# Patient Record
Sex: Female | Born: 1935
Health system: Southern US, Community
[De-identification: ages and names within clinical notes are randomized; demographics above are authoritative.]

## PROBLEM LIST (undated history)

## (undated) DIAGNOSIS — F0390 Unspecified dementia without behavioral disturbance: Secondary | ICD-10-CM

## (undated) DIAGNOSIS — I219 Acute myocardial infarction, unspecified: Secondary | ICD-10-CM

## (undated) DIAGNOSIS — I10 Essential (primary) hypertension: Secondary | ICD-10-CM

## (undated) DIAGNOSIS — I2510 Atherosclerotic heart disease of native coronary artery without angina pectoris: Secondary | ICD-10-CM

## (undated) DIAGNOSIS — E0590 Thyrotoxicosis, unspecified without thyrotoxic crisis or storm: Secondary | ICD-10-CM

## (undated) DIAGNOSIS — E079 Disorder of thyroid, unspecified: Secondary | ICD-10-CM

## (undated) DIAGNOSIS — I509 Heart failure, unspecified: Secondary | ICD-10-CM

## (undated) HISTORY — PX: PARTIAL HYSTERECTOMY: SHX80

## (undated) HISTORY — DX: Essential (primary) hypertension: I10

## (undated) HISTORY — DX: Unspecified dementia, unspecified severity, without behavioral disturbance, psychotic disturbance, mood disturbance, and anxiety: F03.90

## (undated) HISTORY — PX: CHOLECYSTECTOMY: SHX55

## (undated) HISTORY — DX: Acute myocardial infarction, unspecified: I21.9

## (undated) HISTORY — DX: Heart failure, unspecified: I50.9

## (undated) HISTORY — DX: Atherosclerotic heart disease of native coronary artery without angina pectoris: I25.10

## (undated) HISTORY — DX: Disorder of thyroid, unspecified: E07.9

---

## 2006-07-29 ENCOUNTER — Ambulatory Visit: Payer: Self-pay | Admitting: Internal Medicine

## 2007-12-16 ENCOUNTER — Ambulatory Visit: Payer: Self-pay | Admitting: Family Medicine

## 2008-03-21 ENCOUNTER — Ambulatory Visit: Payer: Self-pay | Admitting: Internal Medicine

## 2014-02-06 ENCOUNTER — Inpatient Hospital Stay: Payer: Self-pay | Admitting: Internal Medicine

## 2014-02-06 DIAGNOSIS — I340 Nonrheumatic mitral (valve) insufficiency: Secondary | ICD-10-CM

## 2014-02-06 LAB — PRO B NATRIURETIC PEPTIDE: B-Type Natriuretic Peptide: 5973 pg/mL — ABNORMAL HIGH

## 2014-02-06 LAB — CK-MB
CK-MB: 3.7 ng/mL — ABNORMAL HIGH (ref 0.5–3.6)
CK-MB: 4 ng/mL — ABNORMAL HIGH (ref 0.5–3.6)

## 2014-02-06 LAB — CBC
HCT: 38 % (ref 35.0–47.0)
HGB: 12.6 g/dL (ref 12.0–16.0)
MCH: 28.5 pg (ref 26.0–34.0)
MCHC: 33.2 g/dL (ref 32.0–36.0)
MCV: 86 fL (ref 80–100)
Platelet: 175 10*3/uL (ref 150–440)
RBC: 4.43 10*6/uL (ref 3.80–5.20)
RDW: 14.3 % (ref 11.5–14.5)
WBC: 8.5 10*3/uL (ref 3.6–11.0)

## 2014-02-06 LAB — COMPREHENSIVE METABOLIC PANEL
ALBUMIN: 3.2 g/dL — AB (ref 3.4–5.0)
ALK PHOS: 124 U/L — AB (ref 46–116)
AST: 39 U/L — AB (ref 15–37)
Anion Gap: 8 (ref 7–16)
BILIRUBIN TOTAL: 0.3 mg/dL (ref 0.2–1.0)
BUN: 11 mg/dL (ref 7–18)
CO2: 27 mmol/L (ref 21–32)
Calcium, Total: 8.9 mg/dL (ref 8.5–10.1)
Chloride: 108 mmol/L — ABNORMAL HIGH (ref 98–107)
Creatinine: 1.08 mg/dL (ref 0.60–1.30)
EGFR (African American): >60
GFR CALC NON AF AMER: 52 — AB
GLUCOSE: 200 mg/dL — AB (ref 65–99)
Osmolality: 290 (ref 275–301)
Potassium: 3 mmol/L — ABNORMAL LOW (ref 3.5–5.1)
SGPT (ALT): 40 U/L (ref 14–63)
Sodium: 143 mmol/L (ref 136–145)
Total Protein: 7 g/dL (ref 6.4–8.2)

## 2014-02-06 LAB — TROPONIN I
Troponin-I: 0.13 ng/mL — ABNORMAL HIGH
Troponin-I: 0.44 ng/mL — ABNORMAL HIGH
Troponin-I: 0.46 ng/mL — ABNORMAL HIGH

## 2014-02-06 LAB — CK TOTAL AND CKMB (NOT AT ARMC)
CK, Total: 59 U/L (ref 26–192)
CK-MB: 1.9 ng/mL (ref 0.5–3.6)

## 2014-02-06 LAB — TSH: Thyroid Stimulating Horm: <0.01 u[IU]/mL — ABNORMAL LOW

## 2014-02-07 LAB — BASIC METABOLIC PANEL
Anion Gap: 6 — ABNORMAL LOW (ref 7–16)
BUN: 20 mg/dL — ABNORMAL HIGH (ref 7–18)
CALCIUM: 9.3 mg/dL (ref 8.5–10.1)
CO2: 27 mmol/L (ref 21–32)
Chloride: 108 mmol/L — ABNORMAL HIGH (ref 98–107)
Creatinine: 0.92 mg/dL (ref 0.60–1.30)
EGFR (African American): >60
GLUCOSE: 91 mg/dL (ref 65–99)
OSMOLALITY: 283 (ref 275–301)
Potassium: 3.6 mmol/L (ref 3.5–5.1)
SODIUM: 141 mmol/L (ref 136–145)

## 2014-02-07 LAB — MAGNESIUM: MAGNESIUM: 1.8 mg/dL

## 2014-02-08 LAB — CBC WITH DIFFERENTIAL/PLATELET
Basophil #: 0.1 10*3/uL (ref 0.0–0.1)
Basophil %: 0.8 %
Eosinophil #: 0.2 10*3/uL (ref 0.0–0.7)
Eosinophil %: 2.7 %
HCT: 37.2 % (ref 35.0–47.0)
HGB: 12.2 g/dL (ref 12.0–16.0)
LYMPHS ABS: 2.5 10*3/uL (ref 1.0–3.6)
LYMPHS PCT: 34 %
MCH: 28.2 pg (ref 26.0–34.0)
MCHC: 32.7 g/dL (ref 32.0–36.0)
MCV: 86 fL (ref 80–100)
MONO ABS: 0.8 x10 3/mm (ref 0.2–0.9)
MONOS PCT: 11.3 %
NEUTROS ABS: 3.8 10*3/uL (ref 1.4–6.5)
Neutrophil %: 51.2 %
PLATELETS: 151 10*3/uL (ref 150–440)
RBC: 4.31 10*6/uL (ref 3.80–5.20)
RDW: 14.2 % (ref 11.5–14.5)
WBC: 7.4 10*3/uL (ref 3.6–11.0)

## 2014-02-08 LAB — BASIC METABOLIC PANEL
ANION GAP: 8 (ref 7–16)
BUN: 20 mg/dL — AB (ref 7–18)
CHLORIDE: 107 mmol/L (ref 98–107)
CREATININE: 0.84 mg/dL (ref 0.60–1.30)
Calcium, Total: 9.1 mg/dL (ref 8.5–10.1)
Co2: 28 mmol/L (ref 21–32)
EGFR (African American): >60
EGFR (Non-African Amer.): >60
GLUCOSE: 95 mg/dL (ref 65–99)
OSMOLALITY: 287 (ref 275–301)
Potassium: 3.5 mmol/L (ref 3.5–5.1)
Sodium: 143 mmol/L (ref 136–145)

## 2014-02-08 LAB — LIPID PANEL
CHOLESTEROL: 155 mg/dL (ref 0–200)
HDL: 52 mg/dL (ref 40–60)
LDL CHOLESTEROL, CALC: 89 mg/dL (ref 0–100)
TRIGLYCERIDES: 69 mg/dL (ref 0–200)
VLDL Cholesterol, Calc: 14 mg/dL (ref 5–40)

## 2014-02-08 LAB — PROTIME-INR
INR: 1
PROTHROMBIN TIME: 13.4 s (ref 11.5–14.7)

## 2014-02-08 LAB — T4, FREE: FREE THYROXINE: 2.58 ng/dL — AB (ref 0.76–1.46)

## 2014-02-11 LAB — CBC WITH DIFFERENTIAL/PLATELET
BASOS ABS: 0.2 10*3/uL — AB (ref 0.0–0.1)
Basophil %: 1.9 %
EOS ABS: 0.1 10*3/uL (ref 0.0–0.7)
Eosinophil %: 1.2 %
HCT: 37.8 % (ref 35.0–47.0)
HGB: 12.6 g/dL (ref 12.0–16.0)
LYMPHS ABS: 1 10*3/uL (ref 1.0–3.6)
Lymphocyte %: 10.5 %
MCH: 28.7 pg (ref 26.0–34.0)
MCHC: 33.5 g/dL (ref 32.0–36.0)
MCV: 86 fL (ref 80–100)
Monocyte #: 0.7 x10 3/mm (ref 0.2–0.9)
Monocyte %: 7.6 %
NEUTROS ABS: 7.5 10*3/uL — AB (ref 1.4–6.5)
Neutrophil %: 78.8 %
PLATELETS: 155 10*3/uL (ref 150–440)
RBC: 4.41 10*6/uL (ref 3.80–5.20)
RDW: 13.9 % (ref 11.5–14.5)
WBC: 9.5 10*3/uL (ref 3.6–11.0)

## 2014-02-11 LAB — COMPREHENSIVE METABOLIC PANEL
ALBUMIN: 2.9 g/dL — AB (ref 3.4–5.0)
ALK PHOS: 105 U/L (ref 46–116)
Anion Gap: 7 (ref 7–16)
BUN: 16 mg/dL (ref 7–18)
Bilirubin,Total: 0.8 mg/dL (ref 0.2–1.0)
CALCIUM: 9.4 mg/dL (ref 8.5–10.1)
CREATININE: 0.88 mg/dL (ref 0.60–1.30)
Chloride: 101 mmol/L (ref 98–107)
Co2: 31 mmol/L (ref 21–32)
EGFR (African American): >60
EGFR (Non-African Amer.): >60
Glucose: 103 mg/dL — ABNORMAL HIGH (ref 65–99)
OSMOLALITY: 279 (ref 275–301)
Potassium: 4.3 mmol/L (ref 3.5–5.1)
SGOT(AST): 26 U/L (ref 15–37)
SGPT (ALT): 23 U/L (ref 14–63)
SODIUM: 139 mmol/L (ref 136–145)
Total Protein: 7.2 g/dL (ref 6.4–8.2)

## 2014-02-11 LAB — URINALYSIS, COMPLETE
BLOOD: NEGATIVE
Bilirubin,UR: NEGATIVE
Glucose,UR: NEGATIVE mg/dL (ref 0–75)
KETONE: NEGATIVE
Leukocyte Esterase: NEGATIVE
Nitrite: NEGATIVE
Ph: 6 (ref 4.5–8.0)
Protein: NEGATIVE
RBC,UR: 1 /HPF (ref 0–5)
SPECIFIC GRAVITY: 1.009 (ref 1.003–1.030)
WBC UR: NONE SEEN /HPF (ref 0–5)

## 2014-02-16 LAB — CULTURE, BLOOD (SINGLE)

## 2014-02-22 ENCOUNTER — Ambulatory Visit: Payer: Self-pay | Admitting: Family

## 2014-03-23 ENCOUNTER — Ambulatory Visit: Payer: Self-pay | Admitting: Family

## 2014-04-19 DIAGNOSIS — I5022 Chronic systolic (congestive) heart failure: Secondary | ICD-10-CM

## 2014-04-19 DIAGNOSIS — R001 Bradycardia, unspecified: Secondary | ICD-10-CM | POA: Insufficient documentation

## 2014-04-19 DIAGNOSIS — I10 Essential (primary) hypertension: Secondary | ICD-10-CM

## 2014-05-13 NOTE — Consult Note (Signed)
PATIENT NAME:  Melanie Lewis, Melanie N MR#:  [id number] DATE OF BIRTH:  1934/05/31  DATE OF CONSULTATION:  02/08/2014  REFERRING PHYSICIAN:  Katha HammingSnehalatha Konidena, MD.   CONSULTING PHYSICIAN:  A. Wendall MolaMelissa Solum, MD PRIMARY CARE PHYSICIAN: Toy CookeyErnest Eason, MD.    ENDOCRINOLOGY CONSULTATION  CHIEF COMPLAINT: Hyperthyroidism.   HISTORY OF PRESENT ILLNESS: This is a 80 year old female with history of hypertension who was admitted on January 26 after presenting to the ED with complaint of shortness of breath. The patient found to be tachypneic and tachycardic with heart rate in the 140s. EKG showed rapid atrial fibrillation, chest x-ray showed evidence of congestive heart failure, and laboratories notable for elevated cardiac enzymes, but no ST elevation, consistent with a non-ST elevation MI. Cardiac echocardiogram showed decreased ejection fraction of 25% with wall motion abnormalities. Cardiology has been consulted. She underwent cardiac catheterization earlier today which showed normal coronaries. Heart rate now controlled and she is in sinus rhythm on beta blockers.   She denies any known history of thyroid disease. Denies any known family history of thyroid disease. Does give a history of weight loss last summer and fall of 25-50 pounds. State weight may have increased since October when she stopped working. Reports heat intolerance. Denies neck pain. She does report dysphagia to solids, this has been ongoing for greater than 6 months. Denies tremor. No appreciable palpitations. No chest pain prior to admission. Denies recent use of amiodarone or lithium. No exposure to iodinated contrast dye until earlier today when she underwent her cardiac catheterization.   PAST MEDICAL HISTORY:  1.  Hypertension.  2.  Arrhythmia-reports her PCP treated for irregular heart rhythm in the past with some sort of medication, details not known and that medicine was stopped last year.   SOCIAL HISTORY: The patient denies  alcohol or tobacco use.   ALLERGIES: No known drug allergies.   FAMILY HISTORY: No known thyroid disease. No known cardiovascular disease.   CURRENT MEDICATIONS:  1. Lisinopril 10 mg daily.  2. Lopressor 50 mg q. 12 hours.  3. Aricept 5 mg at bedtime.  4. Potassium chloride 20 mEq daily.  5. Demadex 20 mg daily.  6. Aspirin 81 mg daily.  7. Heparin 5000 units subcutaneous q. 8 hours.  8. Normal saline 0.9% at 100 mL/hour.   REVIEW OF SYSTEMS:    GENERAL: Weight loss as per HPI. No fever.  HEENT: No blurred vision. No sore throat.  NECK: She reports dysphagia to solids as per HPI. No neck pain.  CARDIAC: No chest pain. Denies palpitation.  PULMONARY: No cough. She does have dyspnea on exertion.  ABDOMEN: Denies nausea, vomiting. Reports appetite is fair.  EXTREMITIES: Denies lower extremity swelling.  SKIN: Denies rash or recent skin changes  ENDOCRINE. No cold intolerance.  GENITOURINARY: No dysuria or hematuria.  HEMATOLOGIC: No easy bruisability or recent bleeding.  NEUROLOGIC: No headache or falls.   PHYSICAL EXAMINATION:  VITAL SIGNS: Height 63.9 inches, weight 201 pounds. Temperature 97.9, pulse 65, respirations 23-30,  blood pressure 158/71, O2 100% on 2 liters by nasal cannula.  GENERAL: African-American female in no acute distress.  HEENT: No proptosis, lid lag, or stare.  OROPHARYNX: Clear. Mucous membranes moist.  NECK: Supple. No appreciable thyroid nodules. No neck pain. No thyromegaly.  CARDIAC: Regular rate and rhythm without murmur.  PULMONARY: Clear to auscultation bilaterally. No wheeze.  ABDOMEN: Diffusely soft, nontender, nondistended.  EXTREMITIES: No peripheral edema noted.  PSYCHIATRIC:  Calm, cooperative.  NEUROLOGIC: Alert, oriented. No tremor of  outstretched hands.   LABORATORY DATA: Glucose 95, BUN 20, creatinine 0.84, sodium 143, potassium 3.5, bicarbonate 28, chloride 107, calcium 9.1. HDL 52, total cholesterol 161, LDL 69.  02/06/2014 TSH less  than 0.010, total T4 of 15.8 ug/dL (reference range 0.9-60).  02/07/2014 thyroid peroxidase antibodies elevated at 103. Thyroglobulin antibody is undetectable.   ASSESSMENT:  1.  Hyperthyroidism-causes include likely Graves disease given pattern of thyroid blood test and elevated thyroid peroxidase antibodies, suggesting underlying autoimmune thyroid disease. Less likely causes include toxic multinodular goiter or silent thyroiditis.  2.  History of atrial fibrillation.  3.  Congestive heart failure.  4.  History of non-ST elevation myocardial infarction.   PLAN:  1.  We will obtain a free T4 and thyrotropin receptor antibody level. Free T4 can be helpful to follow and guide therapy and treatment of hyperthyroidism. Thyrotropin receptor antibody tends to be elevated and is more specific for Graves disease than TPO antibodies.  2.  We will obtain a neck ultrasound given her complaint of dysphagia and to assess for thyroid nodules.  3.  Plan to start methimazole. Will give 20 mg daily now. I anticipate tapering dose in 3-4 weeks. 4.  Will request outpatient records from PCP, as it would be helpful to see if she has had recent thyroid laboratories done.  5.  Avoid additional doses of iodinated contrast dye, if possible.  6.  Anticipate clinic followup in 2-4 weeks.   Thank you for the kind request for consultation. I will follow along with you.    ____________________________ A. Wendall Mola, MD ams:bu D: 02/08/2014 13:36:17 ET T: 02/08/2014 13:58:44 ET JOB#: [id number]  cc: A. Wendall Mola, MD, <Dictator> Serita Sheller. Maryellen Pile, MD Macy Mis MD ELECTRONICALLY SIGNED 02/14/2014 13:09

## 2014-05-13 NOTE — Discharge Summary (Signed)
PATIENT NAME:  Melanie Lewis, Melanie Lewis MR#:  [id number] DATE OF BIRTH:  1934/11/15  DATE OF ADMISSION:  02/06/2014 DATE OF DISCHARGE:  02/12/2014  PRIMARY CARE PHYSICIAN: Alden Server B. Maryellen Pile, MD   ADMISSION DIAGNOSIS: Acute on chronic systolic heart failure with respiratory distress.   DISCHARGE DIAGNOSES:  1.  Hyperthyroidism.  2.  Acute hypoxic respiratory failure, resolved.  3.  Acute systolic congestive heart failure with ejection fraction of 25%.  4.  Acute non-ST elevation myocardial infarction.  5.  Hypertension.  6.  Low-grade fever due to hyperthyroidism.  7.  Possible mild cognitive defect or dementia.  8.  Mild pulmonary hypertension.  CONSULTATIONS:  1.  Lamar Blinks, MD, cardiology.  2.  Macy Mis, MD, endocrinology.  PROCEDURES:  1.  Cardiac catheterization January 28 performed by Dr. Arnoldo Hooker shows moderate global left ventricular dysfunction, EF of 30%. Mild elevation in pulmonary pressures. Normal coronary arteries with 0% stenosis.  2.  Chest x-ray January 26 shows vascular congestion, mild cardiomegaly with bibasilar airspace opacities concerning for pulmonary edema.  3.  Chest x-ray January 31 shows improvement in congestive heart failure.   HISTORY OF PRESENT ILLNESS: For further details, please see the H and P dictated by Dr. Sheryle Hail on January 26. This 80 year old African American female presents to the Emergency Room with complaint of shortness of breath, which occurred suddenly as she was sitting still on her sofa watching television. On presentation to the Emergency Room, she was tachypneic with a respiratory rate of 35. Chest x-ray showed pulmonary edema. She was admitted for treatment of CHF and further evaluation. She was initially placed on nitroglycerin drip and BiPAP and given 80 mg of Lasix IV.   HOSPITAL COURSE BY PROBLEM:  1.  Acute non-ST elevation MI: The patient did have elevated troponins without EKG change. She was taken for cardiac  catheterization due to elevated troponins as well as due to new diagnosis of congestive heart failure. She was found to have no obstructing coronary artery disease.  2.  Acute systolic congestive heart failure with ejection fraction of 25% to 30%: This was seen on 2-D echocardiogram and confirmed on cardiac catheterization. This is likely due to hyperthyroidism. Cardiac catheterization did not show any signs of ischemic disease. She was started on a beta blocker, ACE inhibitor, and aspirin. She responded well to Lasix for diuresis with improved pulmonary and lower extremity edema.  3.  Hyperthyroidism: The patient was seen by endocrinology in hospital. This is likely Graves disease, given elevated thyroid peroxidase antibodies. She was started on methimazole. She will follow up with endocrinology in 2-4 weeks after discharge. It is likely that her hyperthyroidism is the cause of her congestive heart failure, low-grade fevers, and the presenting tachycardia. Her thyroid ultrasound showed an isthmus mass. She needs fine-needle aspiration and further evaluation in the outpatient setting.  4.  Hypertension: This was controlled during the admission with lisinopril, atenolol, torsemide. This will need to be followed by her primary care physician.  5.  Low-grade fever: Towards the end of her hospitalization, the patient had several episodes of fevers in the 99 to 100.8 range. Blood cultures, urine cultures, chest x-ray were all negative for any signs of infection. She never felt ill or had any specific symptoms. This low-grade fever was attributed to her hyperthyroidism. She was not started on any antibiotics. She did not have a leukocytosis associated with this fever.  DISCHARGE PHYSICAL EXAMINATION:  VITAL SIGNS: Temperature 98.2, pulse 72, respirations 20, blood  pressure 162/71, oxygenation 98% on room air.  GENERAL: No acute distress.  CARDIOVASCULAR: Regular rate and rhythm. No murmurs, rubs, or gallops.  Trace peripheral edema. Peripheral pulses are 1+.  RESPIRATORY: Lungs clear to auscultation bilaterally with good air movement.  ABDOMEN: Soft, nontender, nondistended.  PSYCHIATRIC: Alert and oriented with fair insight into her clinical condition.   LABORATORY DATA: Sodium 139, potassium 4.3, chloride 101, bicarbonate 31, BUN 16, creatinine 0.88, glucose 103. Total cholesterol 155, HDL 52, LDL 89. LFTs normal. Cardiac enzymes 0.13, 0.44, 0.46. TSH less than 0.01. Free T4 of 2.58. White blood cells 9.5, hemoglobin 12.6, platelets 155,000, MCV 86. INR 1.1. Blood cultures no growth. UA with no signs of infection. Thyroid antibodies positive.   DISCHARGE MEDICATIONS:  1.  Donepezil 5 mg 1 tablet once a day.  2.  Atenolol 100 mg 1 tablet once a day.  3.  Potassium chloride 20 mEq 1 tablet once a day.  4.  Torsemide 20 mg 1 tablet once a day.  5.  Lisinopril 20 mg 1 tablet once a day.  6.  Aspirin 81 mg 1 tablet once a day.  7.  Methimazole 10 mg 2 tablets once a day.   CONDITION: Stable.   DISPOSITION: Discharged to home with no further home health needs.   DISCHARGE INSTRUCTIONS:   DIET: Low-sodium, low-fat, low-cholesterol diet.   ACTIVITY LIMITATIONS: None.   TIMEFRAME FOR FOLLOWUP: The patient will follow up with her primary care provider in the next 1-2 weeks. She will also follow up with Dr. Tedd SiasSolum on March 4; primary care with Dr. Maryellen PileEason on February 15; [facility] February 11.   TIME SPENT ON DISCHARGE: Forty minutes.   ____________________________ Ena Dawleyatherine P. Clent RidgesWalsh, MD cpw:TT D: 02/15/2014 20:51:18 ET T: 02/15/2014 22:41:06 ET JOB#: [id number]  cc: Ena Dawleyatherine P. Clent RidgesWalsh, MD, <Dictator> Serita ShellerErnest B. Maryellen PileEason, MD A. Wendall MolaMelissa Solum, MD  Gale JourneyATHERINE P Marwan Lipe MD ELECTRONICALLY SIGNED 02/21/2014 18:26

## 2014-05-13 NOTE — H&P (Signed)
PATIENT NAME:  Melanie Lewis, Melanie Lewis MR#:  [id number] DATE OF BIRTH:  1934/10/13  DATE OF ADMISSION:  02/06/2014  REFERRING PHYSICIAN:  Coolidge Breeze, MD   PRIMARY CARE PHYSICIAN:  Serita Sheller. Maryellen Pile, MD   ADMISSION DIAGNOSIS:  Acute-on-chronic systolic heart failure and respiratory distress.   HISTORY OF PRESENT ILLNESS:  This is a 80 year old African-American female who presents to the Emergency Department via EMS complaining of shortness of breath. The patient states that it started as she was sitting still on her couch watching TV. Notably, she had just finished dinner approximately 45 minutes before. Dinner consisted of a canned sandwich meat mix and a soda. Also, the patient states that her feet have been swelling a lot more for the last few days. At the onset of shortness of breath, she denies any chest pain, nausea, vomiting, diaphoresis, or lightheadedness. She admits to having episodes like this before that resolved very quickly, but this episode of shortness of breath did not go away, which prompted her to call EMS. In the Emergency Department, the patient was found to be very tachypneic to a rate of approximately 35. Chest x-ray showed pulmonary edema, and she was placed on a nitroglycerin drip and BiPAP. She was given Lasix 80 mg IV and responded quite well. By the time the Emergency Department had called for admission, she was breathing much more comfortably.   REVIEW OF SYSTEMS: CONSTITUTIONAL:  The patient denies fever or weakness.  EYES:  Denies blurred vision or inflammation.  EARS, NOSE, AND THROAT:  Denies tinnitus or sore throat.  RESPIRATORY:  Denies cough, but admits to shortness of breath.  CARDIOVASCULAR:  Denies chest pain, palpitations, orthopnea, or paroxysmal nocturnal dyspnea.  GASTROINTESTINAL:  Denies nausea, vomiting, diarrhea, or abdominal pain.  GENITOURINARY:  Denies dysuria or increased frequency or hesitancy of urination.  ENDOCRINE:  Denies polyuria or  polydipsia.  HEMATOLOGIC AND LYMPHATIC:  Denies easy bruising or bleeding.  INTEGUMENTARY:  Denies rashes or lesions.  MUSCULOSKELETAL:  Denies arthralgias or myalgias.  NEUROLOGIC:  Denies numbness in the extremities or dysarthria.  PSYCHIATRIC:  Denies depression or suicidal ideation.   PAST MEDICAL HISTORY:  Congestive heart failure and hypertension as well as possible mild cognitive deficit or dementia.   PAST SURGICAL HISTORY:  None.   SOCIAL HISTORY:  The patient lives at home with her daughter and granddaughter.   FAMILY HISTORY:  Significant for hypertension.   MEDICATIONS: 1.  Atenolol 100 mg 1 tablet p.o. daily.  2.  Donepezil 5 mg 1 tablet p.o. at bedtime.  3.  Potassium chloride 20 mEq extended-release tablet 1 tablet p.o. daily.  4.  Torsemide 20 mg 1 tablet p.o. daily.   ALLERGIES:  No known drug allergies.   PERTINENT LABORATORY RESULTS AND RADIOGRAPHIC FINDINGS:  Serum glucose is 200, BNP 5973, BUN 11, creatinine 1.08, serum sodium 143, potassium 3, serum chloride 108, bicarbonate 27, calcium 8.9, serum albumin 3.2, alkaline phosphatase 124, AST 39, and ALT is 40. Troponin is 0.13. White blood cell count is 8.5, hemoglobin 12.6, hematocrit 38, and platelet count is 175,000. MCV is 86.   Chest x-ray shows vascular congestion and mild cardiomegaly with bibasilar airspace opacities concerning for mild pulmonary edema.   PHYSICAL EXAMINATION: VITAL SIGNS:  Temperature is 98.5, pulse 137, respirations 20, blood pressure 183/114, and pulse oximetry is 97% on 2 liters of oxygen via nasal cannula.  GENERAL:  The patient is alert and oriented, in no apparent distress.  HEENT:  Normocephalic, atraumatic. Pupils  are equal, round, and reactive to light and accommodation. Extraocular movements are intact. Mucous membranes are moist.  NECK:  Trachea is midline. No adenopathy. Thyroid is nonpalpable and nontender.  CHEST:  Symmetric, atraumatic.  CARDIOVASCULAR:  Tachycardic rate,  normal rhythm. Normal S1, S2. No rubs, clicks, or murmurs appreciated.  LUNGS:  Clear to auscultation bilaterally, although there are some diminished breath sounds bilaterally at the bases.  ABDOMEN:  Positive bowel sounds. Soft, nontender, nondistended. No hepatosplenomegaly.  GENITOURINARY:  Deferred.  MUSCULOSKELETAL:  The patient moves all 4 extremities equally. I have not walked the patient, as she is already in respiratory distress lying in bed.  SKIN:  No rashes or lesions. Warm and dry. EXTREMITIES:  No clubbing or cyanosis. There is nonpitting edema up to the mid shin bilaterally.  NEUROLOGIC:  Cranial nerves II through XII are grossly intact.  PSYCHIATRIC:  Mood is normal. Affect is congruent. The patient has excellent insight and judgment into her medical condition.   ASSESSMENT AND PLAN:  This is a 80 year old female admitted for acute-on-chronic systolic heart failure.   1.  Exacerbation of systolic heart failure. This has resulted in pulmonary edema. In the Emergency Department, the patient received Lasix, which she responded very well. She was also placed on a nitroglycerin drip presumably to release some of her vascular congestion. At this time, I have discontinued the drip, as she is feeling much better. She admits that she is at least 2 pounds over her dry weight. I have scheduled a lower dose of Lasix for the morning, and the patient may resume her regular oral Lasix dosing per her home regimen later. Her initial troponin was elevated slightly, although I suspect this is secondary to demand ischemia. She has not had any chest pain, nor any EKG findings that are concerning for ischemia. I will continue to cycle her cardiac enzymes and keep the patient on telemetry.  2.  Respiratory distress. The patient was initially placed on bilevel positive airway pressure. Now, she is comfortably tachypneic and on nasal cannula. I expect her respiratory rate to decrease with continued diuresis.   3.  Hypertension, uncontrolled at this time. I have switched the patient's atenolol to metoprolol b.i.d. dosing, and I have added lisinopril. Her kidney function is within normal limits at this time, but we will continue to monitor her potassium and creatinine.  4.  Obesity. The patient's body mass index is 33. Obviously, this may not be completely accurate, as she has increased water weight at this time. Nonetheless, I have encouraged salt-restricted diet and exercise, as her fluid overload may have been caused by dietary factors such as canned foods and the associated high sodium content.  5.  Deep vein thrombosis prophylaxis with heparin.  6.  Gastrointestinal prophylaxis. None.   CODE STATUS:  The patient is a full code.   TIME SPENT ON ADMISSION ORDERS AND CRITICAL PATIENT CARE:  Approximately 40 minutes.   ____________________________ Kelton PillarMichael S. Sheryle Hailiamond, MD msd:nb D: 02/06/2014 04:35:17 ET T: 02/06/2014 06:09:00 ET JOB#: [id number]  cc: Kelton PillarMichael S. Sheryle Hailiamond, MD, <Dictator> Kelton PillarMICHAEL S DIAMOND MD ELECTRONICALLY SIGNED 02/07/2014 1:57

## 2014-05-13 NOTE — Consult Note (Signed)
PATIENT NAME:  Melanie Lewis, Melanie N MR#:  [id number] DATE OF BIRTH:  1934/12/20  DATE OF CONSULTATION:  02/06/2014  REFERRING PHYSICIAN:   CONSULTING PHYSICIAN:  Lamar BlinksBruce J. Oluwateniola Leitch, MD  CONSULTING PHYSICIAN:  Dr. Sheryle Hailiamond.   REASON FOR CONSULTATION: Atrial fibrillation with rapid ventricular rate, elevated troponin, and acute heart failure.   CHIEF COMPLAINT: "I got short of breath."   HISTORY OF PRESENT ILLNESS: This is a 80 year old female with known essential hypertension and some rhythm disturbance in the past, who has had new onset of shortness of breath, lower extremity edema, and pulmonary edema with hypoxia over the last 3-4 days, increasing with physical activity and relieved by rest, not associated with chest pain. The patient was seen in the Emergency Room with an elevated BNP of 5913 as well as an EKG showing atrial fibrillation with rapid ventricular rate and nonspecific ST changes. The patient also had an elevated troponin of 0.46 consistent with demand ischemia and hypoxia. After intravenous Lasix the patient does have some improvements of her symptoms. Currently she was placed on atenolol for heart rate and blood pressure control and now has spontaneously converted back to normal sinus rhythm. The patient has had no evidence of chest tightness or chest pain.   REVIEW OF SYSTEMS: The remainder of review of systems is negative for vision change, ringing in the ears, hearing loss, cough, congestion, heartburn, nausea, vomiting, diarrhea, bloody stools, stomach pain, extremity pain, leg weakness, cramping of the buttocks, known blood clots, headaches, blackouts, dizzy spells, nosebleeds, congestion, trouble swallowing, frequent urination, urination at night, muscle weakness, numbness, anxiety, depression, skin lesions or skin rashes.   PAST MEDICAL HISTORY:  1.  Rhythm disturbances including atrial fibrillation.  2.  Essential hypertension.   FAMILY HISTORY: The patient has family history  of hypertension, but no evidence of congestive heart failure and/or coronary artery disease.   SOCIAL HISTORY: Currently denies alcohol or tobacco use.   ALLERGIES: AS LISTED.   MEDICATIONS: As listed.   PHYSICAL EXAMINATION:  VITAL SIGNS: Blood pressure is 138/62 bilaterally, heart rate is 68 upright, reclining, and regular.  GENERAL: She is a well-appearing female in no acute distress.  HEENT: No icterus, thyromegaly, ulcers, hemorrhage, or xanthelasma.  CARDIOVASCULAR: Regular rate and rhythm. Normal S1 and S2. 2 out of 6 apical murmur consistent with mitral agitation. PMI is inferiorly displaced. Carotid upstroke normal without bruit. Jugular venous pressure is slightly elevated.  LUNGS: Have bibasilar crackles with normal respirations.  ABDOMEN: Soft, nontender, without hepatosplenomegaly or masses. Abdominal aorta is normal size without bruit.  EXTREMITIES: 2 + radial, femoral, trace dorsal pedal pulses with trace lower extremity edema. No cyanosis, clubbing, or ulcers.  NEUROLOGIC: She is oriented to time, place, and person, with normal mood and affect.   ASSESSMENT: A 80 year old female with acute congestive heart failure, multifactorial in nature including atrial fibrillation with rapid ventricular rate and/or possible systolic or diastolic dysfunction with elevated troponin consistent with demand ischemia rather than acute coronary syndrome, needing further treatment options.   RECOMMENDATIONS:  1.  Atenolol for heart rate control, essential hypertension, and maintenance of normal sinus rhythm.  2.  No additional heparin for anticoagulation at this time due to rare episodes of atrial fibrillation now converted to normal rhythm.  3.  Echocardiogram for LV systolic dysfunction, valvular heart disease contributing to above with adjustments of medications.  4.  Intravenous Lasix and change to oral Lasix when able for pulmonary edema.  5.  Begin ambulation with adjustments of  medications thereafter with other diagnostics necessary as patient ambulates.      ____________________________ Lamar Blinks, MD bjk:bu D: 02/06/2014 13:12:04 ET T: 02/06/2014 13:32:18 ET JOB#: [id number]  cc: Lamar Blinks, MD, <Dictator> Lamar Blinks MD ELECTRONICALLY SIGNED 02/09/2014 8:37

## 2014-05-13 NOTE — Consult Note (Signed)
Chief Complaint and History:  Referring Physician Dr. Luberta MutterKonidena   Allergies:  No Known Allergies:   Assessment/Plan:  Assessment/Plan 80 yo AAF with HTN admitted with rapid a.fib, acute MI and CHF found to have hyperthyroidism. S/p cath today with clean coronaries. HR now controlled and she is back in SR. Denies known h/o thyroid disease. She does report dysphagia to solids. No goiter or exopthalmos or palpable thyroid nodules on exam.  Pt examined, interviewed, and chart reviewed.  A/ Hyperthyroidism - likely due to Grave's disease and alternative is a toxic MNG or silent thyroiditis  P/ Will check free T4 and thyrotropin receptor Ab (tends to be high in Grave's) Will obtain thyroid US. Will likely start MMI.  Full consult to be dictated.   Electronic Signatures: Raj JanusSolum, Naly Schwanz M (MD)  (Signed 28-Jan-16 13:27)  Authored: Chief Complaint and History, ALLERGIES, Assessment/Plan   Last Updated: 28-Jan-16 13:27 by Raj JanusSolum, Zavia Pullen M (MD)

## 2014-05-25 ENCOUNTER — Ambulatory Visit: Payer: Medicare Other | Attending: Family | Admitting: Family

## 2014-05-25 ENCOUNTER — Encounter: Payer: Self-pay | Admitting: Family

## 2014-05-25 VITALS — BP 157/77 | HR 68 | Resp 20 | Ht 62.0 in | Wt 193.0 lb

## 2014-05-25 DIAGNOSIS — R001 Bradycardia, unspecified: Secondary | ICD-10-CM | POA: Insufficient documentation

## 2014-05-25 DIAGNOSIS — I509 Heart failure, unspecified: Secondary | ICD-10-CM | POA: Diagnosis not present

## 2014-05-25 DIAGNOSIS — M542 Cervicalgia: Secondary | ICD-10-CM | POA: Insufficient documentation

## 2014-05-25 DIAGNOSIS — I10 Essential (primary) hypertension: Secondary | ICD-10-CM | POA: Diagnosis not present

## 2014-05-25 DIAGNOSIS — E0590 Thyrotoxicosis, unspecified without thyrotoxic crisis or storm: Secondary | ICD-10-CM | POA: Diagnosis not present

## 2014-05-25 DIAGNOSIS — I5022 Chronic systolic (congestive) heart failure: Secondary | ICD-10-CM

## 2014-05-25 MED ORDER — SACUBITRIL-VALSARTAN 24-26 MG PO TABS: 1.0000 | ORAL_TABLET | Freq: Two times a day (BID) | ORAL | Status: DC

## 2014-05-25 NOTE — Patient Instructions (Signed)
Finish lisinopril and then begin entresto twice daily.

## 2014-05-25 NOTE — Progress Notes (Signed)
Subjective:    Patient ID: Melanie Lewis, female    DOB: [birth year], 80 y.o.   MRN: [id number]  Congestive Heart Failure Presents for follow-up visit. The disease course has been stable. Associated symptoms include edema and fatigue. Pertinent negatives include no abdominal pain, chest pain, orthopnea, palpitations, paroxysmal nocturnal dyspnea or shortness of breath. The symptoms have been improving. Past treatments include ACE inhibitors, beta blockers and salt and fluid restriction. The treatment provided moderate relief. Compliance with prior treatments has been good. Her past medical history is significant for HTN and hyperthyroidism.  Neck Pain  This is a recurrent problem. The current episode started 1 to 4 weeks ago. The problem occurs intermittently. The problem has been gradually improving. The pain is associated with an unknown factor. The pain is present in the left side. The quality of the pain is described as aching. The pain is at a severity of 1/10. The pain is mild. The symptoms are aggravated by twisting. Stiffness is present in the morning. Pertinent negatives include no chest pain, headaches, pain with swallowing, trouble swallowing or weakness. She has tried nothing for the symptoms.      Review of Systems  Constitutional: Positive for fatigue. Negative for appetite change.  HENT: Negative for sore throat and trouble swallowing.   Eyes: Negative.   Respiratory: Negative for cough, chest tightness and shortness of breath.   Cardiovascular: Positive for leg swelling. Negative for chest pain and palpitations.  Gastrointestinal: Negative for abdominal pain and abdominal distention.  Endocrine: Negative.   Genitourinary: Negative.   Musculoskeletal: Positive for arthralgias (left shoulder) and neck pain.  Skin: Negative.   Neurological: Negative for dizziness, weakness and headaches.  Hematological: Negative.   Psychiatric/Behavioral: Negative for confusion. The  patient is not nervous/anxious.        Objective:   Physical Exam  Constitutional: She is oriented to person, place, and time. She appears well-developed and well-nourished.  HENT:  Head: Normocephalic and atraumatic.  Eyes: Conjunctivae are normal. Pupils are equal, round, and reactive to light.  Neck: Normal range of motion. Neck supple.  Cardiovascular: Normal rate and regular rhythm.   Pulmonary/Chest: Effort normal and breath sounds normal.  Abdominal: Soft. There is no tenderness.  Musculoskeletal: She exhibits edema (1+ pitting edema bilateral ankles). She exhibits no tenderness.  Neurological: She is alert and oriented to person, place, and time.  Skin: Skin is warm and dry. No rash noted.  Psychiatric: She has a normal mood and affect. Her behavior is normal.  Nursing note and vitals reviewed.         Assessment & Plan:  1: Chronic heart failure with reduced ejection fraction- Patient presents with fatigue upon exertion but she feels like that is improving. She is sleeping well on 2 pillows. She denies any shortness of breath. She continues to weigh herself and she was reminded to call for an overnight weight gain of >2 pounds or a weekly weight gain of >5 pounds. She continues to have some edema in her ankles but does wear her TED hose daily. Encouraged her to elevate her legs when she is at home. She was changed to carvedilol at her last visit and has adjusted nicely to the medication. Will change her lisinopril to entresto. She was instructed to finish our her lisinopril and then begin entresto the following evening after finishing lisinopril and only take one entresto on day 1. Did provide written instructions for her to follow. Patient verbalizes understanding. Also gave patient  a card to get the first 30 days free. Did instruct to call for any swelling, burning, tingling around the mouth, lips or throat or if she experiences any side effects from it. 2: HTN- Blood pressure  improved from last visit. Could consider increasing carvedilol at future visits once entresto gets added. She continues to follow a low sodium diet. 3: Bradycardia- Heart rate has improved from last time.   Return in 5 weeks or sooner for any questions/problems before then.

## 2014-06-23 ENCOUNTER — Encounter: Payer: Self-pay | Admitting: Emergency Medicine

## 2014-06-23 ENCOUNTER — Emergency Department: Payer: Medicare Other

## 2014-06-23 ENCOUNTER — Inpatient Hospital Stay
Admission: EM | Admit: 2014-06-23 | Discharge: 2014-06-24 | DRG: 281 | Disposition: A | Discharge status: Home / Self Care | Payer: Medicare Other | Attending: Internal Medicine | Admitting: Internal Medicine

## 2014-06-23 ENCOUNTER — Inpatient Hospital Stay
Admit: 2014-06-23 | Discharge: 2014-06-23 | Disposition: A | Discharge status: Home / Self Care | Payer: Medicare Other | Attending: Internal Medicine | Admitting: Internal Medicine

## 2014-06-23 DIAGNOSIS — I129 Hypertensive chronic kidney disease with stage 1 through stage 4 chronic kidney disease, or unspecified chronic kidney disease: Secondary | ICD-10-CM | POA: Diagnosis present

## 2014-06-23 DIAGNOSIS — I429 Cardiomyopathy, unspecified: Secondary | ICD-10-CM | POA: Diagnosis present

## 2014-06-23 DIAGNOSIS — N179 Acute kidney failure, unspecified: Secondary | ICD-10-CM | POA: Diagnosis present

## 2014-06-23 DIAGNOSIS — Z7982 Long term (current) use of aspirin: Secondary | ICD-10-CM

## 2014-06-23 DIAGNOSIS — I214 Non-ST elevation (NSTEMI) myocardial infarction: Secondary | ICD-10-CM | POA: Diagnosis present

## 2014-06-23 DIAGNOSIS — N289 Disorder of kidney and ureter, unspecified: Secondary | ICD-10-CM

## 2014-06-23 DIAGNOSIS — I10 Essential (primary) hypertension: Secondary | ICD-10-CM | POA: Diagnosis present

## 2014-06-23 DIAGNOSIS — Z8249 Family history of ischemic heart disease and other diseases of the circulatory system: Secondary | ICD-10-CM

## 2014-06-23 DIAGNOSIS — I5022 Chronic systolic (congestive) heart failure: Secondary | ICD-10-CM | POA: Diagnosis present

## 2014-06-23 DIAGNOSIS — I471 Supraventricular tachycardia, unspecified: Secondary | ICD-10-CM | POA: Diagnosis present

## 2014-06-23 DIAGNOSIS — E0590 Thyrotoxicosis, unspecified without thyrotoxic crisis or storm: Secondary | ICD-10-CM | POA: Diagnosis present

## 2014-06-23 DIAGNOSIS — I2510 Atherosclerotic heart disease of native coronary artery without angina pectoris: Secondary | ICD-10-CM | POA: Diagnosis present

## 2014-06-23 DIAGNOSIS — F0390 Unspecified dementia without behavioral disturbance: Secondary | ICD-10-CM | POA: Diagnosis present

## 2014-06-23 DIAGNOSIS — I495 Sick sinus syndrome: Secondary | ICD-10-CM | POA: Diagnosis present

## 2014-06-23 DIAGNOSIS — Z79899 Other long term (current) drug therapy: Secondary | ICD-10-CM | POA: Diagnosis not present

## 2014-06-23 DIAGNOSIS — N189 Chronic kidney disease, unspecified: Secondary | ICD-10-CM | POA: Diagnosis present

## 2014-06-23 HISTORY — DX: Thyrotoxicosis, unspecified without thyrotoxic crisis or storm: E05.90

## 2014-06-23 LAB — COMPREHENSIVE METABOLIC PANEL
ALBUMIN: 3.4 g/dL — AB (ref 3.5–5.0)
ALT: 23 U/L (ref 14–54)
ANION GAP: 10 (ref 5–15)
AST: 33 U/L (ref 15–41)
Alkaline Phosphatase: 134 U/L — ABNORMAL HIGH (ref 38–126)
BUN: 20 mg/dL (ref 6–20)
CALCIUM: 8.8 mg/dL — AB (ref 8.9–10.3)
CO2: 25 mmol/L (ref 22–32)
Chloride: 105 mmol/L (ref 101–111)
Creatinine, Ser: 1.58 mg/dL — ABNORMAL HIGH (ref 0.44–1.00)
GFR calc non Af Amer: 30 mL/min — ABNORMAL LOW (ref >60)
GFR, EST AFRICAN AMERICAN: 35 mL/min — AB (ref >60)
Glucose, Bld: 185 mg/dL — ABNORMAL HIGH (ref 65–99)
Potassium: 4.2 mmol/L (ref 3.5–5.1)
Sodium: 140 mmol/L (ref 135–145)
Total Bilirubin: 0.6 mg/dL (ref 0.3–1.2)
Total Protein: 6.8 g/dL (ref 6.5–8.1)

## 2014-06-23 LAB — CBC WITH DIFFERENTIAL/PLATELET
BASOS ABS: 0.2 10*3/uL — AB (ref 0–0.1)
Basophils Relative: 2 %
EOS PCT: 1 %
Eosinophils Absolute: 0.1 10*3/uL (ref 0–0.7)
HEMATOCRIT: 38.1 % (ref 35.0–47.0)
HEMOGLOBIN: 12.8 g/dL (ref 12.0–16.0)
Lymphocytes Relative: 18 %
Lymphs Abs: 1.4 10*3/uL (ref 1.0–3.6)
MCH: 31.2 pg (ref 26.0–34.0)
MCHC: 33.6 g/dL (ref 32.0–36.0)
MCV: 92.8 fL (ref 80.0–100.0)
MONO ABS: 0.9 10*3/uL (ref 0.2–0.9)
Monocytes Relative: 11 %
NEUTROS PCT: 68 %
Neutro Abs: 5.5 10*3/uL (ref 1.4–6.5)
Platelets: 134 10*3/uL — ABNORMAL LOW (ref 150–440)
RBC: 4.11 MIL/uL (ref 3.80–5.20)
RDW: 16.2 % — AB (ref 11.5–14.5)
WBC: 8 10*3/uL (ref 3.6–11.0)

## 2014-06-23 LAB — URINALYSIS COMPLETE WITH MICROSCOPIC (ARMC ONLY)
Bilirubin Urine: NEGATIVE
GLUCOSE, UA: 50 mg/dL — AB
Hgb urine dipstick: NEGATIVE
KETONES UR: NEGATIVE mg/dL
LEUKOCYTES UA: NEGATIVE
Nitrite: NEGATIVE
PROTEIN: NEGATIVE mg/dL
Specific Gravity, Urine: 1.005 (ref 1.005–1.030)
pH: 7 (ref 5.0–8.0)

## 2014-06-23 LAB — T4, FREE: Free T4: 0.87 ng/dL (ref 0.61–1.12)

## 2014-06-23 LAB — TROPONIN I
TROPONIN I: 0.05 ng/mL — AB (ref <0.031)
TROPONIN I: 0.31 ng/mL — AB (ref <0.031)
Troponin I: 0.2 ng/mL — ABNORMAL HIGH (ref <0.031)
Troponin I: 0.18 ng/mL — ABNORMAL HIGH (ref <0.031)

## 2014-06-23 LAB — MAGNESIUM: Magnesium: 2.1 mg/dL (ref 1.7–2.4)

## 2014-06-23 LAB — TSH: TSH: 0.333 u[IU]/mL — ABNORMAL LOW (ref 0.350–4.500)

## 2014-06-23 MED ORDER — SODIUM CHLORIDE 0.9 % IJ SOLN
3.0000 mL | Freq: Two times a day (BID) | INTRAMUSCULAR | Status: DC
Start: 2014-06-23 — End: 2014-06-24
  Administered 2014-06-23: 3 mL via INTRAVENOUS

## 2014-06-23 MED ORDER — SODIUM CHLORIDE 0.9 % IV SOLN
Freq: Once | INTRAVENOUS | Status: AC
  Administered 2014-06-23: 15:00:00 via INTRAVENOUS

## 2014-06-23 MED ORDER — ASPIRIN 81 MG PO CHEW
324.0000 mg | CHEWABLE_TABLET | Freq: Once | ORAL | Status: AC
  Administered 2014-06-23: 324 mg via ORAL

## 2014-06-23 MED ORDER — ASPIRIN EC 81 MG PO TBEC
81.0000 mg | DELAYED_RELEASE_TABLET | Freq: Every day | ORAL | Status: DC
Start: 2014-06-23 — End: 2014-06-24
  Administered 2014-06-23 – 2014-06-24 (×2): 81 mg via ORAL
  Filled 2014-06-23 (×2): qty 1

## 2014-06-23 MED ORDER — ONDANSETRON HCL 4 MG/2ML IJ SOLN: 4.0000 mg | Freq: Four times a day (QID) | INTRAMUSCULAR | Status: DC | PRN

## 2014-06-23 MED ORDER — ACETAMINOPHEN 500 MG PO TABS
ORAL_TABLET | ORAL | Status: AC
  Administered 2014-06-23: 1000 mg via ORAL
  Filled 2014-06-23: qty 2

## 2014-06-23 MED ORDER — METHIMAZOLE 10 MG PO TABS
10.0000 mg | ORAL_TABLET | Freq: Two times a day (BID) | ORAL | Status: DC
  Administered 2014-06-23 – 2014-06-24 (×3): 10 mg via ORAL
  Filled 2014-06-23 (×5): qty 1

## 2014-06-23 MED ORDER — ACETAMINOPHEN 500 MG PO TABS
1000.0000 mg | ORAL_TABLET | Freq: Once | ORAL | Status: AC
  Administered 2014-06-23: 1000 mg via ORAL

## 2014-06-23 MED ORDER — POTASSIUM CHLORIDE CRYS ER 20 MEQ PO TBCR
20.0000 meq | EXTENDED_RELEASE_TABLET | Freq: Every day | ORAL | Status: DC
  Administered 2014-06-23 – 2014-06-24 (×2): 20 meq via ORAL
  Filled 2014-06-23 (×2): qty 1

## 2014-06-23 MED ORDER — HEPARIN SODIUM (PORCINE) 5000 UNIT/ML IJ SOLN
5000.0000 [IU] | Freq: Three times a day (TID) | INTRAMUSCULAR | Status: DC
  Administered 2014-06-23 – 2014-06-24 (×4): 5000 [IU] via SUBCUTANEOUS
  Filled 2014-06-23 (×4): qty 1

## 2014-06-23 MED ORDER — DONEPEZIL HCL 5 MG PO TABS
5.0000 mg | ORAL_TABLET | Freq: Every day | ORAL | Status: DC
  Administered 2014-06-23: 5 mg via ORAL
  Filled 2014-06-23: qty 1

## 2014-06-23 MED ORDER — ASPIRIN EC 81 MG PO TBEC: 81.0000 mg | DELAYED_RELEASE_TABLET | Freq: Every day | ORAL | Status: DC

## 2014-06-23 MED ORDER — ASPIRIN 81 MG PO CHEW
CHEWABLE_TABLET | ORAL | Status: AC
  Administered 2014-06-23: 324 mg via ORAL
  Filled 2014-06-23: qty 4

## 2014-06-23 MED ORDER — SODIUM CHLORIDE 0.9 % IV SOLN
INTRAVENOUS | Status: DC
  Administered 2014-06-23: 06:00:00 via INTRAVENOUS

## 2014-06-23 MED ORDER — ONDANSETRON HCL 4 MG PO TABS: 4.0000 mg | ORAL_TABLET | Freq: Four times a day (QID) | ORAL | Status: DC | PRN

## 2014-06-23 MED ORDER — SODIUM CHLORIDE 0.9 % IV BOLUS (SEPSIS)
250.0000 mL | Freq: Once | INTRAVENOUS | Status: AC
  Administered 2014-06-23: 250 mL via INTRAVENOUS

## 2014-06-23 MED ORDER — CARVEDILOL 6.25 MG PO TABS
6.2500 mg | ORAL_TABLET | Freq: Two times a day (BID) | ORAL | Status: DC
  Administered 2014-06-23 – 2014-06-24 (×2): 6.25 mg via ORAL
  Filled 2014-06-23 (×3): qty 1

## 2014-06-23 MED ORDER — ACETAMINOPHEN 650 MG RE SUPP: 650.0000 mg | Freq: Four times a day (QID) | RECTAL | Status: DC | PRN

## 2014-06-23 MED ORDER — TORSEMIDE 20 MG PO TABS
20.0000 mg | ORAL_TABLET | Freq: Every day | ORAL | Status: DC
  Administered 2014-06-23: 20 mg via ORAL
  Filled 2014-06-23: qty 1

## 2014-06-23 MED ORDER — ACETAMINOPHEN 325 MG PO TABS
650.0000 mg | ORAL_TABLET | Freq: Four times a day (QID) | ORAL | Status: DC | PRN
Start: 2014-06-23 — End: 2014-06-24
  Administered 2014-06-23 – 2014-06-24 (×2): 650 mg via ORAL
  Filled 2014-06-23 (×2): qty 2

## 2014-06-23 MED ORDER — SACUBITRIL-VALSARTAN 24-26 MG PO TABS
1.0000 | ORAL_TABLET | Freq: Two times a day (BID) | ORAL | Status: DC
  Administered 2014-06-23: 1 via ORAL
  Filled 2014-06-23: qty 1

## 2014-06-23 NOTE — ED Notes (Signed)
Dr. reddy in to see pt.

## 2014-06-23 NOTE — ED Notes (Signed)
ELEVATED LAB: Troponin 0.05, EDP Gayle notified

## 2014-06-23 NOTE — Progress Notes (Deleted)
recived pt to room 243 via bed.  O2  At 5liters [place].  Pt states he is feeling a little better.  Right sided incison with staples, clean and dry.  Dressing of 4x4 applied via Dr Katrinka Blazing.  A large blister like on the upper area of umbilical.  No drainage from the blister.  Bilateral legs with no edema or lacerations or echomotic areas.

## 2014-06-23 NOTE — Consult Note (Addendum)
Reason for Consult: palpitations SVT cardiomyopathy Referring Physician:  Dr. Fay Records hospitalist Primary cardiologist Dr. Renold Genta Melanie Lewis is an 80 y.o. female.  HPI:  80 year old black female presented with palpitations tachycardia and subsequently was found have SVT reportedly was treated with adenosine x2 and finally broken and was admitted for further evaluation. The patient denies any chest pain.  She had no worsening shortness of breath did not blackout did not have any syncope. The patient reportedly is had cardiomyopathy in the past nonischemic including cardiac catheterization a few months ago has been followed by [facility] failure [facility]. Patient states  Prep presented with palpitations tachycardia weakness and fatigue. Patient has significant and congestive heart failure chronic renal sufficiency.  Past Medical History  Diagnosis Date  . CHF (congestive heart failure)   . Hypertension   . Coronary artery disease   . Dementia   . hyperthyroid   . Hyperthyroidism     Past Surgical History  Procedure Laterality Date  . Cardiac catheterization    . Partial hysterectomy    . Cholecystectomy    . Abdominal hysterectomy      Family History  Problem Relation Age of Onset  . Heart attack Sister   . Hypertension Sister     Social History:  reports that she has never smoked. She has never used smokeless tobacco. She reports that she drinks about 1.2 oz of alcohol per week. She reports that she does not use illicit drugs.  Allergies: No Known Allergies  Medications:  Prior to Admission:  Prescriptions prior to admission  Medication Sig Dispense Refill Last Dose  . aspirin EC 81 MG tablet Take 81 mg by mouth daily.   06/22/2014 at Unknown time  . carvedilol (COREG) 6.25 MG tablet Take 6.25 mg by mouth 2 (two) times daily with a meal.   06/22/2014 at 2100  . donepezil (ARICEPT) 5 MG tablet Take 5 mg by mouth at bedtime.   06/22/2014 at Unknown time  . methimazole  (TAPAZOLE) 10 MG tablet Take 10 mg by mouth 2 (two) times daily.    06/22/2014 at Unknown time  . potassium chloride SA (K-DUR,KLOR-CON) 20 MEQ tablet Take 20 mEq by mouth daily.   06/22/2014 at Unknown time  . sacubitril-valsartan (ENTRESTO) 24-26 MG Take 1 tablet by mouth 2 (two) times daily. 60 tablet 5 06/22/2014 at 2100  . torsemide (DEMADEX) 20 MG tablet Take 20 mg by mouth daily.   06/22/2014 at Unknown time    Results for orders placed or performed during the hospital encounter of 06/23/14 (from the past 48 hour(s))  CBC with Differential     Status: Abnormal   Collection Time: 06/23/14  1:31 AM  Result Value Ref Range   WBC 8.0 3.6 - 11.0 K/uL   RBC 4.11 3.80 - 5.20 MIL/uL   Hemoglobin 12.8 12.0 - 16.0 g/dL   HCT 38.1 35.0 - 47.0 %   MCV 92.8 80.0 - 100.0 fL   MCH 31.2 26.0 - 34.0 pg   MCHC 33.6 32.0 - 36.0 g/dL   RDW 16.2 (H) 11.5 - 14.5 %   Platelets 134 (L) 150 - 440 K/uL   Neutrophils Relative % 68 %   Neutro Abs 5.5 1.4 - 6.5 K/uL   Lymphocytes Relative 18 %   Lymphs Abs 1.4 1.0 - 3.6 K/uL   Monocytes Relative 11 %   Monocytes Absolute 0.9 0.2 - 0.9 K/uL   Eosinophils Relative 1 %   Eosinophils Absolute 0.1 0 -  0.7 K/uL   Basophils Relative 2 %   Basophils Absolute 0.2 (H) 0 - 0.1 K/uL  Comprehensive metabolic panel     Status: Abnormal   Collection Time: 06/23/14  1:31 AM  Result Value Ref Range   Sodium 140 135 - 145 mmol/L   Potassium 4.2 3.5 - 5.1 mmol/L   Chloride 105 101 - 111 mmol/L   CO2 25 22 - 32 mmol/L   Glucose, Bld 185 (H) 65 - 99 mg/dL   BUN 20 6 - 20 mg/dL   Creatinine, Ser 1.58 (H) 0.44 - 1.00 mg/dL   Calcium 8.8 (L) 8.9 - 10.3 mg/dL   Total Protein 6.8 6.5 - 8.1 g/dL   Albumin 3.4 (L) 3.5 - 5.0 g/dL   AST 33 15 - 41 U/L   ALT 23 14 - 54 U/L   Alkaline Phosphatase 134 (H) 38 - 126 U/L   Total Bilirubin 0.6 0.3 - 1.2 mg/dL   GFR calc non Af Amer 30 (L) >60 mL/min   GFR calc Af Amer 35 (L) >60 mL/min    Comment: (NOTE) The eGFR has been  calculated using the CKD EPI equation. This calculation has not been validated in all clinical situations. eGFR's persistently <60 mL/min signify possible Chronic Kidney Disease.    Anion gap 10 5 - 15  Troponin I     Status: Abnormal   Collection Time: 06/23/14  1:31 AM  Result Value Ref Range   Troponin I 0.05 (H) <0.031 ng/mL    Comment: READ BACK AND VERIFIED BY NOEL WEBSTER AT 0246 ON 06/23/14 RWW        PERSISTENTLY INCREASED TROPONIN VALUES IN THE RANGE OF 0.04-0.49 ng/mL CAN BE SEEN IN:       -UNSTABLE ANGINA       -CONGESTIVE HEART FAILURE       -MYOCARDITIS       -CHEST TRAUMA       -ARRYHTHMIAS       -LATE PRESENTING MYOCARDIAL INFARCTION       -COPD   CLINICAL FOLLOW-UP RECOMMENDED.   TSH     Status: Abnormal   Collection Time: 06/23/14  1:31 AM  Result Value Ref Range   TSH 0.333 (L) 0.350 - 4.500 uIU/mL  T4, free     Status: None   Collection Time: 06/23/14  1:31 AM  Result Value Ref Range   Free T4 0.87 0.61 - 1.12 ng/dL  Urinalysis complete, with microscopic ([facility] only)     Status: Abnormal   Collection Time: 06/23/14  1:32 AM  Result Value Ref Range   Color, Urine COLORLESS (A) YELLOW   APPearance CLEAR (A) CLEAR   Glucose, UA 50 (A) NEGATIVE mg/dL   Bilirubin Urine NEGATIVE NEGATIVE   Ketones, ur NEGATIVE NEGATIVE mg/dL   Specific Gravity, Urine 1.005 1.005 - 1.030   Hgb urine dipstick NEGATIVE NEGATIVE   pH 7.0 5.0 - 8.0   Protein, ur NEGATIVE NEGATIVE mg/dL   Nitrite NEGATIVE NEGATIVE   Leukocytes, UA NEGATIVE NEGATIVE   RBC / HPF 0-5 0 - 5 RBC/hpf   WBC, UA 0-5 0 - 5 WBC/hpf   Bacteria, UA RARE (A) NONE SEEN   Squamous Epithelial / LPF 0-5 (A) NONE SEEN   Mucous PRESENT   Troponin I     Status: Abnormal   Collection Time: 06/23/14  6:48 AM  Result Value Ref Range   Troponin I 0.31 (H) <0.031 ng/mL    Comment: READ BACK AND VERIFIED WITH  DENISE FREEMAN AT 1594 06/23/14 DAS        PERSISTENTLY INCREASED TROPONIN VALUES IN THE RANGE OF  0.04-0.49 ng/mL CAN BE SEEN IN:       -UNSTABLE ANGINA       -CONGESTIVE HEART FAILURE       -MYOCARDITIS       -CHEST TRAUMA       -ARRYHTHMIAS       -LATE PRESENTING MYOCARDIAL INFARCTION       -COPD   CLINICAL FOLLOW-UP RECOMMENDED.     Dg Chest Portable 1 View  06/23/2014   CLINICAL DATA:  Acute onset of shortness of breath and heart palpitations. Initial encounter.  EXAM: PORTABLE CHEST - 1 VIEW  COMPARISON:  Chest radiograph performed 02/11/2014  FINDINGS: The lungs are well-aerated and clear. There is no evidence of focal opacification, pleural effusion or pneumothorax.  The cardiomediastinal silhouette is within normal limits. No acute osseous abnormalities are seen.  IMPRESSION: No acute cardiopulmonary process seen.   Electronically Signed   By: Garald Balding M.D.   On: 06/23/2014 02:12    Review of Systems  Constitutional: Negative.   HENT: Negative.   Eyes: Negative.   Respiratory: Negative.   Cardiovascular: Positive for palpitations and leg swelling.  Gastrointestinal: Negative.   Genitourinary: Negative.   Musculoskeletal: Negative.   Skin: Negative.   Neurological: Negative.   Endo/Heme/Allergies: Negative.   Psychiatric/Behavioral: Negative.    Blood pressure 173/69, pulse 61, temperature 96.6 F (35.9 C), temperature source Axillary, resp. rate 20, height 5' 1"  (1.549 m), weight 92.599 kg (204 lb 2.3 oz), SpO2 99 %. Physical Exam  Constitutional: She is oriented to person, place, and time. She appears well-developed and well-nourished.  HENT:  Head: Normocephalic and atraumatic.  Right Ear: External ear normal.  Eyes: EOM are normal. Pupils are equal, round, and reactive to light.  Neck: Normal range of motion. Neck supple.  Cardiovascular: Normal rate, regular rhythm, S1 normal, S2 normal and normal pulses.  Exam reveals gallop, S3 and S4.   Murmur heard.  Systolic murmur is present with a grade of 2/6  S3 the S4 systolic ejection murmur at the apex.   PMI displaced laterally  Respiratory: Effort normal and breath sounds normal.  GI: Soft. Bowel sounds are normal.  Musculoskeletal: Normal range of motion.  Neurological: She is alert and oriented to person, place, and time.  Skin: Skin is warm.  Psychiatric: She has a normal mood and affect.    Assessment/Plan:  congestive heart failure  SVT  nonsustained ventricular tachycardia  cardiomyopathy  chronic renal insufficiency  tachycardia  dementia  thyroid disease  hypertension  borderline troponin  bradycardia  sick sinus syndrome . PLAN  agree with rule out myocardial infarction  follow-up enzymes including troponin  continue EKGs and telemetry  continue heart failure therapy including Coreg   avoid Ace inhibitors for now because of renal insufficiency  consider adding hydralazine Imdur or for heart failure  continue diuretic therapy   agree with treatment for thyroid disease  consider Nephrology console for management of renal insufficiency  agree with echocardiogram for assessment of LV function  consider amiodarone for SVT therapy  consider permanent pacemaker placement for sick sinus syndrome  consider EP evaluation for possible AICD pacemaker  Brandee Markin D. 06/23/2014, 11:50 AM

## 2014-06-23 NOTE — ED Provider Notes (Signed)
[facility] Emergency Department Provider Note  ____________________________________________  Time seen: Approximately 1:37 AM  I have reviewed the triage vital signs and the nursing notes.   HISTORY  Chief Complaint Palpitations    HPI Melanie Lewis is a 80 y.o. female with history of CHF, hypertension, or artery disease, dementia, hyperthyroidism, SVT, presents to the emergency department tonight for evaluation of sudden onset heart palpitations. Patient reports that at approximately 11:30 PM she developed palpitations, sensation of heart racing, shortness of breath, no chest pain. On EMS arrival, EKG was concerning for SVT with a rate in the 180s which did not respond to 6 mg of adenosine. She was then given 12 mg of adenosine after which she converted to sinus rhythm. Currently, she is asymptomatic/palpitations resolved. No modifying factors. She has had runny nose but otherwise has been in her usual state of health without recent illness, no abdominal pain, vomiting, diarrhea, fevers, chills, dysuria.   Past Medical History  Diagnosis Date  . CHF (congestive heart failure)   . Hypertension   . Coronary artery disease   . Dementia   . hyperthyroid     Patient Active Problem List   Diagnosis Date Noted  . Chronic systolic heart failure 04/19/2014  . Essential hypertension 04/19/2014  . Bradycardia 04/19/2014    Past Surgical History  Procedure Laterality Date  . Cardiac catheterization    . Partial hysterectomy    . Cholecystectomy      Current Outpatient Rx  Name  Route  Sig  Dispense  Refill  . aspirin EC 81 MG tablet   Oral   Take 81 mg by mouth daily.         . carvedilol (COREG) 6.25 MG tablet   Oral   Take 6.25 mg by mouth 2 (two) times daily with a meal.         . donepezil (ARICEPT) 5 MG tablet   Oral   Take 5 mg by mouth at bedtime.         . methimazole (TAPAZOLE) 10 MG tablet   Oral   Take 10 mg by mouth 2 (two)  times daily.          . potassium chloride SA (K-DUR,KLOR-CON) 20 MEQ tablet   Oral   Take 20 mEq by mouth daily.         . sacubitril-valsartan (ENTRESTO) 24-26 MG   Oral   Take 1 tablet by mouth 2 (two) times daily.   60 tablet   5   . torsemide (DEMADEX) 20 MG tablet   Oral   Take 20 mg by mouth daily.           Allergies Review of patient's allergies indicates no known allergies.  Family History  Problem Relation Age of Onset  . Heart attack Sister   . Hypertension Sister     Social History History  Substance Use Topics  . Smoking status: Never Smoker   . Smokeless tobacco: Never Used  . Alcohol Use: 1.2 oz/week    0 Standard drinks or equivalent, 2 Shots of liquor per week    Review of Systems Constitutional: No fever/chills Eyes: No visual changes. ENT: No sore throat. Cardiovascular: Denies chest pain. Respiratory: + shortness of breath. Gastrointestinal: No abdominal pain.  No nausea, no vomiting.  No diarrhea.  No constipation. Genitourinary: Negative for dysuria. Musculoskeletal: Negative for back pain. Skin: Negative for rash. Neurological: Negative for headaches, focal weakness or numbness.  10-point ROS otherwise  negative.  ____________________________________________   PHYSICAL EXAM:  VITAL SIGNS: ED Triage Vitals  Enc Vitals Group     BP 06/23/14 0121 148/84 mmHg     Pulse Rate 06/23/14 0120 84     Resp 06/23/14 0121 22     Temp 06/23/14 0120 98 F (36.7 C)     Temp Source 06/23/14 0120 Oral     SpO2 06/23/14 0121 99 %     Weight 06/23/14 0121 204 lb 2.3 oz (92.599 kg)     Height 06/23/14 0121 5\' 1"  (1.549 m)     Head Cir --      Peak Flow --      Pain Score 06/23/14 0122 7     Pain Loc --      Pain Edu? --      Excl. in GC? --     Constitutional: Alert and oriented. Well appearing and in no acute distress. Eyes: Conjunctivae are normal. PERRL. EOMI. Head: Atraumatic. Nose: No congestion/rhinnorhea. Mouth/Throat:  Mucous membranes are moist.  Oropharynx non-erythematous. Neck: No stridor.  Cardiovascular: Normal rate, regular rhythm. Grossly normal heart sounds.  Good peripheral circulation. Respiratory: Normal respiratory effort.  No retractions. Lungs CTAB. Gastrointestinal: Soft and nontender. No distention. No abdominal bruits. No CVA tenderness. Genitourinary: deferred Musculoskeletal: No lower extremity tenderness nor edema.  No joint effusions. Neurologic:  Normal speech and language. No gross focal neurologic deficits are appreciated. Speech is normal. No gait instability. Skin:  Skin is warm, dry and intact. No rash noted. Psychiatric: Mood and affect are normal. Speech and behavior are normal.  ____________________________________________   LABS (all labs ordered are listed, but only abnormal results are displayed)  Labs Reviewed  CBC WITH DIFFERENTIAL/PLATELET - Abnormal; Notable for the following:    RDW 16.2 (*)    Platelets 134 (*)    Basophils Absolute 0.2 (*)    All other components within normal limits  COMPREHENSIVE METABOLIC PANEL - Abnormal; Notable for the following:    Glucose, Bld 185 (*)    Creatinine, Ser 1.58 (*)    Calcium 8.8 (*)    Albumin 3.4 (*)    Alkaline Phosphatase 134 (*)    GFR calc non Af Amer 30 (*)    GFR calc Af Amer 35 (*)    All other components within normal limits  TROPONIN I - Abnormal; Notable for the following:    Troponin I 0.05 (*)    All other components within normal limits  TSH - Abnormal; Notable for the following:    TSH 0.333 (*)    All other components within normal limits  URINALYSIS COMPLETEWITH MICROSCOPIC ([facility] ONLY) - Abnormal; Notable for the following:    Color, Urine COLORLESS (*)    APPearance CLEAR (*)    Glucose, UA 50 (*)    Bacteria, UA RARE (*)    Squamous Epithelial / LPF 0-5 (*)    All other components within normal limits  T4, FREE   ____________________________________________  EKG  ED ECG REPORT I,  Gayla Doss, the attending physician, personally viewed and interpreted this ECG.   Date: 06/23/2014  EKG Time: 01:07  Rate: 91  Rhythm: sinus rhythm with first-degree AV block  Axis: Normal  Intervals:first-degree A-V block   ST&T Change: No acute ST segment change  ____________________________________________  RADIOLOGY  CXR IMPRESSION: No acute cardiopulmonary process seen. ____________________________________________   PROCEDURES  Procedure(s) performed: None  Critical Care performed: No  ____________________________________________   INITIAL IMPRESSION / ASSESSMENT AND  PLAN / ED COURSE  Pertinent labs & imaging results that were available during my care of the patient were reviewed by me and considered in my medical decision making (see chart for details).  Melanie Lewis is a 80 y.o. female with history of CHF, hypertension, or artery disease, dementia, hyperthyroidism, SVT, presents to the emergency department tonight for evaluation of sudden onset palpitations found to be in SVT now converted after EMS gave adenosine. On arrival to the emergency department, heart rate in the 80s, stable blood pressure, she is asymptomatic. EKG confirms sinus rhythm. We'll continue to monitor, obtain basic labs, chest x-ray, urinalysis.  ----------------------------------------- 3:46 AM on 06/23/2014 ----------------------------------------- Patient remains in sinus rhythm. Troponin elevated at 0.05. Question NSTEMI versus demand ischemia. Aspirin ordered. Labs also concerning for acute renal insufficiency. Light IV hydration given history of CHF. Discussed with hospitalist for admission. ____________________________________________   FINAL CLINICAL IMPRESSION(S) / ED DIAGNOSES  Final diagnoses:  NSTEMI (non-ST elevated myocardial infarction)  SVT (supraventricular tachycardia)  Acute renal insufficiency      Gayla Doss, MD 06/23/14 [id number]

## 2014-06-23 NOTE — Progress Notes (Deleted)
Called the tele tech,  Pt is running AFIB, PVC  And heart rate of 77.

## 2014-06-23 NOTE — Progress Notes (Addendum)
[facility] [place] [facility] - [place] at [facility]   PATIENT NAME: Melanie Lewis    MR#:  [id number]  DATE OF BIRTH:  27-Mar-1934  SUBJECTIVE:  Came in with palpitations and found to have SVT. recieved  and 12 mg of adenosine. Doing well  REVIEW OF SYSTEMS:    Review of Systems  Constitutional: Negative for fever, chills and diaphoresis.  HENT: Negative for congestion, ear pain, hearing loss, nosebleeds and sore throat.   Eyes: Negative for blurred vision, double vision, photophobia and pain.  Respiratory: Negative for hemoptysis, sputum production, wheezing and stridor.   Cardiovascular: Negative for orthopnea, claudication and leg swelling.  Gastrointestinal: Negative for heartburn and abdominal pain.  Genitourinary: Negative for dysuria and frequency.  Musculoskeletal: Negative for back pain, joint pain and neck pain.  Skin: Negative for rash.  Neurological: Negative for tingling, sensory change, speech change, focal weakness, seizures and headaches.  Endo/Heme/Allergies: Does not bruise/bleed easily.  Psychiatric/Behavioral: Negative for memory loss. The patient is not nervous/anxious.   All other systems reviewed and are negative.   DRUG ALLERGIES:  No Known Allergies  VITALS:  Blood pressure 173/69, pulse 61, temperature 96.6 F (35.9 C), temperature source Axillary, resp. rate 20, height  (1.549 m), weight 92.599 kg (204 lb 2.3 oz), SpO2 99 %.  PHYSICAL EXAMINATION:   Physical Exam  GENERAL:  80 y.o.-year-old patient lying in the bed with no acute distress.  EYES: Pupils equal, round, reactive to light and accommodation. No scleral icterus. Extraocular muscles intact.  HEENT: Head atraumatic, normocephalic. Oropharynx and nasopharynx clear.  NECK:  Supple, no jugular venous distention. No thyroid enlargement, no tenderness.  LUNGS: Normal breath sounds bilaterally, no wheezing, rales, rhonchi. No use of accessory muscles of respiration.   CARDIOVASCULAR: S1, S2 normal. No murmurs, rubs, or gallops.  ABDOMEN: Soft, nontender, nondistended. Bowel sounds present. No organomegaly or mass.  EXTREMITIES: No cyanosis, clubbing or edema b/l.    NEUROLOGIC: Cranial nerves II through XII are intact. No focal Motor or sensory deficits b/l.   PSYCHIATRIC:  patient is alert and oriented x 3.  SKIN: No obvious rash, lesion, or ulcer.    LABORATORY PANEL:   CBC  Recent Labs Lab 06/23/14 0131  WBC 8.0  HGB 12.8  HCT 38.1  PLT 134*   ------------------------------------------------------------------------------------------------------------------  Chemistries   Recent Labs Lab 06/23/14 0131  NA 140  K 4.2  CL 105  CO2 25  GLUCOSE 185*  BUN 20  CREATININE 1.58*  CALCIUM 8.8*  AST 33  ALT 23  ALKPHOS 134*  BILITOT 0.6   ------------------------------------------------------------------------------------------------------------------  Cardiac Enzymes  Recent Labs Lab 06/23/14 0648  TROPONINI 0.31*   ------------------------------------------------------------------------------------------------------------------  RADIOLOGY:  Dg Chest Portable 1 View  06/23/2014   CLINICAL DATA:  Acute onset of shortness of breath and heart palpitations. Initial encounter.  EXAM: PORTABLE CHEST - 1 VIEW  COMPARISON:  Chest radiograph performed 02/11/2014  FINDINGS: The lungs are well-aerated and clear. There is no evidence of focal opacification, pleural effusion or pneumothorax.  The cardiomediastinal silhouette is within normal limits. No acute osseous abnormalities are seen.  IMPRESSION: No acute cardiopulmonary process seen.   Electronically Signed   By: Roanna Raider M.D.   On: 06/23/2014 02:12     ASSESSMENT AND PLAN:  80 y.o. female with h/o CHF, HTN.,Dementia presents to the emergency room with the complaints of sudden onset of palpitations while at home at around 11:30 PM last night.   1. Episode of SVT at  home,  received adenosine, converted to sinus rhythm, maintaining sinus rhythm. -seen by Dr Juliann Pares -Echo showed EF >655 -Consider Amiodarone if NSVT cont  2. Elevated troponin, mild. Likely demand ischemia, ruled out ACS.  3. Acute kidney injury, creatinine 1.58, creatinine in January 2016 was 0.88.  -give IVF. Hold enteresto, lasix  4. Hypertension, stable on home medications.  5. History of systolic congestive heart failure,, compensated on home medications.  6. History of coronary artery disease.  7. Dementia, stable on home medications.  8. Hyperthyroidism, on methimazole. TSH mildly low, free T4 WNL.  Management plans discussed with the patient, family and they are in agreement.  CODE STATUS: FULL  DVT Prophylaxis: Heparin   TOTAL TIME TAKING CARE OF THIS PATIENT: 35 minutes.   POSSIBLE D/C IN 1-2 DAYS, DEPENDING ON CLINICAL CONDITION.   Stepahnie Campo M.D on 06/23/2014 at 1:44 PM  Between 7am to 6pm - Pager - [phone]  After 6pm go to [email or web address] - password [email or web address] [facility]  [facility]  Office  [phone]  CC: Primary care physician; Derwood Kaplan, MD

## 2014-06-23 NOTE — ED Notes (Signed)
Patient presents to Emergency Department via EMS with complaints of palpitations with SOB. Ems found SVT 180's; 18 mg adeno cardioverted x 2 pt to the 90's (noted quadgeminy x 2)  then applied 4 lpm [place] O2 to keep HR <100. Pt reports hx of CHF, Thyroid, HTN, SVT

## 2014-06-23 NOTE — H&P (Signed)
[facility] -  at [facility]   PATIENT NAME: Melanie Lewis    MR#:  [id number]  DATE OF BIRTH:  [birth year]  DATE OF ADMISSION:  06/23/2014  PRIMARY CARE PHYSICIAN: Derwood Kaplan, MD   REQUESTING/REFERRING PHYSICIAN: Chari Manning  CHIEF COMPLAINT:   Chief Complaint  Patient presents with  . Palpitations    HISTORY OF PRESENT ILLNESS:  Melanie Lewis  is a 80 y.o. female with below mentioned past medical history presents to the emergency room with the complaints of sudden onset of palpitations while at home at around 11:30 PM last night. Mild shortness of breath during palpitations but no chest pain. EMS was called on-site and the patient was noted to have SVT with heart rate in the range of 180s. Patient received IV adenosine 6 mg without any results, had a repeat dose of IV adenosine 12 mg following which she converted back to sinus rhythm. Since then she remained asymptomatic without any palpitations, chest pain, shortness of breath. She is maintaining sinus rhythm with heart rate in the 70s and comfortably resting in the bed. In the emergency room on arrival patient was noted to be alert awake and oriented, in no distress, stable vital signs and maintaining sinus rhythm. Further workup revealed a troponin mild elevation of 0.05, creatinine 1.58 which is new. Chest x-ray unremarkable for any acute cardiopulmonary pathology. EKG normal sinus rhythm with ventricular rate of 91 bpm, first-degree AV block, septal infarct. Patient was given aspirin and hospitalist service was consulted for further management. Patient is comfortably resting in the bed and denies any complaints at this time. Denies any history of fever, URI symptoms, nausea, vomiting, diarrhea, abdominal pain.  PAST MEDICAL HISTORY:   Past Medical History  Diagnosis Date  . CHF (congestive heart failure)   . Hypertension   . Coronary artery disease   . Dementia   . hyperthyroid   .  Hyperthyroidism     PAST SURGICAL HISTORY:   Past Surgical History  Procedure Laterality Date  . Cardiac catheterization    . Partial hysterectomy    . Cholecystectomy    . Abdominal hysterectomy      SOCIAL HISTORY:   History  Substance Use Topics  . Smoking status: Never Smoker   . Smokeless tobacco: Never Used  . Alcohol Use: 1.2 oz/week    2 Shots of liquor, 0 Standard drinks or equivalent per week    FAMILY HISTORY:   Family History  Problem Relation Age of Onset  . Heart attack Sister   . Hypertension Sister     DRUG ALLERGIES:  No Known Allergies  REVIEW OF SYSTEMS:   Review of Systems  Constitutional: Negative for fever, chills and malaise/fatigue.  HENT: Negative for ear pain, hearing loss, nosebleeds, sore throat and tinnitus.   Eyes: Negative for blurred vision, double vision, pain, discharge and redness.  Respiratory: Negative for cough, hemoptysis, sputum production, shortness of breath and wheezing.   Cardiovascular: Negative for chest pain, palpitations, orthopnea and leg swelling.       Episode of palpitations at home as mentioned in history of present illness.  Gastrointestinal: Negative for nausea, vomiting, abdominal pain, diarrhea, constipation, blood in stool and melena.  Genitourinary: Negative for dysuria, urgency, frequency and hematuria.  Musculoskeletal: Negative for back pain, joint pain and neck pain.  Skin: Negative for itching and rash.  Neurological: Negative for dizziness, tingling, sensory change, focal weakness and seizures.  Endo/Heme/Allergies: Does not bruise/bleed easily.  Psychiatric/Behavioral:  Negative for depression. The patient is not nervous/anxious.     MEDICATIONS AT HOME:   Prior to Admission medications   Medication Sig Start Date End Date Taking? Authorizing Provider  aspirin EC 81 MG tablet Take 81 mg by mouth daily.   Yes Historical Provider, MD  carvedilol (COREG) 6.25 MG tablet Take 6.25 mg by mouth 2 (two)  times daily with a meal.   Yes Historical Provider, MD  donepezil (ARICEPT) 5 MG tablet Take 5 mg by mouth at bedtime.   Yes Historical Provider, MD  methimazole (TAPAZOLE) 10 MG tablet Take 10 mg by mouth 2 (two) times daily.    Yes Historical Provider, MD  potassium chloride SA (K-DUR,KLOR-CON) 20 MEQ tablet Take 20 mEq by mouth daily.   Yes Historical Provider, MD  sacubitril-valsartan (ENTRESTO) 24-26 MG Take 1 tablet by mouth 2 (two) times daily. 05/25/14  Yes Delma Freeze, FNP  torsemide (DEMADEX) 20 MG tablet Take 20 mg by mouth daily.   Yes Historical Provider, MD      VITAL SIGNS:  Blood pressure 160/74, pulse 70, temperature 98 F (36.7 C), temperature source Oral, resp. rate 19, height  (1.549 m), weight 92.599 kg (204 lb 2.3 oz), SpO2 97 %.  PHYSICAL EXAMINATION:  Physical Exam  Constitutional: She is oriented to person, place, and time. She appears well-developed and well-nourished. No distress.  HENT:  Head: Normocephalic and atraumatic.  Right Ear: External ear normal.  Left Ear: External ear normal.  Nose: Nose normal.  Mouth/Throat: Oropharynx is clear and moist. No oropharyngeal exudate.  Eyes: EOM are normal. Pupils are equal, round, and reactive to light. No scleral icterus.  Neck: Normal range of motion. Neck supple. No JVD present. No thyromegaly present.  Cardiovascular: Normal rate, regular rhythm, normal heart sounds and intact distal pulses.  Exam reveals no friction rub.   No murmur heard. Respiratory: Effort normal and breath sounds normal. No respiratory distress. She has no wheezes. She has no rales. She exhibits no tenderness.  GI: Soft. Bowel sounds are normal. She exhibits no distension and no mass. There is no tenderness. There is no rebound and no guarding.  Musculoskeletal: Normal range of motion. She exhibits no edema.  Lymphadenopathy:    She has no cervical adenopathy.  Neurological: She is alert and oriented to person, place, and time. She  has normal reflexes. She displays normal reflexes. No cranial nerve deficit. She exhibits normal muscle tone.  Skin: Skin is warm. No rash noted. No erythema.  Psychiatric: She has a normal mood and affect. Her behavior is normal. Thought content normal.   LABORATORY PANEL:   CBC  Recent Labs Lab 06/23/14 0131  WBC 8.0  HGB 12.8  HCT 38.1  PLT 134*   ------------------------------------------------------------------------------------------------------------------  Chemistries   Recent Labs Lab 06/23/14 0131  NA 140  K 4.2  CL 105  CO2 25  GLUCOSE 185*  BUN 20  CREATININE 1.58*  CALCIUM 8.8*  AST 33  ALT 23  ALKPHOS 134*  BILITOT 0.6   ------------------------------------------------------------------------------------------------------------------  Cardiac Enzymes  Recent Labs Lab 06/23/14 0131  TROPONINI 0.05*   ------------------------------------------------------------------------------------------------------------------  RADIOLOGY:  Dg Chest Portable 1 View  06/23/2014   CLINICAL DATA:  Acute onset of shortness of breath and heart palpitations. Initial encounter.  EXAM: PORTABLE CHEST - 1 VIEW  COMPARISON:  Chest radiograph performed 02/11/2014  FINDINGS: The lungs are well-aerated and clear. There is no evidence of focal opacification, pleural effusion or pneumothorax.  The cardiomediastinal  silhouette is within normal limits. No acute osseous abnormalities are seen.  IMPRESSION: No acute cardiopulmonary process seen.   Electronically Signed   By: Roanna Raider M.D.   On: 06/23/2014 02:12    EKG:   Orders placed or performed in visit on 02/06/14  . EKG 12-Lead normal sinus rhythm with ventricular rate of 91 bpm, first-degree AV block, septal infarct.     IMPRESSION AND PLAN:   1. Episode of SVT at home, received adenosine, converted to sinus rhythm, maintaining sinus rhythm. 2. Elevated troponin, mild. Likely demand ischemia, rule out ACS. 3.  Acute kidney injury, creatinine 1.58, creatinine in January 2016 was 0.88.  4. Hypertension, stable on home medications. 5. History of systolic congestive heart failure,, compensated on home medications. 6. History of coronary artery disease. 7. Dementia, stable on home medications. 8. Hyperthyroidism, on methimazole. TSH mildly low, free T4 WNL.  Plan: Admit to telemetry, cardiac monitoring, cycle cardiac enzymes, continue aspirin, beta blocker. Order echocardiogram, cardiology consultation requested for further advice. Gentle IV hydration, avoid nephrotoxic agents, follow-up BMP. Continue home medications for hypertension, dementia. Hold methimazole for now, follow-up TSH.     All the records are reviewed and case discussed with ED provider. Management plans discussed with the patient, family and they are in agreement.  CODE STATUS: Full code  TOTAL TIME TAKING CARE OF THIS PATIENT: 50 minutes.    Crissie Figures M.D on 06/23/2014 at 4:20 AM  Between 7am to 6pm - Pager - [phone]  After 6pm go to [email or web address] - password [email or web address] [facility]  [place] [facility]  Office  [phone]  CC: Primary care physician; Derwood Kaplan, MD

## 2014-06-23 NOTE — Progress Notes (Signed)
Talked to Dr Allena Katz this am to make aware of tropoinin level of 0.31.

## 2014-06-24 LAB — BASIC METABOLIC PANEL
ANION GAP: 3 — AB (ref 5–15)
BUN: 13 mg/dL (ref 6–20)
CO2: 26 mmol/L (ref 22–32)
Calcium: 8.4 mg/dL — ABNORMAL LOW (ref 8.9–10.3)
Chloride: 108 mmol/L (ref 101–111)
Creatinine, Ser: 1.13 mg/dL — ABNORMAL HIGH (ref 0.44–1.00)
GFR calc non Af Amer: 46 mL/min — ABNORMAL LOW (ref >60)
GFR, EST AFRICAN AMERICAN: 53 mL/min — AB (ref >60)
GLUCOSE: 94 mg/dL (ref 65–99)
Potassium: 4.1 mmol/L (ref 3.5–5.1)
SODIUM: 137 mmol/L (ref 135–145)

## 2014-06-24 MED ORDER — TORSEMIDE 20 MG PO TABS: 20.0000 mg | ORAL_TABLET | ORAL | Status: DC

## 2014-06-24 MED ORDER — POTASSIUM CHLORIDE CRYS ER 20 MEQ PO TBCR: 20.0000 meq | EXTENDED_RELEASE_TABLET | ORAL | Status: DC

## 2014-06-24 NOTE — Discharge Instructions (Signed)
Keep log of BP at home °

## 2014-06-24 NOTE — Discharge Summary (Signed)
[facility] - [place] at [facility]   PATIENT NAME: Melanie Lewis    MR#:  [id number]  DATE OF BIRTH:  [birth year]  DATE OF ADMISSION:  06/23/2014 ADMITTING PHYSICIAN: Crissie Figures, MD  DATE OF DISCHARGE: 06/24/14  PRIMARY CARE PHYSICIAN: Derwood Kaplan, MD    ADMISSION DIAGNOSIS:  SVT (supraventricular tachycardia) [I47.1] Acute renal insufficiency [N28.9] NSTEMI (non-ST elevated myocardial infarction) [I21.4]  DISCHARGE DIAGNOSIS:  SVT Acute renal insufficiency-resolved HTN  SECONDARY DIAGNOSIS:   Past Medical History  Diagnosis Date  . CHF (congestive heart failure)   . Hypertension   . Coronary artery disease   . Dementia   . hyperthyroid   . Hyperthyroidism     HOSPITAL COURSE:   80 y.o. female with h/o CHF, HTN.,Dementia presents to the emergency room with the complaints of sudden onset of palpitations while at home at around 11:30 PM last night.   1. Episode of SVT at home, received adenosine, converted to sinus rhythm, maintaining sinus rhythm. -seen by Dr Juliann Pares -Echo showed EF >655 -pt remains in SR. Cont coreg for now  2. Elevated troponin, mild. Likely demand ischemia, ruled out ACS. -no cp  3. Acute kidney injury, creatinine 1.58, creatinine in January 2016 was 0.88.  -give IVF. Hold enteresto, lasix -creat down to 1.1. Changed lasix to qod  4. Hypertension, stable on home medications.  5. History of systolic congestive heart failure, compensated on home medications.  6. History of coronary artery disease.  7. Dementia, stable on home medications.  Overall much better. Spoke with pt and dter and ok to go home  DISCHARGE CONDITIONS:  fair  CONSULTS OBTAINED:  Treatment Team:  Alwyn Pea, MD  DRUG ALLERGIES:  No Known Allergies  DISCHARGE MEDICATIONS:   Current Discharge Medication List    CONTINUE these medications which have CHANGED   Details  potassium chloride SA (K-DUR,KLOR-CON)  20 MEQ tablet Take 1 tablet (20 mEq total) by mouth every other day. Qty: 30 tablet, Refills: 0    torsemide (DEMADEX) 20 MG tablet Take 1 tablet (20 mg total) by mouth every other day. Qty: 30 tablet, Refills: 0      CONTINUE these medications which have NOT CHANGED   Details  aspirin EC 81 MG tablet Take 81 mg by mouth daily.    carvedilol (COREG) 6.25 MG tablet Take 6.25 mg by mouth 2 (two) times daily with a meal.    donepezil (ARICEPT) 5 MG tablet Take 5 mg by mouth at bedtime.    methimazole (TAPAZOLE) 10 MG tablet Take 10 mg by mouth 2 (two) times daily.     sacubitril-valsartan (ENTRESTO) 24-26 MG Take 1 tablet by mouth 2 (two) times daily. Qty: 60 tablet, Refills: 5        If you experience worsening of your admission symptoms, develop shortness of breath, life threatening emergency, suicidal or homicidal thoughts you must seek medical attention immediately by calling 911 or calling your MD immediately  if symptoms less severe.  You Must read complete instructions/literature along with all the possible adverse reactions/side effects for all the Medicines you take and that have been prescribed to you. Take any new Medicines after you have completely understood and accept all the possible adverse reactions/side effects.   Please note  You were cared for by a hospitalist during your hospital stay. If you have any questions about your discharge medications or the care you received while you were in the hospital after you are  discharged, you can call the unit and asked to speak with the hospitalist on call if the hospitalist that took care of you is not available. Once you are discharged, your primary care physician will handle any further medical issues. Please note that NO REFILLS for any discharge medications will be authorized once you are discharged, as it is imperative that you return to your primary care physician (or establish a relationship with a primary care physician if  you do not have one) for your aftercare needs so that they can reassess your need for medications and monitor your lab values. Today   SUBJECTIVE   Doing well. Remains in SR. Wants to go home  VITAL SIGNS:  Blood pressure 154/53, pulse 64, temperature 98.7 F (37.1 C), temperature source Oral, resp. rate 18, height  (1.549 m), weight 88.406 kg (194 lb 14.4 oz), SpO2 99 %.  I/O:   Intake/Output Summary (Last 24 hours) at 06/24/14 0911 Last data filed at 06/24/14 0700  Gross per 24 hour  Intake    480 ml  Output   3975 ml  Net  -3495 ml    PHYSICAL EXAMINATION:  GENERAL:  80 y.o.-year-old patient lying in the bed with no acute distress.  EYES: Pupils equal, round, reactive to light and accommodation. No scleral icterus. Extraocular muscles intact.  HEENT: Head atraumatic, normocephalic. Oropharynx and nasopharynx clear.  NECK:  Supple, no jugular venous distention. No thyroid enlargement, no tenderness.  LUNGS: Normal breath sounds bilaterally, no wheezing, rales,rhonchi or crepitation. No use of accessory muscles of respiration.  CARDIOVASCULAR: S1, S2 normal. No murmurs, rubs, or gallops.  ABDOMEN: Soft, non-tender, non-distended. Bowel sounds present. No organomegaly or mass.  EXTREMITIES: No pedal edema, cyanosis, or clubbing.  NEUROLOGIC: Cranial nerves II through XII are intact. Muscle strength 5/5 in all extremities. Sensation intact. Gait not checked.  PSYCHIATRIC: patient is alert and oriented x 3.  SKIN: No obvious rash, lesion, or ulcer.   DATA REVIEW:   CBC   Recent Labs Lab 06/23/14 0131  WBC 8.0  HGB 12.8  HCT 38.1  PLT 134*    Chemistries   Recent Labs Lab 06/23/14 0131 06/23/14 1253 06/24/14 0452  NA 140  --  137  K 4.2  --  4.1  CL 105  --  108  CO2 25  --  26  GLUCOSE 185*  --  94  BUN 20  --  13  CREATININE 1.58*  --  1.13*  CALCIUM 8.8*  --  8.4*  MG  --  2.1  --   AST 33  --   --   ALT 23  --   --   ALKPHOS 134*  --   --    BILITOT 0.6  --   --     RADIOLOGY:  Dg Chest Portable 1 View  06/23/2014   CLINICAL DATA:  Acute onset of shortness of breath and heart palpitations. Initial encounter.  EXAM: PORTABLE CHEST - 1 VIEW  COMPARISON:  Chest radiograph performed 02/11/2014  FINDINGS: The lungs are well-aerated and clear. There is no evidence of focal opacification, pleural effusion or pneumothorax.  The cardiomediastinal silhouette is within normal limits. No acute osseous abnormalities are seen.  IMPRESSION: No acute cardiopulmonary process seen.   Electronically Signed   By: Roanna Raider M.D.   On: 06/23/2014 02:12   Management plans discussed with the patient, family and they are in agreement.  CODE STATUS:     Code Status Orders  Start     Ordered   06/23/14 0527  Full code   Continuous     06/23/14 0527      TOTAL TIME TAKING CARE OF THIS PATIENT:40 minutes.    Jcion Buddenhagen M.D on 06/24/2014 at 9:11 AM  Between 7am to 6pm - Pager - [phone] After 6pm go to [email or web address] - password [email or web address] [facility]  [place] [facility]  Office  [phone]  CC: Primary care physician; Derwood Kaplan, MD

## 2014-06-24 NOTE — Progress Notes (Signed)
Skin warm and dry, IV and telemetry removed. Pt denies pain/ SOB/ dizziness. Discharge instructions give and pt verbalizes understanding of all. Denies have questions or additional concerns at this time

## 2014-06-29 ENCOUNTER — Encounter: Payer: Self-pay | Admitting: Family

## 2014-06-29 ENCOUNTER — Ambulatory Visit: Payer: Medicare Other | Attending: Family | Admitting: Family

## 2014-06-29 VITALS — BP 158/81 | HR 64 | Resp 20 | Ht 62.0 in | Wt 195.0 lb

## 2014-06-29 DIAGNOSIS — I10 Essential (primary) hypertension: Secondary | ICD-10-CM | POA: Diagnosis not present

## 2014-06-29 DIAGNOSIS — I509 Heart failure, unspecified: Secondary | ICD-10-CM | POA: Diagnosis present

## 2014-06-29 DIAGNOSIS — Z7982 Long term (current) use of aspirin: Secondary | ICD-10-CM | POA: Diagnosis not present

## 2014-06-29 DIAGNOSIS — I5032 Chronic diastolic (congestive) heart failure: Secondary | ICD-10-CM | POA: Diagnosis not present

## 2014-06-29 DIAGNOSIS — Z79899 Other long term (current) drug therapy: Secondary | ICD-10-CM | POA: Insufficient documentation

## 2014-06-29 DIAGNOSIS — I471 Supraventricular tachycardia: Secondary | ICD-10-CM | POA: Diagnosis not present

## 2014-06-29 MED ORDER — LISINOPRIL 20 MG PO TABS: 20.0000 mg | ORAL_TABLET | Freq: Every day | ORAL | Status: DC

## 2014-06-29 NOTE — Patient Instructions (Signed)
Continue to weigh daily and call for an overnight weight gain of >2 pounds or a weekly weight gain of >5 pounds.   Stop Entresto and resume Lisinopril tomorrow. Prescription has been sent to your pharmacy.

## 2014-06-29 NOTE — Progress Notes (Signed)
Subjective:    Patient ID: Melanie Lewis, female    DOB: 1934/11/25, 80 y.o.   MRN: [id number]  Congestive Heart Failure Presents for follow-up visit. The disease course has been stable. Associated symptoms include fatigue. Pertinent negatives include no abdominal pain, chest pain, chest pressure, edema, orthopnea, palpitations or shortness of breath. Past treatments include ACE inhibitors, beta blockers and salt and fluid restriction. The treatment provided significant relief. Her past medical history is significant for HTN and hyperthyroidism.  Other This is a recurrent (fatigue) problem. The current episode started 1 to 4 weeks ago. The problem occurs daily. The problem has been gradually worsening (since starting entresto). Associated symptoms include fatigue, headaches and neck pain. Pertinent negatives include no abdominal pain, chest pain, congestion, coughing, nausea, sore throat or weakness. Nothing aggravates the symptoms. She has tried nothing for the symptoms.  No Known Allergies  Prior to Admission medications   Medication Sig Start Date End Date Taking? Authorizing Provider  aspirin EC 81 MG tablet Take 81 mg by mouth daily.   Yes Historical Provider, MD  carvedilol (COREG) 6.25 MG tablet Take 6.25 mg by mouth 2 (two) times daily with a meal.   Yes Historical Provider, MD  donepezil (ARICEPT) 5 MG tablet Take 5 mg by mouth at bedtime.   Yes Historical Provider, MD  methimazole (TAPAZOLE) 10 MG tablet Take 10 mg by mouth 2 (two) times daily.    Yes Historical Provider, MD  potassium chloride SA (K-DUR,KLOR-CON) 20 MEQ tablet Take 1 tablet (20 mEq total) by mouth every other day. 06/24/14  Yes Enedina Finner, MD  torsemide (DEMADEX) 20 MG tablet Take 1 tablet (20 mg total) by mouth every other day. 06/24/14  Yes Enedina Finner, MD  lisinopril (PRINIVIL,ZESTRIL) 20 MG tablet Take 1 tablet (20 mg total) by mouth daily. 06/29/14   Delma Freeze, FNP   History  Substance Use Topics  .  Smoking status: Never Smoker   . Smokeless tobacco: Never Used  . Alcohol Use: 1.2 oz/week    2 Shots of liquor, 0 Standard drinks or equivalent per week       Review of Systems  Constitutional: Positive for fatigue. Negative for appetite change.  HENT: Negative for congestion, sore throat and trouble swallowing.   Eyes: Negative.   Respiratory: Negative for cough, shortness of breath and wheezing.   Cardiovascular: Negative for chest pain, palpitations and leg swelling.  Gastrointestinal: Negative for nausea, abdominal pain and abdominal distention.  Endocrine: Negative.   Genitourinary: Negative.   Musculoskeletal: Positive for neck pain. Negative for back pain.  Skin: Negative.   Allergic/Immunologic: Negative.   Neurological: Positive for headaches. Negative for dizziness and weakness.  Hematological: Negative for adenopathy. Does not bruise/bleed easily.  Psychiatric/Behavioral: Negative for sleep disturbance (sleeps on 2 pillows). The patient is not nervous/anxious.        Objective:   Physical Exam  Constitutional: She is oriented to person, place, and time. She appears well-developed and well-nourished.  HENT:  Head: Normocephalic and atraumatic.  Eyes: Conjunctivae are normal. Pupils are equal, round, and reactive to light.  Neck: Normal range of motion. Neck supple.  Cardiovascular: Normal rate and regular rhythm.   Pulmonary/Chest: Effort normal and breath sounds normal. She has no wheezes. She has no rales.  Abdominal: Soft. She exhibits no distension. There is no tenderness.  Musculoskeletal: She exhibits no edema or tenderness.  Neurological: She is alert and oriented to person, place, and time.  Skin: Skin is  warm and dry.  Psychiatric: She has a normal mood and affect. Her behavior is normal.  Nursing note and vitals reviewed.  BP 158/81 mmHg  Pulse 64  Resp 20  Ht 5\' 2"  (1.575 m)  Wt 195 lb (88.451 kg)  BMI 35.66 kg/m2  SpO2 97%  LMP  (LMP Unknown)     Assessment & Plan:  1: Chronic heart failure with preserved ejection fraction- Patient has been in the hospital recently and echocardiogram showed a dramatic improvement from her echo in January 2016. She says that she's noticed a decrease in her energy level as well as headaches since starting entresto. Since EF has improved and she's having some side effects, will go ahead and stop the entresto and resume her lisinopril 20mg  daily beginning tomorrow. She continues to weigh herself and reports a stable weight. Reminded to call for an overnight weight gain of >2 pounds or a weekly weight gain of >5 pounds. She is not adding any salt to her foods. She sees her cardiologist next week.  2: HTN- Blood pressure looks good today.  3: SVT- Patient says that since she has been out of the hospital, she hasn't had any more problems with a rapid heart beat. Heart rate looks good today. Again, sees her cardiologist next week.  Return in 3 months or sooner for any questions/problems before then.

## 2014-08-13 ENCOUNTER — Inpatient Hospital Stay
Admission: EM | Admit: 2014-08-13 | Discharge: 2014-08-15 | DRG: 287 | Disposition: A | Discharge status: Home / Self Care | Payer: Medicare Other | Attending: Internal Medicine | Admitting: Internal Medicine

## 2014-08-13 ENCOUNTER — Encounter: Payer: Self-pay | Admitting: Emergency Medicine

## 2014-08-13 ENCOUNTER — Emergency Department: Payer: Medicare Other

## 2014-08-13 DIAGNOSIS — I471 Supraventricular tachycardia: Principal | ICD-10-CM | POA: Diagnosis present

## 2014-08-13 DIAGNOSIS — I2510 Atherosclerotic heart disease of native coronary artery without angina pectoris: Secondary | ICD-10-CM | POA: Diagnosis present

## 2014-08-13 DIAGNOSIS — Z9049 Acquired absence of other specified parts of digestive tract: Secondary | ICD-10-CM | POA: Diagnosis present

## 2014-08-13 DIAGNOSIS — E0590 Thyrotoxicosis, unspecified without thyrotoxic crisis or storm: Secondary | ICD-10-CM | POA: Diagnosis present

## 2014-08-13 DIAGNOSIS — F0390 Unspecified dementia without behavioral disturbance: Secondary | ICD-10-CM | POA: Diagnosis present

## 2014-08-13 DIAGNOSIS — Z8249 Family history of ischemic heart disease and other diseases of the circulatory system: Secondary | ICD-10-CM

## 2014-08-13 DIAGNOSIS — I5030 Unspecified diastolic (congestive) heart failure: Secondary | ICD-10-CM | POA: Diagnosis present

## 2014-08-13 DIAGNOSIS — I252 Old myocardial infarction: Secondary | ICD-10-CM | POA: Diagnosis present

## 2014-08-13 DIAGNOSIS — Z7982 Long term (current) use of aspirin: Secondary | ICD-10-CM

## 2014-08-13 DIAGNOSIS — I209 Angina pectoris, unspecified: Secondary | ICD-10-CM

## 2014-08-13 DIAGNOSIS — E039 Hypothyroidism, unspecified: Secondary | ICD-10-CM | POA: Diagnosis present

## 2014-08-13 DIAGNOSIS — I248 Other forms of acute ischemic heart disease: Secondary | ICD-10-CM | POA: Diagnosis present

## 2014-08-13 DIAGNOSIS — Z90710 Acquired absence of both cervix and uterus: Secondary | ICD-10-CM

## 2014-08-13 DIAGNOSIS — I10 Essential (primary) hypertension: Secondary | ICD-10-CM | POA: Diagnosis present

## 2014-08-13 DIAGNOSIS — Z79899 Other long term (current) drug therapy: Secondary | ICD-10-CM

## 2014-08-13 LAB — COMPREHENSIVE METABOLIC PANEL WITH GFR
ALT: 15 U/L (ref 14–54)
AST: 25 U/L (ref 15–41)
Albumin: 3.7 g/dL (ref 3.5–5.0)
Alkaline Phosphatase: 106 U/L (ref 38–126)
Anion gap: 9 (ref 5–15)
BUN: 16 mg/dL (ref 6–20)
CO2: 25 mmol/L (ref 22–32)
Calcium: 8.9 mg/dL (ref 8.9–10.3)
Chloride: 104 mmol/L (ref 101–111)
Creatinine, Ser: 1.59 mg/dL — ABNORMAL HIGH (ref 0.44–1.00)
GFR calc Af Amer: 35 mL/min — ABNORMAL LOW
GFR calc non Af Amer: 30 mL/min — ABNORMAL LOW
Glucose, Bld: 147 mg/dL — ABNORMAL HIGH (ref 65–99)
Potassium: 4.2 mmol/L (ref 3.5–5.1)
Sodium: 138 mmol/L (ref 135–145)
Total Bilirubin: 0.4 mg/dL (ref 0.3–1.2)
Total Protein: 7.2 g/dL (ref 6.5–8.1)

## 2014-08-13 LAB — CBC WITH DIFFERENTIAL/PLATELET
BASOS PCT: 1 %
Basophils Absolute: 0.1 10*3/uL (ref 0–0.1)
EOS PCT: 1 %
Eosinophils Absolute: 0.1 10*3/uL (ref 0–0.7)
HCT: 42.6 % (ref 35.0–47.0)
Hemoglobin: 14 g/dL (ref 12.0–16.0)
LYMPHS ABS: 2.3 10*3/uL (ref 1.0–3.6)
Lymphocytes Relative: 30 %
MCH: 30.4 pg (ref 26.0–34.0)
MCHC: 32.8 g/dL (ref 32.0–36.0)
MCV: 92.8 fL (ref 80.0–100.0)
Monocytes Absolute: 0.5 10*3/uL (ref 0.2–0.9)
Monocytes Relative: 7 %
NEUTROS ABS: 4.5 10*3/uL (ref 1.4–6.5)
Neutrophils Relative %: 61 %
Platelets: 211 10*3/uL (ref 150–440)
RBC: 4.59 MIL/uL (ref 3.80–5.20)
RDW: 13.8 % (ref 11.5–14.5)
WBC: 7.5 10*3/uL (ref 3.6–11.0)

## 2014-08-13 LAB — LIPASE, BLOOD: Lipase: 43 U/L (ref 22–51)

## 2014-08-13 LAB — TROPONIN I: TROPONIN I: 0.22 ng/mL — AB (ref <0.031)

## 2014-08-13 MED ORDER — ASPIRIN EC 81 MG PO TBEC
81.0000 mg | DELAYED_RELEASE_TABLET | Freq: Every day | ORAL | Status: DC
  Administered 2014-08-14 – 2014-08-15 (×2): 81 mg via ORAL
  Filled 2014-08-13 (×2): qty 1

## 2014-08-13 MED ORDER — NITROGLYCERIN 0.4 MG SL SUBL: 0.4000 mg | SUBLINGUAL_TABLET | SUBLINGUAL | Status: DC | PRN

## 2014-08-13 MED ORDER — ONDANSETRON HCL 4 MG/2ML IJ SOLN: 4.0000 mg | Freq: Four times a day (QID) | INTRAMUSCULAR | Status: DC | PRN

## 2014-08-13 MED ORDER — HEPARIN SODIUM (PORCINE) 5000 UNIT/ML IJ SOLN
5000.0000 [IU] | Freq: Three times a day (TID) | INTRAMUSCULAR | Status: DC
  Administered 2014-08-13: 5000 [IU] via SUBCUTANEOUS
  Filled 2014-08-13: qty 1

## 2014-08-13 MED ORDER — ACETAMINOPHEN 500 MG PO TABS
ORAL_TABLET | ORAL | Status: AC
  Administered 2014-08-13: 1000 mg via ORAL
  Filled 2014-08-13: qty 2

## 2014-08-13 MED ORDER — SODIUM CHLORIDE 0.9 % IJ SOLN
3.0000 mL | Freq: Two times a day (BID) | INTRAMUSCULAR | Status: DC
  Administered 2014-08-13 – 2014-08-14 (×2): 3 mL via INTRAVENOUS

## 2014-08-13 MED ORDER — ACETAMINOPHEN 500 MG PO TABS
1000.0000 mg | ORAL_TABLET | Freq: Once | ORAL | Status: AC
  Administered 2014-08-13: 1000 mg via ORAL

## 2014-08-13 MED ORDER — ACETAMINOPHEN 325 MG PO TABS
650.0000 mg | ORAL_TABLET | Freq: Four times a day (QID) | ORAL | Status: DC | PRN
  Administered 2014-08-14: 650 mg via ORAL
  Filled 2014-08-13: qty 2

## 2014-08-13 MED ORDER — ACETAMINOPHEN 650 MG RE SUPP: 650.0000 mg | Freq: Four times a day (QID) | RECTAL | Status: DC | PRN

## 2014-08-13 MED ORDER — ONDANSETRON HCL 4 MG PO TABS: 4.0000 mg | ORAL_TABLET | Freq: Four times a day (QID) | ORAL | Status: DC | PRN

## 2014-08-13 MED ORDER — OXYCODONE HCL 5 MG PO TABS: 5.0000 mg | ORAL_TABLET | ORAL | Status: DC | PRN

## 2014-08-13 MED ORDER — MORPHINE SULFATE 2 MG/ML IJ SOLN: 2.0000 mg | INTRAMUSCULAR | Status: DC | PRN

## 2014-08-13 NOTE — ED Notes (Signed)
Pt presents to ED via EMS from personal home with c/o of substernal chest pain, non-radiating beginning approximately at 4 pm today. EMS states pt rated pain initially an 8/10. EMS states pt had self-administered x4 81 mg of aspirin prior to EMS arrival. EMS states nitroglycerin was not administered due to systolic BP <90. Pt arrived to ER alert and oriented x4, able to follow physical/verbal commands appropriately. Pt rates pain a 1/10, discomfort to central chest and upper, mid back region.

## 2014-08-13 NOTE — H&P (Signed)
[facility] [place] [facility]   PATIENT NAME: Melanie Lewis    MR#:  [id number]  DATE OF BIRTH:  [birth year]   DATE OF ADMISSION:  08/13/2014  PRIMARY CARE PHYSICIAN: Derwood Kaplan, MD   REQUESTING/REFERRING PHYSICIAN: Quale  CHIEF COMPLAINT:   Chief Complaint  Patient presents with  . Chest Pain   Palpitations HISTORY OF PRESENT ILLNESS:  Melanie Lewis  is a 80 y.o. female with a known history of coronary artery disease, congestive heart failure unspecified ejection fraction presenting with chest pain. She describes chest pain retrosternal in location, sharp in quality with radiation to the back 8/10 intensity and no worsening or relieving factors. Associated palpitations. Chest pain has since subsided. She continues to have palpitations in the emergency department which correlate with runs of nonsustained VT.  PAST MEDICAL HISTORY:   Past Medical History  Diagnosis Date  . CHF (congestive heart failure)   . Hypertension   . Coronary artery disease   . Dementia   . hyperthyroid   . Hyperthyroidism     PAST SURGICAL HISTORY:   Past Surgical History  Procedure Laterality Date  . Cardiac catheterization    . Partial hysterectomy    . Cholecystectomy    . Abdominal hysterectomy      SOCIAL HISTORY:   History  Substance Use Topics  . Smoking status: Never Smoker   . Smokeless tobacco: Never Used  . Alcohol Use: 1.2 oz/week    2 Shots of liquor, 0 Standard drinks or equivalent per week    FAMILY HISTORY:   Family History  Problem Relation Age of Onset  . Heart attack Sister   . Hypertension Sister     DRUG ALLERGIES:  No Known Allergies  REVIEW OF SYSTEMS:  REVIEW OF SYSTEMS:  CONSTITUTIONAL: Denies fevers, chills, fatigue, weakness.  EYES: Denies blurred vision, double vision, or eye pain.  EARS, NOSE, THROAT: Denies tinnitus, ear pain, hearing loss.  RESPIRATORY: denies cough, shortness of breath, wheezing   CARDIOVASCULAR: Positive chest pain, palpitations, denies edema.  GASTROINTESTINAL: Denies nausea, vomiting, diarrhea, abdominal pain.  GENITOURINARY: Denies dysuria, hematuria.  ENDOCRINE: Denies nocturia or thyroid problems. HEMATOLOGIC AND LYMPHATIC: Denies easy bruising or bleeding.  SKIN: Denies rash or lesions.  MUSCULOSKELETAL: Denies pain in neck, back, shoulder, knees, hips, or further arthritic symptoms.  NEUROLOGIC: Denies paralysis, paresthesias.  PSYCHIATRIC: Denies anxiety or depressive symptoms. Otherwise full review of systems performed by me is negative.   MEDICATIONS AT HOME:   Prior to Admission medications   Medication Sig Start Date End Date Taking? Authorizing Provider  aspirin EC 81 MG tablet Take 81 mg by mouth daily.   Yes Historical Provider, MD  carvedilol (COREG) 6.25 MG tablet Take 6.25 mg by mouth 2 (two) times daily.    Yes Historical Provider, MD  donepezil (ARICEPT) 5 MG tablet Take 5 mg by mouth at bedtime.   Yes Historical Provider, MD  lisinopril (PRINIVIL,ZESTRIL) 20 MG tablet Take 1 tablet (20 mg total) by mouth daily. 06/29/14  Yes Delma Freeze, FNP  methimazole (TAPAZOLE) 10 MG tablet Take 10 mg by mouth 2 (two) times daily.    Yes Historical Provider, MD  potassium chloride SA (K-DUR,KLOR-CON) 20 MEQ tablet Take 1 tablet (20 mEq total) by mouth every other day. Patient taking differently: Take 10 mEq by mouth daily.  06/24/14  Yes Enedina Finner, MD  torsemide (DEMADEX) 20 MG tablet Take 1 tablet (20 mg total) by mouth every  other day. Patient taking differently: Take 10 mg by mouth daily.  06/24/14  Yes Enedina Finner, MD      VITAL SIGNS:  Blood pressure 159/73, pulse 57, temperature 97.7 F (36.5 C), temperature source Oral, resp. rate 16, height 5\' 2"  (1.575 m), weight 224 lb (101.606 kg), SpO2 98 %.  PHYSICAL EXAMINATION:  VITAL SIGNS: Filed Vitals:   08/13/14 2325  BP: 159/73  Pulse: 57  Temp:   Resp: 16   GENERAL:79 y.o.female  currently in no acute distress.  HEAD: Normocephalic, atraumatic.  EYES: Pupils equal, round, reactive to light. Extraocular muscles intact. No scleral icterus.  MOUTH: Moist mucosal membrane. Dentition intact. No abscess noted.  EAR, NOSE, THROAT: Clear without exudates. No external lesions.  NECK: Supple. No thyromegaly. No nodules. No JVD.  PULMONARY: Clear to ascultation, without wheeze rails or rhonci. No use of accessory muscles, Good respiratory effort. good air entry bilaterally CHEST: Nontender to palpation.  CARDIOVASCULAR: S1 and S2. Bradycardic. No murmurs, rubs, or gallops. No edema. Pedal pulses 2+ bilaterally.  GASTROINTESTINAL: Soft, nontender, nondistended. No masses. Positive bowel sounds. No hepatosplenomegaly.  MUSCULOSKELETAL: No swelling, clubbing, or edema. Range of motion full in all extremities.  NEUROLOGIC: Cranial nerves II through XII are intact. No gross focal neurological deficits. Sensation intact. Reflexes intact.  SKIN: No ulceration, lesions, rashes, or cyanosis. Skin warm and dry. Turgor intact.  PSYCHIATRIC: Mood, affect within normal limits. The patient is awake, alert and oriented x 3. Insight, judgment intact.    LABORATORY PANEL:   CBC  Recent Labs Lab 08/13/14 2034  WBC 7.5  HGB 14.0  HCT 42.6  PLT 211   ------------------------------------------------------------------------------------------------------------------  Chemistries   Recent Labs Lab 08/13/14 2034  NA 138  K 4.2  CL 104  CO2 25  GLUCOSE 147*  BUN 16  CREATININE 1.59*  CALCIUM 8.9  AST 25  ALT 15  ALKPHOS 106  BILITOT 0.4   ------------------------------------------------------------------------------------------------------------------  Cardiac Enzymes  Recent Labs Lab 08/13/14 2034  TROPONINI 0.22*   ------------------------------------------------------------------------------------------------------------------  RADIOLOGY:  Dg Chest 2  View  08/13/2014   CLINICAL DATA:  Substernal chest pain since 4 p.m. today, non radiating  EXAM: CHEST  2 VIEW  COMPARISON:  06/23/2014  FINDINGS: Stable mild cardiac enlargement. Vascular pattern normal. Lungs clear. No effusion.  IMPRESSION: No active cardiopulmonary disease.   Electronically Signed   By: Esperanza Heir M.D.   On: 08/13/2014 20:45    EKG:   Orders placed or performed during the hospital encounter of 08/13/14  . ED EKG  . ED EKG    IMPRESSION AND PLAN:   80 year old African-American female history of coronary artery disease, diastolic congestive heart failure presenting with chest pain.  1. Chest pain, central: Aspirin, statin, telemetry, cardiac enzymes, consult cardiology follows with [facility] 2. Palpitations: Nonsustained VT, check magnesium correct electrolytes magnesium goal 2 potassium goal 4-5, if increases in frequency/intensity on initiate amiodarone, check TSH 3. Diastolic congestive heart failure: Lisinopril, Coreg 4. Venous thromboembolism prophylactic: Heparin subcutaneous    All the records are reviewed and case discussed with ED provider. Management plans discussed with the patient, family and they are in agreement.  CODE STATUS: Full  TOTAL TIME TAKING CARE OF THIS PATIENT: 35 minutes.    Latishia Suitt,  Mardi Mainland.D on 08/13/2014 at 11:37 PM  Between 7am to 6pm - Pager - [phone]  After 6pm: House Pager: - [phone]  [facility]  Office  [phone]  CC: Primary care physician; Maryellen Pile,  Mikeal Hawthorne, MD

## 2014-08-13 NOTE — ED Provider Notes (Signed)
[facility] Emergency Department Provider Note  ____________________________________________  Time seen: Approximately 8:55 PM  I have reviewed the triage vital signs and the nursing notes.   HISTORY  Chief Complaint Chest Pain    HPI Melanie Lewis is a 80 y.o. female history of coronary artery disease, SVT, congestive heart failure. Recently admitted for non-ST elevation MI.  Patient presents today stating about 5 PM she bent forward to pick up a hanger and noticed a sudden severe sensation of pain in her left chest that radiated into her left arm. She reports that she felt weak and dizzy with this, she had to take 4 baby aspirin at home for chest pain and called 911 as her symptoms were not improving after roughly an hour. She reports that her symptoms are presently improved in the emergency room and will relieve somewhat around 8:30 PM.  She denies no see any palpitations. Denies any nausea or vomiting. No weakness in arm or leg. No numbness or tingling.   Past Medical History  Diagnosis Date  . CHF (congestive heart failure)   . Hypertension   . Coronary artery disease   . Dementia   . hyperthyroid   . Hyperthyroidism     Patient Active Problem List   Diagnosis Date Noted  . Chronic diastolic heart failure 06/29/2014  . Paroxysmal SVT (supraventricular tachycardia) 06/23/2014  . Elevated troponin 06/23/2014  . Acute kidney injury 06/23/2014  . CHF (congestive heart failure) 06/23/2014  . Essential hypertension 04/19/2014  . Bradycardia 04/19/2014    Past Surgical History  Procedure Laterality Date  . Cardiac catheterization    . Partial hysterectomy    . Cholecystectomy    . Abdominal hysterectomy      Current Outpatient Rx  Name  Route  Sig  Dispense  Refill  . aspirin EC 81 MG tablet   Oral   Take 81 mg by mouth daily.         . carvedilol (COREG) 6.25 MG tablet   Oral   Take 6.25 mg by mouth 2 (two) times daily with a  meal.         . donepezil (ARICEPT) 5 MG tablet   Oral   Take 5 mg by mouth at bedtime.         Marland Kitchen lisinopril (PRINIVIL,ZESTRIL) 20 MG tablet   Oral   Take 1 tablet (20 mg total) by mouth daily.   30 tablet   5   . methimazole (TAPAZOLE) 10 MG tablet   Oral   Take 10 mg by mouth 2 (two) times daily.          . potassium chloride SA (K-DUR,KLOR-CON) 20 MEQ tablet   Oral   Take 1 tablet (20 mEq total) by mouth every other day.   30 tablet   0   . torsemide (DEMADEX) 20 MG tablet   Oral   Take 1 tablet (20 mg total) by mouth every other day.   30 tablet   0     Allergies Review of patient's allergies indicates no known allergies.  Family History  Problem Relation Age of Onset  . Heart attack Sister   . Hypertension Sister     Social History History  Substance Use Topics  . Smoking status: Never Smoker   . Smokeless tobacco: Never Used  . Alcohol Use: 1.2 oz/week    2 Shots of liquor, 0 Standard drinks or equivalent per week    Review of Systems Constitutional: No  fever/chills Eyes: No visual changes. ENT: No sore throat. Cardiovascular: See history of present illness Respiratory: Denies shortness of breath. Gastrointestinal: No abdominal pain.  No nausea, no vomiting.  No diarrhea.  No constipation. Genitourinary: Negative for dysuria. Musculoskeletal: Negative for back pain. Skin: Negative for rash. Neurological: Negative for headaches, focal weakness or numbness.  10-point ROS otherwise negative.  ____________________________________________   PHYSICAL EXAM:  VITAL SIGNS: ED Triage Vitals  Enc Vitals Group     BP 08/13/14 2015 135/71 mmHg     Pulse Rate 08/13/14 2015 68     Resp 08/13/14 2015 20     Temp 08/13/14 2015 97.7 F (36.5 C)     Temp Source 08/13/14 2015 Oral     SpO2 08/13/14 2015 100 %     Weight 08/13/14 2015 224 lb (101.606 kg)     Height 08/13/14 2015  (1.575 m)     Head Cir --      Peak Flow --      Pain Score  08/13/14 2015 1     Pain Loc --      Pain Edu? --      Excl. in GC? --    Constitutional: Alert and oriented. Well appearing and in no acute distress. Eyes: Conjunctivae are normal. PERRL. EOMI. Head: Atraumatic. Nose: No congestion/rhinnorhea. Mouth/Throat: Mucous membranes are moist.  Oropharynx non-erythematous. Neck: No stridor.   Cardiovascular: Normal rate, regular rhythm. Grossly normal heart sounds.  Good peripheral circulation. Respiratory: Normal respiratory effort.  No retractions. Lungs CTAB. Gastrointestinal: Soft and nontender. No distention. No abdominal bruits. No CVA tenderness. Musculoskeletal: No lower extremity tenderness nor edema.  No joint effusions. Neurologic:  Normal speech and language. No gross focal neurologic deficits are appreciated. No gait instability. No pronator drift. Normal cranial nerve exam. 5 of 5 strength in all extremities. No focal neuro deficits. Skin:  Skin is warm, dry and intact. No rash noted. Psychiatric: Mood and affect are normal. Speech and behavior are normal.  ____________________________________________   LABS (all labs ordered are listed, but only abnormal results are displayed)  Labs Reviewed  COMPREHENSIVE METABOLIC PANEL - Abnormal; Notable for the following:    Glucose, Bld 147 (*)    Creatinine, Ser 1.59 (*)    GFR calc non Af Amer 30 (*)    GFR calc Af Amer 35 (*)    All other components within normal limits  TROPONIN I - Abnormal; Notable for the following:    Troponin I 0.22 (*)    All other components within normal limits  CBC WITH DIFFERENTIAL/PLATELET  LIPASE, BLOOD   ____________________________________________  EKG  EKG #1 Time 820 Supraventricular tachycardia with ventricular rate of 150 Diffuse T-wave depressions Nonspecific T-wave abnormality QRS 80 QTc 480 interpreted as sinus tachycardia with ischemic changes noted  EKG #2 at 850 Sinus rhythm with first-degree AV block Q-wave noted in  inferior, nonspecific that slight T wave abnormality seen in lateral leads Ventricular rate 65 PR 2:30 QRS 80 QTc 420  Please note the patient was initially in SVT, this resolved on its own just minutes after arrival to the emergency room along with her chest discomfort which improved and is now only very minimal rating her pain as a slight ache as a 1 out of 10 in her left chest. ____________________________________________  RADIOLOGY  DG Chest 2 View (Final result) Result time: 08/13/14 20:45:11   Final result by Rad Results In Interface (08/13/14 20:45:11)   Narrative:   CLINICAL DATA:  Substernal chest pain since 4 p.m. today, non radiating  EXAM: CHEST 2 VIEW  COMPARISON: 06/23/2014  FINDINGS: Stable mild cardiac enlargement. Vascular pattern normal. Lungs clear. No effusion.  IMPRESSION: No active cardiopulmonary disease.    ____________________________________________   PROCEDURES  Procedure(s) performed: None  Critical Care performed: No  ____________________________________________   INITIAL IMPRESSION / ASSESSMENT AND PLAN / ED COURSE  Pertinent labs & imaging results that were available during my care of the patient were reviewed by me and considered in my medical decision making (see chart for details).  Patient had an episode of what appears to be SVT which may have triggered her symptoms, alternatively acute coronary syndrome is a consideration. The patient does have a positive troponin at 0.22 which is greater than her last previous straw. She is believed to have had a possible n-STEMI at the time of her last admission.  Reassuring that she feels improved now, but is having 1 out of 10 chest discomfort. I do not believe that she currently warrants heparin, but does warrant admission for cardiology consultation and rule out non-ST elevation MI. ____________________________________________   FINAL CLINICAL IMPRESSION(S) / ED DIAGNOSES  Final  diagnoses:  Ischemic chest pain  SVT (supraventricular tachycardia)      Sharyn Creamer, MD 08/13/14 2112

## 2014-08-14 ENCOUNTER — Encounter: Payer: Self-pay | Admitting: Cardiology

## 2014-08-14 ENCOUNTER — Encounter
Admission: EM | Disposition: A | Discharge status: Home / Self Care | Payer: Self-pay | Source: Home / Self Care | Attending: Internal Medicine

## 2014-08-14 DIAGNOSIS — Z9049 Acquired absence of other specified parts of digestive tract: Secondary | ICD-10-CM | POA: Diagnosis present

## 2014-08-14 DIAGNOSIS — Z8249 Family history of ischemic heart disease and other diseases of the circulatory system: Secondary | ICD-10-CM | POA: Diagnosis not present

## 2014-08-14 DIAGNOSIS — F0390 Unspecified dementia without behavioral disturbance: Secondary | ICD-10-CM | POA: Diagnosis present

## 2014-08-14 DIAGNOSIS — I2510 Atherosclerotic heart disease of native coronary artery without angina pectoris: Secondary | ICD-10-CM | POA: Diagnosis present

## 2014-08-14 DIAGNOSIS — Z7982 Long term (current) use of aspirin: Secondary | ICD-10-CM | POA: Diagnosis not present

## 2014-08-14 DIAGNOSIS — I248 Other forms of acute ischemic heart disease: Secondary | ICD-10-CM | POA: Diagnosis present

## 2014-08-14 DIAGNOSIS — E039 Hypothyroidism, unspecified: Secondary | ICD-10-CM | POA: Diagnosis present

## 2014-08-14 DIAGNOSIS — I10 Essential (primary) hypertension: Secondary | ICD-10-CM | POA: Diagnosis present

## 2014-08-14 DIAGNOSIS — I471 Supraventricular tachycardia: Secondary | ICD-10-CM | POA: Diagnosis present

## 2014-08-14 DIAGNOSIS — E0590 Thyrotoxicosis, unspecified without thyrotoxic crisis or storm: Secondary | ICD-10-CM | POA: Diagnosis present

## 2014-08-14 DIAGNOSIS — Z79899 Other long term (current) drug therapy: Secondary | ICD-10-CM | POA: Diagnosis not present

## 2014-08-14 DIAGNOSIS — Z90710 Acquired absence of both cervix and uterus: Secondary | ICD-10-CM | POA: Diagnosis not present

## 2014-08-14 DIAGNOSIS — I5030 Unspecified diastolic (congestive) heart failure: Secondary | ICD-10-CM | POA: Diagnosis present

## 2014-08-14 DIAGNOSIS — I252 Old myocardial infarction: Secondary | ICD-10-CM | POA: Diagnosis present

## 2014-08-14 HISTORY — PX: CARDIAC CATHETERIZATION: SHX172

## 2014-08-14 LAB — APTT: aPTT: 33 seconds (ref 24–36)

## 2014-08-14 LAB — HEPARIN LEVEL (UNFRACTIONATED)
Heparin Unfractionated: 0.55 IU/mL (ref 0.30–0.70)
Heparin Unfractionated: 0.1 IU/mL — ABNORMAL LOW (ref 0.30–0.70)

## 2014-08-14 LAB — PROTIME-INR
INR: 1.05
Prothrombin Time: 13.9 seconds (ref 11.4–15.0)

## 2014-08-14 LAB — TSH: TSH: 56.436 u[IU]/mL — ABNORMAL HIGH (ref 0.350–4.500)

## 2014-08-14 LAB — MAGNESIUM: MAGNESIUM: 1.9 mg/dL (ref 1.7–2.4)

## 2014-08-14 LAB — TROPONIN I: Troponin I: 5.32 ng/mL — ABNORMAL HIGH (ref <0.031)

## 2014-08-14 LAB — T4, FREE: FREE T4: 0.38 ng/dL — AB (ref 0.61–1.12)

## 2014-08-14 SURGERY — LEFT HEART CATH AND CORONARY ANGIOGRAPHY
Anesthesia: Moderate Sedation

## 2014-08-14 MED ORDER — IOHEXOL 300 MG/ML  SOLN
INTRAMUSCULAR | Status: DC | PRN
  Administered 2014-08-14: 80 mL via INTRA_ARTERIAL
  Administered 2014-08-14: 30 mL via INTRA_ARTERIAL

## 2014-08-14 MED ORDER — ONDANSETRON HCL 4 MG/2ML IJ SOLN: 4.0000 mg | Freq: Four times a day (QID) | INTRAMUSCULAR | Status: DC | PRN

## 2014-08-14 MED ORDER — HEPARIN BOLUS VIA INFUSION
2000.0000 [IU] | Freq: Once | INTRAVENOUS | Status: AC
Start: 2014-08-14 — End: 2014-08-14
  Administered 2014-08-14: 2000 [IU] via INTRAVENOUS
  Filled 2014-08-14: qty 2000

## 2014-08-14 MED ORDER — SODIUM CHLORIDE 0.9 % IJ SOLN
3.0000 mL | Freq: Two times a day (BID) | INTRAMUSCULAR | Status: DC
  Administered 2014-08-15: 3 mL via INTRAVENOUS

## 2014-08-14 MED ORDER — HEPARIN (PORCINE) IN NACL 100-0.45 UNIT/ML-% IJ SOLN
900.0000 [IU]/h | INTRAMUSCULAR | Status: DC
  Administered 2014-08-14: 900 [IU]/h via INTRAVENOUS
  Filled 2014-08-14 (×2): qty 250

## 2014-08-14 MED ORDER — SODIUM CHLORIDE 0.9 % WEIGHT BASED INFUSION: 3.0000 mL/kg/h | INTRAVENOUS | Status: DC

## 2014-08-14 MED ORDER — SIMVASTATIN 10 MG PO TABS
10.0000 mg | ORAL_TABLET | Freq: Every day | ORAL | Status: DC
  Administered 2014-08-14: 10 mg via ORAL
  Filled 2014-08-14: qty 1

## 2014-08-14 MED ORDER — FENTANYL CITRATE (PF) 100 MCG/2ML IJ SOLN
INTRAMUSCULAR | Status: AC
  Filled 2014-08-14: qty 2

## 2014-08-14 MED ORDER — AMIODARONE HCL IN DEXTROSE 360-4.14 MG/200ML-% IV SOLN
60.0000 mg/h | INTRAVENOUS | Status: AC
  Administered 2014-08-14: 60 mg/h via INTRAVENOUS
  Filled 2014-08-14: qty 200

## 2014-08-14 MED ORDER — ACETAMINOPHEN 325 MG PO TABS: 650.0000 mg | ORAL_TABLET | ORAL | Status: DC | PRN

## 2014-08-14 MED ORDER — FENTANYL CITRATE (PF) 100 MCG/2ML IJ SOLN
INTRAMUSCULAR | Status: DC | PRN
  Administered 2014-08-14: 50 ug via INTRAVENOUS

## 2014-08-14 MED ORDER — SODIUM CHLORIDE 0.9 % WEIGHT BASED INFUSION
3.0000 mL/kg/h | INTRAVENOUS | Status: AC
  Administered 2014-08-14: 3 mL/kg/h via INTRAVENOUS

## 2014-08-14 MED ORDER — SODIUM CHLORIDE 0.9 % IJ SOLN: 3.0000 mL | INTRAMUSCULAR | Status: DC | PRN

## 2014-08-14 MED ORDER — AMIODARONE HCL IN DEXTROSE 360-4.14 MG/200ML-% IV SOLN
30.0000 mg/h | INTRAVENOUS | Status: DC
  Administered 2014-08-14: 30 mg/h via INTRAVENOUS
  Filled 2014-08-14 (×3): qty 200

## 2014-08-14 MED ORDER — DONEPEZIL HCL 5 MG PO TABS
5.0000 mg | ORAL_TABLET | Freq: Every day | ORAL | Status: DC
  Administered 2014-08-14 (×2): 5 mg via ORAL
  Filled 2014-08-14 (×2): qty 1

## 2014-08-14 MED ORDER — HEPARIN (PORCINE) IN NACL 2-0.9 UNIT/ML-% IJ SOLN
INTRAMUSCULAR | Status: AC
  Filled 2014-08-14: qty 500

## 2014-08-14 MED ORDER — AMIODARONE LOAD VIA INFUSION
150.0000 mg | Freq: Once | INTRAVENOUS | Status: AC
  Administered 2014-08-14: 150 mg via INTRAVENOUS
  Filled 2014-08-14: qty 83.34

## 2014-08-14 MED ORDER — AMIODARONE HCL IN DEXTROSE 360-4.14 MG/200ML-% IV SOLN: 60.0000 mg/h | INTRAVENOUS | Status: DC

## 2014-08-14 MED ORDER — LISINOPRIL 20 MG PO TABS
20.0000 mg | ORAL_TABLET | Freq: Every day | ORAL | Status: DC
  Administered 2014-08-14 – 2014-08-15 (×2): 20 mg via ORAL
  Filled 2014-08-14 (×2): qty 1

## 2014-08-14 MED ORDER — AMIODARONE HCL 200 MG PO TABS
200.0000 mg | ORAL_TABLET | Freq: Two times a day (BID) | ORAL | Status: DC
  Administered 2014-08-14 (×2): 200 mg via ORAL
  Filled 2014-08-14 (×3): qty 1

## 2014-08-14 MED ORDER — MIDAZOLAM HCL 2 MG/2ML IJ SOLN
INTRAMUSCULAR | Status: AC
  Filled 2014-08-14: qty 2

## 2014-08-14 MED ORDER — SODIUM CHLORIDE 0.9 % WEIGHT BASED INFUSION: 1.0000 mL/kg/h | INTRAVENOUS | Status: DC

## 2014-08-14 MED ORDER — SODIUM CHLORIDE 0.9 % IV SOLN: INTRAVENOUS | Status: DC

## 2014-08-14 MED ORDER — HEPARIN (PORCINE) IN NACL 2-0.9 UNIT/ML-% IJ SOLN
INTRAMUSCULAR | Status: AC
  Filled 2014-08-14: qty 1000

## 2014-08-14 MED ORDER — SODIUM CHLORIDE 0.9 % IJ SOLN
3.0000 mL | Freq: Two times a day (BID) | INTRAMUSCULAR | Status: DC
  Administered 2014-08-14: 3 mL via INTRAVENOUS

## 2014-08-14 MED ORDER — SODIUM CHLORIDE 0.9 % IV SOLN: 250.0000 mL | INTRAVENOUS | Status: DC | PRN

## 2014-08-14 MED ORDER — MIDAZOLAM HCL 2 MG/2ML IJ SOLN
INTRAMUSCULAR | Status: DC | PRN
  Administered 2014-08-14: 1 mg via INTRAVENOUS

## 2014-08-14 MED ORDER — ASPIRIN 81 MG PO CHEW
81.0000 mg | CHEWABLE_TABLET | ORAL | Status: DC
Start: 2014-08-15 — End: 2014-08-15

## 2014-08-14 MED ORDER — CARVEDILOL 6.25 MG PO TABS
6.2500 mg | ORAL_TABLET | Freq: Two times a day (BID) | ORAL | Status: DC
  Administered 2014-08-14 – 2014-08-15 (×3): 6.25 mg via ORAL
  Filled 2014-08-14 (×3): qty 1

## 2014-08-14 MED ORDER — METHIMAZOLE 10 MG PO TABS
10.0000 mg | ORAL_TABLET | Freq: Two times a day (BID) | ORAL | Status: DC
  Administered 2014-08-14 – 2014-08-15 (×3): 10 mg via ORAL
  Filled 2014-08-14 (×4): qty 1

## 2014-08-14 MED ORDER — AMIODARONE HCL IN DEXTROSE 360-4.14 MG/200ML-% IV SOLN: 30.0000 mg/h | INTRAVENOUS | Status: DC

## 2014-08-14 SURGICAL SUPPLY — 9 items
CATH INFINITI 5FR ANG PIGTAIL (CATHETERS) ×3 IMPLANT
CATH INFINITI 5FR JL4 (CATHETERS) ×3 IMPLANT
CATH INFINITI JR4 5F (CATHETERS) ×3 IMPLANT
DEVICE CLOSURE MYNXGRIP 5F (Vascular Products) ×3 IMPLANT
KIT MANI 3VAL PERCEP (MISCELLANEOUS) ×3 IMPLANT
NEEDLE PERC 18GX7CM (NEEDLE) ×3 IMPLANT
PACK CARDIAC CATH (CUSTOM PROCEDURE TRAY) ×3 IMPLANT
SHEATH AVANTI 5FR X 11CM (SHEATH) ×3 IMPLANT
WIRE EMERALD 3MM-J .035X150CM (WIRE) ×3 IMPLANT

## 2014-08-14 NOTE — Progress Notes (Signed)
Pt had 2 runs of SVT since admitted to the floor, MD diamond made aware. Pt's HR has been in low 50's and amiodarone is infusing at 33.46ml/hr. Dr. Was notified as well. No new orders received.

## 2014-08-14 NOTE — Progress Notes (Signed)
Pt's troponin up to 5.32 MD Hower notified.

## 2014-08-14 NOTE — Progress Notes (Signed)
Pt returned from cath lab.  A&O x3.  Bedrest finished.  Mynx closure with dry dressing to rt groin.  Pedal pulses equal bil.  IVF infusing well rt fa.  Cardiac monitor in place, pt denies chest pain.  Denies need at this time.  CB in reach, Sr up x2.

## 2014-08-14 NOTE — Progress Notes (Signed)
To cath lab via bed.

## 2014-08-14 NOTE — Progress Notes (Signed)
[facility] - [place] at [facility]                                                                                                                                                                                            Patient Demographics   Melanie Lewis, is a 80 y.o. female, DOB - 08-09-1934, ZOX:[id number]  Admit date - 08/13/2014   Admitting Physician Wyatt Haste, MD  Outpatient Primary MD for the patient is Derwood Kaplan, MD   LOS -   Subjective: Patient denies chest pain currently, troponin was elevated. Patient was started on amiodarone for possible V. tach however per cardiology they feel this is more SVT.     Review of Systems:   CONSTITUTIONAL: No documented fever. No fatigue, weakness. No weight gain, no weight loss.  EYES: No blurry or double vision.  ENT: No tinnitus. No postnasal drip. No redness of the oropharynx.  RESPIRATORY: No cough, no wheeze, no hemoptysis. No dyspnea.  CARDIOVASCULAR: No chest pain currently. No orthopnea. No palpitations. No syncope.  GASTROINTESTINAL: No nausea, no vomiting or diarrhea. No abdominal pain. No melena or hematochezia.  GENITOURINARY: No dysuria or hematuria.  ENDOCRINE: No polyuria or nocturia. No heat or cold intolerance.  HEMATOLOGY: No anemia. No bruising. No bleeding.  INTEGUMENTARY: No rashes. No lesions.  MUSCULOSKELETAL: No arthritis. No swelling. No gout.  NEUROLOGIC: No numbness, tingling, or ataxia. No seizure-type activity.  PSYCHIATRIC: No anxiety. No insomnia. No ADD.    Vitals:   Filed Vitals:   08/14/14 0327 08/14/14 0754 08/14/14 1128 08/14/14 1223  BP: 152/63 172/67 165/64   Pulse: 57 58 50 49  Temp: 98 F (36.7 C) 97.5 F (36.4 C) 98.3 F (36.8 C) 98.1 F (36.7 C)  TempSrc: Oral Oral Oral Oral  Resp: Height:      Weight:      SpO2: 100% 100% 99% 98%    Wt Readings from Last 3 Encounters:  08/13/14 101.606 kg (224 lb)  06/29/14 88.451 kg (195  lb)  06/24/14 88.406 kg (194 lb 14.4 oz)     Intake/Output Summary (Last 24 hours) at 08/14/14 1239 Last data filed at 08/14/14 1207  Gross per 24 hour  Intake    240 ml  Output    200 ml  Net     40 ml    Physical Exam:   GENERAL: Pleasant-appearing in no apparent distress.  HEAD, EYES, EARS, NOSE AND THROAT: Atraumatic, normocephalic. Extraocular muscles are intact. Pupils equal and reactive to light. Sclerae anicteric. No conjunctival injection. No oro-pharyngeal erythema.  NECK:  Supple. There is no jugular venous distention. No bruits, no lymphadenopathy, no thyromegaly.  HEART: Regular rate and rhythm, tachycardic. No murmurs, no rubs, no clicks.  LUNGS: Clear to auscultation bilaterally. No rales or rhonchi. No wheezes.  ABDOMEN: Soft, flat, nontender, nondistended. Has good bowel sounds. No hepatosplenomegaly appreciated.  EXTREMITIES: No evidence of any cyanosis, clubbing, or peripheral edema.  +2 pedal and radial pulses bilaterally.  NEUROLOGIC: The patient is alert, awake, and oriented x3 with no focal motor or sensory deficits appreciated bilaterally.  SKIN: Moist and warm with no rashes appreciated.  Psych: Not anxious, depressed LN: No inguinal LN enlargement    Antibiotics   Anti-infectives    None      Medications   Scheduled Meds: . [START ON 08/15/2014] aspirin  81 mg Oral Pre-Cath  . aspirin EC  81 mg Oral Daily  . carvedilol  6.25 mg Oral BID  . donepezil  5 mg Oral QHS  . lisinopril  20 mg Oral Daily  . methimazole  10 mg Oral BID  . simvastatin  10 mg Oral q1800  . sodium chloride  3 mL Intravenous Q12H  . sodium chloride  3 mL Intravenous Q12H   Continuous Infusions: . sodium chloride    . [START ON 08/15/2014] sodium chloride     Followed by  . [START ON 08/15/2014] sodium chloride    . amiodarone 30 mg/hr (08/14/14 0617)  . heparin 900 Units/hr (08/14/14 0401)   PRN Meds:.sodium chloride, acetaminophen **OR** acetaminophen, morphine injection,  nitroGLYCERIN, ondansetron **OR** ondansetron (ZOFRAN) IV, oxyCODONE, sodium chloride   Data Review:   Micro Results No results found for this or any previous visit (from the past 240 hour(s)).  Radiology Reports Dg Chest 2 View  08/13/2014   CLINICAL DATA:  Substernal chest pain since 4 p.m. today, non radiating  EXAM: CHEST  2 VIEW  COMPARISON:  06/23/2014  FINDINGS: Stable mild cardiac enlargement. Vascular pattern normal. Lungs clear. No effusion.  IMPRESSION: No active cardiopulmonary disease.   Electronically Signed   By: Esperanza Heir M.D.   On: 08/13/2014 20:45     CBC  Recent Labs Lab 08/13/14 2034  WBC 7.5  HGB 14.0  HCT 42.6  PLT 211  MCV 92.8  MCH 30.4  MCHC 32.8  RDW 13.8  LYMPHSABS 2.3  MONOABS 0.5  EOSABS 0.1  BASOSABS 0.1    Chemistries   Recent Labs Lab 08/13/14 2034 08/14/14 0049  NA 138  --   K 4.2  --   CL 104  --   CO2 25  --   GLUCOSE 147*  --   BUN 16  --   CREATININE 1.59*  --   CALCIUM 8.9  --   MG  --  1.9  AST 25  --   ALT 15  --   ALKPHOS 106  --   BILITOT 0.4  --    ------------------------------------------------------------------------------------------------------------------ estimated creatinine clearance is 33.1 mL/min (by C-G formula based on Cr of 1.59). ------------------------------------------------------------------------------------------------------------------ No results for input(s): HGBA1C in the last 72 hours. ------------------------------------------------------------------------------------------------------------------ No results for input(s): CHOL, HDL, LDLCALC, TRIG, CHOLHDL, LDLDIRECT in the last 72 hours. ------------------------------------------------------------------------------------------------------------------  Recent Labs  08/13/14 2034  TSH 56.436*   ------------------------------------------------------------------------------------------------------------------ No results for input(s):  VITAMINB12, FOLATE, FERRITIN, TIBC, IRON, RETICCTPCT in the last 72 hours.  Coagulation profile  Recent Labs Lab 08/13/14 2024  INR 1.05    No results for input(s): DDIMER in the last 72 hours.  Cardiac Enzymes  Recent Labs Lab 08/13/14 2034 08/14/14 0049  TROPONINI 0.22* 5.32*   ------------------------------------------------------------------------------------------------------------------ Invalid input(s): POCBNP    Assessment & Plan   80 year old African-American female history of coronary artery disease, diastolic congestive heart failure presenting with chest pain.  1. Non-ST MI acute: Continue aspirin, Coreg plan for cardiac catheterization later today 2. Palpitations: Cardiology feels this is more SVT stop amiodarone,  3. Diastolic congestive heart failure: currently compensated continue  Lisinopril, Coreg 4Dementia continue Aricept 5. Hypertension continue lisinopril 6. Hypothyroidism; continue methimazole, TSH is significantly elevated I will check free T3 and free T4      Code Status Orders        Start     Ordered   08/13/14 2206  Full code   Continuous     08/13/14 2206           Consults  cardiology   DVT ProphylaxisSCDs   Lab Results  Component Value Date   PLT 211 08/13/2014     Time Spent in minutes   35 minutes  .   Auburn Bilberry M.D on 08/14/2014 at 12:39 PM  Between 7am to 6pm - Pager - [phone]  After 6pm go to [email or web address] - password [email or web address]  [facility]   Office  [phone]

## 2014-08-14 NOTE — Progress Notes (Signed)
ANTICOAGULATION CONSULT NOTE - Initial Consult  Pharmacy Consult for Heparin Indication: ACS/STEMI  No Known Allergies  Patient Measurements: Height:  (157.5 cm) Weight: 224 lb (101.606 kg) IBW/kg (Calculated) : 50.1 Heparin Dosing Weight: 74.3 kg  Vital Signs: Temp: 98.4 F (36.9 C) (08/02 0122) Temp Source: Oral (08/02 0122) BP: 186/70 mmHg (08/02 0122) Pulse Rate: 56 (08/02 0122)  Labs:  Recent Labs  08/13/14 2034 08/14/14 0049  HGB 14.0  --   HCT 42.6  --   PLT 211  --   CREATININE 1.59*  --   TROPONINI 0.22* 5.32*    Estimated Creatinine Clearance: 33.1 mL/min (by C-G formula based on Cr of 1.59).   Medical History: Past Medical History  Diagnosis Date  . CHF (congestive heart failure)   . Hypertension   . Coronary artery disease   . Dementia   . hyperthyroid   . Hyperthyroidism     Medications:  Prescriptions prior to admission  Medication Sig Dispense Refill Last Dose  . aspirin EC 81 MG tablet Take 81 mg by mouth daily.   08/13/2014 at Unknown time  . carvedilol (COREG) 6.25 MG tablet Take 6.25 mg by mouth 2 (two) times daily.    08/13/2014 at 1600  . donepezil (ARICEPT) 5 MG tablet Take 5 mg by mouth at bedtime.   08/12/2014 at Unknown time  . lisinopril (PRINIVIL,ZESTRIL) 20 MG tablet Take 1 tablet (20 mg total) by mouth daily. 30 tablet 5 08/13/2014 at Unknown time  . methimazole (TAPAZOLE) 10 MG tablet Take 10 mg by mouth 2 (two) times daily.    08/13/2014 at Unknown time  . potassium chloride SA (K-DUR,KLOR-CON) 20 MEQ tablet Take 1 tablet (20 mEq total) by mouth every other day. (Patient taking differently: Take 10 mEq by mouth daily. ) 30 tablet 0 08/13/2014 at Unknown time  . torsemide (DEMADEX) 20 MG tablet Take 1 tablet (20 mg total) by mouth every other day. (Patient taking differently: Take 10 mg by mouth daily. ) 30 tablet 0 08/13/2014 at Unknown time   Scheduled:  . aspirin EC  81 mg Oral Daily  . carvedilol  6.25 mg Oral BID  . donepezil  5  mg Oral QHS  . heparin  2,000 Units Intravenous Once  . lisinopril  20 mg Oral Daily  . methimazole  10 mg Oral BID  . sodium chloride  3 mL Intravenous Q12H   Infusions:  . amiodarone 60 mg/hr (08/14/14 0107)   Followed by  . amiodarone    . heparin     PRN: acetaminophen **OR** acetaminophen, morphine injection, nitroGLYCERIN, ondansetron **OR** ondansetron (ZOFRAN) IV, oxyCODONE  Assessment: 80 y/o F admitted with known h/o CAD admitted with CP, STEMI.   Goal of Therapy:  Heparin level 0.3-0.7 units/ml Monitor platelets by anticoagulation protocol: Yes   Plan:  Patient received [place] heparin last pm so will give half bolus.  Give 2000 units bolus x 1 Start heparin infusion at 900 units/hr Check anti-Xa level in 8 hours and daily while on heparin Continue to monitor H&H and platelets  Melanie Lewis D 08/14/2014,1:55 AM

## 2014-08-14 NOTE — Progress Notes (Signed)
Assessment completed.  IV heparin at 900u/hr infusing well rt fa.  Amiodarone at 16.7mg /hr infusing well rt ac.  No distress on ra.  Cardiac monitor in place, pt denies chest pain at this time.  Lungs diminished bil.  Fall precautions in place. Denies need, cb in reach, Sr up x 2, bed alarm on.

## 2014-08-14 NOTE — Consult Note (Signed)
[facility] Cardiology  CARDIOLOGY CONSULT NOTE  Patient ID: BRAXTYN DORFF MRN: [id number] DOB/AGE: [birth year] 80 y.o.  Admit date: 08/13/2014 Referring Physician Enedina Finner MD Primary Physician [place] Primary Cardiologist [facility] Reason for Consultation NSTEMI  HPI: 80 year old female referred for chest pain, non-ST elevation myocardial infarction, and paroxysmal SVT.the patient was or usual state of health till she presented to [facility] emergency room with chest pain, tachycardia with paroxysmal SVT. Patient's converted to sinus rhythm, currently on amiodarone drip.the patient reports she does experience midsternal chest discomfort. The patient has ruled in for non-ST elevation myocardial infarction with peak troponin of 5.32.  Review of systems complete and found to be negative unless listed above     Past Medical History  Diagnosis Date  . CHF (congestive heart failure)   . Hypertension   . Coronary artery disease   . Dementia   . hyperthyroid   . Hyperthyroidism     Past Surgical History  Procedure Laterality Date  . Cardiac catheterization    . Partial hysterectomy    . Cholecystectomy    . Abdominal hysterectomy      Prescriptions prior to admission  Medication Sig Dispense Refill Last Dose  . aspirin EC 81 MG tablet Take 81 mg by mouth daily.   08/13/2014 at Unknown time  . carvedilol (COREG) 6.25 MG tablet Take 6.25 mg by mouth 2 (two) times daily.    08/13/2014 at 1600  . donepezil (ARICEPT) 5 MG tablet Take 5 mg by mouth at bedtime.   08/12/2014 at Unknown time  . lisinopril (PRINIVIL,ZESTRIL) 20 MG tablet Take 1 tablet (20 mg total) by mouth daily. 30 tablet 5 08/13/2014 at Unknown time  . methimazole (TAPAZOLE) 10 MG tablet Take 10 mg by mouth 2 (two) times daily.    08/13/2014 at Unknown time  . potassium chloride SA (K-DUR,KLOR-CON) 20 MEQ tablet Take 1 tablet (20 mEq total) by mouth every other day. (Patient taking differently: Take 10 mEq by mouth daily. ) 30 tablet 0 08/13/2014  at Unknown time  . torsemide (DEMADEX) 20 MG tablet Take 1 tablet (20 mg total) by mouth every other day. (Patient taking differently: Take 10 mg by mouth daily. ) 30 tablet 0 08/13/2014 at Unknown time   History   Social History  . Marital Status: Widowed    Spouse Name: N/A  . Number of Children: N/A  . Years of Education: N/A   Occupational History  . Not on file.   Social History Main Topics  . Smoking status: Never Smoker   . Smokeless tobacco: Never Used  . Alcohol Use: 1.2 oz/week    2 Shots of liquor, 0 Standard drinks or equivalent per week  . Drug Use: No  . Sexual Activity: Not on file   Other Topics Concern  . Not on file   Social History Narrative    Family History  Problem Relation Age of Onset  . Heart attack Sister   . Hypertension Sister       Review of systems complete and found to be negative unless listed above      PHYSICAL EXAM  General: Well developed, well nourished, in no acute distress HEENT:  Normocephalic and atramatic Neck:  No JVD.  Lungs: Clear bilaterally to auscultation and percussion. Heart: HRRR . Normal S1 and S2 without gallops or murmurs.  Abdomen: Bowel sounds are positive, abdomen soft and non-tender  Msk:  Back normal, normal gait. Normal strength and tone for age. Extremities: No clubbing, cyanosis  or edema.   Neuro: Alert and oriented X 3. Psych:  Good affect, responds appropriately  Labs:   Lab Results  Component Value Date   WBC 7.5 08/13/2014   HGB 14.0 08/13/2014   HCT 42.6 08/13/2014   MCV 92.8 08/13/2014   PLT 211 08/13/2014    Recent Labs Lab 08/13/14 2034  NA 138  K 4.2  CL 104  CO2 25  BUN 16  CREATININE 1.59*  CALCIUM 8.9  PROT 7.2  BILITOT 0.4  ALKPHOS 106  ALT 15  AST 25  GLUCOSE 147*   Lab Results  Component Value Date   TROPONINI 5.32* 08/14/2014    Lab Results  Component Value Date   CHOL 155 02/08/2014   Lab Results  Component Value Date   HDL 52 02/08/2014   Lab Results   Component Value Date   LDLCALC 89 02/08/2014   Lab Results  Component Value Date   TRIG 69 02/08/2014   No results found for: CHOLHDL No results found for: LDLDIRECT    Radiology: Dg Chest 2 View  08/13/2014   CLINICAL DATA:  Substernal chest pain since 4 p.m. today, non radiating  EXAM: CHEST  2 VIEW  COMPARISON:  06/23/2014  FINDINGS: Stable mild cardiac enlargement. Vascular pattern normal. Lungs clear. No effusion.  IMPRESSION: No active cardiopulmonary disease.   Electronically Signed   By: Esperanza Heir M.D.   On: 08/13/2014 20:45    EKG: normal sinus rhythm  ASSESSMENT AND PLAN:   1. Non-ST elevation myocardial infarction, with diagnostic elevated troponin, currently without chest pain on heparin drip 2.paroxysmal SVT, currently in sinus rhythm, on amiodarone drip  Recommendations  1. Continue heparin drip for now 2. Proceed with cardiac catheterization and selective coronary arteriography and possible PCI. The risks, benefits alternatives to cardiac catheterization and PCI were explained to the patient and informed written consent was obtained  Signed: Rahaf Carbonell MD,PhD, [facility] 08/14/2014, 9:32 AM

## 2014-08-14 NOTE — Progress Notes (Signed)
Report to tanya on telemetry   Check right groin for bleeding or hematoma.  Patient will be on bedrest for 2 hours post sheath pull---out of bed at 15:10.  Bilateral pulses are 3's DP's.Marland Kitchen

## 2014-08-14 NOTE — Progress Notes (Signed)
ANTICOAGULATION CONSULT NOTE - Follow-up Consult  Pharmacy Consult for Heparin Indication: ACS/STEMI  No Known Allergies  Patient Measurements: Height: 5\' 2"  (157.5 cm) Weight: 224 lb (101.606 kg) IBW/kg (Calculated) : 50.1 Heparin Dosing Weight: 74.3 kg  Vital Signs: Temp: 97.5 F (36.4 C) (08/02 0754) Temp Source: Oral (08/02 0754) BP: 172/67 mmHg (08/02 0754) Pulse Rate: 58 (08/02 0754)  Labs:  Recent Labs  08/13/14 2024 08/13/14 2034 08/14/14 0049 08/14/14 1028  HGB  --  14.0  --   --   HCT  --  42.6  --   --   PLT  --  211  --   --   APTT 33  --   --   --   LABPROT 13.9  --   --   --   INR 1.05  --   --   --   HEPARINUNFRC  --   --   --  0.55  CREATININE  --  1.59*  --   --   TROPONINI  --  0.22* 5.32*  --     Estimated Creatinine Clearance: 33.1 mL/min (by C-G formula based on Cr of 1.59).   Medical History: Past Medical History  Diagnosis Date  . CHF (congestive heart failure)   . Hypertension   . Coronary artery disease   . Dementia   . hyperthyroid   . Hyperthyroidism     Medications:  Prescriptions prior to admission  Medication Sig Dispense Refill Last Dose  . aspirin EC 81 MG tablet Take 81 mg by mouth daily.   08/13/2014 at Unknown time  . carvedilol (COREG) 6.25 MG tablet Take 6.25 mg by mouth 2 (two) times daily.    08/13/2014 at 1600  . donepezil (ARICEPT) 5 MG tablet Take 5 mg by mouth at bedtime.   08/12/2014 at Unknown time  . lisinopril (PRINIVIL,ZESTRIL) 20 MG tablet Take 1 tablet (20 mg total) by mouth daily. 30 tablet 5 08/13/2014 at Unknown time  . methimazole (TAPAZOLE) 10 MG tablet Take 10 mg by mouth 2 (two) times daily.    08/13/2014 at Unknown time  . potassium chloride SA (K-DUR,KLOR-CON) 20 MEQ tablet Take 1 tablet (20 mEq total) by mouth every other day. (Patient taking differently: Take 10 mEq by mouth daily. ) 30 tablet 0 08/13/2014 at Unknown time  . torsemide (DEMADEX) 20 MG tablet Take 1 tablet (20 mg total) by mouth every other  day. (Patient taking differently: Take 10 mg by mouth daily. ) 30 tablet 0 08/13/2014 at Unknown time   Scheduled:  . [START ON 08/15/2014] aspirin  81 mg Oral Pre-Cath  . aspirin EC  81 mg Oral Daily  . carvedilol  6.25 mg Oral BID  . donepezil  5 mg Oral QHS  . lisinopril  20 mg Oral Daily  . methimazole  10 mg Oral BID  . sodium chloride  3 mL Intravenous Q12H  . sodium chloride  3 mL Intravenous Q12H   Infusions:  . sodium chloride    . [START ON 08/15/2014] sodium chloride     Followed by  . [START ON 08/15/2014] sodium chloride    . amiodarone 30 mg/hr (08/14/14 0617)  . heparin 900 Units/hr (08/14/14 0401)   PRN: sodium chloride, acetaminophen **OR** acetaminophen, morphine injection, nitroGLYCERIN, ondansetron **OR** ondansetron (ZOFRAN) IV, oxyCODONE, sodium chloride  Assessment: 80 y/o F admitted with known h/o CAD admitted with CP, STEMI.   Goal of Therapy:  Heparin level 0.3-0.7 units/ml Monitor platelets by anticoagulation  protocol: Yes   Plan:  Patient received [employer] heparin last pm so will give half bolus.  Patient HL is at goal. Will repeat in 8 hrs to confirm. Continue at a rate of 900 units/hr  Alexzandrea Normington D Farid Grigorian 08/14/2014,11:15 AM

## 2014-08-14 NOTE — Progress Notes (Signed)
Given the increased frequency of ventricular tachycardia will initiate amiodarone drip Noted elevation of troponin, initiate heparin drip

## 2014-08-14 NOTE — Progress Notes (Signed)
Patient's has no skin problems, skin is WNL. Verified by Amador Cunas, RN

## 2014-08-14 NOTE — Progress Notes (Signed)
Offered to have pt to watch cardiac cath video, pt denies need to view video stating " i have had a heart cath before"

## 2014-08-14 NOTE — Progress Notes (Signed)
Initial Nutrition Assessment       INTERVENTION:   Meals and snacks: Cater to pt preferences once diet progressed   NUTRITION DIAGNOSIS:   Inadequate oral intake related to acute illness as evidenced by NPO status.    GOAL:   Patient will meet greater than or equal to 90% of their needs    MONITOR:    (Energy intake, Electrolyte and renal profile, anthropometric)  REASON FOR ASSESSMENT:   Malnutrition Screening Tool    ASSESSMENT:      Pt admitted with non-st MI. Out of room at this time, for cardiac cath  Past Medical History  Diagnosis Date  . CHF (congestive heart failure)   . Hypertension   . Coronary artery disease   . Dementia   . hyperthyroid   . Hyperthyroidism     Current Nutrition: NPO  Food/Nutrition-Related History: NPO   Medications: NS at 158ml/hr  Electrolyte/Renal Profile and Glucose Profile:   Recent Labs Lab 08/13/14 2034 08/14/14 0049  NA 138  --   K 4.2  --   CL 104  --   CO2 25  --   BUN 16  --   CREATININE 1.59*  --   CALCIUM 8.9  --   MG  --  1.9  GLUCOSE 147*  --    Protein Profile:  Recent Labs Lab 08/13/14 2034  ALBUMIN 3.7     Last BM:8/02   Nutrition-Focused Physical Exam Findings:  Unable to complete Nutrition-Focused physical exam at this time.     Weight Change: Dtr at bedside reports weight loss prior to admission. Wt encounters reviewed since 03/2014 and shows steady weight gain    Diet Order:  Diet NPO time specified  Skin:   reviewed   Height:   Ht Readings from Last 1 Encounters:  08/13/14  (1.575 m)    Weight:   Wt Readings from Last 1 Encounters:  08/13/14 224 lb (101.606 kg)     BMI:  Body mass index is 40.96 kg/(m^2).   EDUCATION NEEDS:   No education needs identified at this time  LOW Care Level  Melanie Lewis, RD, LDN [phone] (pager)

## 2014-08-15 LAB — T3, FREE: T3, Free: 1.5 pg/mL — ABNORMAL LOW (ref 2.0–4.4)

## 2014-08-15 MED ORDER — PNEUMOCOCCAL VAC POLYVALENT 25 MCG/0.5ML IJ INJ: 0.5000 mL | INJECTION | INTRAMUSCULAR | Status: DC

## 2014-08-15 MED ORDER — AMIODARONE HCL 200 MG PO TABS: 200.0000 mg | ORAL_TABLET | Freq: Every day | ORAL | Status: DC

## 2014-08-15 NOTE — Discharge Instructions (Signed)
°  DIET:  °Cardiac diet ° °DISCHARGE CONDITION:  °Stable ° °ACTIVITY:  °Activity as tolerated ° °OXYGEN:  °Home Oxygen: No. °  °Oxygen Delivery: room air ° °DISCHARGE LOCATION:  °nursing home  ° ° °ADDITIONAL DISCHARGE INSTRUCTION: ° ° °If you experience worsening of your admission symptoms, develop shortness of breath, life threatening emergency, suicidal or homicidal thoughts you must seek medical attention immediately by calling 911 or calling your MD immediately  if symptoms less severe. ° °You Must read complete instructions/literature along with all the possible adverse reactions/side effects for all the Medicines you take and that have been prescribed to you. Take any new Medicines after you have completely understood and accpet all the possible adverse reactions/side effects.  ° °Please note ° °You were cared for by a hospitalist during your hospital stay. If you have any questions about your discharge medications or the care you received while you were in the hospital after you are discharged, you can call the unit and asked to speak with the hospitalist on call if the hospitalist that took care of you is not available. Once you are discharged, your primary care physician will handle any further medical issues. Please note that NO REFILLS for any discharge medications will be authorized once you are discharged, as it is imperative that you return to your primary care physician (or establish a relationship with a primary care physician if you do not have one) for your aftercare needs so that they can reassess your need for medications and monitor your lab values. ° ° °

## 2014-08-15 NOTE — Progress Notes (Signed)
Discharge instructions given. IV and tele removed. Education given on amiodarone, SVT, and MI. Patient has no further questions. Amiodarone prescription sent to pharmacy. Will follow up with PCP and cardiology.

## 2014-08-15 NOTE — Progress Notes (Addendum)
Held amiodarone dose this AM due to heart rate of 54. Dr. Allena Katz notified. Got in touch with Dr. Cassie Freer office and nurse stated that MD recommends patient to be sent home on the amiodarone due to episodes of SVT and patient will need to follow up outpatient with Dr. Juliann Pares to discuss a possible ablation. Will make sure patient has an appointment. Instructed patient to check her heart rate at home and gave her some options to buy a pulse ox.

## 2014-08-15 NOTE — Discharge Summary (Signed)
Melanie Lewis, 80 y.o., DOB 1934/04/01, MRN [id number]. Admission date: 08/13/2014 Discharge Date 08/15/2014 Primary MD Derwood Kaplan, MD Admitting Physician Wyatt Haste, MD  Admission Diagnosis  SVT (supraventricular tachycardia) [I47.1] Ischemic chest pain [I20.9]  Discharge Diagnosis   Active Problems:   Chest pain related to SVT   Elevated troponin suspected due to demand ischemia, status post cardiac catheter with minimal coronary artery disease  History of chronic congestive heart failure normal EF Dementia Hyperthyroidism with elevated TSH and suppressed free T3 and free T4 needs outpatient follow-up with endocrinology for adjustment of her medications        Hospital Course   Patient is a 80 year old female with history of palpitations in the past that to be due to SVT presented to the emergency room with complaint of having chest pain. Patient was noted to have SVT. The admitting physician thought that maybe patient had a V. tach. She was started on amiodarone drip her troponin increased as high as 5 therefore started on a heparin drip. She was seen by cardiology and underwent a cardiac catheterization which only showed 20% LAD lesion. No other abnormality was noted. She is feeling much better and is asymptomatic. Dr. Bertram Millard recommended patient being discharged on amiodarone orally. She'll follow-up with Dr. Vennie Homans for possible ablation.           Consults  cardiology  Significant Tests:  See full reports for all details    Dg Chest 2 View  08/13/2014   CLINICAL DATA:  Substernal chest pain since 4 p.m. today, non radiating  EXAM: CHEST  2 VIEW  COMPARISON:  06/23/2014  FINDINGS: Stable mild cardiac enlargement. Vascular pattern normal. Lungs clear. No effusion.  IMPRESSION: No active cardiopulmonary disease.   Electronically Signed   By: Esperanza Heir M.D.   On: 08/13/2014 20:45       Today   Subjective:   Melanie Lewis feels better denies any  chest pain or shortness of breath  Objective:   Blood pressure 165/61, pulse 50, temperature 98.1 F (36.7 C), temperature source Oral, resp. rate 17, height  (1.575 m), weight 101.606 kg (224 lb), SpO2 99 %.  .  Intake/Output Summary (Last 24 hours) at 08/15/14 1244 Last data filed at 08/15/14 1017  Gross per 24 hour  Intake    343 ml  Output   2025 ml  Net  -1682 ml    Exam VITAL SIGNS: Blood pressure 165/61, pulse 50, temperature 98.1 F (36.7 C), temperature source Oral, resp. rate 17, height  (1.575 m), weight 101.606 kg (224 lb), SpO2 99 %.  GENERAL:  80 y.o.-year-old patient lying in the bed with no acute distress.  EYES: Pupils equal, round, reactive to light and accommodation. No scleral icterus. Extraocular muscles intact.  HEENT: Head atraumatic, normocephalic. Oropharynx and nasopharynx clear.  NECK:  Supple, no jugular venous distention. No thyroid enlargement, no tenderness.  LUNGS: Normal breath sounds bilaterally, no wheezing, rales,rhonchi or crepitation. No use of accessory muscles of respiration.  CARDIOVASCULAR: S1, S2 normal. No murmurs, rubs, or gallops.  ABDOMEN: Soft, nontender, nondistended. Bowel sounds present. No organomegaly or mass.  EXTREMITIES: No pedal edema, cyanosis, or clubbing.  NEUROLOGIC: Cranial nerves II through XII are intact. Muscle strength 5/5 in all extremities. Sensation intact. Gait not checked.  PSYCHIATRIC: The patient is alert and oriented x 3.  SKIN: No obvious rash, lesion, or ulcer.   Data Review     CBC w Diff: Lab Results  Component Value Date   WBC 7.5 08/13/2014   WBC 9.5 02/11/2014   HGB 14.0 08/13/2014   HGB 12.6 02/11/2014   HCT 42.6 08/13/2014   HCT 37.8 02/11/2014   PLT 211 08/13/2014   PLT 155 02/11/2014   LYMPHOPCT 30 08/13/2014   LYMPHOPCT 10.5 02/11/2014   MONOPCT 7 08/13/2014   MONOPCT 7.6 02/11/2014   EOSPCT 1 08/13/2014   EOSPCT 1.2 02/11/2014   BASOPCT 1 08/13/2014   BASOPCT 1.9  02/11/2014   CMP: Lab Results  Component Value Date   NA 138 08/13/2014   NA 139 02/11/2014   K 4.2 08/13/2014   K 4.3 02/11/2014   CL 104 08/13/2014   CL 101 02/11/2014   CO2 25 08/13/2014   CO2 31 02/11/2014   BUN 16 08/13/2014   BUN 16 02/11/2014   CREATININE 1.59* 08/13/2014   CREATININE 0.88 02/11/2014   PROT 7.2 08/13/2014   PROT 7.2 02/11/2014   ALBUMIN 3.7 08/13/2014   ALBUMIN 2.9* 02/11/2014   BILITOT 0.4 08/13/2014   BILITOT 0.8 02/11/2014   ALKPHOS 106 08/13/2014   ALKPHOS 105 02/11/2014   AST 25 08/13/2014   AST 26 02/11/2014   ALT 15 08/13/2014   ALT 23 02/11/2014  .  Micro Results No results found for this or any previous visit (from the past 240 hour(s)).      Code Status Orders        Start     Ordered   08/14/14 1415  Full code   Continuous     08/14/14 1414          Follow-up Information    Follow up with Derwood Kaplan, MD. Go on 08/23/2014.   Specialty:  Internal Medicine   Why:  at 2:15pm.    Contact information:   [address] [place] [address] [phone]       Follow up with Carlena Sax, MD On 08/28/2014.   Specialty:  Internal Medicine   Why:  at 1:45pm. for abnormal tsh, free t4, free t3   Contact information:   [address] [place] [address] [phone]       Follow up with Alwyn Pea., MD. Go on 08/22/2014.   Specialties:  Cardiology, Internal Medicine   Why:  at 3:15pm.    Contact information:   [address] [place] [address] [phone]       Discharge Medications     Medication List    TAKE these medications        amiodarone 200 MG tablet  Commonly known as:  PACERONE  Take 1 tablet (200 mg total) by mouth daily.     aspirin EC 81 MG tablet  Take 81 mg by mouth daily.     carvedilol 6.25 MG tablet  Commonly known as:  COREG  Take 6.25 mg by mouth 2 (two) times daily.     donepezil 5 MG tablet  Commonly known as:  ARICEPT  Take 5 mg by mouth at  bedtime.     lisinopril 20 MG tablet  Commonly known as:  PRINIVIL,ZESTRIL  Take 1 tablet (20 mg total) by mouth daily.     methimazole 10 MG tablet  Commonly known as:  TAPAZOLE  Take 10 mg by mouth 2 (two) times daily.     potassium chloride SA 20 MEQ tablet  Commonly known as:  K-DUR,KLOR-CON  Take 1 tablet (20 mEq total) by mouth every other day.     torsemide  20 MG tablet  Commonly known as:  DEMADEX  Take 1 tablet (20 mg total) by mouth every other day.           Total Time in preparing paper work, data evaluation and todays exam - 35 minutes  Auburn Bilberry M.D on 08/15/2014 at 12:44 PM  [facility]   Office  [phone]

## 2014-09-16 NOTE — Progress Notes (Signed)
ELECTROPHYSIOLOGY CONSULT NOTE  Patient ID: Melanie Lewis, MRN: [id number], DOB/AGE: 1934/03/18 80 y.o. Admit date: (Not on file) Date of Consult: 09/18/2014  Primary Physician: Derwood Kaplan, MD Primary Cardiologist: DC  Chief Complaint: SVT   HPI Melanie Lewis is a 80 y.o. female  Referred for recurrent SVT assoc with demand ischemia and + Tn  Has Hx of atrial fibrillation with rapid VR (confirmed by me) noted 1/16;  EF at that time  25%   Readmitted 6/16 for SVT for which she was given adenosine 6+12 with termination ( apparently by EMS) thus  no strips are available.   Normal LV function with mod LVH and diastolic dysfunction ; previous cardiomyopathy appears thus to be rate related Admitted 8/16 with CP and palpitations; SVT documented with retrograde p wave in prox ST segment at aboput 120-40 msec   Tn 5.3  Cath 1/16 had been unrevealing but because of Tn and VT-NS >>Cath 8/16 [facility] normal LV function and no obtructive CAD   She was noted initially to have atrial fibrillation in the context of hyperthyroidism which is now euthyroid.  She will been treated with methimazole which is been intercurrently discontinued.  She continues to struggle with shortness of breath and edema  Past Medical History  Diagnosis Date  . CHF (congestive heart failure)   . Hypertension   . Coronary artery disease   . Dementia   . hyperthyroid   . Hyperthyroidism   . MI (myocardial infarction)       Surgical History:  Past Surgical History  Procedure Laterality Date  . Partial hysterectomy    . Cholecystectomy    . Abdominal hysterectomy    . Cardiac catheterization    . Cardiac catheterization N/A 08/14/2014    Procedure: Left Heart Cath and Coronary Angiography;  Surgeon: Marcina Millard, MD;  Location: [facility] INVASIVE CV LAB;  Service: Cardiovascular;  Laterality: N/A;     Home Meds: Prior to Admission medications   Medication Sig Start Date End Date Taking? Authorizing  Provider  amiodarone (PACERONE) 200 MG tablet Take 1 tablet (200 mg total) by mouth daily. 08/15/14   Auburn Bilberry, MD  aspirin EC 81 MG tablet Take 81 mg by mouth daily.    Historical Provider, MD  carvedilol (COREG) 6.25 MG tablet Take 6.25 mg by mouth 2 (two) times daily.     Historical Provider, MD  donepezil (ARICEPT) 5 MG tablet Take 5 mg by mouth at bedtime.    Historical Provider, MD  lisinopril (PRINIVIL,ZESTRIL) 20 MG tablet Take 1 tablet (20 mg total) by mouth daily. 06/29/14   Delma Freeze, FNP  methimazole (TAPAZOLE) 10 MG tablet Take 10 mg by mouth 2 (two) times daily.     Historical Provider, MD  potassium chloride SA (K-DUR,KLOR-CON) 20 MEQ tablet Take 1 tablet (20 mEq total) by mouth every other day. Patient taking differently: Take 10 mEq by mouth daily.  06/24/14   Enedina Finner, MD  torsemide (DEMADEX) 20 MG tablet Take 1 tablet (20 mg total) by mouth every other day. Patient taking differently: Take 10 mg by mouth daily.  06/24/14   Enedina Finner, MD    Inp   Allergies: No Known Allergies  Social History   Social History  . Marital Status: Widowed    Spouse Name: N/A  . Number of Children: N/A  . Years of Education: N/A   Occupational History  . Not on file.   Social History Main Topics  .  Smoking status: Never Smoker   . Smokeless tobacco: Never Used  . Alcohol Use: 1.2 oz/week    2 Shots of liquor, 0 Standard drinks or equivalent per week  . Drug Use: No  . Sexual Activity: Not on file   Other Topics Concern  . Not on file   Social History Narrative     Family History  Problem Relation Age of Onset  . Heart attack Sister   . Hypertension Sister     ROS:  Please see the history of present illness.     All other systems reviewed and negative.    Physical Exam:   Blood pressure 160/92, pulse 56, height 5\' 2"  (1.575 m), weight 199 lb (90.266 kg). General: Well developed, well nourished female in no acute distress. Head: Normocephalic, atraumatic,  sclera non-icteric, no xanthomas, nares are without discharge. EENT: normal Lymph Nodes:  none Back: without scoliosis/kyphosis, no CVA tendersness Neck: Negative for carotid bruits. JVD 8-10 Lungs: Clear bilaterally to auscultation without wheezes, rales, or rhonchi. Breathing is unlabored. Heart: RRR with S1 S2.  2/6 systolic  murmur , rubs, or gallops appreciated. Abdomen: Soft, non-tender, non-distended with normoactive bowel sounds. No hepatomegaly. No rebound/guarding. No obvious abdominal masses. Msk:  Strength and tone appear normal for age. Extremities: No clubbing or cyanosis.  1+  edema.  Distal pedal pulses are 2+ and equal bilaterally. Skin: Warm and Dry Neuro: Alert and oriented X 3. CN III-XII intact Grossly normal sensory and motor function . Psych:  Responds to questions appropriately with a normal affect.      Labs: Cardiac Enzymes No results for input(s): CKTOTAL, CKMB, TROPONINI in the last 72 hours. CBC Lab Results  Component Value Date   WBC 7.5 08/13/2014   HGB 14.0 08/13/2014   HCT 42.6 08/13/2014   MCV 92.8 08/13/2014   PLT 211 08/13/2014   PROTIME: No results for input(s): LABPROT, INR in the last 72 hours. Chemistry No results for input(s): NA, K, CL, CO2, BUN, CREATININE, CALCIUM, PROT, BILITOT, ALKPHOS, ALT, AST, GLUCOSE in the last 168 hours.  Invalid input(s): LABALBU Lipids Lab Results  Component Value Date   CHOL 155 02/08/2014   HDL 52 02/08/2014   LDLCALC 89 02/08/2014   TRIG 69 02/08/2014   BNP No results found for: PROBNP Thyroid Function Tests: No results for input(s): TSH, T4TOTAL, T3FREE, THYROIDAB in the last 72 hours.  Invalid input(s): FREET3    Miscellaneous No results found for: DDIMER  Radiology/Studies:  No results found.  EKG: sinus rhythm at 56 Intervals  23/10/45   Assessment and Plan: Atrial fibrillation with a rapid rate  Supraventricular tachycardia that wasn't sensitive  Moderate left ventricular  hypertrophy without coronary disease  HFpEF  Hyperthyroidism-now euthyroid  Sinus bradycardia  Hypertension  Renal insufficiency grade 3     The patient has recurrent arrhythmia. She has manifested both SVT as well as atrial fibrillation. The latter was temporally associated with identified hyperthyroidism. 2 strategies present themselves. The first is catheter ablation the second is anti-arrhythmic therapy.  Given her age and comorbidities I am inclined towards the latter.  I will be in touch with her endocrinologist to decide whether we can resume amiodarone. We will begin her on 200 mg twice daily for 4 weeks and then drop her down to 200 mg a day. She takes high-dose atenolol which we can discontinue  So to avoid bradycardia from the amiodarone.  We will have her take 50 mg a day for 10 days  and then 50 mg every other day for 10 days.  She does need augmented hypertensive therapy. Kidney issues suggest thathydralazine might be reasonable antihypertensives. We will check her metabolic profile today.    I discussed the case with her endocrinologist. We will proceed as above.   Sherryl Manges

## 2014-09-18 ENCOUNTER — Ambulatory Visit (INDEPENDENT_AMBULATORY_CARE_PROVIDER_SITE_OTHER): Payer: Medicare Other | Admitting: Internal Medicine

## 2014-09-18 ENCOUNTER — Encounter (INDEPENDENT_AMBULATORY_CARE_PROVIDER_SITE_OTHER): Payer: Self-pay

## 2014-09-18 ENCOUNTER — Encounter: Payer: Self-pay | Admitting: Internal Medicine

## 2014-09-18 VITALS — BP 160/92 | HR 56 | Ht 62.0 in | Wt 199.0 lb

## 2014-09-18 DIAGNOSIS — I471 Supraventricular tachycardia: Secondary | ICD-10-CM | POA: Diagnosis not present

## 2014-09-18 MED ORDER — AMIODARONE HCL 200 MG PO TABS: 200.0000 mg | ORAL_TABLET | Freq: Every day | ORAL | Status: DC

## 2014-09-18 MED ORDER — HYDRALAZINE HCL 25 MG PO TABS: 25.0000 mg | ORAL_TABLET | Freq: Two times a day (BID) | ORAL | Status: DC

## 2014-09-18 NOTE — Patient Instructions (Addendum)
Medication Instructions:  Your physician has recommended you make the following change in your medication:  START taking hydralazine  twice per day REDUCE atenolol to  every other day for 10 days then stop taking  START taking amiodarone  twice per day for 4 weeks then take amiodarone  once per day   Labwork: Your physician recommends that you have labs today: BMET   Testing/Procedures: none  Follow-Up: Your physician recommends that you schedule a follow-up appointment with Dr. Juliann Pares. Your physician recommends that you schedule a follow-up appointment with Dr. Graciela Husbands as needed. Your physician recommends that you schedule a follow-up appointment with Dr. Tedd Sias in October     Any Other Special Instructions Will Be Listed Below (If Applicable).

## 2014-09-19 LAB — BASIC METABOLIC PANEL
BUN/Creatinine Ratio: 11 (ref 11–26)
BUN: 11 mg/dL (ref 8–27)
CO2: 23 mmol/L (ref 18–29)
Calcium: 9.4 mg/dL (ref 8.7–10.3)
Chloride: 102 mmol/L (ref 97–108)
Creatinine, Ser: 1.04 mg/dL — ABNORMAL HIGH (ref 0.57–1.00)
GFR calc Af Amer: 60 mL/min/{1.73_m2} (ref >59)
GFR calc non Af Amer: 52 mL/min/{1.73_m2} — ABNORMAL LOW (ref >59)
Glucose: 88 mg/dL (ref 65–99)
Potassium: 4.5 mmol/L (ref 3.5–5.2)
Sodium: 142 mmol/L (ref 134–144)

## 2014-09-25 ENCOUNTER — Encounter: Payer: Self-pay | Admitting: Family

## 2014-09-25 ENCOUNTER — Ambulatory Visit: Payer: Medicare Other | Attending: Family | Admitting: Family

## 2014-09-25 VITALS — BP 159/57 | HR 59 | Resp 20 | Ht 61.0 in | Wt 201.0 lb

## 2014-09-25 DIAGNOSIS — I2510 Atherosclerotic heart disease of native coronary artery without angina pectoris: Secondary | ICD-10-CM | POA: Diagnosis not present

## 2014-09-25 DIAGNOSIS — R0602 Shortness of breath: Secondary | ICD-10-CM | POA: Diagnosis not present

## 2014-09-25 DIAGNOSIS — E0590 Thyrotoxicosis, unspecified without thyrotoxic crisis or storm: Secondary | ICD-10-CM | POA: Insufficient documentation

## 2014-09-25 DIAGNOSIS — F0390 Unspecified dementia without behavioral disturbance: Secondary | ICD-10-CM | POA: Diagnosis not present

## 2014-09-25 DIAGNOSIS — R001 Bradycardia, unspecified: Secondary | ICD-10-CM | POA: Insufficient documentation

## 2014-09-25 DIAGNOSIS — I252 Old myocardial infarction: Secondary | ICD-10-CM | POA: Insufficient documentation

## 2014-09-25 DIAGNOSIS — I471 Supraventricular tachycardia: Secondary | ICD-10-CM | POA: Insufficient documentation

## 2014-09-25 DIAGNOSIS — I10 Essential (primary) hypertension: Secondary | ICD-10-CM | POA: Diagnosis not present

## 2014-09-25 DIAGNOSIS — Z79899 Other long term (current) drug therapy: Secondary | ICD-10-CM | POA: Insufficient documentation

## 2014-09-25 DIAGNOSIS — I5032 Chronic diastolic (congestive) heart failure: Secondary | ICD-10-CM | POA: Diagnosis not present

## 2014-09-25 DIAGNOSIS — Z7982 Long term (current) use of aspirin: Secondary | ICD-10-CM | POA: Diagnosis not present

## 2014-09-25 MED ORDER — CARVEDILOL 6.25 MG PO TABS: 6.2500 mg | ORAL_TABLET | Freq: Two times a day (BID) | ORAL | Status: DC

## 2014-09-25 NOTE — Progress Notes (Signed)
Subjective:    Patient ID: Melanie Lewis, female    DOB: 02/15/1934, 80 y.o.   MRN: [id number]  Congestive Heart Failure Presents for follow-up visit. The disease course has been stable. Associated symptoms include chest pressure, edema, fatigue, palpitations and shortness of breath (intermittently). Pertinent negatives include no abdominal pain, chest pain (intermittently) or orthopnea. The symptoms have been stable. The pain is at a severity of 2/10. The pain is mild. The pain is present in the lateral region. The quality of the pain is described as pressure. The pain does not radiate. Chest pain occurs with exertion. Past treatments include ACE inhibitors, beta blockers and salt and fluid restriction. The treatment provided moderate relief. Compliance with prior treatments has been good. Her past medical history is significant for CAD and HTN. There is no history of CVA or DM. She has one 1st degree relative with heart disease. Compliance with total regimen is 76-100%.  Chest Pain  This is a recurrent problem. The current episode started more than 1 year ago. The onset quality is gradual. The problem occurs intermittently. The problem has been unchanged. The pain is present in the lateral region. The pain is at a severity of 2/10. The pain is mild. The quality of the pain is described as pressure. The pain does not radiate. Associated symptoms include a cough (on occasion), palpitations and shortness of breath (intermittently). Pertinent negatives include no abdominal pain, back pain, dizziness, headaches or nausea. The pain is aggravated by exertion. She has tried rest for the symptoms. The treatment provided significant relief. Risk factors include being elderly.  Her past medical history is significant for CAD, CHF, HTN, hypertension and MI.  Pertinent negatives for past medical history include no DM.  Her family medical history is significant for heart disease and hypertension. Prior  diagnostic workup includes cardiac catherization, echocardiogram and chest x-ray.    Past Medical History  Diagnosis Date  . CHF (congestive heart failure)   . Hypertension   . Coronary artery disease   . Dementia   . hyperthyroid   . Hyperthyroidism   . MI (myocardial infarction)     Past Surgical History  Procedure Laterality Date  . Partial hysterectomy    . Cholecystectomy    . Abdominal hysterectomy    . Cardiac catheterization    . Cardiac catheterization N/A 08/14/2014    Procedure: Left Heart Cath and Coronary Angiography;  Surgeon: Marcina Millard, MD;  Location: [facility] INVASIVE CV LAB;  Service: Cardiovascular;  Laterality: N/A;    Family History  Problem Relation Age of Onset  . Heart attack Sister   . Hypertension Sister    Social History  Substance Use Topics  . Smoking status: Never Smoker   . Smokeless tobacco: Never Used  . Alcohol Use: 1.2 oz/week    2 Shots of liquor, 0 Standard drinks or equivalent per week    No Known Allergies  Prior to Admission medications   Medication Sig Start Date End Date Taking? Authorizing Provider  amiodarone (PACERONE) 200 MG tablet Take 1 tablet (200 mg total) by mouth daily. Take  twice per day for 4 weeks then  once per day 09/18/14  Yes Duke Salvia, MD  aspirin EC 81 MG tablet Take 81 mg by mouth daily.   Yes Historical Provider, MD  atenolol (TENORMIN) 100 MG tablet Take 50 mg by mouth daily. Take 1/2 tablet every other day for 10 days then stop. 09/18/14  Yes Historical Provider, MD  carvedilol (COREG) 6.25 MG tablet Take 1 tablet (6.25 mg total) by mouth 2 (two) times daily. 09/25/14  Yes Delma Freeze, FNP  hydrALAZINE (APRESOLINE) 25 MG tablet Take 1 tablet (25 mg total) by mouth 2 (two) times daily. 09/18/14  Yes Duke Salvia, MD  lisinopril (PRINIVIL,ZESTRIL) 20 MG tablet Take 1 tablet (20 mg total) by mouth daily. 06/29/14  Yes Delma Freeze, FNP  omeprazole (PRILOSEC) 40 MG capsule Take 40 mg by mouth  daily.   Yes Historical Provider, MD  potassium chloride SA (K-DUR,KLOR-CON) 20 MEQ tablet Take 1 tablet (20 mEq total) by mouth every other day. Patient taking differently: Take 10 mEq by mouth every other day.  06/24/14  Yes Enedina Finner, MD  torsemide (DEMADEX) 20 MG tablet Take 1 tablet (20 mg total) by mouth every other day. Patient taking differently: Take 10 mg by mouth every other day.  06/24/14  Yes Enedina Finner, MD    Review of Systems  Constitutional: Positive for fatigue. Negative for appetite change.  HENT: Positive for congestion and postnasal drip. Negative for ear pain and sore throat.   Eyes: Negative.   Respiratory: Positive for cough (on occasion) and shortness of breath (intermittently). Negative for chest tightness and wheezing.   Cardiovascular: Positive for palpitations and leg swelling. Negative for chest pain (intermittently).  Gastrointestinal: Negative for nausea, abdominal pain and abdominal distention.  Endocrine: Negative.   Genitourinary: Negative.   Musculoskeletal: Negative for back pain and neck pain.  Skin: Negative.   Allergic/Immunologic: Negative.   Neurological: Negative for dizziness, light-headedness and headaches.  Hematological: Negative for adenopathy. Does not bruise/bleed easily.  Psychiatric/Behavioral: Negative for sleep disturbance (sleeping on 2 pillows) and dysphoric mood. The patient is not nervous/anxious.        Objective:   Physical Exam  Constitutional: She is oriented to person, place, and time. She appears well-developed and well-nourished.  HENT:  Head: Normocephalic and atraumatic.  Eyes: Conjunctivae are normal. Pupils are equal, round, and reactive to light.  Neck: Normal range of motion.  Cardiovascular: Regular rhythm.  Bradycardia present.   Pulmonary/Chest: Effort normal. She has no wheezes. She has no rales.  Abdominal: Soft. She exhibits no distension. There is no tenderness.  Musculoskeletal: She exhibits edema (trace  amount around bilateral ankles). She exhibits no tenderness.  Neurological: She is alert and oriented to person, place, and time.  Skin: Skin is warm and dry.  Psychiatric: She has a normal mood and affect. Her behavior is normal. Thought content normal.  Nursing note and vitals reviewed.   BP 159/57 mmHg  Pulse 59  Resp 20  Ht 5\' 1"  (1.549 m)  Wt 201 lb (91.173 kg)  BMI 38.00 kg/m2  SpO2 100%  LMP  (LMP Unknown)       Assessment & Plan:  1: Chronic heart failure with preserved ejection fraction- Patient presents with some intermittent shortness of breath upon exertion as well as fatigue upon exertion. She says that she didn't notice any symptoms upon walking into the office today. When she does experience symptoms, she will stop what she's doing to sit down until her symptoms resolve. She continues to weigh herself and reports a stable weight. Reminded to call for an overnight weight gain of >2 pounds or a weekly weight gain of >5 pounds. She is not adding any salt to her food and is trying to follow a low sodium diet. Goes out to eat on a weekly basis but tries to make low  sodium choices. She does have some edema around her ankles and she says that it's completely gone when she wakes up in the mornings. Encouraged her to elevate her legs when she's sitting down at home.  2: HTN- Blood pressure is slightly elevated. She's currently having some medications adjusted. Did refill her carvedilol today. 3: Paroxysmal SVT- Patient has had amiodarone started and is weaning off of atenolol and thinks her last dose of atenolol will be 10/01/14. Does occasionally have chest pain but says that it's infrequent in nature and resolves quickly upon rest. 4: Bradycardia- Heart rate is lower than previously but she's been on 2 beta blockers and now amiodarone. Atenolol being stopped per above. Sees her PCP next week.  Return in 3 months or sooner for any questions/problems before then.

## 2014-09-25 NOTE — Patient Instructions (Addendum)
Continue weighing daily and call for an overnight weight gain of > 2 pounds or a weekly weight gain of >5 pounds. 

## 2014-12-13 ENCOUNTER — Other Ambulatory Visit: Payer: Self-pay | Admitting: Family

## 2014-12-26 ENCOUNTER — Telehealth: Payer: Self-pay | Admitting: Family

## 2014-12-26 ENCOUNTER — Ambulatory Visit: Payer: Medicare Other | Admitting: Family

## 2014-12-26 NOTE — Telephone Encounter (Signed)
Patient did not show for her appointment today (12/26/14) at [facility]. Will attempt to reschedule.

## 2015-04-15 ENCOUNTER — Other Ambulatory Visit: Payer: Self-pay | Admitting: Family

## 2015-05-21 ENCOUNTER — Other Ambulatory Visit: Payer: Self-pay | Admitting: Internal Medicine

## 2017-04-16 ENCOUNTER — Encounter: Payer: Self-pay | Admitting: Nurse Practitioner

## 2017-04-16 ENCOUNTER — Ambulatory Visit (INDEPENDENT_AMBULATORY_CARE_PROVIDER_SITE_OTHER): Payer: Medicare Other | Admitting: Nurse Practitioner

## 2017-04-16 VITALS — BP 188/83 | HR 57 | Temp 98.5°F | Ht 61.0 in | Wt 210.2 lb

## 2017-04-16 DIAGNOSIS — I25118 Atherosclerotic heart disease of native coronary artery with other forms of angina pectoris: Secondary | ICD-10-CM | POA: Diagnosis not present

## 2017-04-16 DIAGNOSIS — E0590 Thyrotoxicosis, unspecified without thyrotoxic crisis or storm: Secondary | ICD-10-CM

## 2017-04-16 DIAGNOSIS — R7989 Other specified abnormal findings of blood chemistry: Secondary | ICD-10-CM | POA: Diagnosis not present

## 2017-04-16 DIAGNOSIS — E7800 Pure hypercholesterolemia, unspecified: Secondary | ICD-10-CM | POA: Diagnosis not present

## 2017-04-16 DIAGNOSIS — I10 Essential (primary) hypertension: Secondary | ICD-10-CM

## 2017-04-16 DIAGNOSIS — Z7689 Persons encountering health services in other specified circumstances: Secondary | ICD-10-CM

## 2017-04-16 DIAGNOSIS — F0390 Unspecified dementia without behavioral disturbance: Secondary | ICD-10-CM | POA: Diagnosis not present

## 2017-04-16 DIAGNOSIS — M8588 Other specified disorders of bone density and structure, other site: Secondary | ICD-10-CM

## 2017-04-16 DIAGNOSIS — I5022 Chronic systolic (congestive) heart failure: Secondary | ICD-10-CM | POA: Diagnosis not present

## 2017-04-16 LAB — LIPID PANEL
Cholesterol: 277 mg/dL — ABNORMAL HIGH (ref <200)
HDL: 83 mg/dL (ref >50)
LDL Cholesterol (Calc): 174 mg/dL (calc) — ABNORMAL HIGH
Non-HDL Cholesterol (Calc): 194 mg/dL (calc) — ABNORMAL HIGH (ref <130)
Total CHOL/HDL Ratio: 3.3 (calc) (ref <5.0)
Triglycerides: 89 mg/dL (ref <150)

## 2017-04-16 LAB — COMPLETE METABOLIC PANEL WITH GFR
AG Ratio: 1.4 (calc) (ref 1.0–2.5)
ALT: 40 U/L — ABNORMAL HIGH (ref 6–29)
AST: 40 U/L — ABNORMAL HIGH (ref 10–35)
Albumin: 3.8 g/dL (ref 3.6–5.1)
Alkaline phosphatase (APISO): 74 U/L (ref 33–130)
BUN/Creatinine Ratio: 14 (calc) (ref 6–22)
BUN: 18 mg/dL (ref 7–25)
CO2: 27 mmol/L (ref 20–32)
Calcium: 9.5 mg/dL (ref 8.6–10.4)
Chloride: 106 mmol/L (ref 98–110)
Creat: 1.31 mg/dL — ABNORMAL HIGH (ref 0.60–0.88)
GFR, Est African American: 44 mL/min/{1.73_m2} — ABNORMAL LOW (ref >=60)
GFR, Est Non African American: 38 mL/min/{1.73_m2} — ABNORMAL LOW (ref >=60)
Globulin: 2.8 g/dL (calc) (ref 1.9–3.7)
Glucose, Bld: 89 mg/dL (ref 65–139)
Potassium: 4 mmol/L (ref 3.5–5.3)
Sodium: 141 mmol/L (ref 135–146)
Total Bilirubin: 0.6 mg/dL (ref 0.2–1.2)
Total Protein: 6.6 g/dL (ref 6.1–8.1)

## 2017-04-16 LAB — TSH: TSH: 0.02 mIU/L — ABNORMAL LOW (ref 0.40–4.50)

## 2017-04-16 MED ORDER — TORSEMIDE 20 MG PO TABS: ORAL_TABLET | ORAL | 0 refills | Status: DC

## 2017-04-16 MED ORDER — DONEPEZIL HCL 5 MG PO TABS: 5.0000 mg | ORAL_TABLET | Freq: Every day | ORAL | 5 refills | Status: DC

## 2017-04-16 MED ORDER — CARVEDILOL 6.25 MG PO TABS: 6.2500 mg | ORAL_TABLET | Freq: Two times a day (BID) | ORAL | 5 refills | Status: DC

## 2017-04-16 MED ORDER — HYDRALAZINE HCL 50 MG PO TABS: 50.0000 mg | ORAL_TABLET | Freq: Two times a day (BID) | ORAL | 5 refills | Status: DC

## 2017-04-16 MED ORDER — POTASSIUM CHLORIDE CRYS ER 20 MEQ PO TBCR: EXTENDED_RELEASE_TABLET | ORAL | 0 refills | Status: DC

## 2017-04-16 MED ORDER — LISINOPRIL 20 MG PO TABS: 20.0000 mg | ORAL_TABLET | Freq: Every day | ORAL | 5 refills | Status: DC

## 2017-04-16 NOTE — Patient Instructions (Addendum)
Melanie KitchensMargaret N Lewis,   Thank you for coming in to clinic today.  1. [facility] and following your osteopenia.  They will call you to schedule.  2. Continue all medications without changes.  Restart your lisinopril.  3. Work to reduce your salt intake.  Please schedule a follow-up appointment with Wilhelmina McardleLauren Leahna Hewson, AGNP. Return in about 3 months (around 07/16/2017) for hypertension.  If you have any other questions or concerns, please feel free to call the clinic or send a message through MyChart. You may also schedule an earlier appointment if necessary.  You will receive a survey after today's visit either digitally by e-mail or paper by [employer] mail. Your experiences and feedback matter to us.  Please respond so we know how we are doing as we provide care for you.   Wilhelmina McardleLauren Benoit Meech, DNP, AGNP-BC Adult Gerontology Nurse Practitioner [facility]

## 2017-04-16 NOTE — Progress Notes (Signed)
Subjective:    Patient ID: Melanie Lewis, female    DOB: Aug 10, 1934, 83 y.o.   MRN: [id number]  Melanie Lewis is a 83 y.o. female presenting on 04/16/2017 for Establish Care (need medication refill )   HPI Establish Care New Provider Pt last seen by PCP about 1 year ago with Dr. Maryellen Pile.  Obtain records.    Hypertension - She is not checking BP at home or outside of clinic.    - Current medications: Amiodarone 200 mg daily, carvedilol 6.25 mg twice daily, hydralazine 50 mg twice daily, lisinopril 20 mg daily, torsemide 20 mg every M/W/F/Sa/Su per pt report tolerating well without side effects - She is not currently symptomatic except for headache this morning and increased leg swelling.  She also notes need to increase pant/dress size. - Pt denies lightheadedness, dizziness, changes in vision, chest tightness/pressure, palpitations, sudden loss of speech or loss of consciousness. - She  reports no regular exercise routine, although she is active with friends for walking, shopping, visiting shut in older adults. - Her diet is Moderate to high in salt, moderate in fat, and moderate in carbohydrates. Is increasing salt intake over the last several months has gained weight to larger size, Diet recall: Oatmeal in am, banana sandwich, eats [employer] occasionally for dinner, chicken/fish/pork chop (baked) and vegetables.    Depression, worse sleep Increased depression initially when caring for her sister with dementia.  Sister "rambles around at night."  Patient continues to have good social support system with friends, but has had increased depression with now being caregiver for her sister who has dementia.  History of hyperthyroidism Patient has history of hyperthyroidism and is on amiodarone.  She has not continue to be followed by Dr. Rich Brave over the last 12-18 months.  Patient reports being told she did not have to return.  No recent recheck of TSH is available.  In 2016 when patient  was initially diagnosed with hyperthyroidism, she was recently started on amiodarone.  She was managed with methimazole and was never taken off amiodarone.  It was identified at that time she has osteopenia.  Patient has not had repeat DEXA scan. -Patient reports increased tremor since January. -Otherwise, patient is asymptomatic and has no current complaints.  She has not had any racing heartbeat.  Past Medical History:  Diagnosis Date  . CHF (congestive heart failure) (HCC)   . Coronary artery disease   . Dementia   . Heart attack (HCC)   . Hypertension   . hyperthyroid   . Hyperthyroidism   . MI (myocardial infarction) [facility])    Past Surgical History:  Procedure Laterality Date  . CARDIAC CATHETERIZATION N/A 08/14/2014   Procedure: Left Heart Cath and Coronary Angiography;  Surgeon: Marcina Millard, MD;  Location: [facility] INVASIVE CV LAB;  Service: Cardiovascular;  Laterality: N/A;  . CHOLECYSTECTOMY    . PARTIAL HYSTERECTOMY     Social History   Socioeconomic History  . Marital status: Widowed    Spouse name: Not on file  . Number of children: Not on file  . Years of education: Not on file  . Highest education level: Not on file  Occupational History  . Not on file  Social Needs  . Financial resource strain: Not on file  . Food insecurity:    Worry: Not on file    Inability: Not on file  . Transportation needs:    Medical: Not on file    Non-medical: Not on file  Tobacco Use  .  Smoking status: Never Smoker  . Smokeless tobacco: Never Used  Substance and Sexual Activity  . Alcohol use: Not Currently    Alcohol/week: 1.2 oz    Types: 2 Shots of liquor per week  . Drug use: No  . Sexual activity: Not Currently  Lifestyle  . Physical activity:    Days per week: Not on file    Minutes per session: Not on file  . Stress: Not on file  Relationships  . Social connections:    Talks on phone: Not on file    Gets together: Not on file    Attends religious service: Not  on file    Active member of club or organization: Not on file    Attends meetings of clubs or organizations: Not on file    Relationship status: Not on file  . Intimate partner violence:    Fear of current or ex partner: Not on file    Emotionally abused: Not on file    Physically abused: Not on file    Forced sexual activity: Not on file  Other Topics Concern  . Not on file  Social History Narrative  . Not on file   Family History  Problem Relation Age of Onset  . Heart attack Sister   . Hypertension Sister   . Dementia Sister   . Heart attack Son   . Dementia Sister    Current Outpatient Medications on File Prior to Visit  Medication Sig  . amiodarone (PACERONE) 200 MG tablet take 1 tablet by mouth twice a day for 4 weeks then 1 tablet once daily  . aspirin EC 81 MG tablet Take 81 mg by mouth daily.  . Biotin 1 MG CAPS Take by mouth.  . Calcium Carb-Cholecalciferol (CALCIUM 1000 + D PO) Calcium 600 mg + vit d 800 iu- 1 tab daily  . Cholecalciferol (VITAMIN D3) 1000 units CAPS Take by mouth.  Marland Kitchen omeprazole (PRILOSEC) 40 MG capsule Take 40 mg by mouth daily.   No current facility-administered medications on file prior to visit.     Review of Systems  Constitutional: Negative.   HENT: Negative.   Eyes: Negative.   Respiratory: Negative for cough, shortness of breath and wheezing.   Cardiovascular: Positive for leg swelling. Negative for chest pain and palpitations.  Gastrointestinal: Negative.   Endocrine: Negative.   Genitourinary: Positive for frequency (2/2 diuretic).  Musculoskeletal: Negative.   Skin: Negative.   Allergic/Immunologic: Negative.   Neurological: Positive for headaches (occasional).       Mild cognitive impairment - difficulty with dates and timelines  Hematological: Negative.   Psychiatric/Behavioral: Positive for dysphoric mood and sleep disturbance.   Per HPI unless specifically indicated above     Objective:    BP (!) 188/83   Pulse (!) 57    Temp 98.5 F (36.9 C) (Oral)   Ht 5\' 1"  (1.549 m)   Wt 210 lb 3.2 oz (95.3 kg)   LMP  (LMP Unknown)   BMI 39.72 kg/m   Wt Readings from Last 3 Encounters:  04/16/17 210 lb 3.2 oz (95.3 kg)  09/25/14 201 lb (91.2 kg)  09/18/14 199 lb (90.3 kg)    Physical Exam  Constitutional: She is oriented to person, place, and time. She appears well-developed and well-nourished. No distress.  HENT:  Head: Normocephalic and atraumatic.  Neck: Normal range of motion. Neck supple. Hepatojugular reflux present. No JVD present. Carotid bruit is present (Left).  Cardiovascular: Normal rate, regular rhythm, S1 normal,  S2 normal and intact distal pulses. Exam reveals gallop and S4.  No murmur heard. Pulmonary/Chest: Effort normal and breath sounds normal. No respiratory distress.  Musculoskeletal: She exhibits no edema (pedal).  Neurological: She is alert and oriented to person, place, and time. Disoriented: to time.  Cognitive deficit with decreased ability to remember historical events in relationship to today.  Commonly stated something happened 1 month ago even if it was 8 months-2 years prior by medical record.  Skin: Skin is warm, dry and intact.  Psychiatric: She has a normal mood and affect. Her speech is normal and behavior is normal. Judgment and thought content normal. Cognition and memory are impaired. She exhibits abnormal recent memory and abnormal remote memory (Pt cannot remember ages of siblings or calculate current age from her current age.  Can state they are 2 years older, 3 years younger for example).  Vitals reviewed.   Results for orders placed or performed in visit on 04/16/17  Lipid panel  Result Value Ref Range   Cholesterol 277 (H) <200 mg/dL   HDL 83 >16 mg/dL   Triglycerides 89 <109 mg/dL   LDL Cholesterol (Calc) 174 (H) mg/dL (calc)   Total CHOL/HDL Ratio 3.3 <5.0 (calc)   Non-HDL Cholesterol (Calc) 194 (H) <130 mg/dL (calc)  COMPLETE METABOLIC PANEL WITH GFR  Result  Value Ref Range   Glucose, Bld 89 65 - 139 mg/dL   BUN 18 7 - 25 mg/dL   Creat 6.04 (H) 5.40 - 0.88 mg/dL   GFR, Est Non African American 38 (L) > OR = 60 mL/min/1.38m2   GFR, Est African American 44 (L) > OR = 60 mL/min/1.67m2   BUN/Creatinine Ratio 14 6 - 22 (calc)   Sodium 141 135 - 146 mmol/L   Potassium 4.0 3.5 - 5.3 mmol/L   Chloride 106 98 - 110 mmol/L   CO2 27 20 - 32 mmol/L   Calcium 9.5 8.6 - 10.4 mg/dL   Total Protein 6.6 6.1 - 8.1 g/dL   Albumin 3.8 3.6 - 5.1 g/dL   Globulin 2.8 1.9 - 3.7 g/dL (calc)   AG Ratio 1.4 1.0 - 2.5 (calc)   Total Bilirubin 0.6 0.2 - 1.2 mg/dL   Alkaline phosphatase (APISO) 74 33 - 130 U/L   AST 40 (H) 10 - 35 U/L   ALT 40 (H) 6 - 29 U/L  TSH  Result Value Ref Range   TSH 0.02 (L) 0.40 - 4.50 mIU/L      Assessment & Plan:   Problem List Items Addressed This Visit      Cardiovascular and Mediastinum   Essential hypertension - Primary    Uncontrolled hypertension, but pt has been off lisinopril for last 3-4 weeks.  BP goal < 140/90.  Pt is not working on lifestyle modifications.  Taking medications tolerating well without side effects.  Complications: - CKD stage II - diastolic CHF  Plan: 1. Continue taking all medications as prescribed in past.   - Pt was taking torsemide qod.  Increase to M, W, F, Sat, Sun.  May need to take daily if hypertension and edema are not resolving.  2. Obtain labs CMP, TSH, Lipid today  3. Encouraged heart healthy diet and increasing exercise to 30 minutes most days of the week. 4. Check BP 1-2 x per week at home, keep log, and bring to clinic at next appointment. 5. Follow up 3 months.        Relevant Medications   lisinopril (PRINIVIL,ZESTRIL) 20 MG  tablet   torsemide (DEMADEX) 20 MG tablet   potassium chloride SA (K-DUR,KLOR-CON) 20 MEQ tablet   carvedilol (COREG) 6.25 MG tablet   hydrALAZINE (APRESOLINE) 50 MG tablet   atorvastatin (LIPITOR) 10 MG tablet   Other Relevant Orders   COMPLETE  METABOLIC PANEL WITH GFR   Coronary artery disease of native artery of native heart with stable angina pectoris (HCC)    Currently stable with rare angina.  Pt continues to be followed by Dr. Juliann Paresallwood.      Relevant Medications   lisinopril (PRINIVIL,ZESTRIL) 20 MG tablet   torsemide (DEMADEX) 20 MG tablet   carvedilol (COREG) 6.25 MG tablet   hydrALAZINE (APRESOLINE) 50 MG tablet   atorvastatin (LIPITOR) 10 MG tablet     Endocrine   Hyperthyroidism    Prior hyperthyroidism onset 2016.  Pt remains on amiodarone and has not had recent TSH check.  Has been lost to followup with Dr. Tedd SiasSolum.  Plan: 1. Repeat Dexa 2. Check TSH today with CMP and lipid 3. Followup as needed.  Consider return to endocrine.        Relevant Medications   carvedilol (COREG) 6.25 MG tablet   Other Relevant Orders   Ambulatory referral to Endocrinology     Nervous and Auditory   Dementia without behavioral disturbance    Pt with evidence of mild dementia and is on aricept.  Pt with family history of dementia.  Currently unidentified type of dementia, but suspect possible vascular dementia given known ASCVD and current lipid values.   Plan: 1. Continue aricept as pt is primary caregiver for elderly sister with advanced dementia. 2. Treat lipids. 3. Followup with serial MMSE beginning with next visit and every 3-6 months.      Relevant Medications   donepezil (ARICEPT) 5 MG tablet   Other Relevant Orders   Lipid panel (Completed)     Musculoskeletal and Integument   Osteopenia of lumbar spine    Identified in 2016 with Dr. Tedd SiasSolum.  No repeat check to assess progression.  Also with hyperthyroidism in past.  See AP above.  Plan: [facility]. [facility] for same machine. 2. Followup as needed after repeat scan.      Relevant Orders   DG Bone Density     Other   Pure hypercholesterolemia    Identifed on lab check of lipid panel.   In pt with known CAD, prior MI, and  compensated diastolic CHF would recommend statin therapy for prevention of future ASCVD events and reduction of risk of progressing dementia as it could be a vascular dementia.  START atorvastatin 10 mg once daily. Treat to LDL goal < 70.      Relevant Medications   lisinopril (PRINIVIL,ZESTRIL) 20 MG tablet   torsemide (DEMADEX) 20 MG tablet   carvedilol (COREG) 6.25 MG tablet   hydrALAZINE (APRESOLINE) 50 MG tablet   atorvastatin (LIPITOR) 10 MG tablet    Other Visit Diagnoses    Abnormal TSH       Relevant Orders   TSH   Encounter to establish care        Previous PCP was at Dr Maryellen PileEason.  Records will be requested.  Past medical, family, and surgical history reviewed w/ pt in clinic.  Specialist records reviewed personally from Dr. Juliann Paresallwood and Dr. Tedd SiasSolum for last 3 years.    Meds ordered this encounter  Medications  . lisinopril (PRINIVIL,ZESTRIL) 20 MG tablet    Sig: Take 1 tablet (20 mg  total) by mouth daily.    Dispense:  30 tablet    Refill:  5    Order Specific Question:   Supervising Provider    Answer:   Smitty Cords [[id number]]  . torsemide (DEMADEX) 20 MG tablet    Sig: Take 1 tablet (20 mg total) by mouth every Monday, Wednesday, Friday, Saturday, and Sunday.    Dispense:  30 tablet    Refill:  0    Order Specific Question:   Supervising Provider    Answer:   Smitty Cords [[id number]]  . potassium chloride SA (K-DUR,KLOR-CON) 20 MEQ tablet    Sig: Take 1 tablet (20 mg total) by mouth every Monday, Wednesday, Friday, Saturday, and Sunday    Dispense:  30 tablet    Refill:  0    Order Specific Question:   Supervising Provider    Answer:   Smitty Cords [[id number]]  . carvedilol (COREG) 6.25 MG tablet    Sig: Take 1 tablet (6.25 mg total) by mouth 2 (two) times daily.    Dispense:  60 tablet    Refill:  5    Order Specific Question:   Supervising Provider    Answer:   Smitty Cords [[id number]]  . donepezil (ARICEPT) 5 MG tablet    Sig:  Take 1 tablet (5 mg total) by mouth at bedtime.    Dispense:  30 tablet    Refill:  5    Order Specific Question:   Supervising Provider    Answer:   Smitty Cords [[id number]]  . hydrALAZINE (APRESOLINE) 50 MG tablet    Sig: Take 1 tablet (50 mg total) by mouth 2 (two) times daily.    Dispense:  60 tablet    Refill:  5    Order Specific Question:   Supervising Provider    Answer:   Smitty Cords [[id number]]  . atorvastatin (LIPITOR) 10 MG tablet    Sig: Take 1 tablet (10 mg total) by mouth daily.    Dispense:  90 tablet    Refill:  1    Order Specific Question:   Supervising Provider    Answer:   Smitty Cords [[id number]]     Follow up plan: Return in about 3 months (around 07/16/2017) for hypertension.  Wilhelmina Mcardle, DNP, AGPCNP-BC Adult Gerontology Primary Care Nurse Practitioner [facility] 04/20/2017, 2:23 PM

## 2017-04-20 ENCOUNTER — Encounter: Payer: Self-pay | Admitting: Nurse Practitioner

## 2017-04-20 DIAGNOSIS — F0390 Unspecified dementia without behavioral disturbance: Secondary | ICD-10-CM | POA: Insufficient documentation

## 2017-04-20 DIAGNOSIS — M8588 Other specified disorders of bone density and structure, other site: Secondary | ICD-10-CM | POA: Insufficient documentation

## 2017-04-20 DIAGNOSIS — I25118 Atherosclerotic heart disease of native coronary artery with other forms of angina pectoris: Secondary | ICD-10-CM | POA: Insufficient documentation

## 2017-04-20 DIAGNOSIS — E7800 Pure hypercholesterolemia, unspecified: Secondary | ICD-10-CM | POA: Insufficient documentation

## 2017-04-20 DIAGNOSIS — E0590 Thyrotoxicosis, unspecified without thyrotoxic crisis or storm: Secondary | ICD-10-CM | POA: Insufficient documentation

## 2017-04-20 MED ORDER — ATORVASTATIN CALCIUM 10 MG PO TABS: 10.0000 mg | ORAL_TABLET | Freq: Every day | ORAL | 1 refills | Status: DC

## 2017-04-20 NOTE — Assessment & Plan Note (Signed)
Prior hyperthyroidism onset 2016.  Pt remains on amiodarone and has not had recent TSH check.  Has been lost to followup with Dr. Tedd SiasSolum.  Plan: 1. Repeat Dexa 2. Check TSH today with CMP and lipid 3. Followup as needed.  Consider return to endocrine.

## 2017-04-20 NOTE — Assessment & Plan Note (Signed)
Identifed on lab check of lipid panel.   In pt with known CAD, prior MI, and compensated diastolic CHF would recommend statin therapy for prevention of future ASCVD events and reduction of risk of progressing dementia as it could be a vascular dementia.  START atorvastatin 10 mg once daily. Treat to LDL goal < 70.

## 2017-04-20 NOTE — Assessment & Plan Note (Signed)
Pt with evidence of mild dementia and is on aricept.  Pt with family history of dementia.  Currently unidentified type of dementia, but suspect possible vascular dementia given known ASCVD and current lipid values.   Plan: 1. Continue aricept as pt is primary caregiver for elderly sister with advanced dementia. 2. Treat lipids. 3. Followup with serial MMSE beginning with next visit and every 3-6 months.

## 2017-04-20 NOTE — Assessment & Plan Note (Signed)
Identified in 2016 with Dr. Tedd SiasSolum.  No repeat check to assess progression.  Also with hyperthyroidism in past.  See AP above.  Plan: 1. [facility] if [facility] for same machine. 2. Followup as needed after repeat scan.

## 2017-04-20 NOTE — Assessment & Plan Note (Signed)
Currently stable with rare angina.  Pt continues to be followed by Dr. Juliann Paresallwood.

## 2017-04-20 NOTE — Assessment & Plan Note (Signed)
Uncontrolled hypertension, but pt has been off lisinopril for last 3-4 weeks.  BP goal < 140/90.  Pt is not working on lifestyle modifications.  Taking medications tolerating well without side effects.  Complications: - CKD stage II - diastolic CHF  Plan: 1. Continue taking all medications as prescribed in past.   - Pt was taking torsemide qod.  Increase to M, W, F, Sat, Sun.  May need to take daily if hypertension and edema are not resolving.  2. Obtain labs CMP, TSH, Lipid today  3. Encouraged heart healthy diet and increasing exercise to 30 minutes most days of the week. 4. Check BP 1-2 x per week at home, keep log, and bring to clinic at next appointment. 5. Follow up 3 months.

## 2017-04-27 DIAGNOSIS — M8589 Other specified disorders of bone density and structure, multiple sites: Secondary | ICD-10-CM | POA: Diagnosis not present

## 2017-04-27 DIAGNOSIS — E0590 Thyrotoxicosis, unspecified without thyrotoxic crisis or storm: Secondary | ICD-10-CM | POA: Diagnosis not present

## 2017-05-04 DIAGNOSIS — M810 Age-related osteoporosis without current pathological fracture: Secondary | ICD-10-CM | POA: Diagnosis not present

## 2017-07-27 DIAGNOSIS — E890 Postprocedural hypothyroidism: Secondary | ICD-10-CM | POA: Diagnosis not present

## 2017-08-03 DIAGNOSIS — E0590 Thyrotoxicosis, unspecified without thyrotoxic crisis or storm: Secondary | ICD-10-CM | POA: Diagnosis not present

## 2017-08-11 DIAGNOSIS — Z79899 Other long term (current) drug therapy: Secondary | ICD-10-CM | POA: Diagnosis not present

## 2017-08-11 DIAGNOSIS — N183 Chronic kidney disease, stage 3 (moderate): Secondary | ICD-10-CM | POA: Diagnosis not present

## 2017-08-11 DIAGNOSIS — I471 Supraventricular tachycardia: Secondary | ICD-10-CM | POA: Diagnosis not present

## 2017-08-11 DIAGNOSIS — I10 Essential (primary) hypertension: Secondary | ICD-10-CM | POA: Diagnosis not present

## 2017-10-13 ENCOUNTER — Other Ambulatory Visit: Payer: Self-pay | Admitting: Nurse Practitioner

## 2017-10-13 DIAGNOSIS — E7800 Pure hypercholesterolemia, unspecified: Secondary | ICD-10-CM

## 2017-10-16 ENCOUNTER — Other Ambulatory Visit: Payer: Self-pay | Admitting: Nurse Practitioner

## 2017-10-16 DIAGNOSIS — I10 Essential (primary) hypertension: Secondary | ICD-10-CM

## 2017-10-16 DIAGNOSIS — F0390 Unspecified dementia without behavioral disturbance: Secondary | ICD-10-CM

## 2017-12-29 ENCOUNTER — Other Ambulatory Visit: Payer: Self-pay | Admitting: Nurse Practitioner

## 2017-12-29 DIAGNOSIS — E7800 Pure hypercholesterolemia, unspecified: Secondary | ICD-10-CM

## 2018-01-28 ENCOUNTER — Other Ambulatory Visit: Payer: Self-pay | Admitting: Nurse Practitioner

## 2018-01-28 DIAGNOSIS — I10 Essential (primary) hypertension: Secondary | ICD-10-CM

## 2018-01-31 DIAGNOSIS — R001 Bradycardia, unspecified: Secondary | ICD-10-CM | POA: Diagnosis not present

## 2018-01-31 DIAGNOSIS — N183 Chronic kidney disease, stage 3 (moderate): Secondary | ICD-10-CM | POA: Diagnosis not present

## 2018-01-31 DIAGNOSIS — I10 Essential (primary) hypertension: Secondary | ICD-10-CM | POA: Diagnosis not present

## 2018-01-31 DIAGNOSIS — I471 Supraventricular tachycardia: Secondary | ICD-10-CM | POA: Diagnosis not present

## 2018-02-02 DIAGNOSIS — E0590 Thyrotoxicosis, unspecified without thyrotoxic crisis or storm: Secondary | ICD-10-CM | POA: Diagnosis not present

## 2018-02-09 DIAGNOSIS — E0590 Thyrotoxicosis, unspecified without thyrotoxic crisis or storm: Secondary | ICD-10-CM | POA: Diagnosis not present

## 2018-04-06 DIAGNOSIS — E0590 Thyrotoxicosis, unspecified without thyrotoxic crisis or storm: Secondary | ICD-10-CM | POA: Diagnosis not present

## 2018-06-26 ENCOUNTER — Other Ambulatory Visit: Payer: Self-pay | Admitting: Nurse Practitioner

## 2018-06-26 DIAGNOSIS — E7800 Pure hypercholesterolemia, unspecified: Secondary | ICD-10-CM

## 2018-06-29 ENCOUNTER — Other Ambulatory Visit: Payer: Self-pay | Admitting: Nurse Practitioner

## 2018-06-29 DIAGNOSIS — I10 Essential (primary) hypertension: Secondary | ICD-10-CM

## 2018-07-15 ENCOUNTER — Other Ambulatory Visit: Payer: Self-pay | Admitting: Nurse Practitioner

## 2018-07-15 DIAGNOSIS — I10 Essential (primary) hypertension: Secondary | ICD-10-CM

## 2018-09-02 ENCOUNTER — Other Ambulatory Visit: Payer: Self-pay | Admitting: Nurse Practitioner

## 2018-09-02 DIAGNOSIS — F0390 Unspecified dementia without behavioral disturbance: Secondary | ICD-10-CM

## 2018-09-05 ENCOUNTER — Other Ambulatory Visit: Payer: Self-pay | Admitting: Family Medicine

## 2018-09-05 ENCOUNTER — Encounter: Payer: Self-pay | Admitting: Nurse Practitioner

## 2018-09-05 ENCOUNTER — Ambulatory Visit (INDEPENDENT_AMBULATORY_CARE_PROVIDER_SITE_OTHER): Payer: Medicare Other | Admitting: Nurse Practitioner

## 2018-09-05 ENCOUNTER — Other Ambulatory Visit: Payer: Self-pay

## 2018-09-05 VITALS — BP 187/74 | HR 54 | Ht 61.0 in | Wt 216.4 lb

## 2018-09-05 DIAGNOSIS — E7800 Pure hypercholesterolemia, unspecified: Secondary | ICD-10-CM

## 2018-09-05 DIAGNOSIS — F0390 Unspecified dementia without behavioral disturbance: Secondary | ICD-10-CM | POA: Diagnosis not present

## 2018-09-05 DIAGNOSIS — F320 Major depressive disorder, single episode, mild: Secondary | ICD-10-CM

## 2018-09-05 DIAGNOSIS — I10 Essential (primary) hypertension: Secondary | ICD-10-CM

## 2018-09-05 DIAGNOSIS — I872 Venous insufficiency (chronic) (peripheral): Secondary | ICD-10-CM | POA: Diagnosis not present

## 2018-09-05 MED ORDER — TRIAMCINOLONE ACETONIDE 0.025 % EX OINT: 1.0000 "application " | TOPICAL_OINTMENT | Freq: Two times a day (BID) | CUTANEOUS | 0 refills | Status: DC

## 2018-09-05 MED ORDER — POTASSIUM CHLORIDE CRYS ER 20 MEQ PO TBCR: EXTENDED_RELEASE_TABLET | ORAL | 0 refills | Status: AC

## 2018-09-05 MED ORDER — MEDICAL COMPRESSION STOCKINGS MISC: 2.0000 | Freq: Every day | 1 refills | Status: DC

## 2018-09-05 MED ORDER — TORSEMIDE 20 MG PO TABS: ORAL_TABLET | ORAL | 0 refills | Status: DC

## 2018-09-05 MED ORDER — SERTRALINE HCL 25 MG PO TABS: 25.0000 mg | ORAL_TABLET | Freq: Every day | ORAL | 1 refills | Status: AC

## 2018-09-05 MED ORDER — ATORVASTATIN CALCIUM 10 MG PO TABS: 10.0000 mg | ORAL_TABLET | Freq: Every day | ORAL | 1 refills | Status: AC

## 2018-09-05 MED ORDER — DONEPEZIL HCL 10 MG PO TABS: 10.0000 mg | ORAL_TABLET | Freq: Every day | ORAL | 1 refills | Status: AC

## 2018-09-05 NOTE — Progress Notes (Signed)
Subjective:    Patient ID: Melanie Lewis, female    DOB: 02/09/1934, 84 y.o.   MRN: [id number]  Melanie Lewis is a 84 y.o. female presenting on 09/05/2018 for Annual Exam (pt concern she could be having a reaction to some of her medication. She complains of itching and hives around her ankles and legs. x 2 mths )  HPI Itching and hives Located around ankles x 2 months. Patient notes her legs swell on a regular basis, usually L > R.  Swelling resolves by morning and notes very little swelling today.  Patient reports rash as darkening of skin, rough texture and raised blisters that are very itchy and open.  Resolve spontaneously, but continue appearing.  Lotions/moisturizers to date are not helping significantly.  - No new medications at approx 2 months ago.  Methimazole has been taken > 1 year.  Hypertension - She is not checking BP at home or outside of clinic.    - Current medications: carvedilol 6.25 mg bid, sotalol 40 mg bid (Rx as 80 mg bid), lisinopril 20 mg daily, torsemide daily. - Patient noted palpitations and feeling poorly when she started sotalol 80 mg bid. Patient started sotalol for 3 days and broke pill in half after having increased heart flutters that lasted for about 1-1.5 hours each day. This made her feel like she did when she was in SVT before.  It also coincides with stopping amiodarone. - She is not currently symptomatic.   Hyperthyroidism - Pt is not taking levothyroxine at this time.  Has had over-replacement in past when she developed amiodarone induced thyroiditis and has recently stopped her amiodarone.  Regular thyroid checks have occurred by Cardiology in the last year with progressive improvement.   - takes methimazole 10 mg daily - [facility] - She is not currently symptomatic. - May 2020 TSH: 0.566; Free T4: 1.04  Dementia Continues on donepezil 5 mg once daily.  Patient notes memory seems to be getting worse. - Patient continues  to take care of her sister.  Patient's daughter helps all of [name]'s family.  Her sister wanders at night and patient notes she is routinely awake for about 2-3 hours to ensure her sister's safety. - Patient notes concentration is more difficult.  "little things" like forgetting where things are.  She talks to someone and names of people she knows well she cannot remember.  Patient is not losing track of conversation and lose track of what they were talking about.    Depression Down more with caring for her sister who also has dementia,  Is now very socially isolated and noticing more changes with Coronavirus precautions. Patient's [facility] to [facility], but is [facility] due to Coronavirus at this time.  Patient also notes she is fearing her own dementia worsening as she sees her sister's worsen.  Patient's sleep disruption is also a possible factor.  Patient desires to start medication today.  [facility] 09/05/2018 04/16/2017 09/25/2014 06/29/2014 05/25/2014  Decreased Interest 1 0 0 0 0  Down, Depressed, Hopeless 1 0 1 0 1  PHQ - 2 Score 2 0 1 0 1  Altered sleeping 1 - - - -  Tired, decreased energy 1 - - - -  Change in appetite 3 - - - -  Feeling bad or failure about yourself  1 - - - -  Trouble concentrating 2 - - - -  Moving slowly or fidgety/restless 0 - - - -  Suicidal thoughts 0 - - - -  PHQ-9 Score 10 - - - -  Difficult doing work/chores Not difficult at all - - - -   Cholesterol Remains on atorvastatin 10 mg once daily.   - Pt denies changes in vision, chest tightness/pressure, palpitations, shortness of breath, leg pain while walking, leg or arm weakness, and sudden loss of speech or loss of consciousness.   Past Medical History:  Diagnosis Date  . CHF (congestive heart failure) ([place])   . Coronary artery disease   . Dementia ([place])   . Heart attack ([place])   . Hypertension   . hyperthyroid   . Hyperthyroidism   . MI (myocardial infarction) [facility])    Past  Surgical History:  Procedure Laterality Date  . CARDIAC CATHETERIZATION N/A 08/14/2014   Procedure: Left Heart Cath and Coronary Angiography;  Surgeon: Isaias Cowman, MD;  Location: [facility];  Service: Cardiovascular;  Laterality: N/A;  . CHOLECYSTECTOMY    . PARTIAL HYSTERECTOMY     Social History   Socioeconomic History  . Marital status: Widowed    Spouse name: Not on file  . Number of children: Not on file  . Years of education: Not on file  . Highest education level: Not on file  Occupational History  . Not on file  Social Needs  . Financial resource strain: Not on file  . Food insecurity    Worry: Not on file    Inability: Not on file  . Transportation needs    Medical: Not on file    Non-medical: Not on file  Tobacco Use  . Smoking status: Never Smoker  . Smokeless tobacco: Never Used  Substance and Sexual Activity  . Alcohol use: Yes    Alcohol/week: 2.0 standard drinks    Types: 2 Shots of liquor per week    Comment: occasional  . Drug use: No  . Sexual activity: Not Currently  Lifestyle  . Physical activity    Days per week: Not on file    Minutes per session: Not on file  . Stress: Not on file  Relationships  . Social Herbalist on phone: Not on file    Gets together: Not on file    Attends religious service: Not on file    Active member of club or organization: Not on file    Attends meetings of clubs or organizations: Not on file    Relationship status: Not on file  . Intimate partner violence    Fear of current or ex partner: Not on file    Emotionally abused: Not on file    Physically abused: Not on file    Forced sexual activity: Not on file  Other Topics Concern  . Not on file  Social History Narrative  . Not on file   Family History  Problem Relation Age of Onset  . Heart attack Sister   . Hypertension Sister   . Dementia Sister   . Heart attack Son   . Dementia Sister    Current Outpatient Medications on  File Prior to Visit  Medication Sig  . aspirin EC 81 MG tablet Take 81 mg by mouth daily.  Marland Kitchen atorvastatin (LIPITOR) 10 MG tablet TAKE 1 TABLET BY MOUTH EVERY DAY  . Biotin 1 MG CAPS Take by mouth.  . Calcium Carb-Cholecalciferol (CALCIUM 1000 + D PO) Calcium 600 mg + vit d 800 iu- 1 tab daily  . carvedilol (COREG) 6.25 MG tablet TAKE  1 TABLET BY MOUTH TWICE DAILY  . Cholecalciferol (VITAMIN D3) 1000 units CAPS Take by mouth.  . cloNIDine (CATAPRES) 0.1 MG tablet Take by mouth 2 (two) times daily.   Marland Kitchen. donepezil (ARICEPT) 5 MG tablet TAKE 1 TABLET BY MOUTH EVERY NIGHT AT BEDTIME  . lisinopril (ZESTRIL) 20 MG tablet TAKE 1 TABLET BY MOUTH EVERY DAY  . methimazole (TAPAZOLE) 5 MG tablet Take 2 tablets by mouth daily.  . potassium chloride SA (K-DUR,KLOR-CON) 20 MEQ tablet Take 1 tablet (20 mg total) by mouth every Monday, Wednesday, Friday, Saturday, and Sunday  . sotalol (BETAPACE) 80 MG tablet Take 40 mg by mouth 2 (two) times daily.   Marland Kitchen. torsemide (DEMADEX) 20 MG tablet Take 1 tablet (20 mg total) by mouth every Monday, Wednesday, Friday, Saturday, and Sunday. (Patient taking differently: Take 20 mg by mouth daily. Take 1 tablet (20 mg total) by mouth every Monday, Wednesday, Friday, Saturday, and Sunday.)  . hydrALAZINE (APRESOLINE) 50 MG tablet TAKE 1 TABLET BY MOUTH TWICE DAILY (Patient not taking: Reported on 09/05/2018)  . omeprazole (PRILOSEC) 40 MG capsule Take 40 mg by mouth daily.   No current facility-administered medications on file prior to visit.     Review of Systems Per HPI unless specifically indicated above     Objective:    BP (!) 187/74 (BP Location: Right Arm, Patient Position: Sitting, Cuff Size: Normal)   Pulse (!) 54   Ht 5\' 1"  (1.549 m)   Wt 216 lb 6.4 oz (98.2 kg)   LMP  (LMP Unknown)   BMI 40.89 kg/m   Wt Readings from Last 3 Encounters:  09/05/18 216 lb 6.4 oz (98.2 kg)  04/16/17 210 lb 3.2 oz (95.3 kg)  09/25/14 201 lb (91.2 kg)    Physical Exam Vitals  signs reviewed.  Constitutional:      General: She is awake. She is not in acute distress.    Appearance: She is well-developed.  HENT:     Head: Normocephalic and atraumatic.  Neck:     Musculoskeletal: Normal range of motion and neck supple.     Vascular: No carotid bruit.  Cardiovascular:     Rate and Rhythm: Normal rate and regular rhythm.     Pulses:          Radial pulses are 2+ on the right side and 2+ on the left side.       Posterior tibial pulses are 1+ on the right side and 1+ on the left side.     Heart sounds: Normal heart sounds, S1 normal and S2 normal.  Pulmonary:     Effort: Pulmonary effort is normal. No respiratory distress.     Breath sounds: Normal breath sounds and air entry.  Musculoskeletal:     Right lower leg: 1+ Pitting Edema present.     Left lower leg: 1+ Edema present.  Skin:    General: Skin is warm and dry.     Findings: Lesion (blisters) and rash present.     Comments: Patient with diffuse rash BLE that is hyperkeratotic, pruritic with scattered blisters, excoriation.  No open wounds at this time.  Rash consistent with stasis dermatitis.  Neurological:     Mental Status: She is alert and oriented to person, place, and time.  Psychiatric:        Attention and Perception: Attention normal.        Mood and Affect: Mood and affect normal.        Behavior: Behavior  normal. Behavior is cooperative.     Results for orders placed or performed in visit on 04/16/17  Lipid panel  Result Value Ref Range   Cholesterol 277 (H) <200 mg/dL   HDL 83 >81 mg/dL   Triglycerides 89 <191 mg/dL   LDL Cholesterol (Calc) 174 (H) mg/dL (calc)   Total CHOL/HDL Ratio 3.3 <5.0 (calc)   Non-HDL Cholesterol (Calc) 194 (H) <130 mg/dL (calc)  COMPLETE METABOLIC PANEL WITH GFR  Result Value Ref Range   Glucose, Bld 89 65 - 139 mg/dL   BUN 18 7 - 25 mg/dL   Creat 4.78 (H) 2.95 - 0.88 mg/dL   GFR, Est Non African American 38 (L) > OR = 60 mL/min/1.29m2   GFR, Est African  American 44 (L) > OR = 60 mL/min/1.47m2   BUN/Creatinine Ratio 14 6 - 22 (calc)   Sodium 141 135 - 146 mmol/L   Potassium 4.0 3.5 - 5.3 mmol/L   Chloride 106 98 - 110 mmol/L   CO2 27 20 - 32 mmol/L   Calcium 9.5 8.6 - 10.4 mg/dL   Total Protein 6.6 6.1 - 8.1 g/dL   Albumin 3.8 3.6 - 5.1 g/dL   Globulin 2.8 1.9 - 3.7 g/dL (calc)   AG Ratio 1.4 1.0 - 2.5 (calc)   Total Bilirubin 0.6 0.2 - 1.2 mg/dL   Alkaline phosphatase (APISO) 74 33 - 130 U/L   AST 40 (H) 10 - 35 U/L   ALT 40 (H) 6 - 29 U/L  TSH  Result Value Ref Range   TSH 0.02 (L) 0.40 - 4.50 mIU/L      Assessment & Plan:   Problem List Items Addressed This Visit      Cardiovascular and Mediastinum   Essential hypertension Uncontrolled currently.  Managed by Dr. Juliann Pares.  Communication will be sent to modify medications appropriately given patient's self-titration and existing bradycardia.  Follow-up prn.   Relevant Medications   sotalol (BETAPACE) 80 MG tablet   cloNIDine (CATAPRES) 0.1 MG tablet   atorvastatin (LIPITOR) 10 MG tablet   torsemide (DEMADEX) 20 MG tablet   potassium chloride SA (K-DUR) 20 MEQ tablet     Nervous and Auditory   Dementia without behavioral disturbance (HCC) Mildly worsening with patient losing ability to remember people's names more often.  Tolerates donepezil 5 mg once daily well without side effects.  - Increase donepezil to 10 mg once daily - Treat depression with sertraline 25 mg once daily (Prefer escitalopram, but cannot use with sotalol at this time r/t QT prolongation risk). - Follow-up 3-6 months for repeat MMSE   Relevant Medications   donepezil (ARICEPT) 10 MG tablet   sertraline (ZOLOFT) 25 MG tablet     Other   Pure hypercholesterolemia Status unknown.  Recheck labs at next visit - recommended fasting.  Continue meds without changes today.  Refills provided. Followup 3-6 months.    Relevant Medications   sotalol (BETAPACE) 80 MG tablet   cloNIDine (CATAPRES) 0.1 MG tablet    atorvastatin (LIPITOR) 10 MG tablet   torsemide (DEMADEX) 20 MG tablet    Other Visit Diagnoses    Venous stasis dermatitis of both lower extremities    -  Primary Patient with rash consistent with stasis dermatitis with pruritus.    Plan: 1. Recommend compression stockings 8-33mmHg.  Instructed to always remove at bedtime. 2. START triamcinolone 0.025% ointment bid x 7 days, repeat prn for pruritic rash. 3. Continue keeping feet elevated as able. 4. FOLLOW-UP 6 months.  Relevant Medications   Elastic Bandages & Supports (MEDICAL COMPRESSION STOCKINGS) MISC   triamcinolone (KENALOG) 0.025 % ointment   Current mild episode of major depressive disorder without prior episode [facility](HCC)     Patient with worsening depression, mild to moderate per PHQ9 without SI/HI.   - START sertraline 25 mg once daily.  Cautioned side effects.  May increase dose in 2 months if needed and if tolerated. - Encouraged patient to continue working on coping skills, increasing socialization as able via telephone, distanced visits. - Follow-up 2 months.   Relevant Medications   sertraline (ZOLOFT) 25 MG tablet      Meds ordered this encounter  Medications  . donepezil (ARICEPT) 10 MG tablet    Sig: Take 1 tablet (10 mg total) by mouth at bedtime.    Dispense:  90 tablet    Refill:  1    Order Specific Question:   Supervising Provider    Answer:   Smitty CordsKARAMALEGOS, ALEXANDER J [[id number]]  . Elastic Bandages & Supports (MEDICAL COMPRESSION STOCKINGS) MISC    Sig: 2 Devices by Does not apply route daily. Wear daily and remove at bedtime.  8-15 mm Hg compression strength.    Dispense:  4 each    Refill:  1    Order Specific Question:   Supervising Provider    Answer:   Smitty CordsKARAMALEGOS, ALEXANDER J [[id number]]  . sertraline (ZOLOFT) 25 MG tablet    Sig: Take 1 tablet (25 mg total) by mouth daily.    Dispense:  90 tablet    Refill:  1    Order Specific Question:   Supervising Provider    Answer:   Smitty CordsKARAMALEGOS, ALEXANDER J  [[id number]]  . triamcinolone (KENALOG) 0.025 % ointment    Sig: Apply 1 application topically 2 (two) times daily.    Dispense:  30 g    Refill:  0    Order Specific Question:   Supervising Provider    Answer:   Smitty CordsKARAMALEGOS, ALEXANDER J [[id number]]  . atorvastatin (LIPITOR) 10 MG tablet    Sig: Take 1 tablet (10 mg total) by mouth daily.    Dispense:  90 tablet    Refill:  1    Order Specific Question:   Supervising Provider    Answer:   Smitty CordsKARAMALEGOS, ALEXANDER J [[id number]]  . torsemide (DEMADEX) 20 MG tablet    Sig: Take 1 tablet (20 mg total) by mouth every day    Dispense:  30 tablet    Refill:  0    Order Specific Question:   Supervising Provider    Answer:   Smitty CordsKARAMALEGOS, ALEXANDER J [[id number]]  . potassium chloride SA (K-DUR) 20 MEQ tablet    Sig: Take 1 tablet (20 mg total) by mouth every day    Dispense:  30 tablet    Refill:  0    Order Specific Question:   Supervising Provider    Answer:   Smitty CordsKARAMALEGOS, ALEXANDER J [[id number]]     Follow up plan: Return in about 2 months (around 11/05/2018) for depression, venous stasis.  Wilhelmina McardleLauren Broxton Broady, DNP, AGPCNP-BC Adult Gerontology Primary Care Nurse Practitioner [facility] 09/05/2018, 8:41 AM

## 2018-09-05 NOTE — Patient Instructions (Addendum)
Melanie Lewis,   Thank you for coming in to clinic today.  1. START sertraline 25 mg once daily for depression.  2. CONTINUE on 40 mg sotalol every 12 hours until you followup with Dr. Clayborn Bigness.  Call his office if you are able before next visit. BP remains too high and your heart rate is possibly too low with your full dose.  3. For your legs / skin - this is venous stasis dermatitis - START wearing compression socks during daytime.  Remove at bedtime.  - Continue good moisturizer on your skin. - For itchiness, use triamcinolone 0.25% ointment daily for up to 7 days in a row before taking a break.  Please schedule a follow-up appointment with [name], AGNP. Return in about 2 months (around 11/05/2018) for depression, venous stasis.  If you have any other questions or concerns, please feel free to call the clinic or send a message through [place]. You may also schedule an earlier appointment if necessary.  You will receive a survey after today's visit either digitally by e-mail or paper by [employer]. Your experiences and feedback matter to [place].  Please respond so we know how we are doing as we provide care for you.   [name], DNP, AGNP-BC Adult Gerontology Nurse Practitioner [facility]   Chronic Venous Insufficiency Chronic venous insufficiency is a condition where the leg veins cannot effectively pump blood from the legs to the heart. This happens when the vein walls are either stretched, weakened, or damaged, or when the valves inside the vein are damaged. With the right treatment, you should be able to continue with an active life. This condition is also called venous stasis. What are the causes? Common causes of this condition include:  High blood pressure inside the veins (venous hypertension).  Sitting or standing too long, causing increased blood pressure in the leg veins.  A blood clot that blocks blood flow in a vein (deep vein  thrombosis, DVT).  Inflammation of a vein (phlebitis) that causes a blood clot to form.  Tumors in the pelvis that cause blood to back up. What increases the risk? The following factors may make you more likely to develop this condition:  Having a family history of this condition.  Obesity.  Pregnancy.  Living without enough regular physical activity or exercise (sedentary lifestyle).  Smoking.  Having a job that requires long periods of standing or sitting in one place.  Being a certain age. Women in their 31s and 16s and men in their 20s are more likely to develop this condition. What are the signs or symptoms? Symptoms of this condition include:  Veins that are enlarged, bulging, or twisted (varicose veins).  Skin breakdown or ulcers.  Reddened skin or dark discoloration of skin on the leg between the knee and ankle.  Brown, smooth, tight, and painful skin just above the ankle, usually on the inside of the leg (lipodermatosclerosis).  Swelling of the legs. How is this diagnosed? This condition may be diagnosed based on:  Your medical history.  A physical exam.  Tests, such as: ? A procedure that creates an image of a blood vessel and nearby organs and provides information about blood flow through the blood vessel (duplex ultrasound). ? A procedure that tests blood flow (plethysmography). ? A procedure that looks at the veins using X-ray and dye (venogram). How is this treated? The goals of treatment are to help you return to an active life and to  minimize pain or disability. Treatment depends on the severity of your condition, and it may include:  Wearing compression stockings. These can help relieve symptoms and help prevent your condition from getting worse. However, they do not cure the condition.  Sclerotherapy. This procedure involves an injection of a solution that shrinks damaged veins.  Surgery. This may involve: ? Removing a diseased vein (vein  stripping). ? Cutting off blood flow through the vein (laser ablation surgery). ? Repairing or reconstructing a valve within the affected vein. Follow these instructions at home:      Wear compression stockings as told by your health care provider. These stockings help to prevent blood clots and reduce swelling in your legs.  Take over-the-counter and prescription medicines only as told by your health care provider.  Stay active by exercising, walking, or doing different activities. Ask your health care provider what activities are safe for you and how much exercise you need.  Drink enough fluid to keep your urine pale yellow.  Do not use any products that contain nicotine or tobacco, such as cigarettes, e-cigarettes, and chewing tobacco. If you need help quitting, ask your health care provider.  Keep all follow-up visits as told by your health care provider. This is important. Contact a health care provider if you:  Have redness, swelling, or more pain in the affected area.  See a red streak or line that goes up or down from the affected area.  Have skin breakdown or skin loss in the affected area, even if the breakdown is small.  Get an injury in the affected area. Get help right away if:  You get an injury and an open wound in the affected area.  You have: ? Severe pain that does not get better with medicine. ? Sudden numbness or weakness in the foot or ankle below the affected area. ? Trouble moving your foot or ankle. ? A fever. ? Worse or persistent symptoms. ? Chest pain. ? Shortness of breath. Summary  Chronic venous insufficiency is a condition where the leg veins cannot effectively pump blood from the legs to the heart.  Chronic venous insufficiency occurs when the vein walls become stretched, weakened, or damaged, or when valves within the vein are damaged.  Treatment depends on how severe your condition is. It often involves wearing compression stockings and  may involve having a procedure.  Make sure you stay active by exercising, walking, or doing different activities. Ask your health care provider what activities are safe for you and how much exercise you need. This information is not intended to replace advice given to you by your health care provider. Make sure you discuss any questions you have with your health care provider. Document Released: 05/04/2006 Document Revised: 09/21/2017 Document Reviewed: 09/21/2017 Elsevier Patient Education  2020 [employer]

## 2018-11-04 ENCOUNTER — Other Ambulatory Visit: Payer: Self-pay | Admitting: Nurse Practitioner

## 2018-11-04 ENCOUNTER — Ambulatory Visit: Payer: Self-pay | Admitting: Nurse Practitioner

## 2019-01-19 ENCOUNTER — Other Ambulatory Visit: Payer: Self-pay | Admitting: Family Medicine

## 2019-01-19 DIAGNOSIS — I10 Essential (primary) hypertension: Secondary | ICD-10-CM

## 2019-02-18 ENCOUNTER — Other Ambulatory Visit: Payer: Self-pay | Admitting: Family Medicine

## 2019-02-18 ENCOUNTER — Other Ambulatory Visit: Payer: Self-pay | Admitting: Nurse Practitioner

## 2019-02-18 DIAGNOSIS — I10 Essential (primary) hypertension: Secondary | ICD-10-CM

## 2019-02-18 DIAGNOSIS — F320 Major depressive disorder, single episode, mild: Secondary | ICD-10-CM

## 2019-02-18 DIAGNOSIS — E7800 Pure hypercholesterolemia, unspecified: Secondary | ICD-10-CM

## 2019-03-01 ENCOUNTER — Other Ambulatory Visit
Admission: RE | Admit: 2019-03-01 | Discharge: 2019-03-01 | Disposition: A | Discharge status: Home / Self Care | Payer: Medicare Other | Source: Ambulatory Visit | Attending: Infectious Diseases | Admitting: Infectious Diseases

## 2019-03-01 DIAGNOSIS — R0600 Dyspnea, unspecified: Secondary | ICD-10-CM | POA: Insufficient documentation

## 2019-03-01 DIAGNOSIS — I5032 Chronic diastolic (congestive) heart failure: Secondary | ICD-10-CM | POA: Diagnosis present

## 2019-03-01 DIAGNOSIS — E7800 Pure hypercholesterolemia, unspecified: Secondary | ICD-10-CM | POA: Diagnosis present

## 2019-03-01 LAB — BRAIN NATRIURETIC PEPTIDE: B Natriuretic Peptide: 210 pg/mL — ABNORMAL HIGH (ref 0.0–100.0)

## 2019-04-24 ENCOUNTER — Other Ambulatory Visit: Payer: Self-pay | Admitting: Family Medicine

## 2019-04-24 DIAGNOSIS — I10 Essential (primary) hypertension: Secondary | ICD-10-CM

## 2019-06-16 ENCOUNTER — Other Ambulatory Visit: Payer: Self-pay | Admitting: Family Medicine

## 2019-06-16 DIAGNOSIS — I10 Essential (primary) hypertension: Secondary | ICD-10-CM

## 2019-07-28 ENCOUNTER — Inpatient Hospital Stay
Admission: EM | Admit: 2019-07-28 | Discharge: 2019-09-05 | DRG: 915 | Disposition: A | Discharge status: Skilled Nursing Facility | Payer: Medicare Other | Attending: Internal Medicine | Admitting: Internal Medicine

## 2019-07-28 ENCOUNTER — Emergency Department: Payer: Medicare Other

## 2019-07-28 ENCOUNTER — Other Ambulatory Visit: Payer: Self-pay

## 2019-07-28 DIAGNOSIS — J9600 Acute respiratory failure, unspecified whether with hypoxia or hypercapnia: Secondary | ICD-10-CM

## 2019-07-28 DIAGNOSIS — I499 Cardiac arrhythmia, unspecified: Secondary | ICD-10-CM | POA: Diagnosis not present

## 2019-07-28 DIAGNOSIS — E0590 Thyrotoxicosis, unspecified without thyrotoxic crisis or storm: Secondary | ICD-10-CM | POA: Diagnosis present

## 2019-07-28 DIAGNOSIS — Z4659 Encounter for fitting and adjustment of other gastrointestinal appliance and device: Secondary | ICD-10-CM

## 2019-07-28 DIAGNOSIS — Z66 Do not resuscitate: Secondary | ICD-10-CM | POA: Diagnosis present

## 2019-07-28 DIAGNOSIS — R599 Enlarged lymph nodes, unspecified: Secondary | ICD-10-CM | POA: Diagnosis not present

## 2019-07-28 DIAGNOSIS — Z7189 Other specified counseling: Secondary | ICD-10-CM | POA: Diagnosis not present

## 2019-07-28 DIAGNOSIS — J189 Pneumonia, unspecified organism: Secondary | ICD-10-CM

## 2019-07-28 DIAGNOSIS — E785 Hyperlipidemia, unspecified: Secondary | ICD-10-CM | POA: Diagnosis present

## 2019-07-28 DIAGNOSIS — J9601 Acute respiratory failure with hypoxia: Secondary | ICD-10-CM | POA: Diagnosis present

## 2019-07-28 DIAGNOSIS — R1312 Dysphagia, oropharyngeal phase: Secondary | ICD-10-CM | POA: Diagnosis not present

## 2019-07-28 DIAGNOSIS — Z7982 Long term (current) use of aspirin: Secondary | ICD-10-CM

## 2019-07-28 DIAGNOSIS — I480 Paroxysmal atrial fibrillation: Secondary | ICD-10-CM | POA: Diagnosis present

## 2019-07-28 DIAGNOSIS — E870 Hyperosmolality and hypernatremia: Secondary | ICD-10-CM | POA: Diagnosis not present

## 2019-07-28 DIAGNOSIS — I472 Ventricular tachycardia: Secondary | ICD-10-CM | POA: Diagnosis not present

## 2019-07-28 DIAGNOSIS — G92 Toxic encephalopathy: Secondary | ICD-10-CM | POA: Diagnosis not present

## 2019-07-28 DIAGNOSIS — R569 Unspecified convulsions: Secondary | ICD-10-CM | POA: Diagnosis not present

## 2019-07-28 DIAGNOSIS — Z20822 Contact with and (suspected) exposure to covid-19: Secondary | ICD-10-CM | POA: Diagnosis present

## 2019-07-28 DIAGNOSIS — Z9049 Acquired absence of other specified parts of digestive tract: Secondary | ICD-10-CM

## 2019-07-28 DIAGNOSIS — I2510 Atherosclerotic heart disease of native coronary artery without angina pectoris: Secondary | ICD-10-CM | POA: Diagnosis present

## 2019-07-28 DIAGNOSIS — E063 Autoimmune thyroiditis: Secondary | ICD-10-CM | POA: Diagnosis present

## 2019-07-28 DIAGNOSIS — E7800 Pure hypercholesterolemia, unspecified: Secondary | ICD-10-CM | POA: Diagnosis present

## 2019-07-28 DIAGNOSIS — R9389 Abnormal findings on diagnostic imaging of other specified body structures: Secondary | ICD-10-CM

## 2019-07-28 DIAGNOSIS — Z8249 Family history of ischemic heart disease and other diseases of the circulatory system: Secondary | ICD-10-CM

## 2019-07-28 DIAGNOSIS — F0390 Unspecified dementia without behavioral disturbance: Secondary | ICD-10-CM | POA: Diagnosis present

## 2019-07-28 DIAGNOSIS — I4891 Unspecified atrial fibrillation: Secondary | ICD-10-CM

## 2019-07-28 DIAGNOSIS — E8809 Other disorders of plasma-protein metabolism, not elsewhere classified: Secondary | ICD-10-CM | POA: Diagnosis not present

## 2019-07-28 DIAGNOSIS — K1121 Acute sialoadenitis: Secondary | ICD-10-CM | POA: Diagnosis not present

## 2019-07-28 DIAGNOSIS — I5032 Chronic diastolic (congestive) heart failure: Secondary | ICD-10-CM

## 2019-07-28 DIAGNOSIS — R112 Nausea with vomiting, unspecified: Secondary | ICD-10-CM

## 2019-07-28 DIAGNOSIS — Z9911 Dependence on respirator [ventilator] status: Secondary | ICD-10-CM

## 2019-07-28 DIAGNOSIS — R739 Hyperglycemia, unspecified: Secondary | ICD-10-CM | POA: Diagnosis not present

## 2019-07-28 DIAGNOSIS — Z888 Allergy status to other drugs, medicaments and biological substances status: Secondary | ICD-10-CM

## 2019-07-28 DIAGNOSIS — Z515 Encounter for palliative care: Secondary | ICD-10-CM | POA: Diagnosis not present

## 2019-07-28 DIAGNOSIS — I110 Hypertensive heart disease with heart failure: Secondary | ICD-10-CM | POA: Diagnosis present

## 2019-07-28 DIAGNOSIS — E876 Hypokalemia: Secondary | ICD-10-CM | POA: Diagnosis not present

## 2019-07-28 DIAGNOSIS — G7281 Critical illness myopathy: Secondary | ICD-10-CM | POA: Diagnosis not present

## 2019-07-28 DIAGNOSIS — I160 Hypertensive urgency: Secondary | ICD-10-CM | POA: Diagnosis not present

## 2019-07-28 DIAGNOSIS — T783XXA Angioneurotic edema, initial encounter: Secondary | ICD-10-CM | POA: Diagnosis present

## 2019-07-28 DIAGNOSIS — T464X5A Adverse effect of angiotensin-converting-enzyme inhibitors, initial encounter: Secondary | ICD-10-CM | POA: Diagnosis present

## 2019-07-28 DIAGNOSIS — J9602 Acute respiratory failure with hypercapnia: Secondary | ICD-10-CM | POA: Diagnosis present

## 2019-07-28 DIAGNOSIS — N179 Acute kidney failure, unspecified: Secondary | ICD-10-CM

## 2019-07-28 DIAGNOSIS — R5381 Other malaise: Secondary | ICD-10-CM | POA: Diagnosis not present

## 2019-07-28 DIAGNOSIS — G9341 Metabolic encephalopathy: Secondary | ICD-10-CM | POA: Diagnosis not present

## 2019-07-28 DIAGNOSIS — Z90711 Acquired absence of uterus with remaining cervical stump: Secondary | ICD-10-CM

## 2019-07-28 DIAGNOSIS — I10 Essential (primary) hypertension: Secondary | ICD-10-CM | POA: Diagnosis not present

## 2019-07-28 DIAGNOSIS — R400 Somnolence: Secondary | ICD-10-CM | POA: Diagnosis not present

## 2019-07-28 DIAGNOSIS — Z6836 Body mass index (BMI) 36.0-36.9, adult: Secondary | ICD-10-CM

## 2019-07-28 DIAGNOSIS — I252 Old myocardial infarction: Secondary | ICD-10-CM | POA: Diagnosis not present

## 2019-07-28 DIAGNOSIS — Z79899 Other long term (current) drug therapy: Secondary | ICD-10-CM

## 2019-07-28 DIAGNOSIS — R1310 Dysphagia, unspecified: Secondary | ICD-10-CM

## 2019-07-28 DIAGNOSIS — R509 Fever, unspecified: Secondary | ICD-10-CM | POA: Diagnosis not present

## 2019-07-28 DIAGNOSIS — R4182 Altered mental status, unspecified: Secondary | ICD-10-CM | POA: Diagnosis not present

## 2019-07-28 DIAGNOSIS — R339 Retention of urine, unspecified: Secondary | ICD-10-CM | POA: Diagnosis not present

## 2019-07-28 DIAGNOSIS — T783XXD Angioneurotic edema, subsequent encounter: Secondary | ICD-10-CM | POA: Diagnosis not present

## 2019-07-28 LAB — CBC
HCT: 37.6 % (ref 36.0–46.0)
Hemoglobin: 12.8 g/dL (ref 12.0–15.0)
MCH: 29.4 pg (ref 26.0–34.0)
MCHC: 34 g/dL (ref 30.0–36.0)
MCV: 86.4 fL (ref 80.0–100.0)
Platelets: 149 10*3/uL — ABNORMAL LOW (ref 150–400)
RBC: 4.35 MIL/uL (ref 3.87–5.11)
RDW: 13.4 % (ref 11.5–15.5)
WBC: 6.6 10*3/uL (ref 4.0–10.5)
nRBC: 0 % (ref 0.0–0.2)

## 2019-07-28 LAB — BLOOD GAS, ARTERIAL
Acid-Base Excess: 1.8 mmol/L (ref 0.0–2.0)
Bicarbonate: 25.7 mmol/L (ref 20.0–28.0)
FIO2: 0.5
MECHVT: 500 mL
O2 Saturation: 99.6 %
PEEP: 5 cmH2O
Patient temperature: 37
RATE: 15 resp/min
pCO2 arterial: 37 mmHg (ref 32.0–48.0)
pH, Arterial: 7.45 (ref 7.350–7.450)
pO2, Arterial: 175 mmHg — ABNORMAL HIGH (ref 83.0–108.0)

## 2019-07-28 LAB — BASIC METABOLIC PANEL
Anion gap: 9 (ref 5–15)
BUN: 13 mg/dL (ref 8–23)
CO2: 26 mmol/L (ref 22–32)
Calcium: 9.4 mg/dL (ref 8.9–10.3)
Chloride: 106 mmol/L (ref 98–111)
Creatinine, Ser: 1.44 mg/dL — ABNORMAL HIGH (ref 0.44–1.00)
GFR calc Af Amer: 39 mL/min — ABNORMAL LOW (ref >60)
GFR calc non Af Amer: 34 mL/min — ABNORMAL LOW (ref >60)
Glucose, Bld: 93 mg/dL (ref 70–99)
Potassium: 3.8 mmol/L (ref 3.5–5.1)
Sodium: 141 mmol/L (ref 135–145)

## 2019-07-28 LAB — TYPE AND SCREEN
ABO/RH(D): O POS
Antibody Screen: NEGATIVE

## 2019-07-28 LAB — SARS CORONAVIRUS 2 BY RT PCR (HOSPITAL ORDER, PERFORMED IN ~~LOC~~ HOSPITAL LAB): SARS Coronavirus 2: NEGATIVE

## 2019-07-28 LAB — ABO/RH: ABO/RH(D): O POS

## 2019-07-28 LAB — MRSA PCR SCREENING: MRSA by PCR: NEGATIVE

## 2019-07-28 LAB — TRIGLYCERIDES: Triglycerides: 93 mg/dL (ref <150)

## 2019-07-28 MED ORDER — SODIUM CHLORIDE 0.9% IV SOLUTION
Freq: Once | INTRAVENOUS | Status: DC
  Filled 2019-07-28: qty 250

## 2019-07-28 MED ORDER — FENTANYL CITRATE (PF) 100 MCG/2ML IJ SOLN: 25.0000 ug | Freq: Once | INTRAMUSCULAR | Status: DC

## 2019-07-28 MED ORDER — PROPOFOL 1000 MG/100ML IV EMUL
INTRAVENOUS | Status: AC
  Administered 2019-07-28: 15 ug/kg/min via INTRAVENOUS
  Filled 2019-07-28: qty 100

## 2019-07-28 MED ORDER — HYDROMORPHONE HCL 1 MG/ML IJ SOLN: 1.0000 mg | Freq: Once | INTRAMUSCULAR | Status: DC

## 2019-07-28 MED ORDER — FENTANYL 2500MCG IN NS 250ML (10MCG/ML) PREMIX INFUSION
0.0000 ug/h | INTRAVENOUS | Status: DC
  Administered 2019-07-28: 100 ug/h via INTRAVENOUS

## 2019-07-28 MED ORDER — FENTANYL CITRATE (PF) 100 MCG/2ML IJ SOLN: 50.0000 ug | Freq: Once | INTRAMUSCULAR | Status: DC

## 2019-07-28 MED ORDER — LORAZEPAM 2 MG/ML IJ SOLN
2.0000 mg | INTRAMUSCULAR | Status: DC | PRN
  Administered 2019-07-28 – 2019-08-15 (×2): 2 mg via INTRAVENOUS
  Filled 2019-07-28 (×2): qty 1

## 2019-07-28 MED ORDER — SUCCINYLCHOLINE CHLORIDE 20 MG/ML IJ SOLN
150.0000 mg | Freq: Once | INTRAMUSCULAR | Status: AC
  Administered 2019-07-28: 150 mg via INTRAVENOUS

## 2019-07-28 MED ORDER — ORAL CARE MOUTH RINSE
15.0000 mL | OROMUCOSAL | Status: DC
  Administered 2019-07-28 – 2019-08-21 (×220): 15 mL via OROMUCOSAL

## 2019-07-28 MED ORDER — FENTANYL CITRATE (PF) 100 MCG/2ML IJ SOLN
50.0000 ug | INTRAMUSCULAR | Status: DC | PRN
  Administered 2019-07-28: 50 ug via INTRAVENOUS

## 2019-07-28 MED ORDER — HYDRALAZINE HCL 20 MG/ML IJ SOLN
INTRAMUSCULAR | Status: AC
  Filled 2019-07-28: qty 1

## 2019-07-28 MED ORDER — FAMOTIDINE IN NACL 20-0.9 MG/50ML-% IV SOLN
20.0000 mg | INTRAVENOUS | Status: DC
  Administered 2019-07-29 – 2019-08-11 (×14): 20 mg via INTRAVENOUS
  Filled 2019-07-28 (×14): qty 50

## 2019-07-28 MED ORDER — SODIUM CHLORIDE 0.9% FLUSH
3.0000 mL | Freq: Two times a day (BID) | INTRAVENOUS | Status: DC
  Administered 2019-07-28 – 2019-09-05 (×71): 3 mL via INTRAVENOUS

## 2019-07-28 MED ORDER — ETOMIDATE 2 MG/ML IV SOLN
30.0000 mg | Freq: Once | INTRAVENOUS | Status: AC
  Administered 2019-07-28: 30 mg via INTRAVENOUS

## 2019-07-28 MED ORDER — POLYETHYLENE GLYCOL 3350 17 G PO PACK: 17.0000 g | PACK | Freq: Every day | ORAL | Status: DC | PRN

## 2019-07-28 MED ORDER — DOCUSATE SODIUM 100 MG PO CAPS: 100.0000 mg | ORAL_CAPSULE | Freq: Two times a day (BID) | ORAL | Status: DC | PRN

## 2019-07-28 MED ORDER — FENTANYL BOLUS VIA INFUSION
25.0000 ug | INTRAVENOUS | Status: DC | PRN
  Filled 2019-07-28: qty 25

## 2019-07-28 MED ORDER — SODIUM CHLORIDE 0.9 % IV SOLN
250.0000 mL | INTRAVENOUS | Status: DC | PRN
  Administered 2019-08-05 – 2019-08-10 (×2): 250 mL via INTRAVENOUS

## 2019-07-28 MED ORDER — HYDRALAZINE HCL 20 MG/ML IJ SOLN
10.0000 mg | Freq: Four times a day (QID) | INTRAMUSCULAR | Status: DC | PRN
  Administered 2019-07-28: 10 mg via INTRAVENOUS
  Filled 2019-07-28: qty 1

## 2019-07-28 MED ORDER — ONDANSETRON HCL 4 MG/2ML IJ SOLN
4.0000 mg | Freq: Four times a day (QID) | INTRAMUSCULAR | Status: DC | PRN
  Administered 2019-08-13: 4 mg via INTRAVENOUS
  Filled 2019-07-28: qty 2

## 2019-07-28 MED ORDER — PROPOFOL 1000 MG/100ML IV EMUL
0.0000 ug/kg/min | INTRAVENOUS | Status: DC
  Administered 2019-07-28 – 2019-08-01 (×24): 50 ug/kg/min via INTRAVENOUS
  Administered 2019-08-01 (×3): 40 ug/kg/min via INTRAVENOUS
  Administered 2019-08-01: 50 ug/kg/min via INTRAVENOUS
  Administered 2019-08-01 – 2019-08-03 (×11): 40 ug/kg/min via INTRAVENOUS
  Administered 2019-08-03: 30 ug/kg/min via INTRAVENOUS
  Administered 2019-08-04 (×6): 40 ug/kg/min via INTRAVENOUS
  Administered 2019-08-05: 25 ug/kg/min via INTRAVENOUS
  Administered 2019-08-05 (×3): 40 ug/kg/min via INTRAVENOUS
  Administered 2019-08-05: 25 ug/kg/min via INTRAVENOUS
  Administered 2019-08-06: 20 ug/kg/min via INTRAVENOUS
  Administered 2019-08-06: 10 ug/kg/min via INTRAVENOUS
  Filled 2019-07-28 (×53): qty 100

## 2019-07-28 MED ORDER — FENTANYL 2500MCG IN NS 250ML (10MCG/ML) PREMIX INFUSION
INTRAVENOUS | Status: AC
  Filled 2019-07-28: qty 250

## 2019-07-28 MED ORDER — FENTANYL 2500MCG IN NS 250ML (10MCG/ML) PREMIX INFUSION: 25.0000 ug/h | INTRAVENOUS | Status: DC

## 2019-07-28 MED ORDER — HYDRALAZINE HCL 20 MG/ML IJ SOLN
10.0000 mg | Freq: Once | INTRAMUSCULAR | Status: AC
  Administered 2019-07-28: 10 mg via INTRAVENOUS

## 2019-07-28 MED ORDER — FENTANYL CITRATE (PF) 100 MCG/2ML IJ SOLN: 50.0000 ug | INTRAMUSCULAR | Status: DC | PRN

## 2019-07-28 MED ORDER — DEXAMETHASONE SODIUM PHOSPHATE 4 MG/ML IJ SOLN
4.0000 mg | Freq: Four times a day (QID) | INTRAMUSCULAR | Status: DC
  Administered 2019-07-28 – 2019-08-07 (×39): 4 mg via INTRAVENOUS
  Filled 2019-07-28 (×39): qty 1

## 2019-07-28 MED ORDER — FENTANYL CITRATE (PF) 100 MCG/2ML IJ SOLN: 75.0000 ug | Freq: Once | INTRAMUSCULAR | Status: DC

## 2019-07-28 MED ORDER — METHYLPREDNISOLONE SODIUM SUCC 125 MG IJ SOLR
125.0000 mg | Freq: Once | INTRAMUSCULAR | Status: AC
  Administered 2019-07-28: 125 mg via INTRAVENOUS

## 2019-07-28 MED ORDER — ACETAMINOPHEN 325 MG PO TABS: 650.0000 mg | ORAL_TABLET | ORAL | Status: DC | PRN

## 2019-07-28 MED ORDER — PROPOFOL 1000 MG/100ML IV EMUL: 0.0000 ug/kg/min | INTRAVENOUS | Status: DC

## 2019-07-28 MED ORDER — SODIUM CHLORIDE 0.9% FLUSH
3.0000 mL | INTRAVENOUS | Status: DC | PRN
  Administered 2019-08-19: 3 mL via INTRAVENOUS

## 2019-07-28 MED ORDER — FENTANYL BOLUS VIA INFUSION
25.0000 ug | INTRAVENOUS | Status: DC | PRN
  Administered 2019-07-31: 25 ug via INTRAVENOUS
  Filled 2019-07-28: qty 25

## 2019-07-28 MED ORDER — FAMOTIDINE IN NACL 20-0.9 MG/50ML-% IV SOLN
20.0000 mg | Freq: Once | INTRAVENOUS | Status: AC
  Administered 2019-07-28: 20 mg via INTRAVENOUS

## 2019-07-28 MED ORDER — DOCUSATE SODIUM 50 MG/5ML PO LIQD
100.0000 mg | Freq: Two times a day (BID) | ORAL | Status: DC
  Administered 2019-07-29 – 2019-08-07 (×20): 100 mg via ORAL
  Filled 2019-07-28 (×20): qty 10

## 2019-07-28 MED ORDER — POLYETHYLENE GLYCOL 3350 17 G PO PACK
17.0000 g | PACK | Freq: Every day | ORAL | Status: DC
  Administered 2019-07-29 – 2019-08-11 (×14): 17 g via ORAL
  Filled 2019-07-28 (×14): qty 1

## 2019-07-28 MED ORDER — CHLORHEXIDINE GLUCONATE 0.12% ORAL RINSE (MEDLINE KIT)
15.0000 mL | Freq: Two times a day (BID) | OROMUCOSAL | Status: DC
  Administered 2019-07-28 – 2019-09-05 (×77): 15 mL via OROMUCOSAL

## 2019-07-28 MED ORDER — ENOXAPARIN SODIUM 40 MG/0.4ML ~~LOC~~ SOLN
40.0000 mg | SUBCUTANEOUS | Status: DC
  Administered 2019-07-28 – 2019-07-29 (×2): 40 mg via SUBCUTANEOUS
  Filled 2019-07-28 (×2): qty 0.4

## 2019-07-28 MED ORDER — FENTANYL 2500MCG IN NS 250ML (10MCG/ML) PREMIX INFUSION
25.0000 ug/h | INTRAVENOUS | Status: DC
  Administered 2019-07-28 – 2019-08-05 (×16): 200 ug/h via INTRAVENOUS
  Administered 2019-08-06: 50 ug/h via INTRAVENOUS
  Filled 2019-07-28 (×16): qty 250

## 2019-07-28 MED ORDER — DIPHENHYDRAMINE HCL 50 MG/ML IJ SOLN
25.0000 mg | Freq: Once | INTRAMUSCULAR | Status: AC
  Administered 2019-07-28: 25 mg via INTRAVENOUS

## 2019-07-28 NOTE — ED Provider Notes (Signed)
[facility] Emergency Department Provider Note  ____________________________________________   First MD Initiated Contact with Patient 07/28/19 1239     (approximate)  I have reviewed the triage vital signs and the nursing notes.   HISTORY  Chief Complaint Angioedema    HPI Melanie Lewis is a 85 y.o. female  With h/o HTN, HLD, CAD, CHF here with lip swelling. Began this AM. Pt states she first noticed lower lip swelling, and this has progressed to sensation of tongue swelling and some neck fullness. She is on Lisinopril (has been on for years) and did take a dose this AM. No other new drug exposures or medications or supplements. No rash, nausea, vomiting, wheezing. Sx seem to be progressing throughout the morning. She denies any overt SOB, but does feel like her voice has changes.        Past Medical History:  Diagnosis Date  . CHF (congestive heart failure) (HCC)   . Coronary artery disease   . Dementia (HCC)   . Heart attack (HCC)   . Hypertension   . hyperthyroid   . Hyperthyroidism   . MI (myocardial infarction) [facility])     Patient Active Problem List   Diagnosis Date Noted  . Angioedema 07/28/2019  . Hyperthyroidism 04/20/2017  . Coronary artery disease of native artery of native heart with stable angina pectoris (HCC) 04/20/2017  . Pure hypercholesterolemia 04/20/2017  . Dementia without behavioral disturbance (HCC) 04/20/2017  . Osteopenia of lumbar spine 04/20/2017  . History of non-ST elevation myocardial infarction (NSTEMI) 08/14/2014  . Chronic diastolic heart failure (HCC) 06/29/2014  . Paroxysmal supraventricular tachycardia (HCC) 06/23/2014  . Essential hypertension 04/19/2014  . Bradycardia 04/19/2014    Past Surgical History:  Procedure Laterality Date  . CARDIAC CATHETERIZATION N/A 08/14/2014   Procedure: Left Heart Cath and Coronary Angiography;  Surgeon: Marcina Millard, MD;  Location: [facility] INVASIVE CV LAB;   Service: Cardiovascular;  Laterality: N/A;  . CHOLECYSTECTOMY    . PARTIAL HYSTERECTOMY      Prior to Admission medications   Medication Sig Start Date End Date Taking? Authorizing Provider  amiodarone (PACERONE) 200 MG tablet Take 100 mg by mouth daily. 04/28/19  Yes [provider]  aspirin EC 81 MG tablet Take 81 mg by mouth daily.   Yes [provider]  atorvastatin (LIPITOR) 10 MG tablet Take 1 tablet (10 mg total) by mouth daily. 09/05/18  Yes Galen Manila, NP  Biotin 1 MG CAPS Take by mouth.   Yes [provider]  Calcium Carb-Cholecalciferol (CALCIUM 1000 + D PO) Calcium 600 mg + vit d 800 iu- 1 tab daily   Yes [provider]  carvedilol (COREG) 12.5 MG tablet Take 12.5 mg by mouth 2 (two) times daily with a meal.   Yes [provider]  Cholecalciferol (VITAMIN D3) 1000 units CAPS Take by mouth.   Yes [provider]  cloNIDine (CATAPRES) 0.1 MG tablet Take by mouth 2 (two) times daily.  07/25/18 07/28/19 Yes [provider]  donepezil (ARICEPT) 10 MG tablet Take 1 tablet (10 mg total) by mouth at bedtime. 09/05/18  Yes Galen Manila, NP  hydrALAZINE (APRESOLINE) 25 MG tablet Take 25 mg by mouth 2 (two) times daily. 06/24/19  Yes [provider]  lisinopril (ZESTRIL) 20 MG tablet TAKE 1 TABLET BY MOUTH EVERY DAY 01/20/19  Yes Karamalegos, Netta Neat, DO  memantine (NAMENDA) 5 MG tablet Take 5 mg by mouth 2 (two) times daily. 07/19/19  Yes [provider]  methimazole (TAPAZOLE) 5 MG tablet Take 0.5 tablets by mouth daily.  04/13/18  Yes [provider]  potassium chloride SA (K-DUR) 20 MEQ tablet Take 1 tablet (20 mg total) by mouth every day Patient taking differently: Take 20 mEq by mouth 2 (two) times daily. Take 1 tablet (20 mg total) by mouth every day 09/05/18  Yes Galen Manila, NP  sertraline (ZOLOFT) 25 MG tablet Take 1 tablet (25 mg total) by mouth daily. 09/05/18  Yes  Galen Manila, NP  sotalol (BETAPACE) 80 MG tablet Take 40 mg by mouth 2 (two) times daily.  08/25/18 08/25/19 Yes [provider]  torsemide (DEMADEX) 20 MG tablet Take 20 mg by mouth 2 (two) times daily.   Yes [provider]  Vitamin D, Ergocalciferol, (DRISDOL) 1.25 MG (50000 UNIT) CAPS capsule Take 50,000 Units by mouth once a week. 04/25/19  Yes [provider]    Allergies Lisinopril  Family History  Problem Relation Age of Onset  . Heart attack Sister   . Hypertension Sister   . Dementia Sister   . Heart attack Son   . Dementia Sister     Social History Social History   Tobacco Use  . Smoking status: Never Smoker  . Smokeless tobacco: Never Used  Substance Use Topics  . Alcohol use: Yes    Alcohol/week: 2.0 standard drinks    Types: 2 Shots of liquor per week    Comment: occasional  . Drug use: No    Review of Systems  Review of Systems  Constitutional: Negative for chills and fever.  HENT: Positive for facial swelling, trouble swallowing and voice change. Negative for sore throat.   Respiratory: Negative for shortness of breath.   Cardiovascular: Negative for chest pain.  Gastrointestinal: Negative for abdominal pain.  Genitourinary: Negative for flank pain.  Musculoskeletal: Negative for neck pain.  Skin: Negative for rash and wound.  Allergic/Immunologic: Negative for immunocompromised state.  Neurological: Negative for weakness and numbness.  Hematological: Does not bruise/bleed easily.     ____________________________________________  PHYSICAL EXAM:      VITAL SIGNS: ED Triage Vitals  Enc Vitals Group     BP 07/28/19 1237 (!) 167/97     Pulse Rate 07/28/19 1237 (!) 55     Resp 07/28/19 1237 20     Temp 07/28/19 1416 98 F (36.7 C)     Temp Source 07/28/19 1416 Oral     SpO2 07/28/19 1237 100 %     Weight 07/28/19 1240 205 lb (93 kg)     Height 07/28/19 1240 5\' 2"  (1.575 m)     Head Circumference --      Peak  Flow --      Pain Score 07/28/19 1240 0     Pain Loc --      Pain Edu? --      Excl. in GC? --      Physical Exam Vitals and nursing note reviewed.  Constitutional:      General: She is not in acute distress.    Appearance: She is well-developed.  HENT:     Head: Normocephalic and atraumatic.     Comments: Moderate lower>upper lib edema. Mild mostly anterior tongue swelling. Posterior OP clear. Slight submandibular swelling.  Eyes:     Conjunctiva/sclera: Conjunctivae normal.  Neck:     Comments: No stridor Cardiovascular:     Rate and Rhythm: Normal rate and regular rhythm.  Heart sounds: Normal heart sounds. No murmur heard.  No friction rub.  Pulmonary:     Effort: Pulmonary effort is normal. No respiratory distress.     Breath sounds: Normal breath sounds. No wheezing or rales.  Abdominal:     General: There is no distension.     Palpations: Abdomen is soft.     Tenderness: There is no abdominal tenderness.  Musculoskeletal:     Cervical back: Neck supple.  Skin:    General: Skin is warm.     Capillary Refill: Capillary refill takes less than 2 seconds.  Neurological:     Mental Status: She is alert and oriented to person, place, and time.     Motor: No abnormal muscle tone.       ____________________________________________   LABS (all labs ordered are listed, but only abnormal results are displayed)  Labs Reviewed  BASIC METABOLIC PANEL - Abnormal; Notable for the following components:      Result Value   Creatinine, Ser 1.44 (*)    GFR calc non Af Amer 34 (*)    GFR calc Af Amer 39 (*)    All other components within normal limits  CBC - Abnormal; Notable for the following components:   Platelets 149 (*)    All other components within normal limits  BLOOD GAS, ARTERIAL - Abnormal; Notable for the following components:   pO2, Arterial 175 (*)    All other components within normal limits  SARS CORONAVIRUS 2 BY RT PCR (HOSPITAL ORDER, PERFORMED IN  [facility])  MRSA PCR SCREENING  TRIGLYCERIDES  CBC  BASIC METABOLIC PANEL  BLOOD GAS, ARTERIAL  TRIGLYCERIDES  PREPARE FRESH FROZEN PLASMA  ABO/RH  TYPE AND SCREEN    ____________________________________________  EKG:  ________________________________________  RADIOLOGY All imaging, including plain films, CT scans, and ultrasounds, independently reviewed by me, and interpretations confirmed via formal radiology reads.  ED MD interpretation:   CXR: ETT in appropriate position. NGT within gastric lumen.  Official radiology report(s): DG Chest Portable 1 View  Result Date: 07/28/2019 CLINICAL DATA:  Cyst post intubation.  Respiratory failure. EXAM: PORTABLE CHEST 1 VIEW COMPARISON:  08/13/2014 FINDINGS: Endotracheal tube is now present, 5.3 cm above the carina. Nasogastric tube extends into the upper abdomen just beyond the gastroesophageal junction with its proximal side hole positioned at the gastroesophageal junction. The lungs are symmetrically expanded. No pneumothorax or pleural effusion. Trace interstitial pulmonary edema has developed, likely cardiogenic in nature. Mild cardiomegaly is present new since prior examination. IMPRESSION: Endotracheal tube in appropriate position. Nasogastric tube chest within the gastric lumen. Advancement of 5-10 cm may more optimally position the catheter. Mild cardiogenic failure.  New mild cardiomegaly. Electronically Signed   By: Helyn NumbersAshesh  Parikh MD   On: 07/28/2019 15:45   DG Abd Portable 1 View  Result Date: 07/28/2019 CLINICAL DATA:  Post intubation EXAM: PORTABLE ABDOMEN - 1 VIEW COMPARISON:  None. FINDINGS: The bowel gas pattern is normal. Tip the NG tube is seen within the proximal stomach. No acute osseous. IMPRESSION: Tip the NG tube in the proximal stomach. Electronically Signed   By: Jonna ClarkBindu  Avutu M.D.   On: 07/28/2019 15:57    ____________________________________________  PROCEDURES   Procedure(s) performed  (including Critical Care):  .Critical Care Performed by: Shaune PollackIsaacs, Makenleigh Crownover, MD Authorized by: Shaune PollackIsaacs, Hayzlee Mcsorley, MD   Critical care provider statement:    Critical care time (minutes):  35   Critical care time was exclusive of:  Separately billable procedures and  treating other patients and teaching time   Critical care was necessary to treat or prevent imminent or life-threatening deterioration of the following conditions:  Cardiac failure and respiratory failure   Critical care was time spent personally by me on the following activities:  Development of treatment plan with patient or surrogate, discussions with consultants, evaluation of patient's response to treatment, examination of patient, obtaining history from patient or surrogate, ordering and performing treatments and interventions, ordering and review of laboratory studies, ordering and review of radiographic studies, pulse oximetry, re-evaluation of patient's condition and review of old charts   I assumed direction of critical care for this patient from another provider in my specialty: no   .1-3 Lead EKG Interpretation Performed by: Shaune Pollack, MD Authorized by: Shaune Pollack, MD     Interpretation: non-specific     ECG rate:  40-60s   ECG rate assessment: bradycardic     Rhythm: sinus bradycardia     Ectopy: none     Conduction: normal   Comments:     Indication: Intubated Procedure Name: Intubation Date/Time: 07/28/2019 6:23 PM Performed by: Shaune Pollack, MD Pre-anesthesia Checklist: Patient identified, Patient being monitored, Emergency Drugs available, Timeout performed and Suction available Oxygen Delivery Method: Non-rebreather mask Preoxygenation: Pre-oxygenation with 100% oxygen Induction Type: Rapid sequence Ventilation: Mask ventilation without difficulty Laryngoscope Size: Glidescope and 3 Grade View: Grade II Tube size: 7.0 mm Number of attempts: 1 Airway Equipment and Method: Video-laryngoscopy and  Rigid stylet Placement Confirmation: ETT inserted through vocal cords under direct vision,  CO2 detector and Breath sounds checked- equal and bilateral Secured at: 24 cm Tube secured with: ETT holder Dental Injury: Teeth and Oropharynx as per pre-operative assessment  Difficulty Due To: Difficulty was anticipated, Difficult Airway- due to large tongue, Difficult Airway- due to reduced neck mobility and Difficult Airway-  due to edematous airway Future Recommendations: Recommend- induction with short-acting agent, and alternative techniques readily available       ____________________________________________  INITIAL IMPRESSION / MDM / ASSESSMENT AND PLAN / ED COURSE  As part of my medical decision making, I reviewed the following data within the electronic MEDICAL RECORD NUMBER Nursing notes reviewed and incorporated, Old chart reviewed, Notes from prior ED visits, and [place] Controlled Substance Database       *SANTIA LABATE was evaluated in Emergency Department on 07/28/2019 for the symptoms described in the history of present illness. She was evaluated in the context of the global COVID-19 pandemic, which necessitated consideration that the patient might be at risk for infection with the SARS-CoV-2 virus that causes COVID-19. Institutional protocols and algorithms that pertain to the evaluation of patients at risk for COVID-19 are in a state of rapid change based on information released by regulatory bodies including the CDC and federal and state organizations. These policies and algorithms were followed during the patient's care in the ED.  Some ED evaluations and interventions may be delayed as a result of limited staffing during the pandemic.*     Medical Decision Making:  85 yo F here with suspected ACE-induced angioedema. No other allergic rxn symptoms or allergy exposures. On arrival, pt in no obv resp distress, and posterior tongue appears unaffected. Pt given IV steroids, benadryl,  pepcid, as well as FFP. No h/o autoimmune/hereditary angioedema.  Following tx, pt initially stable then worsening with severe worsening of entire tongue edema, anterior and now inferior neck fullness, hoarseness. Discussed with pt, family and decision made to intubate. Tolerated well. There was significant  tongue, oropharynx edema, as well as slight edema of the epiglottis. Cords appeared grossly normal. Admit to ICU.  ____________________________________________  FINAL CLINICAL IMPRESSION(S) / ED DIAGNOSES  Final diagnoses:  Acute respiratory failure with hypoxia (HCC)  Angiotensin converting enzyme inhibitor-aggravated angioedema, initial encounter     MEDICATIONS GIVEN DURING THIS VISIT:  Medications  0.9 %  sodium chloride infusion (Manually program via Guardrails IV Fluids) ( Intravenous Not Given 07/28/19 1704)  fentaNYL (SUBLIMAZE) injection 50 mcg (50 mcg Intravenous Given 07/28/19 1521)  fentaNYL (SUBLIMAZE) injection 50 mcg (has no administration in time range)  fentaNYL (SUBLIMAZE) injection 75 mcg (75 mcg Intravenous Not Given 07/28/19 1714)  fentaNYL (SUBLIMAZE) injection 50 mcg (50 mcg Intravenous Not Given 07/28/19 1713)  fentaNYL (SUBLIMAZE) bolus via infusion 25 mcg (has no administration in time range)  fentaNYL 10 mcg/ml infusion (  Not Given 07/28/19 1711)  sodium chloride flush (NS) 0.9 % injection 3 mL (3 mLs Intravenous Not Given 07/28/19 1707)  sodium chloride flush (NS) 0.9 % injection 3 mL (has no administration in time range)  0.9 %  sodium chloride infusion (has no administration in time range)  acetaminophen (TYLENOL) tablet 650 mg (has no administration in time range)  docusate sodium (COLACE) capsule 100 mg (has no administration in time range)  polyethylene glycol (MIRALAX / GLYCOLAX) packet 17 g (has no administration in time range)  ondansetron (ZOFRAN) injection 4 mg (has no administration in time range)  famotidine (PEPCID) IVPB 20 mg premix (has no  administration in time range)  enoxaparin (LOVENOX) injection 40 mg (40 mg Subcutaneous Given 07/28/19 1748)  dexamethasone (DECADRON) injection 4 mg (4 mg Intravenous Given 07/28/19 1749)  docusate (COLACE) 50 MG/5ML liquid 100 mg (100 mg Oral Not Given 07/28/19 1749)  polyethylene glycol (MIRALAX / GLYCOLAX) packet 17 g (17 g Oral Not Given 07/28/19 1709)  propofol (DIPRIVAN) 1000 MG/100ML infusion (50 mcg/kg/min  93 kg Intravenous New Bag/Given 07/28/19 1747)  fentaNYL (SUBLIMAZE) injection 25 mcg (25 mcg Intravenous Not Given 07/28/19 1713)  fentaNYL in NS (51mcg/ml) infusion-PREMIX (200 mcg/hr Intravenous New Bag/Given 07/28/19 1748)  fentaNYL (SUBLIMAZE) bolus via infusion 25 mcg (has no administration in time range)  HYDROmorphone (DILAUDID) injection 1 mg (1 mg Intravenous Not Given 07/28/19 1713)  LORazepam (ATIVAN) injection 2-4 mg (2 mg Intravenous Given 07/28/19 1809)  hydrALAZINE (APRESOLINE) injection 10 mg (10 mg Intravenous Given 07/28/19 1809)  hydrALAZINE (APRESOLINE) 20 MG/ML injection (  Not Given 07/28/19 1813)  diphenhydrAMINE (BENADRYL) injection 25 mg (25 mg Intravenous Given 07/28/19 1243)  methylPREDNISolone sodium succinate (SOLU-MEDROL) 125 mg/2 mL injection 125 mg (125 mg Intravenous Given 07/28/19 1244)  famotidine (PEPCID) IVPB 20 mg premix (0 mg Intravenous Stopped 07/28/19 1254)  etomidate (AMIDATE) injection 30 mg (30 mg Intravenous Given 07/28/19 1505)  succinylcholine (ANECTINE) injection 150 mg (150 mg Intravenous Given 07/28/19 1506)     ED Discharge Orders    None       Note:  This document was prepared using Dragon voice recognition software and may include unintentional dictation errors.   Shaune Pollack, MD 07/28/19 [place]

## 2019-07-28 NOTE — ED Notes (Signed)
Pt tongue continues to swell. Daughter remains at the bedside.

## 2019-07-28 NOTE — Progress Notes (Signed)
NP notified of patients BP elevated 190/61, PRN hydralazine IV 10mg  q 6 hours is ordered and last dose given at 1809. Verbal order received to give another dose of Hydralazine IV 10mg  once now.

## 2019-07-28 NOTE — ED Notes (Signed)
Pt difficult to sedate. MD aware pt is past the max dose of prop at . New order for Fentanyl drip given.

## 2019-07-28 NOTE — ED Notes (Signed)
Daughter back at the bedside and explained pt status.

## 2019-07-28 NOTE — Progress Notes (Addendum)
Bear Hugger placed on patient for foley temp of 95.7 F, will continue to monitor.

## 2019-07-28 NOTE — ED Notes (Signed)
Pt attempting to extubate herself. This Rn and Dr Cyril Loosen to the bedside. Propofol increased to .

## 2019-07-28 NOTE — ED Triage Notes (Signed)
PT arrived via POV with c/o lip swelling that started yesterday worse today and tongue swelling. PT also reports some shortness of breath and difficulty breathing, pt having difficulty speaking due to tongue swelling.  Pt taking Lisinopril but has been taking it for a long time. NO other new meds.

## 2019-07-28 NOTE — Progress Notes (Signed)
Pt arrived on unit at this time. Daughter is present with patient. Pt responds to pain, on fentanyl and propofol gtt. Tongue continues to swell and Dr. Belia Heman has assessed. BP is elevated. Bradycardic on monitor. Safety mitts placed on patient at this time. Foley and OGT in place. RT at bedside assessing vent settings.

## 2019-07-28 NOTE — H&P (Signed)
Name: Melanie Lewis MRN: [id number] DOB: 1934/02/16     CONSULTATION DATE: 07/28/2019  REFERRING MD : Erma Heritage  CHIEF COMPLAINT: angioedema   HISTORY OF PRESENT ILLNESS:   85 yo AAF with  with c/o lip swelling that started yesterday worse today and tongue swelling.   PT also reports some shortness of breath and difficulty breathing, pt having difficulty speaking due to tongue swelling.  Pt taking Lisinopril but has been taking it for a long time. NO other new meds.  Patient progressively worsened with increased WOB and swelling Patient was emergently itnubated    PAST MEDICAL HISTORY :   has a past medical history of CHF (congestive heart failure) (HCC), Coronary artery disease, Dementia (HCC), Heart attack (HCC), Hypertension, hyperthyroid, Hyperthyroidism, and MI (myocardial infarction) (HCC).  has a past surgical history that includes Partial hysterectomy; Cholecystectomy; and Cardiac catheterization (N/A, 08/14/2014). Prior to Admission medications   Medication Sig Start Date End Date Taking? Authorizing Provider  amiodarone (PACERONE) 200 MG tablet Take 100 mg by mouth daily. 04/28/19  Yes [provider]  aspirin EC 81 MG tablet Take 81 mg by mouth daily.   Yes [provider]  atorvastatin (LIPITOR) 10 MG tablet Take 1 tablet (10 mg total) by mouth daily. 09/05/18  Yes Galen Manila, NP  Biotin 1 MG CAPS Take by mouth.   Yes [provider]  Calcium Carb-Cholecalciferol (CALCIUM 1000 + D PO) Calcium 600 mg + vit d 800 iu- 1 tab daily   Yes [provider]  carvedilol (COREG) 12.5 MG tablet Take 12.5 mg by mouth 2 (two) times daily with a meal.   Yes [provider]  cloNIDine (CATAPRES) 0.1 MG tablet Take by mouth 2 (two) times daily.  07/25/18 07/28/19 Yes [provider]  donepezil (ARICEPT) 10 MG tablet Take 1 tablet (10 mg total) by mouth at bedtime. 09/05/18  Yes Galen Manila, NP  hydrALAZINE  (APRESOLINE) 25 MG tablet Take 25 mg by mouth 2 (two) times daily. 06/24/19  Yes [provider]  lisinopril (ZESTRIL) 20 MG tablet TAKE 1 TABLET BY MOUTH EVERY DAY 01/20/19  Yes Karamalegos, Netta Neat, DO  memantine (NAMENDA) 5 MG tablet Take 5 mg by mouth 2 (two) times daily. 07/19/19  Yes [provider]  methimazole (TAPAZOLE) 5 MG tablet Take 0.5 tablets by mouth daily.  04/13/18  Yes [provider]  potassium chloride SA (K-DUR) 20 MEQ tablet Take 1 tablet (20 mg total) by mouth every day Patient taking differently: Take 20 mEq by mouth 2 (two) times daily. Take 1 tablet (20 mg total) by mouth every day 09/05/18  Yes Galen Manila, NP  sertraline (ZOLOFT) 25 MG tablet Take 1 tablet (25 mg total) by mouth daily. 09/05/18  Yes Galen Manila, NP  sotalol (BETAPACE) 80 MG tablet Take 40 mg by mouth 2 (two) times daily.  08/25/18 08/25/19 Yes [provider]  torsemide (DEMADEX) 20 MG tablet Take 20 mg by mouth 2 (two) times daily.   Yes [provider]  Vitamin D, Ergocalciferol, (DRISDOL) 1.25 MG (50000 UNIT) CAPS capsule Take 50,000 Units by mouth once a week. 04/25/19  Yes [provider]  Cholecalciferol (VITAMIN D3) 1000 units CAPS Take by mouth.    [provider]  omeprazole (PRILOSEC) 40 MG capsule Take 40 mg by mouth daily.    [provider]   Allergies  Allergen Reactions   Lisinopril Swelling    FAMILY HISTORY:  family history includes Dementia in her sister and sister; Heart attack in her sister and son; Hypertension in her sister. SOCIAL HISTORY:  reports that she has never smoked. She has never used smokeless tobacco. She reports current alcohol use of about 2.0 standard drinks of alcohol per week. She reports that she does not use drugs.  REVIEW OF SYSTEMS:   Unable to obtain due to critical illness      Estimated body mass index is 37.49 kg/m as calculated from the following:   Height as  of this encounter: 5\' 2"  (1.575 m).   Weight as of this encounter: 93 kg.    VITAL SIGNS: Temp:  [98 F (36.7 C)] 98 F (36.7 C) (07/16 1416) Pulse Rate:  [49-99] 99 (07/16 1515) Resp:  [13-23] 23 (07/16 1515) BP: (167-239)/(56-140) 239/140 (07/16 1515) SpO2:  [99 %-100 %] 100 % (07/16 1515) FiO2 (%):  [35 %-50 %] 35 % (07/16 1555) Weight:  [93 kg] 93 kg (07/16 1240)   No intake/output data recorded. Total I/O In: 3.1 [I.V.:3.1] Out: -    SpO2: 100 % FiO2 (%): 35 %   Physical Examination:  GENERAL:critically ill appearing, +resp distress HEAD: Normocephalic, atraumatic.  EYES: Pupils equal, round, reactive to light.  No scleral icterus.  MOUTH: Moist mucosal membrane.lips and tongue swelling NECK: Supple. No JVD.  PULMONARY: +rhonchi, +wheezing CARDIOVASCULAR: S1 and S2. Regular rate and rhythm. No murmurs, rubs, or gallops.  GASTROINTESTINAL: Soft, nontender, -distended.  Positive bowel sounds.  MUSCULOSKELETAL: No swelling, clubbing, or edema.  NEUROLOGIC: obtunded SKIN:intact,warm,dry   MEDICATIONS: I have reviewed all medications and confirmed regimen as documented   CULTURE RESULTS   No results found for this or any previous visit (from the past 240 hour(s)).     Media Information   Document Information  Photos  Severe Angioedema  07/28/2019 18:07  Attached To:  Hospital Encounter on 07/28/19  Source Information  07/30/19, MD   [facility]      IMAGING    DG Chest Portable 1 View  Result Date: 07/28/2019 CLINICAL DATA:  Cyst post intubation.  Respiratory failure. EXAM: PORTABLE CHEST 1 VIEW COMPARISON:  08/13/2014 FINDINGS: Endotracheal tube is now present, 5.3 cm above the carina. Nasogastric tube extends into the upper abdomen just beyond the gastroesophageal junction with its proximal side hole positioned at the gastroesophageal junction. The lungs are symmetrically expanded. No pneumothorax or pleural effusion. Trace interstitial  pulmonary edema has developed, likely cardiogenic in nature. Mild cardiomegaly is present new since prior examination. IMPRESSION: Endotracheal tube in appropriate position. Nasogastric tube chest within the gastric lumen. Advancement of 5-10 cm may more optimally position the catheter. Mild cardiogenic failure.  New mild cardiomegaly. Electronically Signed   By: 10/13/2014 MD   On: 07/28/2019 15:45   DG Abd Portable 1 View  Result Date: 07/28/2019 CLINICAL DATA:  Post intubation EXAM: PORTABLE ABDOMEN - 1 VIEW COMPARISON:  None. FINDINGS: The bowel gas pattern is normal. Tip the NG tube is seen within the proximal stomach. No acute osseous. IMPRESSION: Tip the NG tube in the proximal stomach. Electronically Signed   By: 07/30/2019 M.D.   On: 07/28/2019 15:57     Nutrition Status:         BMP Latest Ref Rng & Units 07/28/2019 04/16/2017 09/18/2014  Glucose 70 - 99 mg/dL 93 89 88  BUN 8 - 23 mg/dL 13 18 11   Creatinine 0.44 - 1.00 mg/dL 11/18/2014) ) 3.81(O)  BUN/Creat Ratio 6 -  22 (calc) - 14 11  Sodium 135 - 145 mmol/L 141 141 142  Potassium 3.5 - 5.1 mmol/L 3.8 4.0 4.5  Chloride 98 - 111 mmol/L 106 106 102  CO2 22 - 32 mmol/L 26 27 23   Calcium 8.9 - 10.3 mg/dL 9.4 9.5 9.4     Indwelling Urinary Catheter continued, requirement due to   Reason to continue Indwelling Urinary Catheter strict Intake/Output monitoring for hemodynamic instability         Ventilator continued, requirement due to severe respiratory failure   Ventilator Sedation RASS 0 to -2      ASSESSMENT AND PLAN SYNOPSIS    Severe ACUTE Hypoxic and Hypercapnic Respiratory Failure from acute angioedema leading to severe airway obstruction -continue Full MV support -continue Bronchodilator Therapy -Wean Fio2 and PEEP as tolerated -VAP/VENT bundle implementation IV steroids   ACUTE KIDNEY INJURY/Renal Failure -continue Foley Catheter-assess need -Avoid nephrotoxic agents -Follow urine output,  BMP -Ensure adequate renal perfusion, optimize oxygenation -Renal dose medications     NEUROLOGY - intubated and sedated - minimal sedation to achieve a RASS goal: -1   CARDIAC ICU monitoring   GI GI PROPHYLAXIS as indicated   ENDO - will use ICU hypoglycemic\Hyperglycemia protocol if needed    ELECTROLYTES -follow labs as needed -replace as needed -pharmacy consultation and following    DVT/GI PRX ordered and assessed TRANSFUSIONS AS NEEDED MONITOR FSBS I Assessed the need for Labs I Assessed the need for Foley I Assessed the need for Central Venous Line Family Discussion when available I Assessed the need for Mobilization I made an Assessment of medications to be adjusted accordingly Safety Risk assessment Completed   Critical Care Time devoted to patient care services described in this note is 45 minutes.   Overall, patient is critically ill, prognosis is guarded.  Patient with Multiorgan failure and at high risk for cardiac arrest and death.    , M.D.  [facility]  Medical Director [facility] Medical Director [facility] Cardio-Pulmonary Department

## 2019-07-28 NOTE — Progress Notes (Addendum)
Warm blankets applied to patient for foley temp 95.9.

## 2019-07-28 NOTE — ED Notes (Signed)
Attempt to call report unsuccessful. Consulting civil engineer notified.

## 2019-07-29 ENCOUNTER — Inpatient Hospital Stay: Payer: Medicare Other

## 2019-07-29 LAB — TSH: TSH: 0.012 u[IU]/mL — ABNORMAL LOW (ref 0.350–4.500)

## 2019-07-29 LAB — BPAM FFP
Blood Product Expiration Date: 202107172359
ISSUE DATE / TIME: 202107161411
Unit Type and Rh: 1700

## 2019-07-29 LAB — PREPARE FRESH FROZEN PLASMA: Unit division: 0

## 2019-07-29 LAB — BLOOD GAS, ARTERIAL
Acid-base deficit: 1.7 mmol/L (ref 0.0–2.0)
Bicarbonate: 21.5 mmol/L (ref 20.0–28.0)
FIO2: 0.35
MECHVT: 500 mL
O2 Saturation: 98.9 %
Patient temperature: 37
RATE: 15 resp/min
pCO2 arterial: 31 mmHg — ABNORMAL LOW (ref 32.0–48.0)
pH, Arterial: 7.45 (ref 7.350–7.450)
pO2, Arterial: 124 mmHg — ABNORMAL HIGH (ref 83.0–108.0)

## 2019-07-29 LAB — CBC
HCT: 37.1 % (ref 36.0–46.0)
Hemoglobin: 12.5 g/dL (ref 12.0–15.0)
MCH: 28.9 pg (ref 26.0–34.0)
MCHC: 33.7 g/dL (ref 30.0–36.0)
MCV: 85.7 fL (ref 80.0–100.0)
Platelets: 136 10*3/uL — ABNORMAL LOW (ref 150–400)
RBC: 4.33 MIL/uL (ref 3.87–5.11)
RDW: 13.3 % (ref 11.5–15.5)
WBC: 5.9 10*3/uL (ref 4.0–10.5)
nRBC: 0 % (ref 0.0–0.2)

## 2019-07-29 LAB — BASIC METABOLIC PANEL
Anion gap: 13 (ref 5–15)
BUN: 17 mg/dL (ref 8–23)
CO2: 20 mmol/L — ABNORMAL LOW (ref 22–32)
Calcium: 9.3 mg/dL (ref 8.9–10.3)
Chloride: 106 mmol/L (ref 98–111)
Creatinine, Ser: 1.44 mg/dL — ABNORMAL HIGH (ref 0.44–1.00)
GFR calc Af Amer: 39 mL/min — ABNORMAL LOW (ref >60)
GFR calc non Af Amer: 34 mL/min — ABNORMAL LOW (ref >60)
Glucose, Bld: 145 mg/dL — ABNORMAL HIGH (ref 70–99)
Potassium: 3.7 mmol/L (ref 3.5–5.1)
Sodium: 139 mmol/L (ref 135–145)

## 2019-07-29 LAB — MAGNESIUM: Magnesium: 1.8 mg/dL (ref 1.7–2.4)

## 2019-07-29 LAB — PHOSPHORUS: Phosphorus: 3.3 mg/dL (ref 2.5–4.6)

## 2019-07-29 LAB — TRIGLYCERIDES: Triglycerides: 98 mg/dL (ref <150)

## 2019-07-29 MED ORDER — CHLORHEXIDINE GLUCONATE CLOTH 2 % EX PADS
6.0000 | MEDICATED_PAD | Freq: Every day | CUTANEOUS | Status: DC
  Administered 2019-07-29 – 2019-09-04 (×36): 6 via TOPICAL

## 2019-07-29 MED ORDER — DIPHENHYDRAMINE HCL 50 MG/ML IJ SOLN
25.0000 mg | Freq: Three times a day (TID) | INTRAMUSCULAR | Status: AC
  Administered 2019-07-29 (×3): 25 mg via INTRAVENOUS
  Filled 2019-07-29 (×3): qty 1

## 2019-07-29 MED ORDER — CARVEDILOL 12.5 MG PO TABS
12.5000 mg | ORAL_TABLET | Freq: Two times a day (BID) | ORAL | Status: DC
  Filled 2019-07-29: qty 1

## 2019-07-29 MED ORDER — HYDRALAZINE HCL 20 MG/ML IJ SOLN
10.0000 mg | INTRAMUSCULAR | Status: DC | PRN
  Administered 2019-07-29 – 2019-08-27 (×30): 10 mg via INTRAVENOUS
  Filled 2019-07-29 (×33): qty 1

## 2019-07-29 MED ORDER — HYDRALAZINE HCL 10 MG PO TABS
10.0000 mg | ORAL_TABLET | Freq: Three times a day (TID) | ORAL | Status: DC
  Administered 2019-07-29 (×3): 10 mg via ORAL
  Filled 2019-07-29 (×5): qty 1

## 2019-07-29 MED ORDER — CLONIDINE HCL 0.1 MG/24HR TD PTWK
0.1000 mg | MEDICATED_PATCH | TRANSDERMAL | Status: DC
  Administered 2019-07-29: 0.1 mg via TRANSDERMAL
  Filled 2019-07-29: qty 1

## 2019-07-29 MED ORDER — METHIMAZOLE 5 MG PO TABS
2.5000 mg | ORAL_TABLET | Freq: Every day | ORAL | Status: DC
  Administered 2019-07-29 – 2019-08-03 (×6): 2.5 mg
  Filled 2019-07-29 (×6): qty 1

## 2019-07-29 NOTE — Progress Notes (Signed)
Coreg held this morning due to patients HR 40-50's.  NP notified.

## 2019-07-29 NOTE — Progress Notes (Signed)
bair hugger removed 

## 2019-07-29 NOTE — Progress Notes (Signed)
bair hugger applied.

## 2019-07-29 NOTE — Progress Notes (Signed)
  OG tube advanced 10cm to 60cm per recommendations of CXR from 07-28-19 from previous reading of 50cm.  OG remains hooked up to low-intermittent suction.

## 2019-07-29 NOTE — Progress Notes (Signed)
PRN Hydralazine IV given for elevated BP 184/67. Will continue to monitor.

## 2019-07-29 NOTE — Progress Notes (Signed)
CRITICAL CARE NOTE  7/16 admitted to ICU for severe angioedema 7/17 severe angioedema, resp failure   CC  follow up respiratory failure  SUBJECTIVE Patient remains critically ill Prognosis is guarded   BP (!) 167/55   Pulse (!) 44   Temp 98.8 F (37.1 C)   Resp 17   Ht 5' 2.01" (1.575 m)   Wt 89.7 kg   LMP  (LMP Unknown)   SpO2 99%   BMI 36.16 kg/m    I/O last 3 completed shifts: In: 681.6 [I.V.:681.6] Out: 775 [Urine:775] No intake/output data recorded.  SpO2: 99 % FiO2 (%): 35 %  Estimated body mass index is 36.16 kg/m as calculated from the following:   Height as of this encounter: 5' 2.01" (1.575 m).   Weight as of this encounter: 89.7 kg.  SIGNIFICANT EVENTS   REVIEW OF SYSTEMS  PATIENT IS UNABLE TO PROVIDE COMPLETE REVIEW OF SYSTEMS DUE TO SEVERE CRITICAL ILLNESS        PHYSICAL EXAMINATION:  GENERAL:critically ill appearing, +resp distress HEAD: Normocephalic, atraumatic.  EYES: Pupils equal, round, reactive to light.  No scleral icterus.  MOUTH: severe tongue and lips swelling NECK: Supple.  PULMONARY: +rhonchi, +wheezing CARDIOVASCULAR: S1 and S2. Regular rate and rhythm. No murmurs, rubs, or gallops.  GASTROINTESTINAL: Soft, nontender, -distended.  Positive bowel sounds.   MUSCULOSKELETAL: No swelling, clubbing, or edema.  NEUROLOGIC: obtunded, GCS<8 SKIN:intact,warm,dry  MEDICATIONS: I have reviewed all medications and confirmed regimen as documented   CULTURE RESULTS   Recent Results (from the past 240 hour(s))  SARS Coronavirus 2 by RT PCR (hospital order, performed in [facility] hospital lab) Nasopharyngeal Nasopharyngeal Swab     Status: None   Collection Time: 07/28/19  3:43 PM   Specimen: Nasopharyngeal Swab  Result Value Ref Range Status   SARS Coronavirus 2 NEGATIVE NEGATIVE Final    Comment: (NOTE) SARS-CoV-2 target nucleic acids are NOT DETECTED.  The SARS-CoV-2 RNA is generally detectable in upper and  lower respiratory specimens during the acute phase of infection. The lowest concentration of SARS-CoV-2 viral copies this assay can detect is 250 copies / mL. A negative result does not preclude SARS-CoV-2 infection and should not be used as the sole basis for treatment or other patient management decisions.  A negative result may occur with improper specimen collection / handling, submission of specimen other than nasopharyngeal swab, presence of viral mutation(s) within the areas targeted by this assay, and inadequate number of viral copies (<250 copies / mL). A negative result must be combined with clinical observations, patient history, and epidemiological information.  Fact Sheet for Patients:   BoilerBrush.com.cy  Fact Sheet for Healthcare Providers: https://pope.com/  This test is not yet approved or  cleared by the [place] FDA and has been authorized for detection and/or diagnosis of SARS-CoV-2 by FDA under an Emergency Use Authorization (EUA).  This EUA will remain in effect (meaning this test can be used) for the duration of the COVID-19 declaration under Section 564(b)(1) of the Act, 21 U.S.C. section 360bbb-3(b)(1), unless the authorization is terminated or revoked sooner.  Performed at [facility], [address] [place], [place] [address]   MRSA PCR Screening     Status: None   Collection Time: 07/28/19  5:46 PM   Specimen: Nasal Mucosa; Nasopharyngeal  Result Value Ref Range Status   MRSA by PCR NEGATIVE NEGATIVE Final    Comment:        The GeneXpert MRSA Assay (FDA approved for NASAL  specimens only), is one component of a comprehensive MRSA colonization surveillance program. It is not intended to diagnose MRSA infection nor to guide or monitor treatment for MRSA infections. Performed at [facility], [address] [place], [place] [address]           IMAGING    DG Chest  Portable 1 View  Result Date: 07/28/2019 CLINICAL DATA:  Cyst post intubation.  Respiratory failure. EXAM: PORTABLE CHEST 1 VIEW COMPARISON:  08/13/2014 FINDINGS: Endotracheal tube is now present, 5.3 cm above the carina. Nasogastric tube extends into the upper abdomen just beyond the gastroesophageal junction with its proximal side hole positioned at the gastroesophageal junction. The lungs are symmetrically expanded. No pneumothorax or pleural effusion. Trace interstitial pulmonary edema has developed, likely cardiogenic in nature. Mild cardiomegaly is present new since prior examination. IMPRESSION: Endotracheal tube in appropriate position. Nasogastric tube chest within the gastric lumen. Advancement of 5-10 cm may more optimally position the catheter. Mild cardiogenic failure.  New mild cardiomegaly. Electronically Signed   By: Helyn Numbers MD   On: 07/28/2019 15:45   DG Abd Portable 1 View  Result Date: 07/28/2019 CLINICAL DATA:  Post intubation EXAM: PORTABLE ABDOMEN - 1 VIEW COMPARISON:  None. FINDINGS: The bowel gas pattern is normal. Tip the NG tube is seen within the proximal stomach. No acute osseous. IMPRESSION: Tip the NG tube in the proximal stomach. Electronically Signed   By: Jonna Clark M.D.   On: 07/28/2019 15:57     Nutrition Status:        BMP Latest Ref Rng & Units 07/29/2019 07/28/2019 04/16/2017  Glucose 70 - 99 mg/dL 144(Y) 93 89  BUN 8 - 23 mg/dL 17 13 18   Creatinine 0.44 - 1.00 mg/dL ) 1.85(U) 3.14(H)  BUN/Creat Ratio 6 - 22 (calc) - - 14  Sodium 135 - 145 mmol/L 139 141 141  Potassium 3.5 - 5.1 mmol/L 3.7 3.8 4.0  Chloride 98 - 111 mmol/L 106 106 106  CO2 22 - 32 mmol/L 20(L) 26 27  Calcium 8.9 - 10.3 mg/dL 9.3 9.4 9.5      Indwelling Urinary Catheter continued, requirement due to   Reason to continue Indwelling Urinary Catheter strict Intake/Output monitoring for hemodynamic instability         Ventilator continued, requirement due to severe  respiratory failure   Ventilator Sedation RASS 0 to -2      ASSESSMENT AND PLAN SYNOPSIS   Severe ACUTE Hypoxic and Hypercapnic Respiratory Failure from severe angioedema dn severe airway obstruction -continue Full MV support -continue Bronchodilator Therapy -Wean Fio2 and PEEP as tolerated -VAP/VENT bundle implementation  ACUTE KIDNEY INJURY/Renal Failure -continue Foley Catheter-assess need -Avoid nephrotoxic agents -Follow urine output, BMP -Ensure adequate renal perfusion, optimize oxygenation -Renal dose medications    NEUROLOGY - intubated and sedated - minimal sedation to achieve a RASS goal: -1   CARDIAC ICU monitoring  ID -continue IV abx as prescibed -follow up cultures  GI GI PROPHYLAXIS as indicated  NUTRITIONAL STATUS Nutrition Status:    DIET-->TF's as tolerated Constipation protocol as indicated  ENDO - will use ICU hypoglycemic\Hyperglycemia protocol if indicated     ELECTROLYTES -follow labs as needed -replace as needed -pharmacy consultation and following   DVT/GI PRX ordered and assessed TRANSFUSIONS AS NEEDED MONITOR FSBS I Assessed the need for Labs I Assessed the need for Foley I Assessed the need for Central Venous Line Family Discussion when available I Assessed the need for Mobilization I made an  Assessment of medications to be adjusted accordingly Safety Risk assessment completed    Critical Care Time devoted to patient care services described in this note is 32 minutes.   Overall, patient is critically ill, prognosis is guarded.   Lucie Leather, M.D.  [facility]  Medical Director [facility] Medical Director [facility] Cardio-Pulmonary Department

## 2019-07-29 NOTE — Progress Notes (Signed)
NP notified of elevated BP 182/52 HR 51. See new orders.

## 2019-07-29 NOTE — Progress Notes (Signed)
Bair hugger placed on ambient setting at this time.

## 2019-07-30 ENCOUNTER — Inpatient Hospital Stay: Payer: Medicare Other

## 2019-07-30 LAB — BASIC METABOLIC PANEL
Anion gap: 9 (ref 5–15)
BUN: 23 mg/dL (ref 8–23)
CO2: 22 mmol/L (ref 22–32)
Calcium: 8.4 mg/dL — ABNORMAL LOW (ref 8.9–10.3)
Chloride: 106 mmol/L (ref 98–111)
Creatinine, Ser: 1.64 mg/dL — ABNORMAL HIGH (ref 0.44–1.00)
GFR calc Af Amer: 33 mL/min — ABNORMAL LOW (ref >60)
GFR calc non Af Amer: 29 mL/min — ABNORMAL LOW (ref >60)
Glucose, Bld: 154 mg/dL — ABNORMAL HIGH (ref 70–99)
Potassium: 3.4 mmol/L — ABNORMAL LOW (ref 3.5–5.1)
Sodium: 137 mmol/L (ref 135–145)

## 2019-07-30 LAB — CBC
HCT: 34.8 % — ABNORMAL LOW (ref 36.0–46.0)
Hemoglobin: 12.3 g/dL (ref 12.0–15.0)
MCH: 29.8 pg (ref 26.0–34.0)
MCHC: 35.3 g/dL (ref 30.0–36.0)
MCV: 84.3 fL (ref 80.0–100.0)
Platelets: 131 10*3/uL — ABNORMAL LOW (ref 150–400)
RBC: 4.13 MIL/uL (ref 3.87–5.11)
RDW: 13.2 % (ref 11.5–15.5)
WBC: 12.5 10*3/uL — ABNORMAL HIGH (ref 4.0–10.5)
nRBC: 0 % (ref 0.0–0.2)

## 2019-07-30 LAB — MAGNESIUM: Magnesium: 1.9 mg/dL (ref 1.7–2.4)

## 2019-07-30 LAB — PHOSPHORUS: Phosphorus: 3.7 mg/dL (ref 2.5–4.6)

## 2019-07-30 LAB — TRIGLYCERIDES: Triglycerides: 104 mg/dL (ref <150)

## 2019-07-30 MED ORDER — ENOXAPARIN SODIUM 30 MG/0.3ML ~~LOC~~ SOLN
30.0000 mg | SUBCUTANEOUS | Status: DC
  Administered 2019-07-30: 30 mg via SUBCUTANEOUS
  Filled 2019-07-30: qty 0.3

## 2019-07-30 MED ORDER — HYDRALAZINE HCL 50 MG PO TABS
25.0000 mg | ORAL_TABLET | Freq: Three times a day (TID) | ORAL | Status: DC
  Administered 2019-07-30 – 2019-08-07 (×25): 25 mg via ORAL
  Filled 2019-07-30: qty 0.5
  Filled 2019-07-30 (×2): qty 1
  Filled 2019-07-30: qty 0.5
  Filled 2019-07-30 (×6): qty 1
  Filled 2019-07-30: qty 0.5
  Filled 2019-07-30 (×10): qty 1
  Filled 2019-07-30: qty 0.5
  Filled 2019-07-30 (×5): qty 1

## 2019-07-30 NOTE — Progress Notes (Signed)
Patient just went from a steady heart rate of 49-55 into sinus tachycardia in the 110s.  This lasted about 30 seconds and then patient returned back to sinus bradycardia.  Dr. Belia Heman notified.  Patient has also been hypertensive throughout the shift and has been receiving routinely scheduled hydralazine as well as PRN hydralazine every 4 hours.  These two in conjunction are maintaining a systolic blood pressure around 170.  Dr. Belia Heman notified

## 2019-07-30 NOTE — Progress Notes (Signed)
CRITICAL CARE NOTE 7/16 admitted to ICU for severe angioedema 7/17 severe angioedema, resp failure 7/18 severe angioedema, resp failure     CC  follow up respiratory failure  SUBJECTIVE Patient remains critically ill Prognosis is guarded Increased BP   BP (!) 184/66   Pulse (!) 49   Temp 97.9 F (36.6 C)   Resp 15   Ht 5' 2.01" (1.575 m)   Wt 88.8 kg   LMP  (LMP Unknown)   SpO2 99%   BMI 35.80 kg/m    I/O last 3 completed shifts: In: 1824.4 [I.V.:1694.4; NG/GT:90; IV Piggyback:40] Out: 1300 [Urine:1125; Emesis/NG output:175] No intake/output data recorded.  SpO2: 99 % FiO2 (%): 28 %  Estimated body mass index is 35.8 kg/m as calculated from the following:   Height as of this encounter: 5' 2.01" (1.575 m).   Weight as of this encounter: 88.8 kg.  SIGNIFICANT EVENTS   REVIEW OF SYSTEMS  PATIENT IS UNABLE TO PROVIDE COMPLETE REVIEW OF SYSTEMS DUE TO SEVERE CRITICAL ILLNESS        PHYSICAL EXAMINATION:  GENERAL:critically ill appearing, +resp distress HEAD: Normocephalic, atraumatic.  EYES: Pupils equal, round, reactive to light.  No scleral icterus.  MOUTH: tongue and lip swelling NECK: Supple.  PULMONARY: +rhonchi, +wheezing CARDIOVASCULAR: S1 and S2. Regular rate and rhythm. No murmurs, rubs, or gallops.  GASTROINTESTINAL: Soft, nontender, -distended.  Positive bowel sounds.   MUSCULOSKELETAL: No swelling, clubbing, or edema.  NEUROLOGIC: obtunded, GCS<8 SKIN:intact,warm,dry  MEDICATIONS: I have reviewed all medications and confirmed regimen as documented   CULTURE RESULTS   Recent Results (from the past 240 hour(s))  SARS Coronavirus 2 by RT PCR (hospital order, performed in [facility] hospital lab) Nasopharyngeal Nasopharyngeal Swab     Status: None   Collection Time: 07/28/19  3:43 PM   Specimen: Nasopharyngeal Swab  Result Value Ref Range Status   SARS Coronavirus 2 NEGATIVE NEGATIVE Final    Comment: (NOTE) SARS-CoV-2 target  nucleic acids are NOT DETECTED.  The SARS-CoV-2 RNA is generally detectable in upper and lower respiratory specimens during the acute phase of infection. The lowest concentration of SARS-CoV-2 viral copies this assay can detect is 250 copies / mL. A negative result does not preclude SARS-CoV-2 infection and should not be used as the sole basis for treatment or other patient management decisions.  A negative result may occur with improper specimen collection / handling, submission of specimen other than nasopharyngeal swab, presence of viral mutation(s) within the areas targeted by this assay, and inadequate number of viral copies (<250 copies / mL). A negative result must be combined with clinical observations, patient history, and epidemiological information.  Fact Sheet for Patients:   BoilerBrush.com.cy  Fact Sheet for Healthcare Providers: https://pope.com/  This test is not yet approved or  cleared by the [place] FDA and has been authorized for detection and/or diagnosis of SARS-CoV-2 by FDA under an Emergency Use Authorization (EUA).  This EUA will remain in effect (meaning this test can be used) for the duration of the COVID-19 declaration under Section 564(b)(1) of the Act, 21 U.S.C. section 360bbb-3(b)(1), unless the authorization is terminated or revoked sooner.  Performed at [facility], [address] [place], [place] [address]   MRSA PCR Screening     Status: None   Collection Time: 07/28/19  5:46 PM   Specimen: Nasal Mucosa; Nasopharyngeal  Result Value Ref Range Status   MRSA by PCR NEGATIVE NEGATIVE Final    Comment:  The GeneXpert MRSA Assay (FDA approved for NASAL specimens only), is one component of a comprehensive MRSA colonization surveillance program. It is not intended to diagnose MRSA infection nor to guide or monitor treatment for MRSA infections. Performed at [facility], [address] [place], [place] [address]         BMP Latest Ref Rng & Units 07/30/2019 07/29/2019 07/28/2019  Glucose 70 - 99 mg/dL 010(X) 323(F) 93  BUN 8 - 23 mg/dL 23 17 13   Creatinine 0.44 - 1.00 mg/dL ) 5.73(U) 2.02(R)  BUN/Creat Ratio 6 - 22 (calc) - - -  Sodium 135 - 145 mmol/L 137 139 141  Potassium 3.5 - 5.1 mmol/L 3.4(L) 3.7 3.8  Chloride 98 - 111 mmol/L 106 106 106  CO2 22 - 32 mmol/L 22 20(L) 26  Calcium 8.9 - 10.3 mg/dL 4.27(C) 9.3 9.4       Indwelling Urinary Catheter continued, requirement due to   Reason to continue Indwelling Urinary Catheter strict Intake/Output monitoring for hemodynamic instability         Ventilator continued, requirement due to severe respiratory failure   Ventilator Sedation RASS 0 to -2      ASSESSMENT AND PLAN SYNOPSIS Severe ACUTE Hypoxic and Hypercapnic Respiratory Failure from severe angioedema dn severe airway obstruction  Severe ACUTE Hypoxic and Hypercapnic Respiratory Failure -continue Full MV support -continue Bronchodilator Therapy -Wean Fio2 and PEEP as tolerated -VAP/VENT bundle implementation  ACUTE KIDNEY INJURY/Renal Failure -continue Foley Catheter-assess need -Avoid nephrotoxic agents -Follow urine output, BMP -Ensure adequate renal perfusion, optimize oxygenation -Renal dose medications   NEUROLOGY - intubated and sedated - minimal sedation to achieve a RASS goal: -1   CARDIAC ICU monitoring   GI GI PROPHYLAXIS as indicated   DIET-->TF's as tolerated Constipation protocol as indicated  ENDO - will use ICU hypoglycemic\Hyperglycemia protocol if indicated     ELECTROLYTES -follow labs as needed -replace as needed -pharmacy consultation and following   DVT/GI PRX ordered and assessed TRANSFUSIONS AS NEEDED MONITOR FSBS I Assessed the need for Labs I Assessed the need for Foley I Assessed the need for Central Venous Line Family Discussion when available I  Assessed the need for Mobilization I made an Assessment of medications to be adjusted accordingly Safety Risk assessment completed   CASE DISCUSSED IN MULTIDISCIPLINARY ROUNDS WITH ICU TEAM  Critical Care Time devoted to patient care services described in this note is 32 minutes.   Overall, patient is critically ill, prognosis is guarded.  Patient with Multiorgan failure and at high risk for cardiac arrest and death.    6.2(B, M.D.  [facility]  Medical Director [facility] Medical Director [facility] Cardio-Pulmonary Department

## 2019-07-30 NOTE — Progress Notes (Signed)
NP aware of elevated BP not being controlled with current ordered scheduled and PRN Hydralazine. States that she will look at home medications and enter orders.

## 2019-07-31 LAB — CBC WITH DIFFERENTIAL/PLATELET
Abs Immature Granulocytes: 0.06 10*3/uL (ref 0.00–0.07)
Basophils Absolute: 0 10*3/uL (ref 0.0–0.1)
Basophils Relative: 0 %
Eosinophils Absolute: 0 10*3/uL (ref 0.0–0.5)
Eosinophils Relative: 0 %
HCT: 38.1 % (ref 36.0–46.0)
Hemoglobin: 12.5 g/dL (ref 12.0–15.0)
Immature Granulocytes: 1 %
Lymphocytes Relative: 6 %
Lymphs Abs: 0.6 10*3/uL — ABNORMAL LOW (ref 0.7–4.0)
MCH: 28.7 pg (ref 26.0–34.0)
MCHC: 32.8 g/dL (ref 30.0–36.0)
MCV: 87.6 fL (ref 80.0–100.0)
Monocytes Absolute: 0.5 10*3/uL (ref 0.1–1.0)
Monocytes Relative: 6 %
Neutro Abs: 8.4 10*3/uL — ABNORMAL HIGH (ref 1.7–7.7)
Neutrophils Relative %: 87 %
Platelets: 124 10*3/uL — ABNORMAL LOW (ref 150–400)
RBC: 4.35 MIL/uL (ref 3.87–5.11)
RDW: 13.4 % (ref 11.5–15.5)
WBC: 9.6 10*3/uL (ref 4.0–10.5)
nRBC: 0 % (ref 0.0–0.2)

## 2019-07-31 LAB — RENAL FUNCTION PANEL
Albumin: 3 g/dL — ABNORMAL LOW (ref 3.5–5.0)
Anion gap: 7 (ref 5–15)
BUN: 27 mg/dL — ABNORMAL HIGH (ref 8–23)
CO2: 22 mmol/L (ref 22–32)
Calcium: 8.2 mg/dL — ABNORMAL LOW (ref 8.9–10.3)
Chloride: 110 mmol/L (ref 98–111)
Creatinine, Ser: 1.31 mg/dL — ABNORMAL HIGH (ref 0.44–1.00)
GFR calc Af Amer: 44 mL/min — ABNORMAL LOW (ref >60)
GFR calc non Af Amer: 38 mL/min — ABNORMAL LOW (ref >60)
Glucose, Bld: 136 mg/dL — ABNORMAL HIGH (ref 70–99)
Phosphorus: 4 mg/dL (ref 2.5–4.6)
Potassium: 3.4 mmol/L — ABNORMAL LOW (ref 3.5–5.1)
Sodium: 139 mmol/L (ref 135–145)

## 2019-07-31 LAB — MAGNESIUM: Magnesium: 2.1 mg/dL (ref 1.7–2.4)

## 2019-07-31 LAB — TRIGLYCERIDES: Triglycerides: 143 mg/dL (ref <150)

## 2019-07-31 MED ORDER — ENOXAPARIN SODIUM 40 MG/0.4ML ~~LOC~~ SOLN
40.0000 mg | SUBCUTANEOUS | Status: DC
  Administered 2019-07-31 – 2019-08-04 (×5): 40 mg via SUBCUTANEOUS
  Filled 2019-07-31 (×5): qty 0.4

## 2019-07-31 MED ORDER — PRO-STAT 64 PO LIQD
30.0000 mL | Freq: Three times a day (TID) | ORAL | Status: DC
  Administered 2019-07-31 – 2019-08-03 (×8): 30 mL
  Filled 2019-07-31: qty 30

## 2019-07-31 MED ORDER — VITAL HIGH PROTEIN PO LIQD
1000.0000 mL | ORAL | Status: DC
  Administered 2019-07-31 – 2019-08-08 (×8): 1000 mL

## 2019-07-31 MED ORDER — ADULT MULTIVITAMIN LIQUID CH
15.0000 mL | Freq: Every day | ORAL | Status: DC
  Administered 2019-08-01 – 2019-08-03 (×3): 15 mL
  Filled 2019-07-31 (×3): qty 15

## 2019-07-31 MED ORDER — POTASSIUM CHLORIDE 10 MEQ/100ML IV SOLN
10.0000 meq | INTRAVENOUS | Status: AC
  Administered 2019-07-31 (×4): 10 meq via INTRAVENOUS
  Filled 2019-07-31 (×4): qty 100

## 2019-07-31 NOTE — Progress Notes (Signed)
Patient continues to be intubated and sedated. Tongue swelling has greatly improved but continues to be slightly swollen. Patients BP continues to be elevated, scheduled and PRN hydralazine given per order, SBP sustained most of the shift in the 170's. OG tube remains to LWIS per order, clamped for 1 hour post  medication administration. Bair hugger on patient throughout this shift to keep patients temperature WNL.

## 2019-07-31 NOTE — Progress Notes (Signed)
Initial Nutrition Assessment  DOCUMENTATION CODES:   Obesity unspecified  INTERVENTION:   Initiate Vital HP @20ml /hr + Prostat 12ml TID via tube  Propofol: 27.9 ml/hr- provides 736kcal/day   Free water flushes 50ml q4 hours to maintain tube patency   Regimen provides 1516kcal/day, 87g/day protein and 535ml/day free water   Liquid MVI daily via tube   NUTRITION DIAGNOSIS:   Inadequate oral intake related to inability to eat (pt sedated and ventilated) as evidenced by NPO status.  GOAL:   Provide needs based on ASPEN/SCCM guidelines  MONITOR:   Vent status, Labs, Weight trends, Skin, I & O's, TF tolerance  REASON FOR ASSESSMENT:   Ventilator    ASSESSMENT:   85 y/o female with h/o HTN, CHF, CAD, dementia, MI and hyperthyroidism who is admitted with angioedema requiring intubation.   Pt sedated and ventilated. OGT in place. Will initiate tube feeds today. Per chart, pt down 20lbs(9%) over the past year; RD unsure how recently weight loss occurred. Suspect pt with good appetite and oral intake at baseline.   Medications reviewed and include: dexamethasone, colace, lovenox, tapazole, miralax, pepcid, fentanyl, propofol  Labs reviewed: K 3.4(L), BUN 27(H), creat 1.31(H)  Patient is currently intubated on ventilator support MV: 7.4 L/min Temp (24hrs), Avg:98.9 F (37.2 C), Min:97.9 F (36.6 C), Max:99.5 F (37.5 C)  Propofol: 27.9 ml/hr- provides 736kcal/day   MAP- >83mmHg   UOP- 77m  NUTRITION - FOCUSED PHYSICAL EXAM:    Most Recent Value  Orbital Region No depletion  Upper Arm Region No depletion  Thoracic and Lumbar Region No depletion  Buccal Region No depletion  Temple Region No depletion  Clavicle Bone Region No depletion  Clavicle and Acromion Bone Region No depletion  Scapular Bone Region No depletion  Dorsal Hand No depletion  Patellar Region No depletion  Anterior Thigh Region No depletion  Posterior Calf Region No depletion  Edema (RD  Assessment) Mild  Hair Reviewed  Eyes Reviewed  Mouth Reviewed  Skin Reviewed  Nails Reviewed     Diet Order:   Diet Order            Diet NPO time specified  Diet effective now                EDUCATION NEEDS:   No education needs have been identified at this time  Skin:  Skin Assessment: Reviewed RN Assessment  Last BM:  pta  Height:   Ht Readings from Last 1 Encounters:  07/31/19 5' 2.01" (1.575 m)    Weight:   Wt Readings from Last 1 Encounters:  07/31/19 89.2 kg    Ideal Body Weight:  50 kg  BMI:  Body mass index is 35.96 kg/m.  Estimated Nutritional Needs:   Kcal:  980-1247kcal/day  Protein:  >100g/day  Fluid:  1.5L/day  08/02/19 MS, RD, LDN Please refer to [facility] for RD and/or RD on-call/weekend/after hours pager

## 2019-07-31 NOTE — Progress Notes (Signed)
CRITICAL CARE NOTE 7/16 admitted to ICU for severe angioedema 7/17 severe angioedema, resp failure 7/18 severe angioedema, resp failure 7/19 severe andioedema, resp failure   CC  follow up respiratory failure  SUBJECTIVE Patient remains critically ill Prognosis is guarded lipsa dn tongue remains swollen, slight improvement   BP (!) 164/54   Pulse (!) 46   Temp 99 F (37.2 C)   Resp 15   Ht 5' 2.01" (1.575 m)   Wt 89.2 kg   LMP  (LMP Unknown)   SpO2 100%   BMI 35.96 kg/m    I/O last 3 completed shifts: In: 1864.4 [I.V.:1674.4; NG/GT:150; IV Piggyback:40] Out: 2125 [Urine:2005; Emesis/NG output:120] No intake/output data recorded.  SpO2: 100 % FiO2 (%): 28 %  Estimated body mass index is 35.96 kg/m as calculated from the following:   Height as of this encounter: 5' 2.01" (1.575 m).   Weight as of this encounter: 89.2 kg.  SIGNIFICANT EVENTS   REVIEW OF SYSTEMS  PATIENT IS UNABLE TO PROVIDE COMPLETE REVIEW OF SYSTEMS DUE TO SEVERE CRITICAL ILLNESS        PHYSICAL EXAMINATION:  GENERAL:critically ill appearing, +resp distress HEAD: Normocephalic, atraumatic.  EYES: Pupils equal, round, reactive to light.  No scleral icterus.  MOUTH: +lips and tongue swelling NECK: Supple.  PULMONARY: +rhonchi,  CARDIOVASCULAR: S1 and S2. Regular rate and rhythm. No murmurs, rubs, or gallops.  GASTROINTESTINAL: Soft, nontender, -distended.  Positive bowel sounds.   MUSCULOSKELETAL: No swelling, clubbing, or edema.  NEUROLOGIC: obtunded, GCS<8 SKIN:intact,warm,dry  MEDICATIONS: I have reviewed all medications and confirmed regimen as documented   CULTURE RESULTS   Recent Results (from the past 240 hour(s))  SARS Coronavirus 2 by RT PCR (hospital order, performed in [facility] hospital lab) Nasopharyngeal Nasopharyngeal Swab     Status: None   Collection Time: 07/28/19  3:43 PM   Specimen: Nasopharyngeal Swab  Result Value Ref Range Status   SARS Coronavirus 2  NEGATIVE NEGATIVE Final    Comment: (NOTE) SARS-CoV-2 target nucleic acids are NOT DETECTED.  The SARS-CoV-2 RNA is generally detectable in upper and lower respiratory specimens during the acute phase of infection. The lowest concentration of SARS-CoV-2 viral copies this assay can detect is 250 copies / mL. A negative result does not preclude SARS-CoV-2 infection and should not be used as the sole basis for treatment or other patient management decisions.  A negative result may occur with improper specimen collection / handling, submission of specimen other than nasopharyngeal swab, presence of viral mutation(s) within the areas targeted by this assay, and inadequate number of viral copies (<250 copies / mL). A negative result must be combined with clinical observations, patient history, and epidemiological information.  Fact Sheet for Patients:   StrictlyIdeas.no  Fact Sheet for Healthcare Providers: BankingDealers.co.za  This test is not yet approved or  cleared by the [place] FDA and has been authorized for detection and/or diagnosis of SARS-CoV-2 by FDA under an Emergency Use Authorization (EUA).  This EUA will remain in effect (meaning this test can be used) for the duration of the COVID-19 declaration under Section 564(b)(1) of the Act, 21 U.S.C. section 360bbb-3(b)(1), unless the authorization is terminated or revoked sooner.  Performed at [facility], [address] [place], [place] [address]   MRSA PCR Screening     Status: None   Collection Time: 07/28/19  5:46 PM   Specimen: Nasal Mucosa; Nasopharyngeal  Result Value Ref Range Status   MRSA by PCR NEGATIVE NEGATIVE  Final    Comment:        The GeneXpert MRSA Assay (FDA approved for NASAL specimens only), is one component of a comprehensive MRSA colonization surveillance program. It is not intended to diagnose MRSA infection nor to guide or monitor  treatment for MRSA infections. Performed at [facility], [address] [place], [place] [address]          BMP Latest Ref Rng & Units 07/31/2019 07/30/2019 07/29/2019  Glucose 70 - 99 mg/dL 136(H) 154(H) 145(H)  BUN 8 - 23 mg/dL 27(H) 23 17  Creatinine 0.44 - 1.00 mg/dL 1.31(H) 1.64(H) 1.44(H)  BUN/Creat Ratio 6 - 22 (calc) - - -  Sodium 135 - 145 mmol/L 139 137 139  Potassium 3.5 - 5.1 mmol/L 3.4(L) 3.4(L) 3.7  Chloride 98 - 111 mmol/L 110 106 106  CO2 22 - 32 mmol/L 22 22 20(L)  Calcium 8.9 - 10.3 mg/dL 8.2(L) 8.4(L) 9.3     Indwelling Urinary Catheter continued, requirement due to   Reason to continue Indwelling Urinary Catheter strict Intake/Output monitoring for hemodynamic instability         Ventilator continued, requirement due to severe respiratory failure   Ventilator Sedation RASS 0 to -2      ASSESSMENT AND PLAN SYNOPSIS Severe ACUTE Hypoxic and Hypercapnic Respiratory Failurefrom severe angioedema dn severe airway obstruction  Severe ACUTE Hypoxic and Hypercapnic Respiratory Failure -continue Full MV support -continue Bronchodilator Therapy -Wean Fio2 and PEEP as tolerated -will perform SAT/SBT when respiratory parameters are met -VAP/VENT bundle implementation     ACUTE KIDNEY INJURY/Renal Failure -continue Foley Catheter-assess need -Avoid nephrotoxic agents -Follow urine output, BMP -Ensure adequate renal perfusion, optimize oxygenation -Renal dose medications     NEUROLOGY - intubated and sedated - minimal sedation to achieve a RASS goal: -1  CARDIAC ICU monitoring   GI GI PROPHYLAXIS as indicated  NUTRITIONAL STATUS Nutrition Status:         DIET-->TF's as tolerated Constipation protocol as indicated  ENDO - will use ICU hypoglycemic\Hyperglycemia protocol if indicated     ELECTROLYTES -follow labs as needed -replace as needed -pharmacy consultation and following   DVT/GI PRX ordered and  assessed TRANSFUSIONS AS NEEDED MONITOR FSBS I Assessed the need for Labs I Assessed the need for Foley I Assessed the need for Central Venous Line Family Discussion when available I Assessed the need for Mobilization I made an Assessment of medications to be adjusted accordingly Safety Risk assessment completed   CASE DISCUSSED IN MULTIDISCIPLINARY ROUNDS WITH ICU TEAM  Critical Care Time devoted to patient care services described in this note is 32 minutes.   Overall, patient is critically ill, prognosis is guarded.    Corrin Parker, M.D.  [facility]  Medical Director [place] Director [facility] Cardio-Pulmonary Department

## 2019-07-31 NOTE — Progress Notes (Addendum)
Patient has remained intubated and sedated throughout this shift.  Sedation remains the same with Propofol and Fentanyl.  No PRNs required for sedation.  Patient appears comfortable.  Patient remains hypertensive requiring PRN Hydralazine along with scheduled hydralazine.  Blood pressures still remain elevated.  MD aware.  Urine output adequate and is now a light greenish color due to propofol.  Tongue swelling has gone done significantly over the last two days and slightly over this shift.  Able to now visualize two teeth on one side of her lower mouth. Patient has remained normothermic this shift and has not required the bair hugger.  Daughter currently at bedside.   Patient is typically bradycardic in the mid to upper 40s-mid 50s but has had a few times where she will randomly go into sinus tachycardia in the 130s but with then go back to sinus bradycardia.  MD aware.

## 2019-07-31 NOTE — Progress Notes (Signed)
Pt. Has a cuff leak when deflated.

## 2019-08-01 LAB — RENAL FUNCTION PANEL
Albumin: 2.7 g/dL — ABNORMAL LOW (ref 3.5–5.0)
Anion gap: 6 (ref 5–15)
BUN: 38 mg/dL — ABNORMAL HIGH (ref 8–23)
CO2: 24 mmol/L (ref 22–32)
Calcium: 8.3 mg/dL — ABNORMAL LOW (ref 8.9–10.3)
Chloride: 111 mmol/L (ref 98–111)
Creatinine, Ser: 1.15 mg/dL — ABNORMAL HIGH (ref 0.44–1.00)
GFR calc Af Amer: 51 mL/min — ABNORMAL LOW (ref >60)
GFR calc non Af Amer: 44 mL/min — ABNORMAL LOW (ref >60)
Glucose, Bld: 140 mg/dL — ABNORMAL HIGH (ref 70–99)
Phosphorus: 3.4 mg/dL (ref 2.5–4.6)
Potassium: 3.5 mmol/L (ref 3.5–5.1)
Sodium: 141 mmol/L (ref 135–145)

## 2019-08-01 LAB — CBC WITH DIFFERENTIAL/PLATELET
Abs Immature Granulocytes: 0.08 10*3/uL — ABNORMAL HIGH (ref 0.00–0.07)
Basophils Absolute: 0 10*3/uL (ref 0.0–0.1)
Basophils Relative: 0 %
Eosinophils Absolute: 0 10*3/uL (ref 0.0–0.5)
Eosinophils Relative: 0 %
HCT: 37.1 % (ref 36.0–46.0)
Hemoglobin: 13 g/dL (ref 12.0–15.0)
Immature Granulocytes: 1 %
Lymphocytes Relative: 6 %
Lymphs Abs: 0.6 10*3/uL — ABNORMAL LOW (ref 0.7–4.0)
MCH: 29.7 pg (ref 26.0–34.0)
MCHC: 35 g/dL (ref 30.0–36.0)
MCV: 84.9 fL (ref 80.0–100.0)
Monocytes Absolute: 1.1 10*3/uL — ABNORMAL HIGH (ref 0.1–1.0)
Monocytes Relative: 10 %
Neutro Abs: 8.9 10*3/uL — ABNORMAL HIGH (ref 1.7–7.7)
Neutrophils Relative %: 83 %
Platelets: 118 10*3/uL — ABNORMAL LOW (ref 150–400)
RBC: 4.37 MIL/uL (ref 3.87–5.11)
RDW: 13.2 % (ref 11.5–15.5)
WBC: 10.6 10*3/uL — ABNORMAL HIGH (ref 4.0–10.5)
nRBC: 0 % (ref 0.0–0.2)

## 2019-08-01 LAB — MAGNESIUM: Magnesium: 2.4 mg/dL (ref 1.7–2.4)

## 2019-08-01 LAB — TRIGLYCERIDES: Triglycerides: 122 mg/dL (ref <150)

## 2019-08-01 NOTE — Progress Notes (Signed)
Patient has cuff leak when cuff deflated.

## 2019-08-01 NOTE — Progress Notes (Signed)
CRITICAL CARE NOTE 7/16 admitted to ICU for severe angioedema 7/17 severe angioedema, resp failure 7/18 severe angioedema, resp failure 7/19 severe andioedema, resp failure 7/20 severe angioedema, resp failure    CC  follow up respiratory failure  SUBJECTIVE Patient remains critically ill Prognosis is guarded   BP (!) 165/56   Pulse (!) 42   Temp 98.6 F (37 C)   Resp 15   Ht 5' 2.01" (1.575 m)   Wt 92.2 kg   LMP  (LMP Unknown)   SpO2 100%   BMI 37.17 kg/m    I/O last 3 completed shifts: In: 2174.5 [I.V.:1674.6; NG/GT:60; IV Piggyback:439.9] Out: 2855 [Urine:2835; Emesis/NG output:20] No intake/output data recorded.  SpO2: 100 % FiO2 (%): 28 %  Estimated body mass index is 37.17 kg/m as calculated from the following:   Height as of this encounter: 5' 2.01" (1.575 m).   Weight as of this encounter: 92.2 kg.  SIGNIFICANT EVENTS   REVIEW OF SYSTEMS  PATIENT IS UNABLE TO PROVIDE COMPLETE REVIEW OF SYSTEMS DUE TO SEVERE CRITICAL ILLNESS        PHYSICAL EXAMINATION:  GENERAL:critically ill appearing, +resp distress HEAD: Normocephalic, atraumatic.  EYES: Pupils equal, round, reactive to light.  No scleral icterus.  MOUTH: lips and tongue swelling NECK: Supple.  PULMONARY: +rhonchi, +wheezing CARDIOVASCULAR: S1 and S2. Regular rate and rhythm. No murmurs, rubs, or gallops.  GASTROINTESTINAL: Soft, nontender, -distended.  Positive bowel sounds.   MUSCULOSKELETAL: No swelling, clubbing, or edema.  NEUROLOGIC: obtunded, GCS<8 SKIN:intact,warm,dry  MEDICATIONS: I have reviewed all medications and confirmed regimen as documented   CULTURE RESULTS   Recent Results (from the past 240 hour(s))  SARS Coronavirus 2 by RT PCR (hospital order, performed in [facility] hospital lab) Nasopharyngeal Nasopharyngeal Swab     Status: None   Collection Time: 07/28/19  3:43 PM   Specimen: Nasopharyngeal Swab  Result Value Ref Range Status   SARS Coronavirus 2  NEGATIVE NEGATIVE Final    Comment: (NOTE) SARS-CoV-2 target nucleic acids are NOT DETECTED.  The SARS-CoV-2 RNA is generally detectable in upper and lower respiratory specimens during the acute phase of infection. The lowest concentration of SARS-CoV-2 viral copies this assay can detect is 250 copies / mL. A negative result does not preclude SARS-CoV-2 infection and should not be used as the sole basis for treatment or other patient management decisions.  A negative result may occur with improper specimen collection / handling, submission of specimen other than nasopharyngeal swab, presence of viral mutation(s) within the areas targeted by this assay, and inadequate number of viral copies (<250 copies / mL). A negative result must be combined with clinical observations, patient history, and epidemiological information.  Fact Sheet for Patients:   BoilerBrush.com.cy  Fact Sheet for Healthcare Providers: https://pope.com/  This test is not yet approved or  cleared by the [place] FDA and has been authorized for detection and/or diagnosis of SARS-CoV-2 by FDA under an Emergency Use Authorization (EUA).  This EUA will remain in effect (meaning this test can be used) for the duration of the COVID-19 declaration under Section 564(b)(1) of the Act, 21 U.S.C. section 360bbb-3(b)(1), unless the authorization is terminated or revoked sooner.  Performed at [facility], [address] [place], [place] [address]   MRSA PCR Screening     Status: None   Collection Time: 07/28/19  5:46 PM   Specimen: Nasal Mucosa; Nasopharyngeal  Result Value Ref Range Status   MRSA by PCR NEGATIVE NEGATIVE Final  Comment:        The GeneXpert MRSA Assay (FDA approved for NASAL specimens only), is one component of a comprehensive MRSA colonization surveillance program. It is not intended to diagnose MRSA infection nor to guide or monitor  treatment for MRSA infections. Performed at [facility], [address] [place], [place] [address]           IMAGING    No results found.   Nutrition Status: Nutrition Problem: Inadequate oral intake Etiology: inability to eat (pt sedated and ventilated) Signs/Symptoms: NPO status Interventions: Tube feeding   BMP Latest Ref Rng & Units 08/01/2019 07/31/2019 07/30/2019  Glucose 70 - 99 mg/dL 151(V) 616(W) 737(T)  BUN 8 - 23 mg/dL 06(Y) 69(S) 23  Creatinine 0.44 - 1.00 mg/dL 8.54(O) 2.70(J) 5.00(X)  BUN/Creat Ratio 6 - 22 (calc) - - -  Sodium 135 - 145 mmol/L 141 139 137  Potassium 3.5 - 5.1 mmol/L 3.5 3.4(L) 3.4(L)  Chloride 98 - 111 mmol/L 111 110 106  CO2 22 - 32 mmol/L 24 22 22   Calcium 8.9 - 10.3 mg/dL 8.3(L) 8.2(L) 8.4(L)     Indwelling Urinary Catheter continued, requirement due to   Reason to continue Indwelling Urinary Catheter strict Intake/Output monitoring for hemodynamic instability         Ventilator continued, requirement due to severe respiratory failure   Ventilator Sedation RASS 0 to -2      ASSESSMENT AND PLAN SYNOPSIS Severe ACUTE Hypoxic and Hypercapnic Respiratory Failurefrom severe angioedema and severe airway obstruction  Severe ACUTE Hypoxic and Hypercapnic Respiratory Failure -continue Full MV support -continue Bronchodilator Therapy -Wean Fio2 and PEEP as tolerated -VAP/VENT bundle implementation Unable to wean due to severe upper airway swelling    ACUTE KIDNEY INJURY/Renal Failure -continue Foley Catheter-assess need -Avoid nephrotoxic agents -Follow urine output, BMP -Ensure adequate renal perfusion, optimize oxygenation -Renal dose medications     NEUROLOGY - intubated and sedated - minimal sedation to achieve a RASS goal: -1  CARDIAC ICU monitoring  GI GI PROPHYLAXIS as indicated   DIET-->TF's as tolerated Constipation protocol as indicated  ENDO - will use ICU hypoglycemic\Hyperglycemia  protocol if indicated     ELECTROLYTES -follow labs as needed -replace as needed -pharmacy consultation and following   DVT/GI PRX ordered and assessed TRANSFUSIONS AS NEEDED MONITOR FSBS I Assessed the need for Labs I Assessed the need for Foley I Assessed the need for Central Venous Line Family Discussion when available I Assessed the need for Mobilization I made an Assessment of medications to be adjusted accordingly Safety Risk assessment completed   CASE DISCUSSED IN MULTIDISCIPLINARY ROUNDS WITH ICU TEAM  Critical Care Time devoted to patient care services described in this note is 32 minutes.   Overall, patient is critically ill, prognosis is guarded.   , M.D.  [facility]  Medical Director [facility] Medical Director [facility] Cardio-Pulmonary Department

## 2019-08-02 DIAGNOSIS — T783XXA Angioneurotic edema, initial encounter: Secondary | ICD-10-CM | POA: Diagnosis not present

## 2019-08-02 DIAGNOSIS — J9601 Acute respiratory failure with hypoxia: Secondary | ICD-10-CM | POA: Diagnosis not present

## 2019-08-02 LAB — MAGNESIUM: Magnesium: 2.7 mg/dL — ABNORMAL HIGH (ref 1.7–2.4)

## 2019-08-02 LAB — CBC WITH DIFFERENTIAL/PLATELET
Abs Immature Granulocytes: 0.12 10*3/uL — ABNORMAL HIGH (ref 0.00–0.07)
Basophils Absolute: 0 10*3/uL (ref 0.0–0.1)
Basophils Relative: 0 %
Eosinophils Absolute: 0 10*3/uL (ref 0.0–0.5)
Eosinophils Relative: 0 %
HCT: 39.1 % (ref 36.0–46.0)
Hemoglobin: 13.2 g/dL (ref 12.0–15.0)
Immature Granulocytes: 1 %
Lymphocytes Relative: 6 %
Lymphs Abs: 0.7 10*3/uL (ref 0.7–4.0)
MCH: 28.9 pg (ref 26.0–34.0)
MCHC: 33.8 g/dL (ref 30.0–36.0)
MCV: 85.7 fL (ref 80.0–100.0)
Monocytes Absolute: 1.3 10*3/uL — ABNORMAL HIGH (ref 0.1–1.0)
Monocytes Relative: 10 %
Neutro Abs: 10.3 10*3/uL — ABNORMAL HIGH (ref 1.7–7.7)
Neutrophils Relative %: 83 %
Platelets: 116 10*3/uL — ABNORMAL LOW (ref 150–400)
RBC: 4.56 MIL/uL (ref 3.87–5.11)
RDW: 13.4 % (ref 11.5–15.5)
WBC: 12.4 10*3/uL — ABNORMAL HIGH (ref 4.0–10.5)
nRBC: 0 % (ref 0.0–0.2)

## 2019-08-02 LAB — RENAL FUNCTION PANEL
Albumin: 2.7 g/dL — ABNORMAL LOW (ref 3.5–5.0)
Anion gap: 8 (ref 5–15)
BUN: 43 mg/dL — ABNORMAL HIGH (ref 8–23)
CO2: 23 mmol/L (ref 22–32)
Calcium: 8.4 mg/dL — ABNORMAL LOW (ref 8.9–10.3)
Chloride: 110 mmol/L (ref 98–111)
Creatinine, Ser: 1.05 mg/dL — ABNORMAL HIGH (ref 0.44–1.00)
GFR calc Af Amer: 57 mL/min — ABNORMAL LOW (ref >60)
GFR calc non Af Amer: 49 mL/min — ABNORMAL LOW (ref >60)
Glucose, Bld: 146 mg/dL — ABNORMAL HIGH (ref 70–99)
Phosphorus: 3.1 mg/dL (ref 2.5–4.6)
Potassium: 3.7 mmol/L (ref 3.5–5.1)
Sodium: 141 mmol/L (ref 135–145)

## 2019-08-02 LAB — TRIGLYCERIDES: Triglycerides: 100 mg/dL (ref <150)

## 2019-08-02 MED ORDER — SODIUM CHLORIDE 0.9 % IV SOLN
3.0000 g | Freq: Four times a day (QID) | INTRAVENOUS | Status: DC
  Administered 2019-08-02 – 2019-08-07 (×20): 3 g via INTRAVENOUS
  Filled 2019-08-02: qty 3
  Filled 2019-08-02: qty 8
  Filled 2019-08-02 (×11): qty 3
  Filled 2019-08-02: qty 8
  Filled 2019-08-02 (×5): qty 3
  Filled 2019-08-02 (×4): qty 8
  Filled 2019-08-02: qty 3

## 2019-08-02 NOTE — Progress Notes (Signed)
Cuff leak noted after cuff deflated.

## 2019-08-02 NOTE — Progress Notes (Signed)
CRITICAL CARE NOTE 7/16 admitted to ICU for severe angioedema 7/17 severe angioedema, resp failure 7/18 severe angioedema, resp failure 7/19 severe andioedema, resp failure 7/20 severe angioedema, resp failure  CC  follow up respiratory failure  SUBJECTIVE Patient remains critically ill Prognosis is guarded   BP (!) 168/52   Pulse (!) 44   Temp 98.2 F (36.8 C)   Resp 15   Ht 5' 2.01" (1.575 m)   Wt 92 kg   LMP  (LMP Unknown)   SpO2 99%   BMI 37.09 kg/m    I/O last 3 completed shifts: In: 1506.8 [I.V.:1456.8; IV Piggyback:50] Out: 1925 [Urine:1925] No intake/output data recorded.  SpO2: 99 % FiO2 (%): 28 %  Estimated body mass index is 37.09 kg/m as calculated from the following:   Height as of this encounter: 5' 2.01" (1.575 m).   Weight as of this encounter: 92 kg.  SIGNIFICANT EVENTS   REVIEW OF SYSTEMS  PATIENT IS UNABLE TO PROVIDE COMPLETE REVIEW OF SYSTEMS DUE TO SEVERE CRITICAL ILLNESS        PHYSICAL EXAMINATION:  GENERAL:critically ill appearing, +resp distress HEAD: Normocephalic, atraumatic.  EYES: Pupils equal, round, reactive to light.  No scleral icterus.  MOUTH: lips tongue swelling NECK: Supple.  PULMONARY: +rhonchi, +wheezing CARDIOVASCULAR: S1 and S2. Regular rate and rhythm. No murmurs, rubs, or gallops.  GASTROINTESTINAL: Soft, nontender, -distended.  Positive bowel sounds.   MUSCULOSKELETAL: No swelling, clubbing, or edema.  NEUROLOGIC: obtunded, GCS<8 SKIN:intact,warm,dry  MEDICATIONS: I have reviewed all medications and confirmed regimen as documented   CULTURE RESULTS   Recent Results (from the past 240 hour(s))  SARS Coronavirus 2 by RT PCR (hospital order, performed in [facility] hospital lab) Nasopharyngeal Nasopharyngeal Swab     Status: None   Collection Time: 07/28/19  3:43 PM   Specimen: Nasopharyngeal Swab  Result Value Ref Range Status   SARS Coronavirus 2 NEGATIVE NEGATIVE Final    Comment:  (NOTE) SARS-CoV-2 target nucleic acids are NOT DETECTED.  The SARS-CoV-2 RNA is generally detectable in upper and lower respiratory specimens during the acute phase of infection. The lowest concentration of SARS-CoV-2 viral copies this assay can detect is 250 copies / mL. A negative result does not preclude SARS-CoV-2 infection and should not be used as the sole basis for treatment or other patient management decisions.  A negative result may occur with improper specimen collection / handling, submission of specimen other than nasopharyngeal swab, presence of viral mutation(s) within the areas targeted by this assay, and inadequate number of viral copies (<250 copies / mL). A negative result must be combined with clinical observations, patient history, and epidemiological information.  Fact Sheet for Patients:   BoilerBrush.com.cy  Fact Sheet for Healthcare Providers: https://pope.com/  This test is not yet approved or  cleared by the [place] FDA and has been authorized for detection and/or diagnosis of SARS-CoV-2 by FDA under an Emergency Use Authorization (EUA).  This EUA will remain in effect (meaning this test can be used) for the duration of the COVID-19 declaration under Section 564(b)(1) of the Act, 21 U.S.C. section 360bbb-3(b)(1), unless the authorization is terminated or revoked sooner.  Performed at [facility], [address] [place], [place] [address]   MRSA PCR Screening     Status: None   Collection Time: 07/28/19  5:46 PM   Specimen: Nasal Mucosa; Nasopharyngeal  Result Value Ref Range Status   MRSA by PCR NEGATIVE NEGATIVE Final    Comment:  The GeneXpert MRSA Assay (FDA approved for NASAL specimens only), is one component of a comprehensive MRSA colonization surveillance program. It is not intended to diagnose MRSA infection nor to guide or monitor treatment for MRSA  infections. Performed at [facility], [address], [place], [place] [address]           IMAGING    No results found.   Nutrition Status: Nutrition Problem: Inadequate oral intake Etiology: inability to eat (pt sedated and ventilated) Signs/Symptoms: NPO status Interventions: Tube feeding     Indwelling Urinary Catheter continued, requirement due to   Reason to continue Indwelling Urinary Catheter strict Intake/Output monitoring for hemodynamic instability         Ventilator continued, requirement due to severe respiratory failure   Ventilator Sedation RASS 0 to -2    CBC    Component Value Date/Time   WBC 12.4 (H) 08/02/2019 0422   RBC 4.56 08/02/2019 0422   HGB 13.2 08/02/2019 0422   HGB 12.6 02/11/2014 1259   HCT 39.1 08/02/2019 0422   HCT 37.8 02/11/2014 1259   PLT 116 (L) 08/02/2019 0422   PLT 155 02/11/2014 1259   MCV 85.7 08/02/2019 0422   MCV 86 02/11/2014 1259   MCH 28.9 08/02/2019 0422   MCHC 33.8 08/02/2019 0422   RDW 13.4 08/02/2019 0422   RDW 13.9 02/11/2014 1259   LYMPHSABS 0.7 08/02/2019 0422   LYMPHSABS 1.0 02/11/2014 1259   MONOABS 1.3 (H) 08/02/2019 0422   MONOABS 0.7 02/11/2014 1259   EOSABS 0.0 08/02/2019 0422   EOSABS 0.1 02/11/2014 1259   BASOSABS 0.0 08/02/2019 0422   BASOSABS 0.2 (H) 02/11/2014 1259   BMP Latest Ref Rng & Units 08/02/2019 08/01/2019 07/31/2019  Glucose 70 - 99 mg/dL 902(I) 097(D) 532(D)  BUN 8 - 23 mg/dL 92(E) 26(S) 34(H)  Creatinine 0.44 - 1.00 mg/dL 9.62(I) 2.97(L) 8.92(J)  BUN/Creat Ratio 6 - 22 (calc) - - -  Sodium 135 - 145 mmol/L 141 141 139  Potassium 3.5 - 5.1 mmol/L 3.7 3.5 3.4(L)  Chloride 98 - 111 mmol/L 110 111 110  CO2 22 - 32 mmol/L 23 24 22   Calcium 8.9 - 10.3 mg/dL ) 8.3(L) 8.2(L)     ASSESSMENT AND PLAN SYNOPSIS  Severe ACUTE Hypoxic and Hypercapnic Respiratory Failurefrom severe angioedema and severe airway obstruction  Severe ACUTE Hypoxic and Hypercapnic  Respiratory Failure -continue Full MV support -continue Bronchodilator Therapy -Wean Fio2 and PEEP as tolerated -VAP/VENT bundle implementation Has cuff leak but still has lip and tongue swelling Continue IV steroids   ACUTE KIDNEY INJURY/Renal Failure -continue Foley Catheter-assess need -Avoid nephrotoxic agents -Follow urine output, BMP -Ensure adequate renal perfusion, optimize oxygenation -Renal dose medications     NEUROLOGY - intubated and sedated - minimal sedation to achieve a RASS goal: -1  CARDIAC ICU monitoring   GI GI PROPHYLAXIS as indicated   DIET-->TF's as tolerated Constipation protocol as indicated  ENDO - will use ICU hypoglycemic\Hyperglycemia protocol if indicated     ELECTROLYTES -follow labs as needed -replace as needed -pharmacy consultation and following   DVT/GI PRX ordered and assessed TRANSFUSIONS AS NEEDED MONITOR FSBS I Assessed the need for Labs I Assessed the need for Foley I Assessed the need for Central Venous Line Family Discussion when available I Assessed the need for Mobilization I made an Assessment of medications to be adjusted accordingly Safety Risk assessment completed   CASE DISCUSSED IN MULTIDISCIPLINARY ROUNDS WITH ICU TEAM  Critical Care Time devoted to  patient care services described in this note is 32 minutes.   Overall, patient is critically ill, prognosis is guarded.    Lucie Leather, M.D.  [facility]  Medical Director [facility] Medical Director [facility] Cardio-Pulmonary Department

## 2019-08-02 NOTE — Progress Notes (Signed)
[facility] visited pt. while rounding on ICU; pt. intubated and sedated; dtr. [name] at bedside.  Dtr. reports pt. is doing 'much better' than when dtr. brought her to ED on Friday.  Dtr. shared pt. had a severe allergic reaction to medication she was taking and that her mouth and tongue swelled up; dtr. is grateful pt. is being cared for and that she seems to be making good progress.  Pt.'s other dtr. has also been visiting w/pt. and there seems to be broad family support.  Dtr. requests prayer for pt.'s treatment and recovery; no further needs at this time.

## 2019-08-03 LAB — CBC WITH DIFFERENTIAL/PLATELET
Abs Immature Granulocytes: 0.24 10*3/uL — ABNORMAL HIGH (ref 0.00–0.07)
Basophils Absolute: 0 10*3/uL (ref 0.0–0.1)
Basophils Relative: 0 %
Eosinophils Absolute: 0 10*3/uL (ref 0.0–0.5)
Eosinophils Relative: 0 %
HCT: 38.6 % (ref 36.0–46.0)
Hemoglobin: 13 g/dL (ref 12.0–15.0)
Immature Granulocytes: 2 %
Lymphocytes Relative: 6 %
Lymphs Abs: 0.8 10*3/uL (ref 0.7–4.0)
MCH: 29 pg (ref 26.0–34.0)
MCHC: 33.7 g/dL (ref 30.0–36.0)
MCV: 86.2 fL (ref 80.0–100.0)
Monocytes Absolute: 1.7 10*3/uL — ABNORMAL HIGH (ref 0.1–1.0)
Monocytes Relative: 13 %
Neutro Abs: 10.8 10*3/uL — ABNORMAL HIGH (ref 1.7–7.7)
Neutrophils Relative %: 79 %
Platelets: 120 10*3/uL — ABNORMAL LOW (ref 150–400)
RBC: 4.48 MIL/uL (ref 3.87–5.11)
RDW: 13.3 % (ref 11.5–15.5)
WBC: 13.6 10*3/uL — ABNORMAL HIGH (ref 4.0–10.5)
nRBC: 0 % (ref 0.0–0.2)

## 2019-08-03 LAB — RENAL FUNCTION PANEL
Albumin: 2.3 g/dL — ABNORMAL LOW (ref 3.5–5.0)
Anion gap: 7 (ref 5–15)
BUN: 50 mg/dL — ABNORMAL HIGH (ref 8–23)
CO2: 23 mmol/L (ref 22–32)
Calcium: 8.2 mg/dL — ABNORMAL LOW (ref 8.9–10.3)
Chloride: 114 mmol/L — ABNORMAL HIGH (ref 98–111)
Creatinine, Ser: 1.05 mg/dL — ABNORMAL HIGH (ref 0.44–1.00)
GFR calc Af Amer: 57 mL/min — ABNORMAL LOW (ref >60)
GFR calc non Af Amer: 49 mL/min — ABNORMAL LOW (ref >60)
Glucose, Bld: 146 mg/dL — ABNORMAL HIGH (ref 70–99)
Phosphorus: 2.8 mg/dL (ref 2.5–4.6)
Potassium: 3.8 mmol/L (ref 3.5–5.1)
Sodium: 144 mmol/L (ref 135–145)

## 2019-08-03 LAB — TRIGLYCERIDES: Triglycerides: 117 mg/dL (ref <150)

## 2019-08-03 LAB — MAGNESIUM: Magnesium: 3 mg/dL — ABNORMAL HIGH (ref 1.7–2.4)

## 2019-08-03 MED ORDER — PRO-STAT 64 PO LIQD
60.0000 mL | Freq: Two times a day (BID) | ORAL | Status: DC
  Administered 2019-08-03 – 2019-08-09 (×12): 60 mL
  Filled 2019-08-03: qty 60

## 2019-08-03 MED ORDER — ADULT MULTIVITAMIN W/MINERALS CH
1.0000 | ORAL_TABLET | Freq: Every day | ORAL | Status: DC
  Administered 2019-08-04 – 2019-08-18 (×15): 1
  Filled 2019-08-03 (×16): qty 1

## 2019-08-03 MED ORDER — SODIUM CHLORIDE 0.9% IV SOLUTION: Freq: Once | INTRAVENOUS | Status: AC

## 2019-08-03 MED ORDER — METHIMAZOLE 5 MG PO TABS
5.0000 mg | ORAL_TABLET | Freq: Every day | ORAL | Status: DC
  Administered 2019-08-04 – 2019-08-11 (×8): 5 mg
  Filled 2019-08-03 (×9): qty 1

## 2019-08-03 MED ORDER — DIPHENHYDRAMINE HCL 50 MG/ML IJ SOLN
50.0000 mg | Freq: Three times a day (TID) | INTRAMUSCULAR | Status: DC
  Administered 2019-08-03 – 2019-08-10 (×21): 50 mg via INTRAVENOUS
  Filled 2019-08-03 (×21): qty 1

## 2019-08-03 MED ORDER — HYDROCHLOROTHIAZIDE 50 MG PO TABS
50.0000 mg | ORAL_TABLET | Freq: Every day | ORAL | Status: DC
  Administered 2019-08-03 – 2019-08-04 (×2): 50 mg
  Filled 2019-08-03 (×2): qty 2
  Filled 2019-08-03 (×2): qty 1

## 2019-08-03 NOTE — Progress Notes (Signed)
CRITICAL CARE PROGRESS NOTE    Name: Melanie Lewis MRN: [id number] DOB: [birth year]     LOS: 6   SUBJECTIVE FINDINGS & SIGNIFICANT EVENTS    Patient description:   85 yo F with PMH as below came in with chief complaint of difficulty articulating and SOB with swelling of face/lips and throat. She was intubated due to angioedema, this was thought to be due to lisinopril.    08/03/19- Daughter [name] is at bedside, we discussed hospital course and care plan together. Questions answered.    Lines/tubes : Airway 7 mm (Active)  Secured at (cm) 24 cm 08/03/19 0804  Measured From Lips 08/03/19 [place] 08/03/19 0804  Secured By [employer] 08/03/19 0804  Tube Holder Repositioned Yes 08/03/19 0743  Cuff Pressure (cm H2O) 28 cm H2O 08/03/19 0743  Site Condition Cool;Dry;Edema 08/03/19 0743     NG/OG Tube Orogastric Right mouth Xray Documented cm marking at nare/ corner of mouth 60 cm (Active)  Cm Marking at Nare/Corner of Mouth (if applicable) 60 cm 78/67/67 0804  Site Assessment Clean;Dry;Intact 08/03/19 0804  Ongoing Placement Verification No change in cm markings or external length of tube from initial placement;No change in respiratory status 08/03/19 0804  Status Infusing tube feed 08/03/19 0804  Drainage Appearance None 08/02/19 2000  Intake (mL) 30 mL 07/31/19 0658  Output (mL) 0 mL 08/02/19 1557     Urethral Catheter Dee RN  Temperature probe 14 Fr. (Active)  Indication for Insertion or Continuance of Catheter Therapy based on hourly urine output monitoring and documentation for critical condition (NOT STRICT I&O) 08/03/19 0723  Site Assessment Clean;Intact 08/03/19 0723  Catheter Maintenance Bag below level of bladder;Catheter secured;Drainage bag/tubing not  touching floor;Insertion date on drainage bag;No dependent loops;Seal intact;Bag emptied prior to transport 08/03/19 0723  Collection Container Standard drainage bag 08/03/19 0723  Securement Method Securing device (Describe) 08/03/19 0723  Urinary Catheter Interventions (if applicable) Unclamped 20/94/70 0723  Output (mL) 175 mL 08/03/19 0436    Microbiology/Sepsis markers: Results for orders placed or performed during the hospital encounter of 07/28/19  SARS Coronavirus 2 by RT PCR (hospital order, performed in [facility] hospital lab) Nasopharyngeal Nasopharyngeal Swab     Status: None   Collection Time: 07/28/19  3:43 PM   Specimen: Nasopharyngeal Swab  Result Value Ref Range Status   SARS Coronavirus 2 NEGATIVE NEGATIVE Final    Comment: (NOTE) SARS-CoV-2 target nucleic acids are NOT DETECTED.  The SARS-CoV-2 RNA is generally detectable in upper and lower respiratory specimens during the acute phase of infection. The lowest concentration of SARS-CoV-2 viral copies this assay can detect is 250 copies / mL. A negative result does not preclude SARS-CoV-2 infection and should not be used as the sole basis for treatment or other patient management decisions.  A negative result may occur with improper specimen collection / handling, submission of specimen other than nasopharyngeal swab, presence of viral mutation(s) within the areas targeted by this assay, and inadequate number of viral copies (<250 copies / mL). A negative result must be combined with clinical observations, patient history, and epidemiological information.  Fact Sheet for Patients:   StrictlyIdeas.no  Fact Sheet for Healthcare Providers: BankingDealers.co.za  This test is not yet approved or  cleared by the [place] FDA and has been authorized for detection and/or diagnosis of SARS-CoV-2 by FDA under an Emergency Use Authorization (EUA).  This EUA will  remain in effect (meaning this test can be  used) for the duration of the COVID-19 declaration under Section 564(b)(1) of the Act, 21 U.S.C. section 360bbb-3(b)(1), unless the authorization is terminated or revoked sooner.  Performed at [facility], [address] [place], [place] [address]   MRSA PCR Screening     Status: None   Collection Time: 07/28/19  5:46 PM   Specimen: Nasal Mucosa; Nasopharyngeal  Result Value Ref Range Status   MRSA by PCR NEGATIVE NEGATIVE Final    Comment:        The GeneXpert MRSA Assay (FDA approved for NASAL specimens only), is one component of a comprehensive MRSA colonization surveillance program. It is not intended to diagnose MRSA infection nor to guide or monitor treatment for MRSA infections. Performed at [facility], [address], [place], [place] [address]     Anti-infectives:  Anti-infectives (From admission, onward)   Start     Dose/Rate Route Frequency Ordered Stop   08/02/19 1200  Ampicillin-Sulbactam (UNASYN) 3 g in sodium chloride 0.9 % 100 mL IVPB     Discontinue     3 g 200 mL/hr over 30 Minutes Intravenous Every 6 hours 08/02/19 1014          Tests / Events: C1esterase deficiency    PAST MEDICAL HISTORY   Past Medical History:  Diagnosis Date   CHF (congestive heart failure) (HCC)    Coronary artery disease    Dementia (HCC)    Heart attack ([place])    Hypertension    hyperthyroid    Hyperthyroidism    MI (myocardial infarction) ([place])      SURGICAL HISTORY   Past Surgical History:  Procedure Laterality Date   CARDIAC CATHETERIZATION N/A 08/14/2014   Procedure: Left Heart Cath and Coronary Angiography;  Surgeon: Isaias Cowman, MD;  Location: [place];  Service: Cardiovascular;  Laterality: N/A;   CHOLECYSTECTOMY     PARTIAL HYSTERECTOMY       FAMILY HISTORY   Family History  Problem Relation Age of Onset   Heart attack Sister    Hypertension  Sister    Dementia Sister    Heart attack Son    Dementia Sister      SOCIAL HISTORY   Social History   Tobacco Use   Smoking status: Never Smoker   Smokeless tobacco: Never Used  Substance Use Topics   Alcohol use: Yes    Alcohol/week: 2.0 standard drinks    Types: 2 Shots of liquor per week    Comment: occasional   Drug use: No     MEDICATIONS   Current Medication:  Current Facility-Administered Medications:    0.9 %  sodium chloride infusion (Manually program via Guardrails IV Fluids), , Intravenous, Once, Duffy Bruce, MD   0.9 %  sodium chloride infusion, 250 mL, Intravenous, PRN, Flora Lipps, MD   acetaminophen (TYLENOL) tablet 650 mg, 650 mg, Oral, Q4H PRN, Mortimer Fries, Kurian, MD   Ampicillin-Sulbactam (UNASYN) 3 g in sodium chloride 0.9 % 100 mL IVPB, 3 g, Intravenous, Q6H, Kasa, Kurian, MD, Last Rate: 200 mL/hr at 08/03/19 0615, 3 g at 08/03/19 0615   chlorhexidine gluconate (MEDLINE KIT) (PERIDEX) 0.12 % solution 15 mL, 15 mL, Mouth Rinse, BID, Kasa, Kurian, MD, 15 mL at 08/03/19 0801   Chlorhexidine Gluconate Cloth 2 % PADS 6 each, 6 each, Topical, Daily, Tukov-Yual, Magdalene S, NP, 6 each at 08/03/19 0616   dexamethasone (DECADRON) injection 4 mg, 4 mg, Intravenous, Q6H, Kasa, Kurian, MD, 4 mg at 08/03/19 [phone]  diphenhydrAMINE (BENADRYL) injection 50 mg, 50 mg, Intravenous, Q8H, Takira Sherrin, MD   docusate (COLACE) 50 MG/5ML liquid 100 mg, 100 mg, Oral, BID, Mortimer Fries, Kurian, MD, 100 mg at 08/03/19 0948   docusate sodium (COLACE) capsule 100 mg, 100 mg, Oral, BID PRN, Flora Lipps, MD   enoxaparin (LOVENOX) injection 40 mg, 40 mg, Subcutaneous, Q24H, Kasa, Kurian, MD, 40 mg at 08/02/19 1707   famotidine (PEPCID) IVPB 20 mg premix, 20 mg, Intravenous, Q24H, Flora Lipps, MD, Stopped at 08/02/19 1110   feeding supplement (PRO-STAT 64) liquid 30 mL, 30 mL, Per Tube, TID, Flora Lipps, MD, 30 mL at 08/03/19 0949   feeding supplement (VITAL HIGH  PROTEIN) liquid 1,000 mL, 1,000 mL, Per Tube, Q24H, Flora Lipps, MD, 1,000 mL at 08/02/19 1530   fentaNYL (SUBLIMAZE) bolus via infusion 25 mcg, 25 mcg, Intravenous, Q1H PRN, Duffy Bruce, MD, 25 mcg at 07/31/19 0440   fentaNYL (SUBLIMAZE) bolus via infusion 25 mcg, 25 mcg, Intravenous, Q15 min PRN, Flora Lipps, MD   fentaNYL (SUBLIMAZE) injection 25 mcg, 25 mcg, Intravenous, Once, Flora Lipps, MD   fentaNYL (SUBLIMAZE) injection 50 mcg, 50 mcg, Intravenous, Q15 min PRN, Duffy Bruce, MD, 50 mcg at 07/28/19 1521   fentaNYL (SUBLIMAZE) injection 50 mcg, 50 mcg, Intravenous, Q2H PRN, Duffy Bruce, MD   fentaNYL (SUBLIMAZE) injection 50 mcg, 50 mcg, Intravenous, Once, Duffy Bruce, MD   fentaNYL (SUBLIMAZE) injection 75 mcg, 75 mcg, Intravenous, Once, Duffy Bruce, MD   fentaNYL 2586mg in NS 2534m(1027mml) infusion-PREMIX, 25-200 mcg/hr, Intravenous, Continuous, Kasa, KurMaretta BeesD, Last Rate: 20 mL/hr at 08/03/19 0756, 200 mcg/hr at 08/03/19 0756   hydrALAZINE (APRESOLINE) injection 10 mg, 10 mg, Intravenous, Q4H PRN, Tukov-Yual, Magdalene S, NP, 10 mg at 08/02/19 1707   hydrALAZINE (APRESOLINE) tablet 25 mg, 25 mg, Oral, Q8H, Tukov-Yual, Magdalene S, NP, 25 mg at 08/03/19 0619211hydrochlorothiazide (HYDRODIURIL) tablet 50 mg, 50 mg, Per Tube, Daily, AleLanney Ginsuad, MD   HYDROmorphone (DILAUDID) injection 1 mg, 1 mg, Intravenous, Once, IsaDuffy BruceD   LORazepam (ATIVAN) injection 2-4 mg, 2-4 mg, Intravenous, Q1H PRN, KasFlora LippsD, 2 mg at 07/28/19 1809   MEDLINE mouth rinse, 15 mL, Mouth Rinse, 10 times per day, KasFlora LippsD, 15 mL at 08/03/19 0949   [START ON 08/04/2019] methimazole (TAPAZOLE) tablet 5 mg, 5 mg, Per Tube, Daily, AleOttie GlazierD   multivitamin liquid 15 mL, 15 mL, Per Tube, Daily, KasMortimer Friesurian, MD, 15 mL at 08/03/19 0948   ondansetron (ZOFRAN) injection 4 mg, 4 mg, Intravenous, Q6H PRN, KasMortimer Friesurian, MD   polyethylene glycol  (MIRALAX / GLYCOLAX) packet 17 g, 17 g, Oral, Daily PRN, KasMortimer FriesurMaretta BeesD   polyethylene glycol (MIRALAX / GLYCOLAX) packet 17 g, 17 g, Oral, Daily, Kasa, Kurian, MD, 17 g at 08/03/19 0948   propofol (DIPRIVAN) 1000 MG/100ML infusion, 0-50 mcg/kg/min, Intravenous, Continuous, Kasa, Kurian, MD, Last Rate: 22.3 mL/hr at 08/03/19 0818, 40 mcg/kg/min at 08/03/19 0818   sodium chloride flush (NS) 0.9 % injection 3 mL, 3 mL, Intravenous, Q12H, Kasa, Kurian, MD, 3 mL at 08/03/19 0949   sodium chloride flush (NS) 0.9 % injection 3 mL, 3 mL, Intravenous, PRN, KasFlora LippsD    ALLERGIES   Lisinopril    REVIEW OF SYSTEMS     Unable to obtain due to sedation with MV  PHYSICAL EXAMINATION   Vital Signs: Temp:  [98.2 F (36.8 C)-99.1 F (37.3 C)] 98.4 F (36.9 C) (07/22 0600) Pulse Rate:  [  33-62] 44 (07/22 0800) Resp:  [15-16] 15 (07/22 0800) BP: (124-179)/(47-61) 176/53 (07/22 0800) SpO2:  [97 %-100 %] 99 % (07/22 0800) FiO2 (%):  [28 %] 28 % (07/22 0743) Weight:  [93.6 kg] 93.6 kg (07/22 0500)  GENERAL:age appropriate on MV HEAD: Normocephalic, atraumatic.  EYES: Pupils equal, round, reactive to light.  No scleral icterus. Periorbital swelling+ MOUTH: Moist mucosal membrane. Severely swollen lips and protrusion of swollen tongue NECK: Supple. No thyromegaly. No nodules. No JVD.  PULMONARY: mildly rhonchorous breath sounds with MV in background CARDIOVASCULAR: S1 and S2. Regular rate and rhythm. No murmurs, rubs, or gallops.  GASTROINTESTINAL: Soft, nontender, non-distended. No masses. Positive bowel sounds. No hepatosplenomegaly.  MUSCULOSKELETAL: No swelling, clubbing, or edema.  NEUROLOGIC: GCS4T SKIN:intact,warm,dry   PERTINENT DATA     Infusions:  sodium chloride     ampicillin-sulbactam (UNASYN) IV 3 g (08/03/19 0615)   famotidine (PEPCID) IV Stopped (08/02/19 1110)   fentaNYL infusion INTRAVENOUS 200 mcg/hr (08/03/19 0756)   propofol (DIPRIVAN) infusion  40 mcg/kg/min (08/03/19 0818)   Scheduled Medications:  sodium chloride   Intravenous Once   chlorhexidine gluconate (MEDLINE KIT)  15 mL Mouth Rinse BID   Chlorhexidine Gluconate Cloth  6 each Topical Daily   dexamethasone (DECADRON) injection  4 mg Intravenous Q6H   diphenhydrAMINE  50 mg Intravenous Q8H   docusate  100 mg Oral BID   enoxaparin (LOVENOX) injection  40 mg Subcutaneous Q24H   feeding supplement (PRO-STAT 64)  30 mL Per Tube TID   feeding supplement (VITAL HIGH PROTEIN)  1,000 mL Per Tube Q24H   fentaNYL (SUBLIMAZE) injection  25 mcg Intravenous Once   fentaNYL (SUBLIMAZE) injection  50 mcg Intravenous Once   fentaNYL (SUBLIMAZE) injection  75 mcg Intravenous Once   hydrALAZINE  25 mg Oral Q8H   hydrochlorothiazide  50 mg Per Tube Daily    HYDROmorphone (DILAUDID) injection  1 mg Intravenous Once   mouth rinse  15 mL Mouth Rinse 10 times per day   [START ON 08/04/2019] methimazole  5 mg Per Tube Daily   multivitamin  15 mL Per Tube Daily   polyethylene glycol  17 g Oral Daily   sodium chloride flush  3 mL Intravenous Q12H   PRN Medications: sodium chloride, acetaminophen, docusate sodium, fentaNYL, fentaNYL, fentaNYL (SUBLIMAZE) injection, fentaNYL (SUBLIMAZE) injection, hydrALAZINE, LORazepam, ondansetron (ZOFRAN) IV, polyethylene glycol, sodium chloride flush Hemodynamic parameters:   Intake/Output: 07/21 0701 - 07/22 0700 In: 1445.2 [I.V.:1095.2; IV Piggyback:350] Out: 9407 [Urine:1150]  Ventilator  Settings: Vent Mode: PRVC FiO2 (%):  [28 %] 28 % Set Rate:  [15 bmp] 15 bmp Vt Set:  [500 mL] 500 mL PEEP:  [5 cmH20] 5 cmH20 Plateau Pressure:  [19 cmH20] 19 cmH20    LAB RESULTS:  Basic Metabolic Panel: Recent Labs  Lab 07/30/19 0315 07/30/19 0315 07/31/19 0434 07/31/19 0434 08/01/19 0411 08/01/19 0411 08/02/19 0422 08/03/19 0417  NA 137  --  139  --  141  --  141 144  K 3.4*   < > 3.4*   < > 3.5   < > 3.7 3.8  CL 106  --   110  --  111  --  110 114*  CO2 22  --  22  --  24  --  23 23  GLUCOSE 154*  --  136*  --  140*  --  146* 146*  BUN 23  --  27*  --  38*  --  43* 50*  CREATININE 1.64*  --  1.31*  --  1.15*  --  1.05* 1.05*  CALCIUM 8.4*  --  8.2*  --  8.3*  --  8.4* 8.2*  MG 1.9  --  2.1  --  2.4  --  2.7* 3.0*  PHOS 3.7  --  4.0  --  3.4  --  3.1 2.8   < > = values in this interval not displayed.   Liver Function Tests: Recent Labs  Lab 07/31/19 0434 08/01/19 0411 08/02/19 0422 08/03/19 0417  ALBUMIN 3.0* 2.7* 2.7* 2.3*   No results for input(s): LIPASE, AMYLASE in the last 168 hours. No results for input(s): AMMONIA in the last 168 hours. CBC: Recent Labs  Lab 07/30/19 0315 07/31/19 0434 08/01/19 0411 08/02/19 0422 08/03/19 0417  WBC 12.5* 9.6 10.6* 12.4* 13.6*  NEUTROABS  --  8.4* 8.9* 10.3* 10.8*  HGB 12.3 12.5 13.0 13.2 13.0  HCT 34.8* 38.1 37.1 39.1 38.6  MCV 84.3 87.6 84.9 85.7 86.2  PLT 131* 124* 118* 116* 120*   Cardiac Enzymes: No results for input(s): CKTOTAL, CKMB, CKMBINDEX, TROPONINI in the last 168 hours. BNP: Invalid input(s): POCBNP CBG: No results for input(s): GLUCAP in the last 168 hours.     IMAGING RESULTS:  Imaging: No results found. @PROBHOSP @ No results found.   ASSESSMENT AND PLAN    -Multidisciplinary rounds held today  Acute angioedema  - presumably due to ACE inhibitor  - patient has been on steroids and H2 blockade for 4 days with lilttle improvement, have discussed with family regarding FFP and consent has been obtained - Add diphenhydramine 50q8h - reduce sedation as able to RASS- 0 -continue Full MV support -continue Bronchodilator Therapy -Wean Fio2 and PEEP as tolerated -will perform SAT/SBT when respiratory parameters are met   Bradyarrythmia   - possible due to clonidine although patient takes this on outpatient   - will d/c for now as patient is also on Coreg now -oxygen as needed ICU telemetry monitoring  Accelerated  hypertension    - will add HCTZ -d/c clonidine  - will start additional PO if necessary   GI/Nutrition GI PROPHYLAXIS as indicated-Orogastric tube feeding intiated - discussed with Jacklynn Barnacle RD DIET-->TF's as tolerated Constipation protocol as indicated  ENDO - ICU hypoglycemic\Hyperglycemia protocol -check FSBS per protocol   ELECTROLYTES -follow labs as needed -replace as needed -pharmacy consultation   DVT/GI PRX ordered -SCDs  TRANSFUSIONS AS NEEDED MONITOR FSBS ASSESS the need for LABS as needed   Critical care provider statement:    Critical care time (minutes):  109   Critical care time was exclusive of:  Separately billable procedures and treating other patients   Critical care was necessary to treat or prevent imminent or life-threatening deterioration of the following conditions:  angioedema, accelerated HTN,    Critical care was time spent personally by me on the following activities:  Development of treatment plan with patient or surrogate, discussions with consultants, evaluation of patient's response to treatment, examination of patient, obtaining history from patient or surrogate, ordering and performing treatments and interventions, ordering and review of laboratory studies and re-evaluation of patient's condition.  I assumed direction of critical care for this patient from another provider in my specialty: no    This document was prepared using Dragon voice recognition software and may include unintentional dictation errors.    Ottie Glazier, M.D.  Division of [place]

## 2019-08-03 NOTE — Progress Notes (Signed)
Nutrition Follow-up  DOCUMENTATION CODES:   Obesity unspecified  INTERVENTION:  Initiate new goal TF regimen of Vital High Protein at 20 mL/hr (480 mL goal daily volume) + Pro-Stat 60 mL BID per tube. Provides 880 kcal, 102 grams of protein, 403 mL H2O daily. With current propofol rate provides 1469 kcal daily.  Provide MVI daily per tube.  NUTRITION DIAGNOSIS:   Inadequate oral intake related to inability to eat (pt sedated and ventilated) as evidenced by NPO status.  Ongoing - addressing with TF regimen.  GOAL:   Provide needs based on ASPEN/SCCM guidelines  Met with TF regimen.  MONITOR:   Vent status, Labs, Weight trends, Skin, I & O's, TF tolerance  REASON FOR ASSESSMENT:   Ventilator    ASSESSMENT:   85 y/o female with h/o HTN, CHF, CAD, dementia, MI and hyperthyroidism who is admitted with angioedema requiring intubation.  Patient is currently intubated on ventilator support MV: 7.4 L/min Temp (24hrs), Avg:98.6 F (37 C), Min:98.2 F (36.8 C), Max:99.1 F (37.3 C)  Propofol: 22.3 ml/hr (589 kcal daily)  Medications reviewed and include: Decadron 4 mg Q6hrs IV, Benadryl 50 mg Q8hrs IV, Colace 100 mg BID, liquid MVI daily, Miralax, Unasyn, famotidine, fentanyl gtt, propofol gtt.  Labs reviewed: Chloride 114, BUN 50, Creatinine 1.05, Magnesium 3.  Enteral Access: OGT placed 7/16; tip terminates below level of imaging and beyond GE junction per chest x-ray 7/18  TF Regimen: Vital High Protein at 20 mL/hr + Pro-Stat 30 mL TID per tube  I/O: 1150 mL UOP yesterday (0.5 mL/kg/hr)  Weight trend: 93.6 kg on 7/22; +3.9 kg from 7/16  Discussed with RN and on rounds. Patient tolerating tube feed regimen.  Diet Order:   Diet Order            Diet NPO time specified  Diet effective now                EDUCATION NEEDS:   No education needs have been identified at this time  Skin:  Skin Assessment: Reviewed RN Assessment  Last BM:  08/01/2019  Height:    Ht Readings from Last 1 Encounters:  08/02/19 5' 2.01" (1.575 m)   Weight:   Wt Readings from Last 1 Encounters:  08/03/19 93.6 kg   Ideal Body Weight:  50 kg  BMI:  Body mass index is 37.73 kg/m.  Estimated Nutritional Needs:   Kcal:  980-1247kcal/day  Protein:  >100g/day  Fluid:  1.5L/day  Jacklynn Barnacle, MS, RD, LDN Pager number available on Amion

## 2019-08-04 LAB — MAGNESIUM: Magnesium: 3.1 mg/dL — ABNORMAL HIGH (ref 1.7–2.4)

## 2019-08-04 LAB — RENAL FUNCTION PANEL
Albumin: 2.3 g/dL — ABNORMAL LOW (ref 3.5–5.0)
Anion gap: 9 (ref 5–15)
BUN: 56 mg/dL — ABNORMAL HIGH (ref 8–23)
CO2: 23 mmol/L (ref 22–32)
Calcium: 8.4 mg/dL — ABNORMAL LOW (ref 8.9–10.3)
Chloride: 111 mmol/L (ref 98–111)
Creatinine, Ser: 1.03 mg/dL — ABNORMAL HIGH (ref 0.44–1.00)
GFR calc Af Amer: 59 mL/min — ABNORMAL LOW (ref >60)
GFR calc non Af Amer: 51 mL/min — ABNORMAL LOW (ref >60)
Glucose, Bld: 147 mg/dL — ABNORMAL HIGH (ref 70–99)
Phosphorus: 3.3 mg/dL (ref 2.5–4.6)
Potassium: 4.1 mmol/L (ref 3.5–5.1)
Sodium: 143 mmol/L (ref 135–145)

## 2019-08-04 LAB — PREPARE FRESH FROZEN PLASMA

## 2019-08-04 LAB — CBC WITH DIFFERENTIAL/PLATELET
Abs Immature Granulocytes: 0.99 10*3/uL — ABNORMAL HIGH (ref 0.00–0.07)
Basophils Absolute: 0.1 10*3/uL (ref 0.0–0.1)
Basophils Relative: 0 %
Eosinophils Absolute: 0 10*3/uL (ref 0.0–0.5)
Eosinophils Relative: 0 %
HCT: 36.6 % (ref 36.0–46.0)
Hemoglobin: 12.4 g/dL (ref 12.0–15.0)
Immature Granulocytes: 6 %
Lymphocytes Relative: 8 %
Lymphs Abs: 1.2 10*3/uL (ref 0.7–4.0)
MCH: 29.5 pg (ref 26.0–34.0)
MCHC: 33.9 g/dL (ref 30.0–36.0)
MCV: 87.1 fL (ref 80.0–100.0)
Monocytes Absolute: 1.9 10*3/uL — ABNORMAL HIGH (ref 0.1–1.0)
Monocytes Relative: 12 %
Neutro Abs: 11.3 10*3/uL — ABNORMAL HIGH (ref 1.7–7.7)
Neutrophils Relative %: 74 %
Platelets: 130 10*3/uL — ABNORMAL LOW (ref 150–400)
RBC: 4.2 MIL/uL (ref 3.87–5.11)
RDW: 13.6 % (ref 11.5–15.5)
Smear Review: NORMAL
WBC: 15.2 10*3/uL — ABNORMAL HIGH (ref 4.0–10.5)
nRBC: 0 % (ref 0.0–0.2)

## 2019-08-04 LAB — TRIGLYCERIDES: Triglycerides: 97 mg/dL (ref <150)

## 2019-08-04 LAB — CREATININE, SERUM
Creatinine, Ser: 1.09 mg/dL — ABNORMAL HIGH (ref 0.44–1.00)
GFR calc Af Amer: 55 mL/min — ABNORMAL LOW (ref >60)
GFR calc non Af Amer: 47 mL/min — ABNORMAL LOW (ref >60)

## 2019-08-04 LAB — BPAM FFP
Blood Product Expiration Date: 202107272359
ISSUE DATE / TIME: 202107221256
Unit Type and Rh: 5100

## 2019-08-04 LAB — C1 ESTERASE INHIBITOR: C1INH SerPl-mCnc: 41 mg/dL — ABNORMAL HIGH (ref 21–39)

## 2019-08-04 MED ORDER — HYDROCHLOROTHIAZIDE 50 MG PO TABS
100.0000 mg | ORAL_TABLET | Freq: Every day | ORAL | Status: DC
  Administered 2019-08-05 – 2019-08-11 (×7): 100 mg
  Filled 2019-08-04: qty 2
  Filled 2019-08-04: qty 4
  Filled 2019-08-04: qty 2
  Filled 2019-08-04 (×3): qty 4
  Filled 2019-08-04 (×4): qty 2
  Filled 2019-08-04: qty 4
  Filled 2019-08-04: qty 2

## 2019-08-04 MED ORDER — HYDROCHLOROTHIAZIDE 50 MG PO TABS
50.0000 mg | ORAL_TABLET | Freq: Once | ORAL | Status: AC
  Administered 2019-08-04: 50 mg
  Filled 2019-08-04: qty 2
  Filled 2019-08-04: qty 1

## 2019-08-04 MED ORDER — SODIUM CHLORIDE 0.9% IV SOLUTION: Freq: Once | INTRAVENOUS | Status: DC

## 2019-08-04 NOTE — Progress Notes (Signed)
CRITICAL CARE PROGRESS NOTE    Name: Melanie Lewis MRN: [id number] DOB: December 22, 1934     LOS: 7   SUBJECTIVE FINDINGS & SIGNIFICANT EVENTS    Patient description:   85 yo F with PMH as below came in with chief complaint of difficulty articulating and SOB with swelling of face/lips and throat. She was intubated due to angioedema, this was thought to be due to lisinopril.    08/03/19- Daughter [name] is at bedside, we discussed hospital course and care plan together. Questions answered. 08/04/19- patient is s/p FFP with significant improvement of severely swollen lips/tongue.   Lines/tubes : Airway 7 mm (Active)  Secured at (cm) 24 cm 08/03/19 0804  Measured From Lips 08/03/19 [place] 08/03/19 0804  Secured By [employer] 08/03/19 0804  Tube Holder Repositioned Yes 08/03/19 0743  Cuff Pressure (cm H2O) 28 cm H2O 08/03/19 0743  Site Condition Cool;Dry;Edema 08/03/19 0743     NG/OG Tube Orogastric Right mouth Xray Documented cm marking at nare/ corner of mouth 60 cm (Active)  Cm Marking at Nare/Corner of Mouth (if applicable) 60 cm 56/25/63 0804  Site Assessment Clean;Dry;Intact 08/03/19 0804  Ongoing Placement Verification No change in cm markings or external length of tube from initial placement;No change in respiratory status 08/03/19 0804  Status Infusing tube feed 08/03/19 0804  Drainage Appearance None 08/02/19 2000  Intake (mL) 30 mL 07/31/19 0658  Output (mL) 0 mL 08/02/19 1557     Urethral Catheter Dee RN  Temperature probe 14 Fr. (Active)  Indication for Insertion or Continuance of Catheter Therapy based on hourly urine output monitoring and documentation for critical condition (NOT STRICT I&O) 08/03/19 0723  Site Assessment Clean;Intact 08/03/19 0723   Catheter Maintenance Bag below level of bladder;Catheter secured;Drainage bag/tubing not touching floor;Insertion date on drainage bag;No dependent loops;Seal intact;Bag emptied prior to transport 08/03/19 0723  Collection Container Standard drainage bag 08/03/19 0723  Securement Method Securing device (Describe) 08/03/19 0723  Urinary Catheter Interventions (if applicable) Unclamped 89/37/34 0723  Output (mL) 175 mL 08/03/19 0436    Microbiology/Sepsis markers: Results for orders placed or performed during the hospital encounter of 07/28/19  SARS Coronavirus 2 by RT PCR (hospital order, performed in [facility] hospital lab) Nasopharyngeal Nasopharyngeal Swab     Status: None   Collection Time: 07/28/19  3:43 PM   Specimen: Nasopharyngeal Swab  Result Value Ref Range Status   SARS Coronavirus 2 NEGATIVE NEGATIVE Final    Comment: (NOTE) SARS-CoV-2 target nucleic acids are NOT DETECTED.  The SARS-CoV-2 RNA is generally detectable in upper and lower respiratory specimens during the acute phase of infection. The lowest concentration of SARS-CoV-2 viral copies this assay can detect is 250 copies / mL. A negative result does not preclude SARS-CoV-2 infection and should not be used as the sole basis for treatment or other patient management decisions.  A negative result may occur with improper specimen collection / handling, submission of specimen other than nasopharyngeal swab, presence of viral mutation(s) within the areas targeted by this assay, and inadequate number of viral copies (<250 copies / mL). A negative result must be combined with clinical observations, patient history, and epidemiological information.  Fact Sheet for Patients:   StrictlyIdeas.no  Fact Sheet for Healthcare Providers: BankingDealers.co.za  This test is not yet approved or  cleared by the [place] FDA and has been authorized for detection and/or diagnosis  of SARS-CoV-2 by FDA under an Emergency Use Authorization (EUA).  This EUA will remain in effect (meaning this test can be used) for the duration of the COVID-19 declaration under Section 564(b)(1) of the Act, 21 U.S.C. section 360bbb-3(b)(1), unless the authorization is terminated or revoked sooner.  Performed at [facility], [address] [place], [place] [address]   MRSA PCR Screening     Status: None   Collection Time: 07/28/19  5:46 PM   Specimen: Nasal Mucosa; Nasopharyngeal  Result Value Ref Range Status   MRSA by PCR NEGATIVE NEGATIVE Final    Comment:        The GeneXpert MRSA Assay (FDA approved for NASAL specimens only), is one component of a comprehensive MRSA colonization surveillance program. It is not intended to diagnose MRSA infection nor to guide or monitor treatment for MRSA infections. Performed at [facility], [address] [place],  [address]     Anti-infectives:  Anti-infectives (From admission, onward)   Start     Dose/Rate Route Frequency Ordered Stop   08/02/19 1200  Ampicillin-Sulbactam (UNASYN) 3 g in sodium chloride 0.9 % 100 mL IVPB     Discontinue     3 g 200 mL/hr over 30 Minutes Intravenous Every 6 hours 08/02/19 1014          Tests / Events: C1esterase deficiency    PAST MEDICAL HISTORY   Past Medical History:  Diagnosis Date  . CHF (congestive heart failure) ([place])   . Coronary artery disease   . Dementia ([place])   . Heart attack ([place])   . Hypertension   . hyperthyroid   . Hyperthyroidism   . MI (myocardial infarction) [facility])      SURGICAL HISTORY   Past Surgical History:  Procedure Laterality Date  . CARDIAC CATHETERIZATION N/A 08/14/2014   Procedure: Left Heart Cath and Coronary Angiography;  Surgeon: Isaias Cowman, MD;  Location: [facility];  Service: Cardiovascular;  Laterality: N/A;  . CHOLECYSTECTOMY    . PARTIAL HYSTERECTOMY       FAMILY HISTORY   Family  History  Problem Relation Age of Onset  . Heart attack Sister   . Hypertension Sister   . Dementia Sister   . Heart attack Son   . Dementia Sister      SOCIAL HISTORY   Social History   Tobacco Use  . Smoking status: Never Smoker  . Smokeless tobacco: Never Used  Substance Use Topics  . Alcohol use: Yes    Alcohol/week: 2.0 standard drinks    Types: 2 Shots of liquor per week    Comment: occasional  . Drug use: No     MEDICATIONS   Current Medication:  Current Facility-Administered Medications:  .  0.9 %  sodium chloride infusion (Manually program via Guardrails IV Fluids), , Intravenous, Once, Duffy Bruce, MD .  0.9 %  sodium chloride infusion, 250 mL, Intravenous, PRN, Flora Lipps, MD .  acetaminophen (TYLENOL) tablet 650 mg, 650 mg, Oral, Q4H PRN, Mortimer Fries, Kurian, MD .  Ampicillin-Sulbactam (UNASYN) 3 g in sodium chloride 0.9 % 100 mL IVPB, 3 g, Intravenous, Q6H, Kasa, Kurian, MD, Last Rate: 100 mL/hr at 08/04/19 0611, 3 g at 08/04/19 0611 .  chlorhexidine gluconate (MEDLINE KIT) (PERIDEX) 0.12 % solution 15 mL, 15 mL, Mouth Rinse, BID, Kasa, Kurian, MD, 15 mL at 08/04/19 0743 .  Chlorhexidine Gluconate Cloth 2 % PADS 6 each, 6 each, Topical, Daily, Tukov-Yual, Magdalene S, NP, 6 each at 08/04/19 0600 .  dexamethasone (DECADRON) injection 4 mg, 4 mg,  Intravenous, Q6H, Flora Lipps, MD, 4 mg at 08/04/19 0523 .  diphenhydrAMINE (BENADRYL) injection 50 mg, 50 mg, Intravenous, Q8H, Kassiah Mccrory, MD, 50 mg at 08/04/19 0419 .  docusate (COLACE) 50 MG/5ML liquid 100 mg, 100 mg, Oral, BID, Kasa, Kurian, MD, 100 mg at 08/04/19 0908 .  docusate sodium (COLACE) capsule 100 mg, 100 mg, Oral, BID PRN, Mortimer Fries, Kurian, MD .  enoxaparin (LOVENOX) injection 40 mg, 40 mg, Subcutaneous, Q24H, Kasa, Kurian, MD, 40 mg at 08/03/19 1621 .  famotidine (PEPCID) IVPB 20 mg premix, 20 mg, Intravenous, Q24H, Kasa, Kurian, MD, Last Rate: 100 mL/hr at 08/03/19 1229, 20 mg at 08/03/19 1229 .   feeding supplement (PRO-STAT 64) liquid 60 mL, 60 mL, Per Tube, BID, Hassel Uphoff, MD, 60 mL at 08/04/19 0908 .  feeding supplement (VITAL HIGH PROTEIN) liquid 1,000 mL, 1,000 mL, Per Tube, Q24H, Kasa, Kurian, MD, 1,000 mL at 08/03/19 1608 .  fentaNYL (SUBLIMAZE) bolus via infusion 25 mcg, 25 mcg, Intravenous, Q1H PRN, Duffy Bruce, MD, 25 mcg at 07/31/19 0440 .  fentaNYL (SUBLIMAZE) bolus via infusion 25 mcg, 25 mcg, Intravenous, Q15 min PRN, Mortimer Fries, Kurian, MD .  fentaNYL (SUBLIMAZE) injection 25 mcg, 25 mcg, Intravenous, Once, Kasa, Kurian, MD .  fentaNYL (SUBLIMAZE) injection 50 mcg, 50 mcg, Intravenous, Q15 min PRN, Duffy Bruce, MD, 50 mcg at 07/28/19 1521 .  fentaNYL (SUBLIMAZE) injection 50 mcg, 50 mcg, Intravenous, Q2H PRN, Duffy Bruce, MD .  fentaNYL (SUBLIMAZE) injection 50 mcg, 50 mcg, Intravenous, Once, Duffy Bruce, MD .  fentaNYL (SUBLIMAZE) injection 75 mcg, 75 mcg, Intravenous, Once, Duffy Bruce, MD .  fentaNYL 2511mg in NS 2534m(1033mml) infusion-PREMIX, 25-200 mcg/hr, Intravenous, Continuous, Kasa, Kurian, MD, Last Rate: 20 mL/hr at 08/04/19 0919, 200 mcg/hr at 08/04/19 0919 .  hydrALAZINE (APRESOLINE) injection 10 mg, 10 mg, Intravenous, Q4H PRN, Tukov-Yual, Magdalene S, NP, 10 mg at 08/04/19 0419 .  hydrALAZINE (APRESOLINE) tablet 25 mg, 25 mg, Oral, Q8H, Tukov-Yual, Magdalene S, NP, 25 mg at 08/04/19 0523 .  [START ON 08/05/2019] hydrochlorothiazide (HYDRODIURIL) tablet 100 mg, 100 mg, Per Tube, Daily, Carlen Rebuck, MD .  HYDROmorphone (DILAUDID) injection 1 mg, 1 mg, Intravenous, Once, IsaDuffy BruceD .  LORazepam (ATIVAN) injection 2-4 mg, 2-4 mg, Intravenous, Q1H PRN, KasFlora LippsD, 2 mg at 07/28/19 1809 .  MEDLINE mouth rinse, 15 mL, Mouth Rinse, 10 times per day, KasFlora LippsD, 15 mL at 08/04/19 0908 .  methimazole (TAPAZOLE) tablet 5 mg, 5 mg, Per Tube, Daily, AleOttie GlazierD, 5 mg at 08/04/19 0911 .  multivitamin with minerals tablet  1 tablet, 1 tablet, Per Tube, Daily, AleOttie GlazierD, 1 tablet at 08/04/19 0908 .  ondansetron (ZOFRAN) injection 4 mg, 4 mg, Intravenous, Q6H PRN, KasMortimer Friesurian, MD .  polyethylene glycol (MIRALAX / GLYCOLAX) packet 17 g, 17 g, Oral, Daily PRN, KasMortimer Friesurian, MD .  polyethylene glycol (MIRALAX / GLYCOLAX) packet 17 g, 17 g, Oral, Daily, Kasa, Kurian, MD, 17 g at 08/04/19 0908 .  propofol (DIPRIVAN) 1000 MG/100ML infusion, 0-50 mcg/kg/min, Intravenous, Continuous, Kasa, Kurian, MD, Last Rate: 22.3 mL/hr at 08/04/19 0934, 40 mcg/kg/min at 08/04/19 0934 .  sodium chloride flush (NS) 0.9 % injection 3 mL, 3 mL, Intravenous, Q12H, Kasa, Kurian, MD, 3 mL at 08/04/19 0909 .  sodium chloride flush (NS) 0.9 % injection 3 mL, 3 mL, Intravenous, PRN, KasFlora LippsD    ALLERGIES   Lisinopril    REVIEW OF SYSTEMS     Unable  to obtain due to sedation with MV  PHYSICAL EXAMINATION   Vital Signs: Temp:  [98.2 F (36.8 C)-98.8 F (37.1 C)] 98.2 F (36.8 C) (07/23 0700) Pulse Rate:  [42-63] 50 (07/23 0700) Resp:  [14-15] 15 (07/23 0700) BP: (124-207)/(38-60) 173/52 (07/23 0700) SpO2:  [96 %-99 %] 96 % (07/23 0804) FiO2 (%):  [21 %] 21 % (07/23 0804) Weight:  [93.9 kg] 93.9 kg (07/23 0400)  GENERAL:age appropriate on MV HEAD: Normocephalic, atraumatic.  EYES: Pupils equal, round, reactive to light.  No scleral icterus. Periorbital swelling+ MOUTH: Moist mucosal membrane. Severely swollen lips and protrusion of swollen tongue NECK: Supple. No thyromegaly. No nodules. No JVD.  PULMONARY: mildly rhonchorous breath sounds with MV in background CARDIOVASCULAR: S1 and S2. Regular rate and rhythm. No murmurs, rubs, or gallops.  GASTROINTESTINAL: Soft, nontender, non-distended. No masses. Positive bowel sounds. No hepatosplenomegaly.  MUSCULOSKELETAL: No swelling, clubbing, or edema.  NEUROLOGIC: GCS4T SKIN:intact,warm,dry   PERTINENT DATA     Infusions: . sodium chloride    .  ampicillin-sulbactam (UNASYN) IV 3 g (08/04/19 6789)  . famotidine (PEPCID) IV 20 mg (08/03/19 1229)  . fentaNYL infusion INTRAVENOUS 200 mcg/hr (08/04/19 0919)  . propofol (DIPRIVAN) infusion 40 mcg/kg/min (08/04/19 0934)   Scheduled Medications: . sodium chloride   Intravenous Once  . chlorhexidine gluconate (MEDLINE KIT)  15 mL Mouth Rinse BID  . Chlorhexidine Gluconate Cloth  6 each Topical Daily  . dexamethasone (DECADRON) injection  4 mg Intravenous Q6H  . diphenhydrAMINE  50 mg Intravenous Q8H  . docusate  100 mg Oral BID  . enoxaparin (LOVENOX) injection  40 mg Subcutaneous Q24H  . feeding supplement (PRO-STAT 64)  60 mL Per Tube BID  . feeding supplement (VITAL HIGH PROTEIN)  1,000 mL Per Tube Q24H  . fentaNYL (SUBLIMAZE) injection  25 mcg Intravenous Once  . fentaNYL (SUBLIMAZE) injection  50 mcg Intravenous Once  . fentaNYL (SUBLIMAZE) injection  75 mcg Intravenous Once  . hydrALAZINE  25 mg Oral Q8H  . [START ON 08/05/2019] hydrochlorothiazide  100 mg Per Tube Daily  .  HYDROmorphone (DILAUDID) injection  1 mg Intravenous Once  . mouth rinse  15 mL Mouth Rinse 10 times per day  . methimazole  5 mg Per Tube Daily  . multivitamin with minerals  1 tablet Per Tube Daily  . polyethylene glycol  17 g Oral Daily  . sodium chloride flush  3 mL Intravenous Q12H   PRN Medications: sodium chloride, acetaminophen, docusate sodium, fentaNYL, fentaNYL, fentaNYL (SUBLIMAZE) injection, fentaNYL (SUBLIMAZE) injection, hydrALAZINE, LORazepam, ondansetron (ZOFRAN) IV, polyethylene glycol, sodium chloride flush Hemodynamic parameters:   Intake/Output: 07/22 0701 - 07/23 0700 In: 1595.3 [I.V.:926; Blood:214.3; IV Piggyback:455.1] Out: 2365 [Urine:2365]  Ventilator  Settings: Vent Mode: PRVC FiO2 (%):  [21 %] 21 % Set Rate:  [15 bmp] 15 bmp Vt Set:  [500 mL] 500 mL PEEP:  [5 cmH20] 5 cmH20 Plateau Pressure:  [18 cmH20-20 cmH20] 18 cmH20    LAB RESULTS:  Basic Metabolic  Panel: Recent Labs  Lab 07/31/19 0434 07/31/19 0434 08/01/19 0411 08/01/19 0411 08/02/19 0422 08/02/19 0422 08/03/19 0417 08/04/19 0532  NA 139  --  141  --  141  --  144 143  K 3.4*   < > 3.5   < > 3.7   < > 3.8 4.1  CL 110  --  111  --  110  --  114* 111  CO2 22  --  24  --  23  --  23 23  GLUCOSE 136*  --  140*  --  146*  --  146* 147*  BUN 27*  --  38*  --  43*  --  50* 56*  CREATININE 1.31*  --  1.15*  --  1.05*  --  1.05* 1.03*  1.09*  CALCIUM 8.2*  --  8.3*  --  8.4*  --  8.2* 8.4*  MG 2.1  --  2.4  --  2.7*  --  3.0* 3.1*  PHOS 4.0  --  3.4  --  3.1  --  2.8 3.3   < > = values in this interval not displayed.   Liver Function Tests: Recent Labs  Lab 07/31/19 0434 08/01/19 0411 08/02/19 0422 08/03/19 0417 08/04/19 0532  ALBUMIN 3.0* 2.7* 2.7* 2.3* 2.3*   No results for input(s): LIPASE, AMYLASE in the last 168 hours. No results for input(s): AMMONIA in the last 168 hours. CBC: Recent Labs  Lab 07/31/19 0434 08/01/19 0411 08/02/19 0422 08/03/19 0417 08/04/19 0532  WBC 9.6 10.6* 12.4* 13.6* 15.2*  NEUTROABS 8.4* 8.9* 10.3* 10.8* 11.3*  HGB 12.5 13.0 13.2 13.0 12.4  HCT 38.1 37.1 39.1 38.6 36.6  MCV 87.6 84.9 85.7 86.2 87.1  PLT 124* 118* 116* 120* 130*   Cardiac Enzymes: No results for input(s): CKTOTAL, CKMB, CKMBINDEX, TROPONINI in the last 168 hours. BNP: Invalid input(s): POCBNP CBG: No results for input(s): GLUCAP in the last 168 hours.     IMAGING RESULTS:  Imaging: No results found. @PROBHOSP @ No results found.   ASSESSMENT AND PLAN    -Multidisciplinary rounds held today  Acute angioedema  - presumably due to ACE inhibitor  - patient has been on steroids and H2 blockade for 4 days with lilttle improvement, have discussed with family regarding FFP and consent has been obtained - Add diphenhydramine 50q8h -FFP 1 unit -08/04/19 - reduce sedation as able to RASS- 0 -continue Full MV support -continue Bronchodilator Therapy -Wean  Fio2 and PEEP as tolerated -will perform SAT/SBT when respiratory parameters are met   Bradyarrythmia   - possible due to clonidine although patient takes this on outpatient   - will d/c for now as patient is also on Coreg now -oxygen as needed ICU telemetry monitoring  Accelerated hypertension    - will add HCTZ -d/c clonidine  - will start additional PO if necessary   GI/Nutrition GI PROPHYLAXIS as indicated-Orogastric tube feeding intiated - discussed with Jacklynn Barnacle RD DIET-->TF's as tolerated Constipation protocol as indicated  ENDO - ICU hypoglycemic\Hyperglycemia protocol -check FSBS per protocol   ELECTROLYTES -follow labs as needed -replace as needed -pharmacy consultation   DVT/GI PRX ordered -SCDs  TRANSFUSIONS AS NEEDED MONITOR FSBS ASSESS the need for LABS as needed   Critical care provider statement:    Critical care time (minutes):  33   Critical care time was exclusive of:  Separately billable procedures and treating other patients   Critical care was necessary to treat or prevent imminent or life-threatening deterioration of the following conditions:  angioedema, accelerated HTN,    Critical care was time spent personally by me on the following activities:  Development of treatment plan with patient or surrogate, discussions with consultants, evaluation of patient's response to treatment, examination of patient, obtaining history from patient or surrogate, ordering and performing treatments and interventions, ordering and review of laboratory studies and re-evaluation of patient's condition.  I assumed direction of critical care for this patient from another provider in my specialty: no  This document was prepared using Dragon voice recognition software and may include unintentional dictation errors.    Ottie Glazier, M.D.  Division of [place]

## 2019-08-05 ENCOUNTER — Inpatient Hospital Stay: Payer: Medicare Other

## 2019-08-05 LAB — CBC WITH DIFFERENTIAL/PLATELET
Abs Immature Granulocytes: 1.58 10*3/uL — ABNORMAL HIGH (ref 0.00–0.07)
Basophils Absolute: 0.1 10*3/uL (ref 0.0–0.1)
Basophils Relative: 1 %
Eosinophils Absolute: 0 10*3/uL (ref 0.0–0.5)
Eosinophils Relative: 0 %
HCT: 40.2 % (ref 36.0–46.0)
Hemoglobin: 13.5 g/dL (ref 12.0–15.0)
Immature Granulocytes: 11 %
Lymphocytes Relative: 9 %
Lymphs Abs: 1.2 10*3/uL (ref 0.7–4.0)
MCH: 29 pg (ref 26.0–34.0)
MCHC: 33.6 g/dL (ref 30.0–36.0)
MCV: 86.3 fL (ref 80.0–100.0)
Monocytes Absolute: 1.4 10*3/uL — ABNORMAL HIGH (ref 0.1–1.0)
Monocytes Relative: 10 %
Neutro Abs: 10 10*3/uL — ABNORMAL HIGH (ref 1.7–7.7)
Neutrophils Relative %: 69 %
Platelets: 142 10*3/uL — ABNORMAL LOW (ref 150–400)
RBC: 4.66 MIL/uL (ref 3.87–5.11)
RDW: 13.7 % (ref 11.5–15.5)
Smear Review: NORMAL
WBC: 14.5 10*3/uL — ABNORMAL HIGH (ref 4.0–10.5)
nRBC: 0 % (ref 0.0–0.2)

## 2019-08-05 LAB — RENAL FUNCTION PANEL
Albumin: 2.5 g/dL — ABNORMAL LOW (ref 3.5–5.0)
Anion gap: 8 (ref 5–15)
BUN: 56 mg/dL — ABNORMAL HIGH (ref 8–23)
CO2: 25 mmol/L (ref 22–32)
Calcium: 8.7 mg/dL — ABNORMAL LOW (ref 8.9–10.3)
Chloride: 109 mmol/L (ref 98–111)
Creatinine, Ser: 0.91 mg/dL (ref 0.44–1.00)
GFR calc Af Amer: >60 mL/min (ref >60)
GFR calc non Af Amer: 59 mL/min — ABNORMAL LOW (ref >60)
Glucose, Bld: 156 mg/dL — ABNORMAL HIGH (ref 70–99)
Phosphorus: 3.5 mg/dL (ref 2.5–4.6)
Potassium: 4.4 mmol/L (ref 3.5–5.1)
Sodium: 142 mmol/L (ref 135–145)

## 2019-08-05 LAB — BPAM FFP
Blood Product Expiration Date: 202107282359
ISSUE DATE / TIME: 202107231610
Unit Type and Rh: 5100

## 2019-08-05 LAB — TRIGLYCERIDES: Triglycerides: 86 mg/dL (ref <150)

## 2019-08-05 LAB — PREPARE FRESH FROZEN PLASMA: Unit division: 0

## 2019-08-05 LAB — MAGNESIUM: Magnesium: 3.1 mg/dL — ABNORMAL HIGH (ref 1.7–2.4)

## 2019-08-05 MED ORDER — MAGNESIUM SULFATE 2 GM/50ML IV SOLN
2.0000 g | Freq: Once | INTRAVENOUS | Status: AC
  Administered 2019-08-05: 2 g via INTRAVENOUS
  Filled 2019-08-05: qty 50

## 2019-08-05 MED ORDER — POTASSIUM CHLORIDE 10 MEQ/100ML IV SOLN
10.0000 meq | INTRAVENOUS | Status: AC
  Administered 2019-08-05 (×6): 10 meq via INTRAVENOUS
  Filled 2019-08-05 (×6): qty 100

## 2019-08-05 MED ORDER — FUROSEMIDE 10 MG/ML IJ SOLN
4.0000 mg/h | INTRAVENOUS | Status: DC
  Administered 2019-08-05 – 2019-08-06 (×2): 4 mg/h via INTRAVENOUS
  Filled 2019-08-05 (×2): qty 25

## 2019-08-05 MED ORDER — AMLODIPINE BESYLATE 5 MG PO TABS
5.0000 mg | ORAL_TABLET | Freq: Every day | ORAL | Status: DC
  Administered 2019-08-05 – 2019-08-07 (×3): 5 mg via ORAL
  Filled 2019-08-05 (×3): qty 1

## 2019-08-05 MED ORDER — SODIUM CHLORIDE 0.9 % IV SOLN: INTRAVENOUS | Status: DC

## 2019-08-05 MED ORDER — C1 ESTERASE INHIBITOR (HUMAN) 500 UNITS IV KIT
20.0000 [IU]/kg | PACK | Freq: Once | INTRAVENOUS | Status: AC
  Administered 2019-08-05: 2000 [IU] via INTRAVENOUS
  Filled 2019-08-05: qty 2000

## 2019-08-05 MED ORDER — ENOXAPARIN SODIUM 40 MG/0.4ML ~~LOC~~ SOLN
40.0000 mg | Freq: Two times a day (BID) | SUBCUTANEOUS | Status: DC
  Administered 2019-08-05 – 2019-08-08 (×7): 40 mg via SUBCUTANEOUS
  Filled 2019-08-05 (×7): qty 0.4

## 2019-08-05 NOTE — Progress Notes (Signed)
Shift summary:  - Patient remains intubated and sedated this AM.

## 2019-08-05 NOTE — Progress Notes (Signed)
CRITICAL CARE PROGRESS NOTE    Name: Melanie Lewis MRN: [id number] DOB: 1934/03/13     LOS: 8   SUBJECTIVE FINDINGS & SIGNIFICANT EVENTS    Patient description:   85 yo F with PMH as below came in with chief complaint of difficulty articulating and SOB with swelling of face/lips and throat. She was intubated due to angioedema, this was thought to be due to lisinopril.    08/03/19- Daughter [name] is at bedside, we discussed hospital course and care plan together. Questions answered. 08/04/19- patient is s/p FFP with significant improvement of severely swollen lips/tongue. 08/05/19 - patient has small cuff leak this am with overall improvement in a lingual and orofacial swelling however still has significant angioedema.  Lines/tubes : Airway 7 mm (Active)  Secured at (cm) 24 cm 08/03/19 0804  Measured From Lips 08/03/19 [place] 08/03/19 0804  Secured By [employer] 08/03/19 0804  Tube Holder Repositioned Yes 08/03/19 0743  Cuff Pressure (cm H2O) 28 cm H2O 08/03/19 0743  Site Condition Cool;Dry;Edema 08/03/19 0743     NG/OG Tube Orogastric Right mouth Xray Documented cm marking at nare/ corner of mouth 60 cm (Active)  Cm Marking at Nare/Corner of Mouth (if applicable) 60 cm 12/87/86 0804  Site Assessment Clean;Dry;Intact 08/03/19 0804  Ongoing Placement Verification No change in cm markings or external length of tube from initial placement;No change in respiratory status 08/03/19 0804  Status Infusing tube feed 08/03/19 0804  Drainage Appearance None 08/02/19 2000  Intake (mL) 30 mL 07/31/19 0658  Output (mL) 0 mL 08/02/19 1557     Urethral Catheter Dee RN  Temperature probe 14 Fr. (Active)  Indication for Insertion or Continuance of Catheter Therapy based on hourly  urine output monitoring and documentation for critical condition (NOT STRICT I&O) 08/03/19 0723  Site Assessment Clean;Intact 08/03/19 0723  Catheter Maintenance Bag below level of bladder;Catheter secured;Drainage bag/tubing not touching floor;Insertion date on drainage bag;No dependent loops;Seal intact;Bag emptied prior to transport 08/03/19 0723  Collection Container Standard drainage bag 08/03/19 0723  Securement Method Securing device (Describe) 08/03/19 0723  Urinary Catheter Interventions (if applicable) Unclamped 76/72/09 0723  Output (mL) 175 mL 08/03/19 0436    Microbiology/Sepsis markers: Results for orders placed or performed during the hospital encounter of 07/28/19  SARS Coronavirus 2 by RT PCR (hospital order, performed in [facility] hospital lab) Nasopharyngeal Nasopharyngeal Swab     Status: None   Collection Time: 07/28/19  3:43 PM   Specimen: Nasopharyngeal Swab  Result Value Ref Range Status   SARS Coronavirus 2 NEGATIVE NEGATIVE Final    Comment: (NOTE) SARS-CoV-2 target nucleic acids are NOT DETECTED.  The SARS-CoV-2 RNA is generally detectable in upper and lower respiratory specimens during the acute phase of infection. The lowest concentration of SARS-CoV-2 viral copies this assay can detect is 250 copies / mL. A negative result does not preclude SARS-CoV-2 infection and should not be used as the sole basis for treatment or other patient management decisions.  A negative result may occur with improper specimen collection / handling, submission of specimen other than nasopharyngeal swab, presence of viral mutation(s) within the areas targeted by this assay, and inadequate number of viral copies (<250 copies / mL). A negative result must be combined with clinical observations, patient history, and epidemiological information.  Fact Sheet for Patients:   StrictlyIdeas.no  Fact Sheet for Healthcare  Providers: BankingDealers.co.za  This test is not yet approved or  cleared by the  [place] Architectural technologist and has been authorized for detection and/or diagnosis of SARS-CoV-2 by FDA under an Print production planner (EUA).  This EUA will remain in effect (meaning this test can be used) for the duration of the COVID-19 declaration under Section 564(b)(1) of the Act, 21 U.S.C. section 360bbb-3(b)(1), unless the authorization is terminated or revoked sooner.  Performed at [facility], [address] [place], [place] [address]   MRSA PCR Screening     Status: None   Collection Time: 07/28/19  5:46 PM   Specimen: Nasal Mucosa; Nasopharyngeal  Result Value Ref Range Status   MRSA by PCR NEGATIVE NEGATIVE Final    Comment:        The GeneXpert MRSA Assay (FDA approved for NASAL specimens only), is one component of a comprehensive MRSA colonization surveillance program. It is not intended to diagnose MRSA infection nor to guide or monitor treatment for MRSA infections. Performed at [facility], [address] [place], [place] [address]     Anti-infectives:  Anti-infectives (From admission, onward)   Start     Dose/Rate Route Frequency Ordered Stop   08/02/19 1200  Ampicillin-Sulbactam (UNASYN) 3 g in sodium chloride 0.9 % 100 mL IVPB     Discontinue     3 g 200 mL/hr over 30 Minutes Intravenous Every 6 hours 08/02/19 1014          Tests / Events: C1esterase deficiency    PAST MEDICAL HISTORY   Past Medical History:  Diagnosis Date  . CHF (congestive heart failure) ([place])   . Coronary artery disease   . Dementia ([place])   . Heart attack ([place])   . Hypertension   . hyperthyroid   . Hyperthyroidism   . MI (myocardial infarction) [facility])      SURGICAL HISTORY   Past Surgical History:  Procedure Laterality Date  . CARDIAC CATHETERIZATION N/A 08/14/2014   Procedure: Left Heart Cath and Coronary Angiography;  Surgeon: Isaias Cowman, MD;  Location: [facility];  Service: Cardiovascular;  Laterality: N/A;  . CHOLECYSTECTOMY    . PARTIAL HYSTERECTOMY       FAMILY HISTORY   Family History  Problem Relation Age of Onset  . Heart attack Sister   . Hypertension Sister   . Dementia Sister   . Heart attack Son   . Dementia Sister      SOCIAL HISTORY   Social History   Tobacco Use  . Smoking status: Never Smoker  . Smokeless tobacco: Never Used  Substance Use Topics  . Alcohol use: Yes    Alcohol/week: 2.0 standard drinks    Types: 2 Shots of liquor per week    Comment: occasional  . Drug use: No     MEDICATIONS   Current Medication:  Current Facility-Administered Medications:  .  0.9 %  sodium chloride infusion (Manually program via Guardrails IV Fluids), , Intravenous, Once, Duffy Bruce, MD .  0.9 %  sodium chloride infusion (Manually program via Guardrails IV Fluids), , Intravenous, Once, Araina Butrick, MD .  0.9 %  sodium chloride infusion, 250 mL, Intravenous, PRN, Flora Lipps, MD, Last Rate: 5 mL/hr at 08/05/19 0900, Rate Verify at 08/05/19 0900 .  0.9 %  sodium chloride infusion, , Intravenous, Continuous, Breken Nazari, MD .  acetaminophen (TYLENOL) tablet 650 mg, 650 mg, Oral, Q4H PRN, Mortimer Fries, Kurian, MD .  Ampicillin-Sulbactam (UNASYN) 3 g in sodium chloride 0.9 % 100 mL IVPB, 3 g, Intravenous, Q6H, Flora Lipps, MD, Stopped  at 08/05/19 0609 .  C1 esterase inhibitor (Human) (BERINERT) injection 2,000 Units, 20 Units/kg, Intravenous, Once, Quintavious Rinck, MD .  chlorhexidine gluconate (MEDLINE KIT) (PERIDEX) 0.12 % solution 15 mL, 15 mL, Mouth Rinse, BID, Kasa, Kurian, MD, 15 mL at 08/05/19 0709 .  Chlorhexidine Gluconate Cloth 2 % PADS 6 each, 6 each, Topical, Daily, Tukov-Yual, Magdalene S, NP, 6 each at 08/05/19 0533 .  dexamethasone (DECADRON) injection 4 mg, 4 mg, Intravenous, Q6H, Kasa, Kurian, MD, 4 mg at 08/05/19 0533 .  diphenhydrAMINE (BENADRYL) injection 50  mg, 50 mg, Intravenous, Q8H, Girlie Veltri, MD, 50 mg at 08/05/19 0532 .  docusate (COLACE) 50 MG/5ML liquid 100 mg, 100 mg, Oral, BID, Mortimer Fries, Kurian, MD, 100 mg at 08/05/19 0939 .  docusate sodium (COLACE) capsule 100 mg, 100 mg, Oral, BID PRN, Mortimer Fries, Kurian, MD .  enoxaparin (LOVENOX) injection 40 mg, 40 mg, Subcutaneous, Q12H, Ameenah Prosser, MD .  famotidine (PEPCID) IVPB 20 mg premix, 20 mg, Intravenous, Q24H, Flora Lipps, MD, Stopped at 08/04/19 1254 .  feeding supplement (PRO-STAT 64) liquid 60 mL, 60 mL, Per Tube, BID, Ferris Fielden, MD, 60 mL at 08/05/19 0940 .  feeding supplement (VITAL HIGH PROTEIN) liquid 1,000 mL, 1,000 mL, Per Tube, Q24H, Kasa, Kurian, MD, 1,000 mL at 08/04/19 1651 .  fentaNYL (SUBLIMAZE) bolus via infusion 25 mcg, 25 mcg, Intravenous, Q1H PRN, Duffy Bruce, MD, 25 mcg at 07/31/19 0440 .  fentaNYL (SUBLIMAZE) bolus via infusion 25 mcg, 25 mcg, Intravenous, Q15 min PRN, Mortimer Fries, Kurian, MD .  fentaNYL (SUBLIMAZE) injection 25 mcg, 25 mcg, Intravenous, Once, Kasa, Kurian, MD .  fentaNYL (SUBLIMAZE) injection 50 mcg, 50 mcg, Intravenous, Q15 min PRN, Duffy Bruce, MD, 50 mcg at 07/28/19 1521 .  fentaNYL (SUBLIMAZE) injection 50 mcg, 50 mcg, Intravenous, Q2H PRN, Duffy Bruce, MD .  fentaNYL (SUBLIMAZE) injection 50 mcg, 50 mcg, Intravenous, Once, Duffy Bruce, MD .  fentaNYL (SUBLIMAZE) injection 75 mcg, 75 mcg, Intravenous, Once, Duffy Bruce, MD .  fentaNYL 2562mg in NS 2540m(1021mml) infusion-PREMIX, 25-200 mcg/hr, Intravenous, Continuous, Kasa, Kurian, MD, Last Rate: 20 mL/hr at 08/05/19 0900, 200 mcg/hr at 08/05/19 0900 .  furosemide (LASIX) 250 mg in dextrose 5 % 250 mL (1 mg/mL) infusion, 4 mg/hr, Intravenous, Continuous, Khaliyah Northrop, MD .  hydrALAZINE (APRESOLINE) injection 10 mg, 10 mg, Intravenous, Q4H PRN, Tukov-Yual, Magdalene S, NP, 10 mg at 08/05/19 0324 .  hydrALAZINE (APRESOLINE) tablet 25 mg, 25 mg, Oral, Q8H, Tukov-Yual, Magdalene  S, NP, 25 mg at 08/05/19 0532 .  hydrochlorothiazide (HYDRODIURIL) tablet 100 mg, 100 mg, Per Tube, Daily, AleOttie GlazierD, 100 mg at 08/05/19 0946 .  HYDROmorphone (DILAUDID) injection 1 mg, 1 mg, Intravenous, Once, IsaDuffy BruceD .  LORazepam (ATIVAN) injection 2-4 mg, 2-4 mg, Intravenous, Q1H PRN, KasFlora LippsD, 2 mg at 07/28/19 1809 .  magnesium sulfate IVPB 2 g 50 mL, 2 g, Intravenous, Once, Marleah Beever, MD .  MEDLINE mouth rinse, 15 mL, Mouth Rinse, 10 times per day, KasFlora LippsD, 15 mL at 08/05/19 0951 .  methimazole (TAPAZOLE) tablet 5 mg, 5 mg, Per Tube, Daily, AleOttie GlazierD, 5 mg at 08/05/19 0947 .  multivitamin with minerals tablet 1 tablet, 1 tablet, Per Tube, Daily, AleOttie GlazierD, 1 tablet at 08/05/19 0946 .  ondansetron (ZOFRAN) injection 4 mg, 4 mg, Intravenous, Q6H PRN, KasMortimer Friesurian, MD .  polyethylene glycol (MIRALAX / GLYCOLAX) packet 17 g, 17 g, Oral, Daily PRN, KasFlora LippsD .  polyethylene  glycol (MIRALAX / GLYCOLAX) packet 17 g, 17 g, Oral, Daily, Kasa, Kurian, MD, 17 g at 08/05/19 0939 .  potassium chloride 10 mEq in 100 mL IVPB, 10 mEq, Intravenous, Q1 Hr x 6, Kenderick Kobler, MD .  propofol (DIPRIVAN) 1000 MG/100ML infusion, 0-50 mcg/kg/min, Intravenous, Continuous, Kasa, Kurian, MD, Last Rate: 22.3 mL/hr at 08/05/19 0900, 40 mcg/kg/min at 08/05/19 0900 .  sodium chloride flush (NS) 0.9 % injection 3 mL, 3 mL, Intravenous, Q12H, Kasa, Kurian, MD, 3 mL at 08/04/19 2149 .  sodium chloride flush (NS) 0.9 % injection 3 mL, 3 mL, Intravenous, PRN, Mortimer Fries, Kurian, MD    ALLERGIES   Lisinopril    REVIEW OF SYSTEMS     Unable to obtain due to sedation with MV  PHYSICAL EXAMINATION   Vital Signs: Temp:  [98.1 F (36.7 C)-98.8 F (37.1 C)] 98.8 F (37.1 C) (07/24 0900) Pulse Rate:  [47-56] 47 (07/24 0900) Resp:  [15] 15 (07/24 0900) BP: (152-209)/(44-66) 176/52 (07/24 0900) SpO2:  [96 %-100 %] 99 % (07/24 0900) FiO2 (%):  [21 %]  21 % (07/24 0800) Weight:  [90.2 kg] 90.2 kg (07/24 0319)  GENERAL:age appropriate on MV HEAD: Normocephalic, atraumatic.  EYES: Pupils equal, round, reactive to light.  No scleral icterus. Periorbital swelling+ MOUTH: Moist mucosal membrane. Severely swollen lips and protrusion of swollen tongue NECK: Supple. No thyromegaly. No nodules. No JVD.  PULMONARY: mildly rhonchorous breath sounds with MV in background CARDIOVASCULAR: S1 and S2. Regular rate and rhythm. No murmurs, rubs, or gallops.  GASTROINTESTINAL: Soft, nontender, non-distended. No masses. Positive bowel sounds. No hepatosplenomegaly.  MUSCULOSKELETAL: No swelling, clubbing, or edema.  NEUROLOGIC: GCS4T SKIN:intact,warm,dry   PERTINENT DATA     Infusions: . sodium chloride 5 mL/hr at 08/05/19 0900  . sodium chloride    . ampicillin-sulbactam (UNASYN) IV Stopped (08/05/19 9563)  . famotidine (PEPCID) IV Stopped (08/04/19 1254)  . fentaNYL infusion INTRAVENOUS 200 mcg/hr (08/05/19 0900)  . furosemide (LASIX) infusion    . magnesium sulfate bolus IVPB    . potassium chloride    . propofol (DIPRIVAN) infusion 40 mcg/kg/min (08/05/19 0900)   Scheduled Medications: . sodium chloride   Intravenous Once  . sodium chloride   Intravenous Once  . C1 esterase inhibitor (Human)  20 Units/kg Intravenous Once  . chlorhexidine gluconate (MEDLINE KIT)  15 mL Mouth Rinse BID  . Chlorhexidine Gluconate Cloth  6 each Topical Daily  . dexamethasone (DECADRON) injection  4 mg Intravenous Q6H  . diphenhydrAMINE  50 mg Intravenous Q8H  . docusate  100 mg Oral BID  . enoxaparin (LOVENOX) injection  40 mg Subcutaneous Q12H  . feeding supplement (PRO-STAT 64)  60 mL Per Tube BID  . feeding supplement (VITAL HIGH PROTEIN)  1,000 mL Per Tube Q24H  . fentaNYL (SUBLIMAZE) injection  25 mcg Intravenous Once  . fentaNYL (SUBLIMAZE) injection  50 mcg Intravenous Once  . fentaNYL (SUBLIMAZE) injection  75 mcg Intravenous Once  . hydrALAZINE   25 mg Oral Q8H  . hydrochlorothiazide  100 mg Per Tube Daily  .  HYDROmorphone (DILAUDID) injection  1 mg Intravenous Once  . mouth rinse  15 mL Mouth Rinse 10 times per day  . methimazole  5 mg Per Tube Daily  . multivitamin with minerals  1 tablet Per Tube Daily  . polyethylene glycol  17 g Oral Daily  . sodium chloride flush  3 mL Intravenous Q12H   PRN Medications: sodium chloride, acetaminophen, docusate sodium, fentaNYL, fentaNYL, fentaNYL (  SUBLIMAZE) injection, fentaNYL (SUBLIMAZE) injection, hydrALAZINE, LORazepam, ondansetron (ZOFRAN) IV, polyethylene glycol, sodium chloride flush Hemodynamic parameters:   Intake/Output: 07/23 0701 - 07/24 0700 In: 1932.9 [I.V.:1122.8; Blood:240; NG/GT:20; IV Piggyback:550.1] Out: 2100 [Urine:2100]  Ventilator  Settings: Vent Mode: PRVC FiO2 (%):  [21 %] 21 % Set Rate:  [15 bmp] 15 bmp Vt Set:  [500 mL] 500 mL PEEP:  [5 cmH20] 5 cmH20 Plateau Pressure:  [17 cmH20] 17 cmH20    LAB RESULTS:  Basic Metabolic Panel: Recent Labs  Lab 08/01/19 0411 08/01/19 0411 08/02/19 0422 08/02/19 0422 08/03/19 0417 08/03/19 0417 08/04/19 0532 08/05/19 0548  NA 141  --  141  --  144  --  143 142  K 3.5   < > 3.7   < > 3.8   < > 4.1 4.4  CL 111  --  110  --  114*  --  111 109  CO2 24  --  23  --  23  --  23 25  GLUCOSE 140*  --  146*  --  146*  --  147* 156*  BUN 38*  --  43*  --  50*  --  56* 56*  CREATININE 1.15*  --  1.05*  --  1.05*  --  1.03*  1.09* 0.91  CALCIUM 8.3*  --  8.4*  --  8.2*  --  8.4* 8.7*  MG 2.4  --  2.7*  --  3.0*  --  3.1* 3.1*  PHOS 3.4  --  3.1  --  2.8  --  3.3 3.5   < > = values in this interval not displayed.   Liver Function Tests: Recent Labs  Lab 08/01/19 0411 08/02/19 0422 08/03/19 0417 08/04/19 0532 08/05/19 0548  ALBUMIN 2.7* 2.7* 2.3* 2.3* 2.5*   No results for input(s): LIPASE, AMYLASE in the last 168 hours. No results for input(s): AMMONIA in the last 168 hours. CBC: Recent Labs  Lab  08/01/19 0411 08/02/19 0422 08/03/19 0417 08/04/19 0532 08/05/19 0548  WBC 10.6* 12.4* 13.6* 15.2* 14.5*  NEUTROABS 8.9* 10.3* 10.8* 11.3* 10.0*  HGB 13.0 13.2 13.0 12.4 13.5  HCT 37.1 39.1 38.6 36.6 40.2  MCV 84.9 85.7 86.2 87.1 86.3  PLT 118* 116* 120* 130* 142*   Cardiac Enzymes: No results for input(s): CKTOTAL, CKMB, CKMBINDEX, TROPONINI in the last 168 hours. BNP: Invalid input(s): POCBNP CBG: No results for input(s): GLUCAP in the last 168 hours.     IMAGING RESULTS:  Imaging: No results found. @PROBHOSP @ No results found.   ASSESSMENT AND PLAN    -Multidisciplinary rounds held today  Acute angioedema  - presumably due to ACE inhibitor however has not responded to generic therapy and has elevated C1 esterase inhibitor levels with likely acquired component.  She has received FFP 2 units, steroids, diphenhydramine, famotidine, still with significant residual angioedema and there is plan for Berinert today.  -Have increased Lovenox subcu to twice daily due to increased VTE risk while on this therapy -We will initiate Lasix GTT low-dose due to progressive edema peripherally x4 - reduce sedation as able to RASS- 0 -continue Full MV support -Wean Fio2 and PEEP as tolerated -will perform SAT/SBT when respiratory parameters are met -We will DC Unasyn today as there is little evidence for active infection.  Bradyarrythmia-improved   - possible due to clonidine although patient takes this on outpatient   - will d/c for now as patient is also on Coreg now -oxygen as needed ICU telemetry monitoring  Accelerated hypertension    -HCTZ 50 mg with mild improvement advanced to 100 mg p.o. daily still with accelerated hypertension further improved, adding Norvasc 5 mg p.o. daily to her regimen. -d/c clonidine due to recurrent rebound episodes of hypertensive urgency  GI/Nutrition GI PROPHYLAXIS as indicated-Orogastric tube feeding intiated - discussed with Jacklynn Barnacle  RD DIET-->TF's as tolerated Constipation protocol as indicated  ENDO - ICU hypoglycemic\Hyperglycemia protocol -check FSBS per protocol   ELECTROLYTES -follow labs as needed -replace as needed -pharmacy consultation   DVT/GI PRX ordered -SCDs  TRANSFUSIONS AS NEEDED MONITOR FSBS ASSESS the need for LABS as needed   Critical care provider statement:    Critical care time (minutes):  33   Critical care time was exclusive of:  Separately billable procedures and treating other patients   Critical care was necessary to treat or prevent imminent or life-threatening deterioration of the following conditions:  angioedema, accelerated HTN,    Critical care was time spent personally by me on the following activities:  Development of treatment plan with patient or surrogate, discussions with consultants, evaluation of patient's response to treatment, examination of patient, obtaining history from patient or surrogate, ordering and performing treatments and interventions, ordering and review of laboratory studies and re-evaluation of patient's condition.  I assumed direction of critical care for this patient from another provider in my specialty: no    This document was prepared using Dragon voice recognition software and may include unintentional dictation errors.    Ottie Glazier, M.D.  Division of [facility]

## 2019-08-05 NOTE — Plan of Care (Signed)

## 2019-08-05 NOTE — Progress Notes (Signed)
MD increased Lovenox subcu to twice daily due to increased VTE risk while on Berinert.

## 2019-08-06 LAB — RENAL FUNCTION PANEL
Albumin: 2.3 g/dL — ABNORMAL LOW (ref 3.5–5.0)
Anion gap: 11 (ref 5–15)
BUN: 79 mg/dL — ABNORMAL HIGH (ref 8–23)
CO2: 22 mmol/L (ref 22–32)
Calcium: 8.6 mg/dL — ABNORMAL LOW (ref 8.9–10.3)
Chloride: 108 mmol/L (ref 98–111)
Creatinine, Ser: 1.17 mg/dL — ABNORMAL HIGH (ref 0.44–1.00)
GFR calc Af Amer: 50 mL/min — ABNORMAL LOW (ref >60)
GFR calc non Af Amer: 43 mL/min — ABNORMAL LOW (ref >60)
Glucose, Bld: 156 mg/dL — ABNORMAL HIGH (ref 70–99)
Phosphorus: 4.3 mg/dL (ref 2.5–4.6)
Potassium: 4.8 mmol/L (ref 3.5–5.1)
Sodium: 141 mmol/L (ref 135–145)

## 2019-08-06 LAB — TROPONIN I (HIGH SENSITIVITY): Troponin I (High Sensitivity): 18 ng/L — ABNORMAL HIGH (ref <18)

## 2019-08-06 LAB — TRIGLYCERIDES: Triglycerides: 85 mg/dL (ref <150)

## 2019-08-06 LAB — CBC WITH DIFFERENTIAL/PLATELET
Abs Immature Granulocytes: 1.43 10*3/uL — ABNORMAL HIGH (ref 0.00–0.07)
Basophils Absolute: 0.1 10*3/uL (ref 0.0–0.1)
Basophils Relative: 0 %
Eosinophils Absolute: 0 10*3/uL (ref 0.0–0.5)
Eosinophils Relative: 0 %
HCT: 39.6 % (ref 36.0–46.0)
Hemoglobin: 12.8 g/dL (ref 12.0–15.0)
Immature Granulocytes: 9 %
Lymphocytes Relative: 8 %
Lymphs Abs: 1.1 10*3/uL (ref 0.7–4.0)
MCH: 28.6 pg (ref 26.0–34.0)
MCHC: 32.3 g/dL (ref 30.0–36.0)
MCV: 88.6 fL (ref 80.0–100.0)
Monocytes Absolute: 1.7 10*3/uL — ABNORMAL HIGH (ref 0.1–1.0)
Monocytes Relative: 11 %
Neutro Abs: 10.9 10*3/uL — ABNORMAL HIGH (ref 1.7–7.7)
Neutrophils Relative %: 72 %
Platelets: 152 10*3/uL (ref 150–400)
RBC: 4.47 MIL/uL (ref 3.87–5.11)
RDW: 13.5 % (ref 11.5–15.5)
WBC Morphology: INCREASED
WBC: 15.3 10*3/uL — ABNORMAL HIGH (ref 4.0–10.5)
nRBC: 0 % (ref 0.0–0.2)

## 2019-08-06 LAB — MAGNESIUM: Magnesium: 3.1 mg/dL — ABNORMAL HIGH (ref 1.7–2.4)

## 2019-08-06 MED ORDER — METOPROLOL TARTRATE 5 MG/5ML IV SOLN: 5.0000 mg | INTRAVENOUS | Status: AC

## 2019-08-06 MED ORDER — METOPROLOL TARTRATE 5 MG/5ML IV SOLN
2.5000 mg | Freq: Four times a day (QID) | INTRAVENOUS | Status: DC | PRN
  Administered 2019-08-06 – 2019-08-07 (×3): 5 mg via INTRAVENOUS
  Administered 2019-08-09: 2.5 mg via INTRAVENOUS
  Administered 2019-08-10: 5 mg via INTRAVENOUS
  Filled 2019-08-06 (×6): qty 5

## 2019-08-06 MED ORDER — METOPROLOL TARTRATE 5 MG/5ML IV SOLN
INTRAVENOUS | Status: AC
  Administered 2019-08-06: 2.5 mg via INTRAVENOUS
  Filled 2019-08-06: qty 5

## 2019-08-06 NOTE — Progress Notes (Signed)
Shift summary:  - Patient remains intubated and sedated this AM. - Patient alternating between A Fib and NSR. - Sedation minimized. - Furosemide infusion restarted.

## 2019-08-06 NOTE — Plan of Care (Signed)

## 2019-08-06 NOTE — Progress Notes (Signed)
CRITICAL CARE PROGRESS NOTE    Name: Melanie Lewis MRN: [id number] DOB: 07-10-1934     LOS: 9   SUBJECTIVE FINDINGS & SIGNIFICANT EVENTS    Patient description:   85 yo F with PMH as below came in with chief complaint of difficulty articulating and SOB with swelling of face/lips and throat. She was intubated due to angioedema, this was thought to be due to lisinopril.    08/03/19- Daughter [name] is at bedside, we discussed hospital course and care plan together. Questions answered. 08/04/19- patient is s/p FFP with significant improvement of severely swollen lips/tongue. 08/05/19 - patient has small cuff leak this am with overall improvement in a lingual and orofacial swelling however still has significant angioedema. 08/06/19- patient had additional improvement in angioedema.  She had episode of AFrVR while family was present and they did say this happens to her occasionally, this was remedied with 2.5 lopressor x1 and po maintance metop 12.2bid.   Lines/tubes : Airway 7 mm (Active)  Secured at (cm) 24 cm 08/03/19 0804  Measured From Lips 08/03/19 [place] 08/03/19 0804  Secured By [employer] 08/03/19 0804  Tube Holder Repositioned Yes 08/03/19 0743  Cuff Pressure (cm H2O) 28 cm H2O 08/03/19 0743  Site Condition Cool;Dry;Edema 08/03/19 0743     NG/OG Tube Orogastric Right mouth Xray Documented cm marking at nare/ corner of mouth 60 cm (Active)  Cm Marking at Nare/Corner of Mouth (if applicable) 60 cm 47/65/46 0804  Site Assessment Clean;Dry;Intact 08/03/19 0804  Ongoing Placement Verification No change in cm markings or external length of tube from initial placement;No change in respiratory status 08/03/19 0804  Status Infusing tube feed 08/03/19 0804  Drainage  Appearance None 08/02/19 2000  Intake (mL) 30 mL 07/31/19 0658  Output (mL) 0 mL 08/02/19 1557     Urethral Catheter Dee RN  Temperature probe 14 Fr. (Active)  Indication for Insertion or Continuance of Catheter Therapy based on hourly urine output monitoring and documentation for critical condition (NOT STRICT I&O) 08/03/19 0723  Site Assessment Clean;Intact 08/03/19 0723  Catheter Maintenance Bag below level of bladder;Catheter secured;Drainage bag/tubing not touching floor;Insertion date on drainage bag;No dependent loops;Seal intact;Bag emptied prior to transport 08/03/19 0723  Collection Container Standard drainage bag 08/03/19 0723  Securement Method Securing device (Describe) 08/03/19 0723  Urinary Catheter Interventions (if applicable) Unclamped 50/35/46 0723  Output (mL) 175 mL 08/03/19 0436    Microbiology/Sepsis markers: Results for orders placed or performed during the hospital encounter of 07/28/19  SARS Coronavirus 2 by RT PCR (hospital order, performed in [facility] hospital lab) Nasopharyngeal Nasopharyngeal Swab     Status: None   Collection Time: 07/28/19  3:43 PM   Specimen: Nasopharyngeal Swab  Result Value Ref Range Status   SARS Coronavirus 2 NEGATIVE NEGATIVE Final    Comment: (NOTE) SARS-CoV-2 target nucleic acids are NOT DETECTED.  The SARS-CoV-2 RNA is generally detectable in upper and lower respiratory specimens during the acute phase of infection. The lowest concentration of SARS-CoV-2 viral copies this assay can detect is 250 copies / mL. A negative result does not preclude SARS-CoV-2 infection and should not be used as the sole basis for treatment or other patient management decisions.  A negative result may occur with improper specimen collection / handling, submission of specimen other than nasopharyngeal swab, presence of viral mutation(s) within the areas targeted by this assay, and inadequate number of viral copies (<250 copies / mL). A negative  result must be combined with clinical observations, patient history, and epidemiological information.  Fact Sheet for Patients:   StrictlyIdeas.no  Fact Sheet for Healthcare Providers: BankingDealers.co.za  This test is not yet approved or  cleared by the [place] FDA and has been authorized for detection and/or diagnosis of SARS-CoV-2 by FDA under an Emergency Use Authorization (EUA).  This EUA will remain in effect (meaning this test can be used) for the duration of the COVID-19 declaration under Section 564(b)(1) of the Act, 21 U.S.C. section 360bbb-3(b)(1), unless the authorization is terminated or revoked sooner.  Performed at [facility], [address] [place], [place] [address]   MRSA PCR Screening     Status: None   Collection Time: 07/28/19  5:46 PM   Specimen: Nasal Mucosa; Nasopharyngeal  Result Value Ref Range Status   MRSA by PCR NEGATIVE NEGATIVE Final    Comment:        The GeneXpert MRSA Assay (FDA approved for NASAL specimens only), is one component of a comprehensive MRSA colonization surveillance program. It is not intended to diagnose MRSA infection nor to guide or monitor treatment for MRSA infections. Performed at [facility], [address], [place], [place] [address]     Anti-infectives:  Anti-infectives (From admission, onward)   Start     Dose/Rate Route Frequency Ordered Stop   08/02/19 1200  Ampicillin-Sulbactam (UNASYN) 3 g in sodium chloride 0.9 % 100 mL IVPB     Discontinue     3 g 200 mL/hr over 30 Minutes Intravenous Every 6 hours 08/02/19 1014          Tests / Events: C1esterase deficiency    PAST MEDICAL HISTORY   Past Medical History:  Diagnosis Date  . CHF (congestive heart failure) ([place])   . Coronary artery disease   . Dementia ([place])   . Heart attack ([place])   . Hypertension   . hyperthyroid   . Hyperthyroidism   . MI (myocardial infarction)  [facility])      SURGICAL HISTORY   Past Surgical History:  Procedure Laterality Date  . CARDIAC CATHETERIZATION N/A 08/14/2014   Procedure: Left Heart Cath and Coronary Angiography;  Surgeon: Isaias Cowman, MD;  Location: [facility];  Service: Cardiovascular;  Laterality: N/A;  . CHOLECYSTECTOMY    . PARTIAL HYSTERECTOMY       FAMILY HISTORY   Family History  Problem Relation Age of Onset  . Heart attack Sister   . Hypertension Sister   . Dementia Sister   . Heart attack Son   . Dementia Sister      SOCIAL HISTORY   Social History   Tobacco Use  . Smoking status: Never Smoker  . Smokeless tobacco: Never Used  Substance Use Topics  . Alcohol use: Yes    Alcohol/week: 2.0 standard drinks    Types: 2 Shots of liquor per week    Comment: occasional  . Drug use: No     MEDICATIONS   Current Medication:  Current Facility-Administered Medications:  .  0.9 %  sodium chloride infusion (Manually program via Guardrails IV Fluids), , Intravenous, Once, Duffy Bruce, MD .  0.9 %  sodium chloride infusion (Manually program via Guardrails IV Fluids), , Intravenous, Once, Kyrin Garn, MD .  0.9 %  sodium chloride infusion, 250 mL, Intravenous, PRN, Flora Lipps, MD, Stopped at 08/06/19 0457 .  0.9 %  sodium chloride infusion, , Intravenous, Continuous, Lanney Gins, Tildon Silveria, MD .  acetaminophen (TYLENOL) tablet 650  mg, 650 mg, Oral, Q4H PRN, Mortimer Fries, Kurian, MD .  amLODipine (NORVASC) tablet 5 mg, 5 mg, Oral, Daily, Dailee Manalang, MD, 5 mg at 08/05/19 1051 .  Ampicillin-Sulbactam (UNASYN) 3 g in sodium chloride 0.9 % 100 mL IVPB, 3 g, Intravenous, Q6H, Flora Lipps, MD, Stopped at 08/06/19 0527 .  chlorhexidine gluconate (MEDLINE KIT) (PERIDEX) 0.12 % solution 15 mL, 15 mL, Mouth Rinse, BID, Kasa, Kurian, MD, 15 mL at 08/06/19 0716 .  Chlorhexidine Gluconate Cloth 2 % PADS 6 each, 6 each, Topical, Daily, Tukov-Yual, Magdalene S, NP, 6 each at 08/06/19 0657 .   dexamethasone (DECADRON) injection 4 mg, 4 mg, Intravenous, Q6H, Kasa, Kurian, MD, 4 mg at 08/06/19 0501 .  diphenhydrAMINE (BENADRYL) injection 50 mg, 50 mg, Intravenous, Q8H, Elisheva Fallas, MD, 50 mg at 08/06/19 0454 .  docusate (COLACE) 50 MG/5ML liquid 100 mg, 100 mg, Oral, BID, Kasa, Kurian, MD, 100 mg at 08/05/19 2213 .  docusate sodium (COLACE) capsule 100 mg, 100 mg, Oral, BID PRN, Mortimer Fries, Kurian, MD .  enoxaparin (LOVENOX) injection 40 mg, 40 mg, Subcutaneous, Q12H, Lanney Gins, Anahlia Iseminger, MD, 40 mg at 08/05/19 2210 .  famotidine (PEPCID) IVPB 20 mg premix, 20 mg, Intravenous, Q24H, Flora Lipps, MD, Stopped at 08/05/19 1253 .  feeding supplement (PRO-STAT 64) liquid 60 mL, 60 mL, Per Tube, BID, Nefi Musich, MD, 60 mL at 08/05/19 2210 .  feeding supplement (VITAL HIGH PROTEIN) liquid 1,000 mL, 1,000 mL, Per Tube, Q24H, Kasa, Kurian, MD, 1,000 mL at 08/05/19 1653 .  fentaNYL (SUBLIMAZE) bolus via infusion 25 mcg, 25 mcg, Intravenous, Q1H PRN, Duffy Bruce, MD, 25 mcg at 07/31/19 0440 .  fentaNYL (SUBLIMAZE) bolus via infusion 25 mcg, 25 mcg, Intravenous, Q15 min PRN, Mortimer Fries, Kurian, MD .  fentaNYL (SUBLIMAZE) injection 25 mcg, 25 mcg, Intravenous, Once, Kasa, Kurian, MD .  fentaNYL (SUBLIMAZE) injection 50 mcg, 50 mcg, Intravenous, Q15 min PRN, Duffy Bruce, MD, 50 mcg at 07/28/19 1521 .  fentaNYL (SUBLIMAZE) injection 50 mcg, 50 mcg, Intravenous, Q2H PRN, Duffy Bruce, MD .  fentaNYL (SUBLIMAZE) injection 50 mcg, 50 mcg, Intravenous, Once, Duffy Bruce, MD .  fentaNYL (SUBLIMAZE) injection 75 mcg, 75 mcg, Intravenous, Once, Duffy Bruce, MD .  fentaNYL 2590mg in NS 2568m(1073mml) infusion-PREMIX, 25-200 mcg/hr, Intravenous, Continuous, Kasa, Kurian, MD, Last Rate: 5 mL/hr at 08/06/19 0800, 50 mcg/hr at 08/06/19 0800 .  furosemide (LASIX) 250 mg in dextrose 5 % 250 mL (1 mg/mL) infusion, 4 mg/hr, Intravenous, Continuous, Asa Fath, MD, Last Rate: 4 mL/hr at 08/06/19 0800, 4  mg/hr at 08/06/19 0800 .  hydrALAZINE (APRESOLINE) injection 10 mg, 10 mg, Intravenous, Q4H PRN, Tukov-Yual, Magdalene S, NP, 10 mg at 08/05/19 2040 .  hydrALAZINE (APRESOLINE) tablet 25 mg, 25 mg, Oral, Q8H, Tukov-Yual, Magdalene S, NP, 25 mg at 08/05/19 1436 .  hydrochlorothiazide (HYDRODIURIL) tablet 100 mg, 100 mg, Per Tube, Daily, AleOttie GlazierD, 100 mg at 08/05/19 0946 .  HYDROmorphone (DILAUDID) injection 1 mg, 1 mg, Intravenous, Once, IsaDuffy BruceD .  LORazepam (ATIVAN) injection 2-4 mg, 2-4 mg, Intravenous, Q1H PRN, KasFlora LippsD, 2 mg at 07/28/19 1809 .  MEDLINE mouth rinse, 15 mL, Mouth Rinse, 10 times per day, KasFlora LippsD, 15 mL at 08/06/19 0657 .  methimazole (TAPAZOLE) tablet 5 mg, 5 mg, Per Tube, Daily, AleOttie GlazierD, 5 mg at 08/05/19 0947 .  metoprolol tartrate (LOPRESSOR) injection 2.5-5 mg, 2.5-5 mg, Intravenous, Q6H PRN, BlaAwilda BillP .  multivitamin with minerals tablet  1 tablet, 1 tablet, Per Tube, Daily, Ottie Glazier, MD, 1 tablet at 08/05/19 0946 .  ondansetron (ZOFRAN) injection 4 mg, 4 mg, Intravenous, Q6H PRN, Mortimer Fries, Kurian, MD .  polyethylene glycol (MIRALAX / GLYCOLAX) packet 17 g, 17 g, Oral, Daily PRN, Mortimer Fries, Kurian, MD .  polyethylene glycol (MIRALAX / GLYCOLAX) packet 17 g, 17 g, Oral, Daily, Kasa, Kurian, MD, 17 g at 08/05/19 0939 .  propofol (DIPRIVAN) 1000 MG/100ML infusion, 0-50 mcg/kg/min, Intravenous, Continuous, Kasa, Kurian, MD, Last Rate: 5.58 mL/hr at 08/06/19 0800, 10 mcg/kg/min at 08/06/19 0800 .  sodium chloride flush (NS) 0.9 % injection 3 mL, 3 mL, Intravenous, Q12H, Kasa, Kurian, MD, 3 mL at 08/05/19 2213 .  sodium chloride flush (NS) 0.9 % injection 3 mL, 3 mL, Intravenous, PRN, Flora Lipps, MD    ALLERGIES   Lisinopril    REVIEW OF SYSTEMS     Unable to obtain due to sedation with MV  PHYSICAL EXAMINATION   Vital Signs: Temp:  [98.2 F (36.8 C)-99.3 F (37.4 C)] 99.3 F (37.4 C) (07/25  0800) Pulse Rate:  [38-126] 126 (07/25 0800) Resp:  [15-22] 22 (07/25 0800) BP: (83-187)/(45-85) 160/75 (07/25 0800) SpO2:  [94 %-99 %] 94 % (07/25 0800) FiO2 (%):  [21 %] 21 % (07/25 0800) Weight:  [90.4 kg] 90.4 kg (07/25 0341)  GENERAL:age appropriate on MV HEAD: Normocephalic, atraumatic.  EYES: Pupils equal, round, reactive to light.  No scleral icterus. Periorbital swelling+ MOUTH: Moist mucosal membrane. Severely swollen lips and protrusion of swollen tongue NECK: Supple. No thyromegaly. No nodules. No JVD.  PULMONARY: mildly rhonchorous breath sounds with MV in background CARDIOVASCULAR: S1 and S2. Regular rate and rhythm. No murmurs, rubs, or gallops.  GASTROINTESTINAL: Soft, nontender, non-distended. No masses. Positive bowel sounds. No hepatosplenomegaly.  MUSCULOSKELETAL: No swelling, clubbing, or edema.  NEUROLOGIC: GCS4T SKIN:intact,warm,dry   PERTINENT DATA     Infusions: . sodium chloride Stopped (08/06/19 0457)  . sodium chloride    . ampicillin-sulbactam (UNASYN) IV Stopped (08/06/19 0527)  . famotidine (PEPCID) IV Stopped (08/05/19 1253)  . fentaNYL infusion INTRAVENOUS 50 mcg/hr (08/06/19 0800)  . furosemide (LASIX) infusion 4 mg/hr (08/06/19 0800)  . propofol (DIPRIVAN) infusion 10 mcg/kg/min (08/06/19 0800)   Scheduled Medications: . sodium chloride   Intravenous Once  . sodium chloride   Intravenous Once  . amLODipine  5 mg Oral Daily  . chlorhexidine gluconate (MEDLINE KIT)  15 mL Mouth Rinse BID  . Chlorhexidine Gluconate Cloth  6 each Topical Daily  . dexamethasone (DECADRON) injection  4 mg Intravenous Q6H  . diphenhydrAMINE  50 mg Intravenous Q8H  . docusate  100 mg Oral BID  . enoxaparin (LOVENOX) injection  40 mg Subcutaneous Q12H  . feeding supplement (PRO-STAT 64)  60 mL Per Tube BID  . feeding supplement (VITAL HIGH PROTEIN)  1,000 mL Per Tube Q24H  . fentaNYL (SUBLIMAZE) injection  25 mcg Intravenous Once  . fentaNYL (SUBLIMAZE)  injection  50 mcg Intravenous Once  . fentaNYL (SUBLIMAZE) injection  75 mcg Intravenous Once  . hydrALAZINE  25 mg Oral Q8H  . hydrochlorothiazide  100 mg Per Tube Daily  .  HYDROmorphone (DILAUDID) injection  1 mg Intravenous Once  . mouth rinse  15 mL Mouth Rinse 10 times per day  . methimazole  5 mg Per Tube Daily  . multivitamin with minerals  1 tablet Per Tube Daily  . polyethylene glycol  17 g Oral Daily  . sodium chloride flush  3 mL  Intravenous Q12H   PRN Medications: sodium chloride, acetaminophen, docusate sodium, fentaNYL, fentaNYL, fentaNYL (SUBLIMAZE) injection, fentaNYL (SUBLIMAZE) injection, hydrALAZINE, LORazepam, metoprolol tartrate, ondansetron (ZOFRAN) IV, polyethylene glycol, sodium chloride flush Hemodynamic parameters:   Intake/Output: 07/24 0701 - 07/25 0700 In: 2647.2 [I.V.:902.1; NG/GT:645; IV Piggyback:1100.1] Out: 3614 [Urine:3690]  Ventilator  Settings: Vent Mode: PRVC FiO2 (%):  [21 %] 21 % Set Rate:  [15 bmp] 15 bmp Vt Set:  [500 mL] 500 mL PEEP:  [5 cmH20] 5 cmH20 Plateau Pressure:  [18 cmH20-19 cmH20] 18 cmH20    LAB RESULTS:  Basic Metabolic Panel: Recent Labs  Lab 08/02/19 0422 08/02/19 0422 08/03/19 0417 08/03/19 0417 08/04/19 0532 08/04/19 0532 08/05/19 0548 08/06/19 0452  NA 141  --  144  --  143  --  142 141  K 3.7   < > 3.8   < > 4.1   < > 4.4 4.8  CL 110  --  114*  --  111  --  109 108  CO2 23  --  23  --  23  --  25 22  GLUCOSE 146*  --  146*  --  147*  --  156* 156*  BUN 43*  --  50*  --  56*  --  56* 79*  CREATININE 1.05*  --  1.05*  --  1.03*  1.09*  --  0.91 1.17*  CALCIUM 8.4*  --  8.2*  --  8.4*  --  8.7* 8.6*  MG 2.7*  --  3.0*  --  3.1*  --  3.1* 3.1*  PHOS 3.1  --  2.8  --  3.3  --  3.5 4.3   < > = values in this interval not displayed.   Liver Function Tests: Recent Labs  Lab 08/02/19 0422 08/03/19 0417 08/04/19 0532 08/05/19 0548 08/06/19 0452  ALBUMIN 2.7* 2.3* 2.3* 2.5* 2.3*   No results for  input(s): LIPASE, AMYLASE in the last 168 hours. No results for input(s): AMMONIA in the last 168 hours. CBC: Recent Labs  Lab 08/02/19 0422 08/03/19 0417 08/04/19 0532 08/05/19 0548 08/06/19 0452  WBC 12.4* 13.6* 15.2* 14.5* 15.3*  NEUTROABS 10.3* 10.8* 11.3* 10.0* 10.9*  HGB 13.2 13.0 12.4 13.5 12.8  HCT 39.1 38.6 36.6 40.2 39.6  MCV 85.7 86.2 87.1 86.3 88.6  PLT 116* 120* 130* 142* 152   Cardiac Enzymes: No results for input(s): CKTOTAL, CKMB, CKMBINDEX, TROPONINI in the last 168 hours. BNP: Invalid input(s): POCBNP CBG: No results for input(s): GLUCAP in the last 168 hours.     IMAGING RESULTS:  Imaging: DG Chest Port 1 View  Result Date: 08/05/2019 CLINICAL DATA:  Intubated. EXAM: PORTABLE CHEST 1 VIEW COMPARISON:  07/30/2019 FINDINGS: Patient slightly rotated to the right. Endotracheal tube has tip 3.2 cm above the carina. Enteric tube courses into the stomach and off the film as tip is not visualized. Mild hazy prominence of the right perihilar/infrahilar vessels which may be due to mild vascular congestion. Minimal stable left base opacification which may be due to small effusion with atelectasis. Stable borderline cardiomegaly. Remainder of the exam is unchanged. IMPRESSION: 1. Stable borderline cardiomegaly with suggestion of minimal vascular congestion. Mild stable left base opacification which may be due to small amount of pleural fluid/atelectasis. 2.  Tubes and lines as described. Electronically Signed   By: Marin Olp M.D.   On: 08/05/2019 10:45   @[email or web address] @ DG Chest Port 1 View  Result Date: 08/05/2019 CLINICAL DATA:  Intubated. EXAM: PORTABLE  CHEST 1 VIEW COMPARISON:  07/30/2019 FINDINGS: Patient slightly rotated to the right. Endotracheal tube has tip 3.2 cm above the carina. Enteric tube courses into the stomach and off the film as tip is not visualized. Mild hazy prominence of the right perihilar/infrahilar vessels which may be due to mild vascular  congestion. Minimal stable left base opacification which may be due to small effusion with atelectasis. Stable borderline cardiomegaly. Remainder of the exam is unchanged. IMPRESSION: 1. Stable borderline cardiomegaly with suggestion of minimal vascular congestion. Mild stable left base opacification which may be due to small amount of pleural fluid/atelectasis. 2.  Tubes and lines as described. Electronically Signed   By: Marin Olp M.D.   On: 08/05/2019 10:45     ASSESSMENT AND PLAN    -Multidisciplinary rounds held today  Acute angioedema  - presumably due to ACE inhibitor however has not responded to generic therapy and has elevated C1 esterase inhibitor levels with likely acquired component.  She has received FFP 2 units, steroids, diphenhydramine, famotidine, still with significant residual angioedema and there is plan for [facility] today.  -Have increased Lovenox subcu to twice daily due to increased VTE risk while on this therapy -We will initiate Lasix GTT low-dose due to progressive edema peripherally x4 - reduce sedation as able to RASS- 0 -continue Full MV support -Wean Fio2 and PEEP as tolerated -will perform SAT/SBT when respiratory parameters are met -We will DC Unasyn today as there is little evidence for active infection.  Atrial fibrillation with rapid ventricular response   - metoprolol 12.5   - prn metoprolol IV q6h -oxygen as needed ICU telemetry monitoring  Accelerated hypertension    -HCTZ 50 mg with mild improvement advanced to 100 mg p.o. daily still with accelerated hypertension further improved, adding Norvasc 5 mg p.o. daily to her regimen. -d/c clonidine due to recurrent rebound episodes of hypertensive urgency  GI/Nutrition GI PROPHYLAXIS as indicated-Orogastric tube feeding intiated - discussed with Jacklynn Barnacle RD DIET-->TF's as tolerated Constipation protocol as indicated  ENDO - ICU hypoglycemic\Hyperglycemia protocol -check FSBS per  protocol   ELECTROLYTES -follow labs as needed -replace as needed -pharmacy consultation   DVT/GI PRX ordered -SCDs  TRANSFUSIONS AS NEEDED MONITOR FSBS ASSESS the need for LABS as needed   Critical care provider statement:    Critical care time (minutes):  33   Critical care time was exclusive of:  Separately billable procedures and treating other patients   Critical care was necessary to treat or prevent imminent or life-threatening deterioration of the following conditions:  angioedema, accelerated HTN,    Critical care was time spent personally by me on the following activities:  Development of treatment plan with patient or surrogate, discussions with consultants, evaluation of patient's response to treatment, examination of patient, obtaining history from patient or surrogate, ordering and performing treatments and interventions, ordering and review of laboratory studies and re-evaluation of patient's condition.  I assumed direction of critical care for this patient from another provider in my specialty: no    This document was prepared using Dragon voice recognition software and may include unintentional dictation errors.    Ottie Glazier, M.D.  Division of [place]

## 2019-08-07 LAB — RENAL FUNCTION PANEL
Albumin: 2.4 g/dL — ABNORMAL LOW (ref 3.5–5.0)
Anion gap: 9 (ref 5–15)
BUN: 102 mg/dL — ABNORMAL HIGH (ref 8–23)
CO2: 27 mmol/L (ref 22–32)
Calcium: 8.4 mg/dL — ABNORMAL LOW (ref 8.9–10.3)
Chloride: 109 mmol/L (ref 98–111)
Creatinine, Ser: 1.48 mg/dL — ABNORMAL HIGH (ref 0.44–1.00)
GFR calc Af Amer: 38 mL/min — ABNORMAL LOW (ref >60)
GFR calc non Af Amer: 33 mL/min — ABNORMAL LOW (ref >60)
Glucose, Bld: 185 mg/dL — ABNORMAL HIGH (ref 70–99)
Phosphorus: 4.9 mg/dL — ABNORMAL HIGH (ref 2.5–4.6)
Potassium: 4.8 mmol/L (ref 3.5–5.1)
Sodium: 145 mmol/L (ref 135–145)

## 2019-08-07 LAB — CBC WITH DIFFERENTIAL/PLATELET
Abs Immature Granulocytes: 1.49 10*3/uL — ABNORMAL HIGH (ref 0.00–0.07)
Basophils Absolute: 0.1 10*3/uL (ref 0.0–0.1)
Basophils Relative: 0 %
Eosinophils Absolute: 0 10*3/uL (ref 0.0–0.5)
Eosinophils Relative: 0 %
HCT: 39.5 % (ref 36.0–46.0)
Hemoglobin: 13.4 g/dL (ref 12.0–15.0)
Immature Granulocytes: 8 %
Lymphocytes Relative: 9 %
Lymphs Abs: 1.5 10*3/uL (ref 0.7–4.0)
MCH: 29.2 pg (ref 26.0–34.0)
MCHC: 33.9 g/dL (ref 30.0–36.0)
MCV: 86.1 fL (ref 80.0–100.0)
Monocytes Absolute: 1.9 10*3/uL — ABNORMAL HIGH (ref 0.1–1.0)
Monocytes Relative: 11 %
Neutro Abs: 12.7 10*3/uL — ABNORMAL HIGH (ref 1.7–7.7)
Neutrophils Relative %: 72 %
Platelets: 168 10*3/uL (ref 150–400)
RBC: 4.59 MIL/uL (ref 3.87–5.11)
RDW: 13.5 % (ref 11.5–15.5)
WBC: 17.7 10*3/uL — ABNORMAL HIGH (ref 4.0–10.5)
nRBC: 0 % (ref 0.0–0.2)

## 2019-08-07 LAB — TRIGLYCERIDES: Triglycerides: 99 mg/dL (ref <150)

## 2019-08-07 LAB — MAGNESIUM: Magnesium: 3.2 mg/dL — ABNORMAL HIGH (ref 1.7–2.4)

## 2019-08-07 MED ORDER — HYDRALAZINE HCL 50 MG PO TABS
25.0000 mg | ORAL_TABLET | Freq: Three times a day (TID) | ORAL | Status: DC
  Administered 2019-08-08 – 2019-08-18 (×32): 25 mg
  Filled 2019-08-07 (×31): qty 1

## 2019-08-07 MED ORDER — AMIODARONE HCL IN DEXTROSE 360-4.14 MG/200ML-% IV SOLN: 30.0000 mg/h | INTRAVENOUS | Status: DC

## 2019-08-07 MED ORDER — DEXAMETHASONE SODIUM PHOSPHATE 4 MG/ML IJ SOLN
4.0000 mg | Freq: Two times a day (BID) | INTRAMUSCULAR | Status: DC
  Administered 2019-08-07 – 2019-08-09 (×5): 4 mg via INTRAVENOUS
  Filled 2019-08-07 (×6): qty 1

## 2019-08-07 MED ORDER — DILTIAZEM LOAD VIA INFUSION
10.0000 mg | Freq: Once | INTRAVENOUS | Status: AC
  Administered 2019-08-07: 10 mg via INTRAVENOUS
  Filled 2019-08-07: qty 10

## 2019-08-07 MED ORDER — METOPROLOL TARTRATE 5 MG/5ML IV SOLN: 5.0000 mg | Freq: Once | INTRAVENOUS | Status: AC

## 2019-08-07 MED ORDER — DOCUSATE SODIUM 50 MG/5ML PO LIQD
100.0000 mg | Freq: Two times a day (BID) | ORAL | Status: DC
  Administered 2019-08-08 – 2019-08-18 (×12): 100 mg
  Filled 2019-08-07 (×13): qty 10

## 2019-08-07 MED ORDER — DILTIAZEM HCL-DEXTROSE 125-5 MG/125ML-% IV SOLN (PREMIX)
5.0000 mg/h | INTRAVENOUS | Status: DC
  Administered 2019-08-07: 15 mg/h via INTRAVENOUS
  Administered 2019-08-07: 5 mg/h via INTRAVENOUS
  Administered 2019-08-08: 7.5 mg/h via INTRAVENOUS
  Administered 2019-08-08: 12.5 mg/h via INTRAVENOUS
  Administered 2019-08-09 – 2019-08-11 (×5): 15 mg/h via INTRAVENOUS
  Administered 2019-08-11: 13 mg/h via INTRAVENOUS
  Filled 2019-08-07 (×11): qty 125

## 2019-08-07 MED ORDER — AMIODARONE HCL IN DEXTROSE 360-4.14 MG/200ML-% IV SOLN
60.0000 mg/h | INTRAVENOUS | Status: DC
  Administered 2019-08-07: 60 mg/h via INTRAVENOUS
  Filled 2019-08-07: qty 200

## 2019-08-07 MED ORDER — AMIODARONE LOAD VIA INFUSION
150.0000 mg | Freq: Once | INTRAVENOUS | Status: AC
  Administered 2019-08-07: 150 mg via INTRAVENOUS
  Filled 2019-08-07: qty 83.34

## 2019-08-07 NOTE — Progress Notes (Signed)
All sedation turned off for [facility] at 0719. Pt still only responds to pain at this time. Will briefly open eyes with turns. Unable to follow commands at this time. PRN metoprolol for elevated HR. Metoprolol 5mg  IV once ordered for HR 150s and elevated BP. HR and BP continue to be elevated, amiodarone ordered. Will continue to monitor.

## 2019-08-07 NOTE — Consult Note (Signed)
CARDIOLOGY CONSULT NOTE               Patient ID: CASILDA PICKERILL MRN: [id number] DOB/AGE: 1934/10/14 85 y.o.  Admit date: 07/28/2019 Referring Physician [facility] Primary Physician [facility] Primary Cardiologist [facility] Reason for Consultation atrial fibrillation with RVR  HPI: 85 year old female referred for evaluation of atrial fibrillation with RVR.  The patient has a history of paroxysmal atrial fibrillation, not on chronic anticoagulation, on sotalol at home, history of amiodarone induced thyroiditis, dementia, NSTEMI, chronic diastolic CHF, bradycardia, chronic venous insufficiency. The patient was admitted 07/28/2019 for angioedema.  The patient progressively worsened with increased shortness of breath and swelling, and was emergently intubated, and transferred to ICU.  The patient was initially hypertensive and bradycardic with heart rates in the 40-50s. On 07/30/19, the patient's heart rate increased to 110s in reported sinus tachycardia. The patient received FFP with significant improvement of swelling of lips and tongue. Yesterday, the patient had an episode of atrial fibrillation with RVR, for which she received IV lopressor x 1 with improvement of rate. She has not been on beta blocker since admission. Amiodarone drip was ordered today. Currently, the patient is intubated and sedated, and unable to provide history. Telemetry monitor shows atrial fibrillation with rates 110s to 130s with transient rates in the 150s, especially when nursing staff performs oral care.   Of note, patient underwent Lexiscan Myoview 05/2019 which revealed normal left ventricular function without evidence of scar or ischemia. Review of systems unable to be completed as patient is sedated and intubated.    Past Medical History:  Diagnosis Date  . CHF (congestive heart failure) (HCC)   . Coronary artery disease   . Dementia (HCC)   . Heart attack (HCC)   . Hypertension   . hyperthyroid   .  Hyperthyroidism   . MI (myocardial infarction) [facility])     Past Surgical History:  Procedure Laterality Date  . CARDIAC CATHETERIZATION N/A 08/14/2014   Procedure: Left Heart Cath and Coronary Angiography;  Surgeon: Marcina Millard, MD;  Location: [facility] INVASIVE CV LAB;  Service: Cardiovascular;  Laterality: N/A;  . CHOLECYSTECTOMY    . PARTIAL HYSTERECTOMY      Medications Prior to Admission  Medication Sig Dispense Refill Last Dose  . amiodarone (PACERONE) 200 MG tablet Take 100 mg by mouth daily.   07/28/2019 at 0930  . aspirin EC 81 MG tablet Take 81 mg by mouth daily.   07/28/2019 at 0930  . atorvastatin (LIPITOR) 10 MG tablet Take 1 tablet (10 mg total) by mouth daily. 90 tablet 1 07/28/2019 at 0930  . Biotin 1 MG CAPS Take by mouth.   07/28/2019 at 0930  . Calcium Carb-Cholecalciferol (CALCIUM 1000 + D PO) Calcium 600 mg + vit d 800 iu- 1 tab daily   07/28/2019 at 0930  . carvedilol (COREG) 12.5 MG tablet Take 12.5 mg by mouth 2 (two) times daily with a meal.   07/28/2019 at 0930  . Cholecalciferol (VITAMIN D3) 1000 units CAPS Take by mouth.   07/28/2019 at 0930  . cloNIDine (CATAPRES) 0.1 MG tablet Take by mouth 2 (two) times daily.    07/28/2019 at 0930  . donepezil (ARICEPT) 10 MG tablet Take 1 tablet (10 mg total) by mouth at bedtime. 90 tablet 1 07/27/2019 at 2130  . hydrALAZINE (APRESOLINE) 25 MG tablet Take 25 mg by mouth 2 (two) times daily.   07/28/2019 at 0930  . lisinopril (ZESTRIL) 20 MG tablet TAKE 1 TABLET BY MOUTH EVERY  DAY 90 tablet 0 07/28/2019 at 0930  . memantine (NAMENDA) 5 MG tablet Take 5 mg by mouth 2 (two) times daily.   07/28/2019 at 0930  . methimazole (TAPAZOLE) 5 MG tablet Take 0.5 tablets by mouth daily.    07/28/2019 at 0930  . potassium chloride SA (K-DUR) 20 MEQ tablet Take 1 tablet (20 mg total) by mouth every day (Patient taking differently: Take 20 mEq by mouth 2 (two) times daily. Take 1 tablet (20 mg total) by mouth every day) 30 tablet 0 07/28/2019 at 0930  .  sertraline (ZOLOFT) 25 MG tablet Take 1 tablet (25 mg total) by mouth daily. 90 tablet 1 07/28/2019 at 0930  . sotalol (BETAPACE) 80 MG tablet Take 40 mg by mouth 2 (two) times daily.    07/28/2019 at 0930  . torsemide (DEMADEX) 20 MG tablet Take 20 mg by mouth 2 (two) times daily.   07/28/2019 at 0930  . Vitamin D, Ergocalciferol, (DRISDOL) 1.25 MG (50000 UNIT) CAPS capsule Take 50,000 Units by mouth once a week.   Past Week at Unknown time   Social History   Socioeconomic History  . Marital status: Widowed    Spouse name: Not on file  . Number of children: Not on file  . Years of education: Not on file  . Highest education level: Not on file  Occupational History  . Not on file  Tobacco Use  . Smoking status: Never Smoker  . Smokeless tobacco: Never Used  Substance and Sexual Activity  . Alcohol use: Yes    Alcohol/week: 2.0 standard drinks    Types: 2 Shots of liquor per week    Comment: occasional  . Drug use: No  . Sexual activity: Not Currently  Other Topics Concern  . Not on file  Social History Narrative  . Not on file   Social Determinants of Health   Financial Resource Strain:   . Difficulty of Paying Living Expenses:   Food Insecurity:   . Worried About Programme researcher, broadcasting/film/videounning Out of Food in the Last Year:   . Baristaan Out of Food in the Last Year:   Transportation Needs:   . Freight forwarderLack of Transportation (Medical):   Marland Kitchen. Lack of Transportation (Non-Medical):   Physical Activity:   . Days of Exercise per Week:   . Minutes of Exercise per Session:   Stress:   . Feeling of Stress :   Social Connections:   . Frequency of Communication with Friends and Family:   . Frequency of Social Gatherings with Friends and Family:   . Attends Religious Services:   . Active Member of Clubs or Organizations:   . Attends BankerClub or Organization Meetings:   Marland Kitchen. Marital Status:   Intimate Partner Violence:   . Fear of Current or Ex-Partner:   . Emotionally Abused:   Marland Kitchen. Physically Abused:   . Sexually Abused:      Family History  Problem Relation Age of Onset  . Heart attack Sister   . Hypertension Sister   . Dementia Sister   . Heart attack Son   . Dementia Sister       Review of systems complete and found to be negative unless listed above      PHYSICAL EXAM  General: Critically ill appearing, intubated in no acute distress HEENT:  Normocephalic and atramatic Neck:  No JVD.  Lungs: ventilator support Heart: irregularly irregular, no murmurs, rubs, or gallops Abdomen: no obvious distention Extremities: bilateral pedal edema.   Neuro: sedated  Labs:   Lab Results  Component Value Date   WBC 17.7 (H) 08/07/2019   HGB 13.4 08/07/2019   HCT 39.5 08/07/2019   MCV 86.1 08/07/2019   PLT 168 08/07/2019    Recent Labs  Lab 08/07/19 0407  NA 145  K 4.8  CL 109  CO2 27  BUN 102*  CREATININE 1.48*  CALCIUM 8.4*  GLUCOSE 185*   Lab Results  Component Value Date   CKTOTAL 59 02/06/2014   CKMB 4.0 (H) 02/06/2014   TROPONINI 5.32 (H) 08/14/2014    Lab Results  Component Value Date   CHOL 277 (H) 04/16/2017   CHOL 155 02/08/2014   Lab Results  Component Value Date   HDL 83 04/16/2017   HDL 52 02/08/2014   Lab Results  Component Value Date   LDLCALC 174 (H) 04/16/2017   LDLCALC 89 02/08/2014   Lab Results  Component Value Date   TRIG 99 08/07/2019   TRIG 85 08/06/2019   TRIG 86 08/05/2019   Lab Results  Component Value Date   CHOLHDL 3.3 04/16/2017   No results found for: LDLDIRECT    Radiology: [facility] Chest Port 1 View  Result Date: 08/05/2019 CLINICAL DATA:  Intubated. EXAM: PORTABLE CHEST 1 VIEW COMPARISON:  07/30/2019 FINDINGS: Patient slightly rotated to the right. Endotracheal tube has tip 3.2 cm above the carina. Enteric tube courses into the stomach and off the film as tip is not visualized. Mild hazy prominence of the right perihilar/infrahilar vessels which may be due to mild vascular congestion. Minimal stable left base opacification which may be  due to small effusion with atelectasis. Stable borderline cardiomegaly. Remainder of the exam is unchanged. IMPRESSION: 1. Stable borderline cardiomegaly with suggestion of minimal vascular congestion. Mild stable left base opacification which may be due to small amount of pleural fluid/atelectasis. 2.  Tubes and lines as described. Electronically Signed   By: Elberta Fortis M.D.   On: 08/05/2019 10:45   DG Chest Port 1 View  Result Date: 07/30/2019 CLINICAL DATA:  Acute respiratory failure EXAM: PORTABLE CHEST 1 VIEW COMPARISON:  Radiograph 07/29/2019 FINDINGS: Endotracheal tube tip terminates 3.1 cm from the carina. Transesophageal tube tip terminates below the level of imaging, beyond the GE junction. Telemetry leads overlie the chest. Some mildly coarsened interstitial changes are quite similar to prior accounting for some increasing atelectatic change. No new focal consolidation, pneumothorax or effusion. The aorta is calcified. Borderline cardiomegaly is similar to prior. The remaining cardiomediastinal contours are unremarkable. No acute osseous or soft tissue abnormality. IMPRESSION: 1. Lines and tubes as above. 2. Stable mildly coarsened interstitial changes. 3. Some increasing atelectatic change in the bases. Electronically Signed   By: Kreg Shropshire M.D.   On: 07/30/2019 15:09   DG Chest Port 1 View  Result Date: 07/29/2019 CLINICAL DATA:  85 year old female with respiratory failure EXAM: PORTABLE CHEST 1 VIEW COMPARISON:  07/28/2019 FINDINGS: Cardiomediastinal silhouette unchanged in size and contour. Endotracheal tube terminates 3.8 cm above the carina. Gastric tube terminates in the upper abdomen with the side port persisting at the GE junction. No pneumothorax. Coarsened interstitial markings bilaterally, unchanged. No new confluent airspace disease. IMPRESSION: Similar appearance of the chest x-rays with bilateral mild interstitial opacities. Unchanged endotracheal tube. Unchanged gastric tube  with the side port terminating near the GE junction. Electronically Signed   By: Gilmer Mor D.O.   On: 07/29/2019 09:13   DG Chest Portable 1 View  Result Date: 07/28/2019 CLINICAL DATA:  Cyst post  intubation.  Respiratory failure. EXAM: PORTABLE CHEST 1 VIEW COMPARISON:  08/13/2014 FINDINGS: Endotracheal tube is now present, 5.3 cm above the carina. Nasogastric tube extends into the upper abdomen just beyond the gastroesophageal junction with its proximal side hole positioned at the gastroesophageal junction. The lungs are symmetrically expanded. No pneumothorax or pleural effusion. Trace interstitial pulmonary edema has developed, likely cardiogenic in nature. Mild cardiomegaly is present new since prior examination. IMPRESSION: Endotracheal tube in appropriate position. Nasogastric tube chest within the gastric lumen. Advancement of 5-10 cm may more optimally position the catheter. Mild cardiogenic failure.  New mild cardiomegaly. Electronically Signed   By: Helyn Numbers MD   On: 07/28/2019 15:45   DG Abd Portable 1 View  Result Date: 07/28/2019 CLINICAL DATA:  Post intubation EXAM: PORTABLE ABDOMEN - 1 VIEW COMPARISON:  None. FINDINGS: The bowel gas pattern is normal. Tip the NG tube is seen within the proximal stomach. No acute osseous. IMPRESSION: Tip the NG tube in the proximal stomach. Electronically Signed   By: Jonna Clark M.D.   On: 07/28/2019 15:57    EKG: atrial fibrillation with RVR  ASSESSMENT AND PLAN:  1. Atrial fibrillation with RVR, likely due to reflex tachycardia since holding home sotalol and carvedilol. Patient is not on chronic anticoagulation per outpatient cardiologist.  2. Angioedema with respiratory failure, currently improving after FFP, intubated and sedated 3. History of coronary artery disease, with history of NSTEMI 4. Chronic diastolic CHF  Recommendations: 1. Discontinue amiodarone drip with history of amiodarone-induced thyroiditis 2. Give Cardizem bolus  10 mg, followed by infusion  3. When heart rate is better controlled, will plan to resume home sotalol dose. 4. Defer chronic anticoagulation decision to outpatient cardiologist. 5. Lasix as needed; monitor daily weights, I&Os  Discussed with Dr. Darrold Junker who agrees with above plan.    Signed: [name] PA-C 08/07/2019, 1:50 PM

## 2019-08-07 NOTE — Progress Notes (Signed)
CRITICAL CARE NOTE  7/16 admitted to ICU for severe angioedema 7/17 severe angioedema, resp failure 7/18 severe angioedema, resp failure 7/19 severe andioedema, resp failure 7/20 severe angioedema, resp failure 08/03/19- Daughter [name] is at bedside, we discussed hospital course and care plan together. Questions answered. 08/04/19- patient is s/p FFP with significant improvement of severely swollen lips/tongue. 08/05/19 - patient has small cuff leak this am with overall improvement in a lingual and orofacial swelling however still has significant angioedema. 08/06/19- patient had additional improvement in angioedema.  She had episode of AFrVR while family was present and they did say this happens to her occasionally, this was remedied with 2.5 lopressor x1 and po maintance metop 12.2bid.   CC  follow up respiratory failure  SUBJECTIVE Patient remains critically ill Prognosis is guarded   BP (!) 149/88   Pulse (!) 31   Temp 99.9 F (37.7 C)   Resp 16   Ht 5' 2.01" (1.575 m)   Wt 90.4 kg   LMP  (LMP Unknown)   SpO2 95%   BMI 36.44 kg/m    I/O last 3 completed shifts: In: 2673.4 [I.V.:915.6; NG/GT:790; IV Piggyback:967.8] Out: 9509 [Urine:7190] Total I/O In: 126.2 [I.V.:26.2; IV Piggyback:100] Out: -   SpO2: 95 % FiO2 (%): 21 %  Estimated body mass index is 36.44 kg/m as calculated from the following:   Height as of this encounter: 5' 2.01" (1.575 m).   Weight as of this encounter: 90.4 kg.  SIGNIFICANT EVENTS   REVIEW OF SYSTEMS  PATIENT IS UNABLE TO PROVIDE COMPLETE REVIEW OF SYSTEMS DUE TO SEVERE CRITICAL ILLNESS        PHYSICAL EXAMINATION:  GENERAL:critically ill appearing, +resp distress HEAD: Normocephalic, atraumatic.  EYES: Pupils equal, round, reactive to light.  No scleral icterus.  MOUTH: Moist mucosal membrane. NECK: Supple.  PULMONARY: +rhonchi,  CARDIOVASCULAR: S1 and S2. Regular rate and rhythm. No murmurs, rubs, or gallops.   GASTROINTESTINAL: Soft, nontender, -distended.  Positive bowel sounds.   MUSCULOSKELETAL: No swelling, clubbing, or edema.  NEUROLOGIC: obtunded, GCS<8 SKIN:intact,warm,dry  MEDICATIONS: I have reviewed all medications and confirmed regimen as documented   CULTURE RESULTS   Recent Results (from the past 240 hour(s))  SARS Coronavirus 2 by RT PCR (hospital order, performed in [facility] hospital lab) Nasopharyngeal Nasopharyngeal Swab     Status: None   Collection Time: 07/28/19  3:43 PM   Specimen: Nasopharyngeal Swab  Result Value Ref Range Status   SARS Coronavirus 2 NEGATIVE NEGATIVE Final    Comment: (NOTE) SARS-CoV-2 target nucleic acids are NOT DETECTED.  The SARS-CoV-2 RNA is generally detectable in upper and lower respiratory specimens during the acute phase of infection. The lowest concentration of SARS-CoV-2 viral copies this assay can detect is 250 copies / mL. A negative result does not preclude SARS-CoV-2 infection and should not be used as the sole basis for treatment or other patient management decisions.  A negative result may occur with improper specimen collection / handling, submission of specimen other than nasopharyngeal swab, presence of viral mutation(s) within the areas targeted by this assay, and inadequate number of viral copies (<250 copies / mL). A negative result must be combined with clinical observations, patient history, and epidemiological information.  Fact Sheet for Patients:   StrictlyIdeas.no  Fact Sheet for Healthcare Providers: BankingDealers.co.za  This test is not yet approved or  cleared by the [place] FDA and has been authorized for detection and/or diagnosis of SARS-CoV-2 by FDA under an Emergency Use  Authorization (EUA).  This EUA will remain in effect (meaning this test can be used) for the duration of the COVID-19 declaration under Section 564(b)(1) of the Act, 21  U.S.C. section 360bbb-3(b)(1), unless the authorization is terminated or revoked sooner.  Performed at [facility], [address] [place], [place] [address]   MRSA PCR Screening     Status: None   Collection Time: 07/28/19  5:46 PM   Specimen: Nasal Mucosa; Nasopharyngeal  Result Value Ref Range Status   MRSA by PCR NEGATIVE NEGATIVE Final    Comment:        The GeneXpert MRSA Assay (FDA approved for NASAL specimens only), is one component of a comprehensive MRSA colonization surveillance program. It is not intended to diagnose MRSA infection nor to guide or monitor treatment for MRSA infections. Performed at [facility], [address] [place],  [address]           IMAGING    No results found.   Nutrition Status: Nutrition Problem: Inadequate oral intake Etiology: inability to eat (pt sedated and ventilated) Signs/Symptoms: NPO status Interventions: Tube feeding  BMP Latest Ref Rng & Units 08/07/2019 08/06/2019 08/05/2019  Glucose 70 - 99 mg/dL 185(H) 156(H) 156(H)  BUN 8 - 23 mg/dL 102(H) 79(H) 56(H)  Creatinine 0.44 - 1.00 mg/dL 1.48(H) 1.17(H) 0.91  BUN/Creat Ratio 6 - 22 (calc) - - -  Sodium 135 - 145 mmol/L 145 141 142  Potassium 3.5 - 5.1 mmol/L 4.8 4.8 4.4  Chloride 98 - 111 mmol/L 109 108 109  CO2 22 - 32 mmol/L _0 Calcium 8.9 - 10.3 mg/dL 8.4(L) 8.6(L) 8.7(L)      Indwelling Urinary Catheter continued, requirement due to   Reason to continue Indwelling Urinary Catheter strict Intake/Output monitoring for hemodynamic instability         Ventilator continued, requirement due to severe respiratory failure   Ventilator Sedation RASS 0 to -2      ASSESSMENT AND PLAN SYNOPSIS Severe ACUTE Hypoxic and Hypercapnic Respiratory Failurefrom severe angioedemaandsevere airway obstruction  Severe ACUTE Hypoxic and Hypercapnic Respiratory Failure -continue Full MV support -continue Bronchodilator Therapy -Wean  Fio2 and PEEP as tolerated -will perform SAT/SBT when respiratory parameters are met -VAP/VENT bundle implementation   ACUTE KIDNEY INJURY/Renal Failure -continue Foley Catheter-assess need -Avoid nephrotoxic agents -Follow urine output, BMP -Ensure adequate renal perfusion, optimize oxygenation -Renal dose medications     NEUROLOGY - intubated and sedated - minimal sedation to achieve a RASS goal: -1 Wake up assessment pending   CARDIAC afib with RVR intermittent V tach Obtain cardiology conusltation ICU monitoring   GI GI PROPHYLAXIS as indicated   DIET-->TF's as tolerated Constipation protocol as indicated  ENDO - will use ICU hypoglycemic\Hyperglycemia protocol if indicated     ELECTROLYTES -follow labs as needed -replace as needed -pharmacy consultation and following   DVT/GI PRX ordered and assessed TRANSFUSIONS AS NEEDED MONITOR FSBS I Assessed the need for Labs I Assessed the need for Foley I Assessed the need for Central Venous Line Family Discussion when available I Assessed the need for Mobilization I made an Assessment of medications to be adjusted accordingly Safety Risk assessment completed   CASE DISCUSSED IN MULTIDISCIPLINARY ROUNDS WITH ICU TEAM  Critical Care Time devoted to patient care services described in this note is 32 minutes.   Overall, patient is critically ill, prognosis is guarded.    Corrin Parker, M.D.  [facility]  Medical  Director [place] Director [facility] Cardio-Pulmonary Department

## 2019-08-07 NOTE — Progress Notes (Signed)
CH visited pt.'s rm. while rounding on ICU as follow-up from prior visits; pt.'s dtr. Melanie Lewis in chair at bedside.  Dtr. shared team is planning to extubate pt. today and that pt. is 'doing much better'.  Dtr. lives w/pt. and expressed how much she misses her.   CH offered prayer for pt.'s sucessful extubation today and for continued recovery, as well as for strength for pt.'s dtrs. as they help care for her.  CH remains available as needed.

## 2019-08-07 NOTE — Plan of Care (Signed)
Pt continued on full vent support 21/5/15/500, tolerating vent well, tan thick secretions. Pt will withdraw to pain but no opening eyes. Pt is tolerating TF well, zero residual. Swelling to tongue and lips has improved. Pt is afebrile with Tmax of 99.5, pt get's easily agitated with oral care. HR will jump and will come down after few minutes. No family called last night for updates.  Propofol ftt weaned off.

## 2019-08-07 NOTE — Progress Notes (Addendum)
Sedation remains off. Pt is alert to voice at times now. Moves hand when oral care performed and barely opens eyes with loud verbal cues. HR has improved with cardizem gtt. Will continue to monitor.

## 2019-08-08 ENCOUNTER — Inpatient Hospital Stay: Payer: Medicare Other

## 2019-08-08 LAB — RENAL FUNCTION PANEL
Albumin: 2.6 g/dL — ABNORMAL LOW (ref 3.5–5.0)
Anion gap: 11 (ref 5–15)
BUN: 98 mg/dL — ABNORMAL HIGH (ref 8–23)
CO2: 24 mmol/L (ref 22–32)
Calcium: 8.9 mg/dL (ref 8.9–10.3)
Chloride: 108 mmol/L (ref 98–111)
Creatinine, Ser: 1.3 mg/dL — ABNORMAL HIGH (ref 0.44–1.00)
GFR calc Af Amer: 44 mL/min — ABNORMAL LOW (ref >60)
GFR calc non Af Amer: 38 mL/min — ABNORMAL LOW (ref >60)
Glucose, Bld: 233 mg/dL — ABNORMAL HIGH (ref 70–99)
Phosphorus: 3.8 mg/dL (ref 2.5–4.6)
Potassium: 4.6 mmol/L (ref 3.5–5.1)
Sodium: 143 mmol/L (ref 135–145)

## 2019-08-08 LAB — CBC WITH DIFFERENTIAL/PLATELET
Abs Immature Granulocytes: 1.67 10*3/uL — ABNORMAL HIGH (ref 0.00–0.07)
Basophils Absolute: 0.1 10*3/uL (ref 0.0–0.1)
Basophils Relative: 0 %
Eosinophils Absolute: 0 10*3/uL (ref 0.0–0.5)
Eosinophils Relative: 0 %
HCT: 44 % (ref 36.0–46.0)
Hemoglobin: 14.4 g/dL (ref 12.0–15.0)
Immature Granulocytes: 8 %
Lymphocytes Relative: 8 %
Lymphs Abs: 1.8 10*3/uL (ref 0.7–4.0)
MCH: 29 pg (ref 26.0–34.0)
MCHC: 32.7 g/dL (ref 30.0–36.0)
MCV: 88.7 fL (ref 80.0–100.0)
Monocytes Absolute: 2.2 10*3/uL — ABNORMAL HIGH (ref 0.1–1.0)
Monocytes Relative: 10 %
Neutro Abs: 16.4 10*3/uL — ABNORMAL HIGH (ref 1.7–7.7)
Neutrophils Relative %: 74 %
Platelets: 208 10*3/uL (ref 150–400)
RBC: 4.96 MIL/uL (ref 3.87–5.11)
RDW: 13.4 % (ref 11.5–15.5)
WBC: 22.1 10*3/uL — ABNORMAL HIGH (ref 4.0–10.5)
nRBC: 0 % (ref 0.0–0.2)

## 2019-08-08 LAB — TRIGLYCERIDES: Triglycerides: 176 mg/dL — ABNORMAL HIGH (ref <150)

## 2019-08-08 LAB — MAGNESIUM: Magnesium: 3.3 mg/dL — ABNORMAL HIGH (ref 1.7–2.4)

## 2019-08-08 MED ORDER — ENOXAPARIN SODIUM 40 MG/0.4ML ~~LOC~~ SOLN
40.0000 mg | SUBCUTANEOUS | Status: DC
  Administered 2019-08-09 – 2019-08-31 (×23): 40 mg via SUBCUTANEOUS
  Filled 2019-08-08 (×24): qty 0.4

## 2019-08-08 NOTE — Plan of Care (Signed)

## 2019-08-08 NOTE — Progress Notes (Signed)
CRITICAL CARE NOTE 7/16 admitted to ICU for severe angioedema 7/17 severe angioedema, resp failure 7/18 severe angioedema, resp failure 7/19 severe andioedema, resp failure 7/20 severe angioedema, resp failure 08/03/19- Daughter [name] is at bedside, we discussed hospital course and care plan together. Questions answered. 08/04/19- patient is s/p FFP with significant improvement of severely swollen lips/tongue. 08/05/19 - patient has small cuff leak this am with overall improvement in a lingual and orofacial swelling however still has significant angioedema. 08/06/19-patient had additional improvement in angioedema. She had episode of AFrVR while family was present and they did say this happens to her occasionally, this was remedied with 2.5 lopressor x1 and po maintance metop 12.2bid.  7/26 cardiology consulted for afib, remains encephlopathic  CC  follow up respiratory failure  SUBJECTIVE Patient remains critically ill Prognosis is guarded   BP (!) 139/74   Pulse (!) 39   Temp 99.1 F (37.3 C)   Resp (!) 29   Ht 5' 2.01" (1.575 m)   Wt 84.9 kg   LMP  (LMP Unknown)   SpO2 97%   BMI 34.23 kg/m    I/O last 3 completed shifts: In: 1638.6 [I.V.:718.6; NG/GT:570; IV Piggyback:350] Out: 0102 [Urine:7145] No intake/output data recorded.  SpO2: 97 % FiO2 (%): 21 %  Estimated body mass index is 34.23 kg/m as calculated from the following:   Height as of this encounter: 5' 2.01" (1.575 m).   Weight as of this encounter: 84.9 kg.  SIGNIFICANT EVENTS   REVIEW OF SYSTEMS  PATIENT IS UNABLE TO PROVIDE COMPLETE REVIEW OF SYSTEMS DUE TO SEVERE CRITICAL ILLNESS        PHYSICAL EXAMINATION:  GENERAL:critically ill appearing, +resp distress HEAD: Normocephalic, atraumatic.  EYES: Pupils equal, round, reactive to light.  No scleral icterus.  MOUTH: Moist mucosal membrane. NECK: Supple.  PULMONARY: +rhonchi, +wheezing CARDIOVASCULAR: S1 and S2. Regular rate and rhythm. No  murmurs, rubs, or gallops.  GASTROINTESTINAL: Soft, nontender, -distended.  Positive bowel sounds.   MUSCULOSKELETAL: No swelling, clubbing, or edema.  NEUROLOGIC: obtunded, GCS<8 SKIN:intact,warm,dry  MEDICATIONS: I have reviewed all medications and confirmed regimen as documented   CULTURE RESULTS   No results found for this or any previous visit (from the past 240 hour(s)).        IMAGING    No results found.   Nutrition Status: Nutrition Problem: Inadequate oral intake Etiology: inability to eat (pt sedated and ventilated) Signs/Symptoms: NPO status Interventions: Tube feeding     Indwelling Urinary Catheter continued, requirement due to   Reason to continue Indwelling Urinary Catheter strict Intake/Output monitoring for hemodynamic instability         Ventilator continued, requirement due to severe respiratory failure   Ventilator Sedation RASS 0 to -2      ASSESSMENT AND PLAN SYNOPSIS  Severe ACUTE Hypoxic and Hypercapnic Respiratory Failurefrom severe angioedemaandsevere airway obstruction  Severe ACUTE Hypoxic and Hypercapnic Respiratory Failure -continue Full MV support -continue Bronchodilator Therapy -Wean Fio2 and PEEP as tolerated -will perform SAT/SBT when respiratory parameters are met -VAP/VENT bundle implementation   ACUTE KIDNEY INJURY/Renal Failure -continue Foley Catheter-assess need -Avoid nephrotoxic agents -Follow urine output, BMP -Ensure adequate renal perfusion, optimize oxygenation -Renal dose medications  ANGIOEDEMA IMPROVING WITH TIME AND STEROIDS  check CUFF LEAK  NEUROLOGY - intubated and sedated - minimal sedation to achieve a RASS goal: -1 Wake up assessment pending    CARDIAC ICU monitoring   GI GI PROPHYLAXIS as indicated   DIET-->TF's as tolerated Constipation protocol as  indicated  ENDO - will use ICU hypoglycemic\Hyperglycemia protocol if indicated     ELECTROLYTES -follow labs as  needed -replace as needed -pharmacy consultation and following   DVT/GI PRX ordered and assessed TRANSFUSIONS AS NEEDED MONITOR FSBS I Assessed the need for Labs I Assessed the need for Foley I Assessed the need for Central Venous Line Family Discussion when available I Assessed the need for Mobilization I made an Assessment of medications to be adjusted accordingly Safety Risk assessment completed   CASE DISCUSSED IN MULTIDISCIPLINARY ROUNDS WITH ICU TEAM  Critical Care Time devoted to patient care services described in this note is 32 minutes.   Overall, patient is critically ill, prognosis is guarded.    Corrin Parker, M.D.  [facility]  Medical Director [place] Director [facility] Cardio-Pulmonary Department

## 2019-08-08 NOTE — Progress Notes (Signed)
[facility] Cardiology    SUBJECTIVE: The patient remains sedated. Unable to complete ROS.   Vitals:   08/08/19 0400 08/08/19 0500 08/08/19 0600 08/08/19 0700  BP: (!) 172/85 114/72 (!) 139/74 (!) 158/85  Pulse: 73 67 (!) 39 77  Resp: 16 15 (!) 29 15  Temp: 99.1 F (37.3 C) 99.1 F (37.3 C) 99.1 F (37.3 C) 99.1 F (37.3 C)  TempSrc:      SpO2: 97% 97% 97% 98%  Weight:  84.9 kg    Height:         Intake/Output Summary (Last 24 hours) at 08/08/2019 0739 Last data filed at 08/08/2019 0700 Gross per 24 hour  Intake 752.19 ml  Output 3045 ml  Net -2292.81 ml      PHYSICAL EXAM   General: Critically ill appearing, intubated in no acute distress HEENT:  Normocephalic and atraumatic. Lips swollen Neck:   No JVD.  Lungs: ventilator support Heart: irregularly irregular, no murmurs, rubs, or gallops Abdomen: no obvious distention MSK: no obvious deformities Extremities: bilateral pedal edema.   Neuro: sedated  LABS: Basic Metabolic Panel: Recent Labs    08/07/19 0407 08/08/19 0415  NA 145 143  K 4.8 4.6  CL 109 108  CO2 27 24  GLUCOSE 185* 233*  BUN 102* 98*  CREATININE 1.48* 1.30*  CALCIUM 8.4* 8.9  MG 3.2* 3.3*  PHOS 4.9* 3.8   Liver Function Tests: Recent Labs    08/07/19 0407 08/08/19 0415  ALBUMIN 2.4* 2.6*   No results for input(s): LIPASE, AMYLASE in the last 72 hours. CBC: Recent Labs    08/07/19 0407 08/08/19 0415  WBC 17.7* 22.1*  NEUTROABS 12.7* 16.4*  HGB 13.4 14.4  HCT 39.5 44.0  MCV 86.1 88.7  PLT 168 208   Cardiac Enzymes: No results for input(s): CKTOTAL, CKMB, CKMBINDEX, TROPONINI in the last 72 hours. BNP: Invalid input(s): POCBNP D-Dimer: No results for input(s): DDIMER in the last 72 hours. Hemoglobin A1C: No results for input(s): HGBA1C in the last 72 hours. Fasting Lipid Panel: Recent Labs    08/08/19 0415  TRIG 176*   Thyroid Function Tests: No results for input(s): TSH, T4TOTAL, T3FREE, THYROIDAB in the last 72  hours.  Invalid input(s): FREET3 Anemia Panel: No results for input(s): VITAMINB12, FOLATE, FERRITIN, TIBC, IRON, RETICCTPCT in the last 72 hours.  DG Chest Port 1 View  Result Date: 08/08/2019 CLINICAL DATA:  Hypoxia EXAM: PORTABLE CHEST 1 VIEW COMPARISON:  August 05, 2019. FINDINGS: Endotracheal tube tip is 3.4 cm above the carina. Nasogastric tube tip and side port are in the stomach. No pneumothorax. Lungs are clear. Heart is mildly enlarged with pulmonary vascularity normal. No adenopathy. There is aortic atherosclerosis. No bone lesions. IMPRESSION: Tube positions as described without pneumothorax. Lungs clear. Stable cardiac prominence. Aortic Atherosclerosis (ICD10-I70.0). Electronically Signed   By: Bretta Bang III M.D.   On: 08/08/2019 07:18    TELEMETRY: atrial fibrillation, rates 70-80s  ASSESSMENT AND PLAN:  Active Problems:   Angioedema    ASSESSMENT AND PLAN:  1. Atrial fibrillation with RVR, likely due to reflex tachycardia since holding home sotalol and carvedilol. Patient is not on chronic anticoagulation per outpatient cardiologist. Patient started on amiodarone drip, which was discontinued due to history of amiodarone-induced thyroiditis, and started on cardizem drip with improvement of rate to 70s-80s 2. Angioedema with respiratory failure, currently improving after FFP, intubated and sedated 3. History of coronary artery disease, with history of NSTEMI 4. Chronic diastolic CHF  Recommendations:  1. Continue Cardizem drip for now 2. Recommend resuming carvedilol 12.5 mg BID and sotalol 40 mg BID when patient is extubated. 3. Defer chronic anticoagulation per outpatient cardiologist 4. IV Lasix PRN    [name], PA-C 08/08/2019 7:39 AM Discussed with Dr. Darrold Junker who agrees with plan.

## 2019-08-09 DIAGNOSIS — J9601 Acute respiratory failure with hypoxia: Secondary | ICD-10-CM | POA: Diagnosis not present

## 2019-08-09 DIAGNOSIS — T783XXA Angioneurotic edema, initial encounter: Secondary | ICD-10-CM | POA: Diagnosis not present

## 2019-08-09 LAB — CBC WITH DIFFERENTIAL/PLATELET
Abs Immature Granulocytes: 1.34 10*3/uL — ABNORMAL HIGH (ref 0.00–0.07)
Basophils Absolute: 0.1 10*3/uL (ref 0.0–0.1)
Basophils Relative: 0 %
Eosinophils Absolute: 0 10*3/uL (ref 0.0–0.5)
Eosinophils Relative: 0 %
HCT: 44.1 % (ref 36.0–46.0)
Hemoglobin: 14.9 g/dL (ref 12.0–15.0)
Immature Granulocytes: 5 %
Lymphocytes Relative: 7 %
Lymphs Abs: 1.8 10*3/uL (ref 0.7–4.0)
MCH: 29.5 pg (ref 26.0–34.0)
MCHC: 33.8 g/dL (ref 30.0–36.0)
MCV: 87.3 fL (ref 80.0–100.0)
Monocytes Absolute: 1.7 10*3/uL — ABNORMAL HIGH (ref 0.1–1.0)
Monocytes Relative: 6 %
Neutro Abs: 21.9 10*3/uL — ABNORMAL HIGH (ref 1.7–7.7)
Neutrophils Relative %: 82 %
Platelets: 211 10*3/uL (ref 150–400)
RBC: 5.05 MIL/uL (ref 3.87–5.11)
RDW: 13.7 % (ref 11.5–15.5)
WBC: 26.9 10*3/uL — ABNORMAL HIGH (ref 4.0–10.5)
nRBC: 0 % (ref 0.0–0.2)

## 2019-08-09 LAB — RENAL FUNCTION PANEL
Albumin: 2.7 g/dL — ABNORMAL LOW (ref 3.5–5.0)
Anion gap: 13 (ref 5–15)
BUN: 88 mg/dL — ABNORMAL HIGH (ref 8–23)
CO2: 22 mmol/L (ref 22–32)
Calcium: 9 mg/dL (ref 8.9–10.3)
Chloride: 110 mmol/L (ref 98–111)
Creatinine, Ser: 1.17 mg/dL — ABNORMAL HIGH (ref 0.44–1.00)
GFR calc Af Amer: 50 mL/min — ABNORMAL LOW (ref >60)
GFR calc non Af Amer: 43 mL/min — ABNORMAL LOW (ref >60)
Glucose, Bld: 257 mg/dL — ABNORMAL HIGH (ref 70–99)
Phosphorus: 3.3 mg/dL (ref 2.5–4.6)
Potassium: 5 mmol/L (ref 3.5–5.1)
Sodium: 145 mmol/L (ref 135–145)

## 2019-08-09 LAB — GLUCOSE, CAPILLARY
Glucose-Capillary: 137 mg/dL — ABNORMAL HIGH (ref 70–99)
Glucose-Capillary: 145 mg/dL — ABNORMAL HIGH (ref 70–99)
Glucose-Capillary: 146 mg/dL — ABNORMAL HIGH (ref 70–99)
Glucose-Capillary: 167 mg/dL — ABNORMAL HIGH (ref 70–99)
Glucose-Capillary: 184 mg/dL — ABNORMAL HIGH (ref 70–99)
Glucose-Capillary: 215 mg/dL — ABNORMAL HIGH (ref 70–99)
Glucose-Capillary: 243 mg/dL — ABNORMAL HIGH (ref 70–99)

## 2019-08-09 LAB — HEMOGLOBIN A1C
Hgb A1c MFr Bld: 5.9 % — ABNORMAL HIGH (ref 4.8–5.6)
Mean Plasma Glucose: 122.63 mg/dL

## 2019-08-09 LAB — MAGNESIUM: Magnesium: 3.5 mg/dL — ABNORMAL HIGH (ref 1.7–2.4)

## 2019-08-09 LAB — TRIGLYCERIDES: Triglycerides: 153 mg/dL — ABNORMAL HIGH (ref <150)

## 2019-08-09 MED ORDER — INSULIN ASPART 100 UNIT/ML ~~LOC~~ SOLN
0.0000 [IU] | SUBCUTANEOUS | Status: DC
  Administered 2019-08-09: 4 [IU] via SUBCUTANEOUS
  Administered 2019-08-09: 7 [IU] via SUBCUTANEOUS
  Administered 2019-08-09 (×2): 3 [IU] via SUBCUTANEOUS
  Administered 2019-08-10 (×2): 4 [IU] via SUBCUTANEOUS
  Administered 2019-08-10: 7 [IU] via SUBCUTANEOUS
  Administered 2019-08-10 (×3): 4 [IU] via SUBCUTANEOUS
  Administered 2019-08-11 – 2019-08-12 (×9): 3 [IU] via SUBCUTANEOUS
  Administered 2019-08-12 (×2): 4 [IU] via SUBCUTANEOUS
  Administered 2019-08-13 (×2): 3 [IU] via SUBCUTANEOUS
  Administered 2019-08-13: 4 [IU] via SUBCUTANEOUS
  Administered 2019-08-13 (×2): 3 [IU] via SUBCUTANEOUS
  Administered 2019-08-14: 7 [IU] via SUBCUTANEOUS
  Administered 2019-08-14 (×3): 3 [IU] via SUBCUTANEOUS
  Administered 2019-08-14: 4 [IU] via SUBCUTANEOUS
  Administered 2019-08-15: 7 [IU] via SUBCUTANEOUS
  Administered 2019-08-15: 3 [IU] via SUBCUTANEOUS
  Administered 2019-08-15: 4 [IU] via SUBCUTANEOUS
  Administered 2019-08-15 (×2): 7 [IU] via SUBCUTANEOUS
  Administered 2019-08-16: 3 [IU] via SUBCUTANEOUS
  Administered 2019-08-16: 4 [IU] via SUBCUTANEOUS
  Administered 2019-08-16 (×2): 3 [IU] via SUBCUTANEOUS
  Administered 2019-08-16 – 2019-08-17 (×3): 4 [IU] via SUBCUTANEOUS
  Administered 2019-08-17: 7 [IU] via SUBCUTANEOUS
  Administered 2019-08-17 – 2019-08-18 (×4): 4 [IU] via SUBCUTANEOUS
  Administered 2019-08-18: 3 [IU] via SUBCUTANEOUS
  Administered 2019-08-18 (×2): 4 [IU] via SUBCUTANEOUS
  Administered 2019-08-19 – 2019-08-21 (×3): 3 [IU] via SUBCUTANEOUS
  Administered 2019-08-22: 7 [IU] via SUBCUTANEOUS
  Administered 2019-08-22: 3 [IU] via SUBCUTANEOUS
  Administered 2019-08-22 (×2): 7 [IU] via SUBCUTANEOUS
  Administered 2019-08-22 – 2019-08-24 (×8): 4 [IU] via SUBCUTANEOUS
  Administered 2019-08-24: 3 [IU] via SUBCUTANEOUS
  Administered 2019-08-24 – 2019-08-25 (×5): 4 [IU] via SUBCUTANEOUS
  Administered 2019-08-25: 3 [IU] via SUBCUTANEOUS
  Administered 2019-08-25 (×2): 7 [IU] via SUBCUTANEOUS
  Administered 2019-08-25 – 2019-08-26 (×3): 4 [IU] via SUBCUTANEOUS
  Administered 2019-08-26: 3 [IU] via SUBCUTANEOUS
  Administered 2019-08-26 (×2): 4 [IU] via SUBCUTANEOUS
  Administered 2019-08-26 – 2019-08-27 (×2): 7 [IU] via SUBCUTANEOUS
  Administered 2019-08-27: 4 [IU] via SUBCUTANEOUS
  Administered 2019-08-27: 7 [IU] via SUBCUTANEOUS
  Administered 2019-08-27 (×2): 4 [IU] via SUBCUTANEOUS
  Administered 2019-08-27: 7 [IU] via SUBCUTANEOUS
  Administered 2019-08-28: 3 [IU] via SUBCUTANEOUS
  Administered 2019-08-28 (×4): 7 [IU] via SUBCUTANEOUS
  Administered 2019-08-28: 4 [IU] via SUBCUTANEOUS
  Administered 2019-08-29: 3 [IU] via SUBCUTANEOUS
  Administered 2019-08-29: 4 [IU] via SUBCUTANEOUS
  Administered 2019-08-30 – 2019-09-01 (×5): 3 [IU] via SUBCUTANEOUS
  Administered 2019-09-02 (×3): 4 [IU] via SUBCUTANEOUS
  Administered 2019-09-02: 3 [IU] via SUBCUTANEOUS
  Administered 2019-09-03 (×5): 4 [IU] via SUBCUTANEOUS
  Administered 2019-09-03: 7 [IU] via SUBCUTANEOUS
  Administered 2019-09-04 – 2019-09-05 (×7): 4 [IU] via SUBCUTANEOUS
  Administered 2019-09-05: 3 [IU] via SUBCUTANEOUS
  Administered 2019-09-05: 4 [IU] via SUBCUTANEOUS
  Filled 2019-08-09 (×114): qty 1

## 2019-08-09 MED ORDER — INSULIN ASPART 100 UNIT/ML ~~LOC~~ SOLN
0.0000 [IU] | SUBCUTANEOUS | Status: DC
  Administered 2019-08-09: 7 [IU] via SUBCUTANEOUS
  Filled 2019-08-09: qty 1

## 2019-08-09 MED ORDER — VITAL HIGH PROTEIN PO LIQD
1000.0000 mL | ORAL | Status: DC
  Administered 2019-08-09 – 2019-08-14 (×6): 1000 mL

## 2019-08-09 NOTE — Progress Notes (Signed)
Assumed care of patient from Karrie Stipanov, RN at 2315. 

## 2019-08-09 NOTE — Progress Notes (Signed)
[facility] Cardiology    SUBJECTIVE: The patient remains intubated and sedated.   Vitals:   08/09/19 0500 08/09/19 0530 08/09/19 0600 08/09/19 0700  BP: 125/81 (!) 147/75 (!) 163/80 (!) 178/144  Pulse: 96 94 89 66  Resp: 20 18 17 17   Temp: 99.3 F (37.4 C) 99.3 F (37.4 C) 99.3 F (37.4 C) 99.3 F (37.4 C)  TempSrc:    Bladder  SpO2: 98% 97% 97% 99%  Weight:      Height:         Intake/Output Summary (Last 24 hours) at 08/09/2019 0846 Last data filed at 08/09/2019 0700 Gross per 24 hour  Intake 716.48 ml  Output 1475 ml  Net -758.52 ml      PHYSICAL EXAM  General:Critically ill appearing, intubatedin no acute distress HEENT:Normocephalic and atraumatic. Lips swollen Neck: No JVD.  Lungs:ventilator support Heart:irregularly irregular, no murmurs, rubs, or gallops Abdomen:no obvious distention MSK: no obvious deformities Extremities:bilateral pedaledema.  Neuro:sedated    LABS: Basic Metabolic Panel: Recent Labs    08/08/19 0415 08/09/19 0256  NA 143 145  K 4.6 5.0  CL 108 110  CO2 24 22  GLUCOSE 233* 257*  BUN 98* 88*  CREATININE 1.30* 1.17*  CALCIUM 8.9 9.0  MG 3.3* 3.5*  PHOS 3.8 3.3   Liver Function Tests: Recent Labs    08/08/19 0415 08/09/19 0256  ALBUMIN 2.6* 2.7*   No results for input(s): LIPASE, AMYLASE in the last 72 hours. CBC: Recent Labs    08/08/19 0415 08/09/19 0256  WBC 22.1* 26.9*  NEUTROABS 16.4* 21.9*  HGB 14.4 14.9  HCT 44.0 44.1  MCV 88.7 87.3  PLT 208 211   Cardiac Enzymes: No results for input(s): CKTOTAL, CKMB, CKMBINDEX, TROPONINI in the last 72 hours. BNP: Invalid input(s): POCBNP D-Dimer: No results for input(s): DDIMER in the last 72 hours. Hemoglobin A1C: No results for input(s): HGBA1C in the last 72 hours. Fasting Lipid Panel: Recent Labs    08/09/19 0256  TRIG 153*   Thyroid Function Tests: No results for input(s): TSH, T4TOTAL, T3FREE, THYROIDAB in the last 72 hours.  Invalid  input(s): FREET3 Anemia Panel: No results for input(s): VITAMINB12, FOLATE, FERRITIN, TIBC, IRON, RETICCTPCT in the last 72 hours.  DG Chest Port 1 View  Result Date: 08/08/2019 CLINICAL DATA:  Hypoxia EXAM: PORTABLE CHEST 1 VIEW COMPARISON:  August 05, 2019. FINDINGS: Endotracheal tube tip is 3.4 cm above the carina. Nasogastric tube tip and side port are in the stomach. No pneumothorax. Lungs are clear. Heart is mildly enlarged with pulmonary vascularity normal. No adenopathy. There is aortic atherosclerosis. No bone lesions. IMPRESSION: Tube positions as described without pneumothorax. Lungs clear. Stable cardiac prominence. Aortic Atherosclerosis (ICD10-I70.0). Electronically Signed   By: August 07, 2019 III M.D.   On: 08/08/2019 07:18   TELEMETRY: atrial fibrillation, rates 120s  ASSESSMENT AND PLAN:  Active Problems:   Angioedema    1. Atrial fibrillation with RVR, likely due to reflex tachycardia since holding home sotalol and carvedilol. Patient is not on chronic anticoagulation per outpatient cardiologist. Patient was started on amiodarone drip, which was discontinued due to history of amiodarone-induced thyroiditis, and started on cardizem drip with improvement of rate to 70s-80s. Patient is coming out of sedation this morning, and heart rates are in 120s. Cardizem drip rate increased to 15 mg/hr 2. Angioedema with respiratory failure, currently improving after FFP, remains intubated and sedated 3. History of coronary artery disease, with history of NSTEMI 4. Chronic diastolic CHF  Recommendations: 1. Continue Cardizem drip for now 2. Recommend resuming carvedilol 12.5 mg BID and sotalol 40 mg BID when patient is extubated. 3. Defer chronic anticoagulation per outpatient cardiologist 4. IV Lasix PRN   [name], PA-C 08/09/2019 8:46 AM

## 2019-08-09 NOTE — Progress Notes (Signed)
CRITICAL CARE NOTE 7/16 admitted to ICU for severe angioedema 7/17 severe angioedema, resp failure 7/18 severe angioedema, resp failure 7/19 severe andioedema, resp failure 7/20 severe angioedema, resp failure 08/03/19- Daughter [name] is at bedside, we discussed hospital course and care plan together. Questions answered. 08/04/19- patient is s/p FFP with significant improvement of severely swollen lips/tongue. 08/05/19 - patient has small cuff leak this am with overall improvement in a lingual and orofacial swelling however still has significant angioedema. 08/06/19-patient had additional improvement in angioedema. She had episode of AFrVR while family was present and they did say this happens to her occasionally, this was remedied with 2.5 lopressor x1 and po maintance metop 12.2bid.  7/26 cardiology consulted for afib, remains encephlopathic 7/27 remains encephalopathic   CC  follow up respiratory failure  SUBJECTIVE Patient remains critically ill Prognosis is guarded   BP (!) 178/144   Pulse 66   Temp 99.3 F (37.4 C) (Bladder)   Resp 17   Ht 5' 2.01" (1.575 m)   Wt 84.4 kg   LMP  (LMP Unknown)   SpO2 99%   BMI 34.02 kg/m    I/O last 3 completed shifts: In: 1313.6 [I.V.:623.6; NG/GT:640; IV Piggyback:50] Out: 2920 [Urine:2920] No intake/output data recorded.  SpO2: 99 % FiO2 (%): 21 %  Estimated body mass index is 34.02 kg/m as calculated from the following:   Height as of this encounter: 5' 2.01" (1.575 m).   Weight as of this encounter: 84.4 kg.  SIGNIFICANT EVENTS   REVIEW OF SYSTEMS  PATIENT IS UNABLE TO PROVIDE COMPLETE REVIEW OF SYSTEMS DUE TO SEVERE CRITICAL ILLNESS        PHYSICAL EXAMINATION:  GENERAL:critically ill appearing, +resp distress HEAD: Normocephalic, atraumatic.  EYES: Pupils equal, round, reactive to light.  No scleral icterus.  MOUTH: Moist mucosal membrane. NECK: Supple.  PULMONARY: +rhonchi, +wheezing CARDIOVASCULAR: S1  and S2. Regular rate and rhythm. No murmurs, rubs, or gallops.  GASTROINTESTINAL: Soft, nontender, -distended.  Positive bowel sounds.   MUSCULOSKELETAL: No swelling, clubbing, or edema.  NEUROLOGIC: obtunded, GCS<8 SKIN:intact,warm,dry  MEDICATIONS: I have reviewed all medications and confirmed regimen as documented   Nutrition Status: Nutrition Problem: Inadequate oral intake Etiology: inability to eat (pt sedated and ventilated) Signs/Symptoms: NPO status Interventions: Tube feeding     Indwelling Urinary Catheter continued, requirement due to   Reason to continue Indwelling Urinary Catheter strict Intake/Output monitoring for hemodynamic instability         Ventilator continued, requirement due to severe respiratory failure   Ventilator Sedation RASS 0 to -2      ASSESSMENT AND PLAN SYNOPSIS  Severe ACUTE Hypoxic and Hypercapnic Respiratory Failurefrom severe angioedemaandsevere airway obstruction  Severe ACUTE Hypoxic and Hypercapnic Respiratory Failure -continue Full MV support -continue Bronchodilator Therapy -Wean Fio2 and PEEP as tolerated -will perform SAT/SBT when respiratory parameters are met -VAP/VENT bundle implementation  ACUTE KIDNEY INJURY/Renal Failure -continue Foley Catheter-assess need -Avoid nephrotoxic agents -Follow urine output, BMP -Ensure adequate renal perfusion, optimize oxygenation -Renal dose medications     NEUROLOGY Off all sedation, may need to consider CT/MRI brain to assess for CVA if patient does not wake up in 24 hrs Off sedation now 48 hrs  CARDIAC ICU monitoring  GI GI PROPHYLAXIS as indicated  DIET-->TF's as tolerated Constipation protocol as indicated  ENDO - will use ICU hypoglycemic\Hyperglycemia protocol if indicated     ELECTROLYTES -follow labs as needed -replace as needed -pharmacy consultation and following   DVT/GI PRX ordered and assessed  TRANSFUSIONS AS NEEDED MONITOR FSBS I Assessed  the need for Labs I Assessed the need for Foley I Assessed the need for Central Venous Line Family Discussion when available I Assessed the need for Mobilization I made an Assessment of medications to be adjusted accordingly Safety Risk assessment completed   CASE DISCUSSED IN MULTIDISCIPLINARY ROUNDS WITH ICU TEAM  Critical Care Time devoted to patient care services described in this note is 32 minutes.   Overall, patient is critically ill, prognosis is guarded.  Patient with Multiorgan failure and at high risk for cardiac arrest and death.    Corrin Parker, M.D.  [facility]  Medical Director [place] Director [facility] Cardio-Pulmonary Department

## 2019-08-09 NOTE — Progress Notes (Signed)
Nutrition Follow-up  DOCUMENTATION CODES:   Obesity unspecified  INTERVENTION:  Initiate new goal TF regimen of Vital High Protein at 50 mL/hr (1200 mL goal daily volume). Provides 1200 kcal, 105 grams of protein, 1008 mL H2O daily.  Continue MVI daily per tube.  NUTRITION DIAGNOSIS:   Inadequate oral intake related to inability to eat (pt sedated and ventilated) as evidenced by NPO status.  Ongoing.  GOAL:   Provide needs based on ASPEN/SCCM guidelines  Met with TF regimen.  MONITOR:   Vent status, Labs, Weight trends, Skin, I & O's, TF tolerance  REASON FOR ASSESSMENT:   Ventilator    ASSESSMENT:   85 y/o female with h/o HTN, CHF, CAD, dementia, MI and hyperthyroidism who is admitted with angioedema requiring intubation.  Patient is currently intubated on ventilator support MV: 8.7 L/min Temp (24hrs), Avg:99.3 F (37.4 C), Min:99 F (37.2 C), Max:100 F (37.8 C)  Medications reviewed and include: Decadron 4 mg Q12hrs IV, Benadryl, Colace 100 mg BID, Novolog 0-20 units Q4hrs, MVI daily per tube, Miralax, 17 grams daily, famotidine.  Labs reviewed: CBG 215-243, BUN 88, Creatinine 1.17.  I/O: 1475 mL UOP yesterday (0.7 mL/kg/hr)  Weight trend: 84.4 kg on 7/28; -5.3 kg from 7/16  Enteral Access: OGT placed 7/16; per chest x-ray 7/28 tip and side port are in the stomach  Discussed with RN and on rounds. Patient tolerating TF regimen. Patient now off propofol gtt.  Diet Order:   Diet Order            Diet NPO time specified  Diet effective now                EDUCATION NEEDS:   No education needs have been identified at this time  Skin:  Skin Assessment: Reviewed RN Assessment (MASD to groin)  Last BM:  Unknown  Height:   Ht Readings from Last 1 Encounters:  08/02/19 5' 2.01" (1.575 m)   Weight:   Wt Readings from Last 1 Encounters:  08/09/19 84.4 kg   Ideal Body Weight:  50 kg  BMI:  Body mass index is 34.02 kg/m.  Estimated  Nutritional Needs:   Kcal:  980-1247kcal/day  Protein:  >100g/day  Fluid:  1.5L/day  Jacklynn Barnacle, MS, RD, LDN Pager number available on Amion

## 2019-08-09 NOTE — Plan of Care (Signed)

## 2019-08-10 ENCOUNTER — Inpatient Hospital Stay: Payer: Medicare Other

## 2019-08-10 DIAGNOSIS — J9601 Acute respiratory failure with hypoxia: Secondary | ICD-10-CM | POA: Diagnosis not present

## 2019-08-10 DIAGNOSIS — T783XXA Angioneurotic edema, initial encounter: Secondary | ICD-10-CM | POA: Diagnosis not present

## 2019-08-10 LAB — RENAL FUNCTION PANEL
Albumin: 2.7 g/dL — ABNORMAL LOW (ref 3.5–5.0)
Anion gap: 12 (ref 5–15)
BUN: 98 mg/dL — ABNORMAL HIGH (ref 8–23)
CO2: 22 mmol/L (ref 22–32)
Calcium: 9 mg/dL (ref 8.9–10.3)
Chloride: 113 mmol/L — ABNORMAL HIGH (ref 98–111)
Creatinine, Ser: 1.18 mg/dL — ABNORMAL HIGH (ref 0.44–1.00)
GFR calc Af Amer: 50 mL/min — ABNORMAL LOW (ref >60)
GFR calc non Af Amer: 43 mL/min — ABNORMAL LOW (ref >60)
Glucose, Bld: 214 mg/dL — ABNORMAL HIGH (ref 70–99)
Phosphorus: 3.4 mg/dL (ref 2.5–4.6)
Potassium: 4.7 mmol/L (ref 3.5–5.1)
Sodium: 147 mmol/L — ABNORMAL HIGH (ref 135–145)

## 2019-08-10 LAB — GLUCOSE, CAPILLARY
Glucose-Capillary: 205 mg/dL — ABNORMAL HIGH (ref 70–99)
Glucose-Capillary: 163 mg/dL — ABNORMAL HIGH (ref 70–99)
Glucose-Capillary: 137 mg/dL — ABNORMAL HIGH (ref 70–99)
Glucose-Capillary: 151 mg/dL — ABNORMAL HIGH (ref 70–99)
Glucose-Capillary: 174 mg/dL — ABNORMAL HIGH (ref 70–99)

## 2019-08-10 LAB — CBC WITH DIFFERENTIAL/PLATELET
Abs Immature Granulocytes: 0.85 10*3/uL — ABNORMAL HIGH (ref 0.00–0.07)
Basophils Absolute: 0.1 10*3/uL (ref 0.0–0.1)
Basophils Relative: 0 %
Eosinophils Absolute: 0 10*3/uL (ref 0.0–0.5)
Eosinophils Relative: 0 %
HCT: 43.5 % (ref 36.0–46.0)
Hemoglobin: 14.4 g/dL (ref 12.0–15.0)
Immature Granulocytes: 3 %
Lymphocytes Relative: 5 %
Lymphs Abs: 1.3 10*3/uL (ref 0.7–4.0)
MCH: 29.3 pg (ref 26.0–34.0)
MCHC: 33.1 g/dL (ref 30.0–36.0)
MCV: 88.4 fL (ref 80.0–100.0)
Monocytes Absolute: 1.5 10*3/uL — ABNORMAL HIGH (ref 0.1–1.0)
Monocytes Relative: 6 %
Neutro Abs: 22.5 10*3/uL — ABNORMAL HIGH (ref 1.7–7.7)
Neutrophils Relative %: 86 %
Platelets: 218 10*3/uL (ref 150–400)
RBC: 4.92 MIL/uL (ref 3.87–5.11)
RDW: 13.8 % (ref 11.5–15.5)
Smear Review: NORMAL
WBC: 26.2 10*3/uL — ABNORMAL HIGH (ref 4.0–10.5)
nRBC: 0 % (ref 0.0–0.2)

## 2019-08-10 LAB — TRIGLYCERIDES: Triglycerides: 101 mg/dL (ref <150)

## 2019-08-10 LAB — MAGNESIUM: Magnesium: 3.6 mg/dL — ABNORMAL HIGH (ref 1.7–2.4)

## 2019-08-10 MED ORDER — FREE WATER
100.0000 mL | Status: DC
  Administered 2019-08-10 – 2019-08-11 (×8): 100 mL

## 2019-08-10 NOTE — Progress Notes (Signed)
Remains on ventilator un-sedated and responds only to pain and extreme movement. Opens eyes to movement. Tolerating tube feeding. Output ok. MRI done.Talked with family about code status and what patient actually wants. Oldest daughter she lives with does not understand life support vs resuscitation. Family unwilling to discuss.

## 2019-08-10 NOTE — Progress Notes (Signed)
Pt was transported to MRI from CCU and back while on the vent. ?

## 2019-08-10 NOTE — Progress Notes (Signed)
[id number] Down to MRI and back to room.

## 2019-08-10 NOTE — Progress Notes (Signed)
CRITICAL CARE NOTE 7/16 admitted to ICU for severe angioedema 7/17 severe angioedema, resp failure 7/18 severe angioedema, resp failure 7/19 severe andioedema, resp failure 7/20 severe angioedema, resp failure 08/03/19- Daughter [name] is at bedside, we discussed hospital course and care plan together. Questions answered. 08/04/19- patient is s/p FFP with significant improvement of severely swollen lips/tongue. 08/05/19 - patient has small cuff leak this am with overall improvement in a lingual and orofacial swelling however still has significant angioedema. 08/06/19-patient had additional improvement in angioedema. She had episode of AFrVR while family was present and they did say this happens to her occasionally, this was remedied with 2.5 lopressor x1 and po maintance metop 12.2bid. 7/26 cardiology consulted for afib, remains encephlopathic 7/27 remains encephalopathic 7/29 remains encephalopathic obtain MRI     CC  follow up respiratory failure  SUBJECTIVE Patient remains critically ill Prognosis is guarded   BP (!) 137/75   Pulse 84   Temp 99.5 F (37.5 C)   Resp 17   Ht 5' 2.01" (1.575 m)   Wt 83.8 kg   LMP  (LMP Unknown)   SpO2 97%   BMI 33.78 kg/m    I/O last 3 completed shifts: In: 1644.9 [I.V.:676.6; NG/GT:918.3; IV Piggyback:50] Out: 2926 [Urine:2925; Stool:1] No intake/output data recorded.  SpO2: 97 % FiO2 (%): 21 %  Estimated body mass index is 33.78 kg/m as calculated from the following:   Height as of this encounter: 5' 2.01" (1.575 m).   Weight as of this encounter: 83.8 kg.  SIGNIFICANT EVENTS   REVIEW OF SYSTEMS  PATIENT IS UNABLE TO PROVIDE COMPLETE REVIEW OF SYSTEMS DUE TO SEVERE CRITICAL ILLNESS        PHYSICAL EXAMINATION:  GENERAL:critically ill appearing, +resp distress HEAD: Normocephalic, atraumatic.  EYES: Pupils equal, round, reactive to light.  No scleral icterus.  MOUTH: Moist mucosal membrane. NECK: Supple.   PULMONARY: +rhonchi, +wheezing CARDIOVASCULAR: S1 and S2. Regular rate and rhythm. No murmurs, rubs, or gallops.  GASTROINTESTINAL: Soft, nontender, -distended.  Positive bowel sounds.   MUSCULOSKELETAL: No swelling, clubbing, or edema.  NEUROLOGIC: obtunded, GCS<8 SKIN:intact,warm,dry  MEDICATIONS: I have reviewed all medications and confirmed regimen as documented   CULTURE RESULTS   No results found for this or any previous visit (from the past 240 hour(s)).        IMAGING    DG Chest Port 1 View  Result Date: 08/10/2019 CLINICAL DATA:  Hypoxia EXAM: PORTABLE CHEST 1 VIEW COMPARISON:  August 08, 2019 FINDINGS: Endotracheal tube tip is 4.2 cm above the carina. Nasogastric tube tip and side port are below the diaphragm. No pneumothorax. There is atelectatic change in the left mid lung. Lungs elsewhere are clear. Heart is mildly enlarged with pulmonary vascularity normal. No adenopathy. There is aortic atherosclerosis. No bone lesions. IMPRESSION: Tube positions as described without pneumothorax. Atelectatic change left mid lung. No edema or airspace opacity. Stable cardiac prominence. Aortic Atherosclerosis (ICD10-I70.0). Electronically Signed   By: Lowella Grip III M.D.   On: 08/10/2019 05:02     Nutrition Status: Nutrition Problem: Inadequate oral intake Etiology: inability to eat (pt sedated and ventilated) Signs/Symptoms: NPO status Interventions: Tube feeding   BMP Latest Ref Rng & Units 08/10/2019 08/09/2019 08/08/2019  Glucose 70 - 99 mg/dL 214(H) 257(H) 233(H)  BUN 8 - 23 mg/dL 98(H) 88(H) 98(H)  Creatinine 0.44 - 1.00 mg/dL 1.18(H) 1.17(H) 1.30(H)  BUN/Creat Ratio 6 - 22 (calc) - - -  Sodium 135 - 145 mmol/L 147(H) 145 143  Potassium 3.5 - 5.1 mmol/L 4.7 5.0 4.6  Chloride 98 - 111 mmol/L 113(H) 110 108  CO2 22 - 32 mmol/L 22 22 24   Calcium 8.9 - 10.3 mg/dL 9.0 9.0 8.9     Indwelling Urinary Catheter continued, requirement due to   Reason to continue  Indwelling Urinary Catheter strict Intake/Output monitoring for hemodynamic instability         Ventilator continued, requirement due to severe respiratory failure   Ventilator Sedation RASS 0 to -2      ASSESSMENT AND PLAN SYNOPSIS Severe ACUTE Hypoxic and Hypercapnic Respiratory Failurefrom severe angioedemaandsevere airway obstruction, with severe metabolic encephalopathy   Severe ACUTE Hypoxic and Hypercapnic Respiratory Failure -continue Full MV support -continue Bronchodilator Therapy -Wean Fio2 and PEEP as tolerated -will perform SAT/SBT when respiratory parameters are met -VAP/VENT bundle implementation   ACUTE KIDNEY INJURY/Renal Failure -continue Foley Catheter-assess need -Avoid nephrotoxic agents -Follow urine output, BMP -Ensure adequate renal perfusion, optimize oxygenation -Renal dose medications     NEUROLOGY Acute toxic metabolic encephalopathy, need for sedation Goal RASS -2 to -3 Plan for MRI   CARDIAC ICU monitoring   GI GI PROPHYLAXIS as indicated  DIET-->TF's as tolerated Constipation protocol as indicated  ENDO - will use ICU hypoglycemic\Hyperglycemia protocol if indicated     ELECTROLYTES -follow labs as needed -replace as needed -pharmacy consultation and following   DVT/GI PRX ordered and assessed TRANSFUSIONS AS NEEDED MONITOR FSBS I Assessed the need for Labs I Assessed the need for Foley I Assessed the need for Central Venous Line Family Discussion when available I Assessed the need for Mobilization I made an Assessment of medications to be adjusted accordingly Safety Risk assessment completed   CASE DISCUSSED IN MULTIDISCIPLINARY ROUNDS WITH ICU TEAM  Critical Care Time devoted to patient care services described in this note is 32 minutes.   Overall, patient is critically ill, prognosis is guarded.  Patient with Multiorgan failure and at high risk for cardiac arrest and death.    Corrin Parker, M.D.   [facility]  Medical Director [facility] Cardio-Pulmonary Department

## 2019-08-10 NOTE — Progress Notes (Signed)
[facility] Cardiology    SUBJECTIVE: Patient remains encephalopathic. Unable to perform ROS.   Vitals:   08/10/19 0300 08/10/19 0400 08/10/19 0500 08/10/19 0600  BP: (!) 176/84 (!) 161/79 (!) 139/74 (!) 137/75  Pulse: 91 89 71 84  Resp: 19 18 17 17   Temp: 99.5 F (37.5 C) 99.5 F (37.5 C) 99.5 F (37.5 C) 99.5 F (37.5 C)  TempSrc:  Bladder    SpO2: 96% 97% 96% 97%  Weight:   83.8 kg   Height:         Intake/Output Summary (Last 24 hours) at 08/10/2019 0758 Last data filed at 08/10/2019 0600 Gross per 24 hour  Intake 1418.16 ml  Output 2051 ml  Net -632.84 ml      PHYSICAL EXAM  General:Critically ill appearing, intubatedin no acute distress HEENT:Normocephalic and atraumatic. Minimal lip swelling. Neck: No JVD.  Lungs:ventilator support Heart:irregularly irregular, no murmurs, rubs, or gallops Abdomen:no obvious distention MSK: no obvious deformities Extremities:Trivial bilateral pedaledema.  Neuro:obtunded   LABS: Basic Metabolic Panel: Recent Labs    08/09/19 0256 08/10/19 0353  NA 145 147*  K 5.0 4.7  CL 110 113*  CO2 22 22  GLUCOSE 257* 214*  BUN 88* 98*  CREATININE 1.17* 1.18*  CALCIUM 9.0 9.0  MG 3.5* 3.6*  PHOS 3.3 3.4   Liver Function Tests: Recent Labs    08/09/19 0256 08/10/19 0353  ALBUMIN 2.7* 2.7*   No results for input(s): LIPASE, AMYLASE in the last 72 hours. CBC: Recent Labs    08/09/19 0256 08/10/19 0353  WBC 26.9* 26.2*  NEUTROABS 21.9* 22.5*  HGB 14.9 14.4  HCT 44.1 43.5  MCV 87.3 88.4  PLT 211 218   Cardiac Enzymes: No results for input(s): CKTOTAL, CKMB, CKMBINDEX, TROPONINI in the last 72 hours. BNP: Invalid input(s): POCBNP D-Dimer: No results for input(s): DDIMER in the last 72 hours. Hemoglobin A1C: Recent Labs    08/09/19 0256  HGBA1C 5.9*   Fasting Lipid Panel: Recent Labs    08/10/19 0353  TRIG 101   Thyroid Function Tests: No results for input(s): TSH, T4TOTAL, T3FREE, THYROIDAB in  the last 72 hours.  Invalid input(s): FREET3 Anemia Panel: No results for input(s): VITAMINB12, FOLATE, FERRITIN, TIBC, IRON, RETICCTPCT in the last 72 hours.  DG Chest Port 1 View  Result Date: 08/10/2019 CLINICAL DATA:  Hypoxia EXAM: PORTABLE CHEST 1 VIEW COMPARISON:  August 08, 2019 FINDINGS: Endotracheal tube tip is 4.2 cm above the carina. Nasogastric tube tip and side port are below the diaphragm. No pneumothorax. There is atelectatic change in the left mid lung. Lungs elsewhere are clear. Heart is mildly enlarged with pulmonary vascularity normal. No adenopathy. There is aortic atherosclerosis. No bone lesions. IMPRESSION: Tube positions as described without pneumothorax. Atelectatic change left mid lung. No edema or airspace opacity. Stable cardiac prominence. Aortic Atherosclerosis (ICD10-I70.0). Electronically Signed   By: August 10, 2019 III M.D.   On: 08/10/2019 05:02     TELEMETRY: atrial fibrillation, rates 100-106 bpm  ASSESSMENT AND PLAN:  Active Problems:   Angioedema    1.Atrial fibrillation with RVR, likely due to reflex tachycardia since holding home sotalol and carvedilol. Patient is not on chronic anticoagulation per outpatient cardiologist.Patient was started on amiodarone drip, which was discontinued due to history of amiodarone-induced thyroiditis, and started on cardizem drip with improvement of rate to 70s-low 100s.  2.Angioedema with respiratory failure,currently improving after FFP, remains intubated and encephalopathic. MRI ordered. 3.History of coronary artery disease, with history of NSTEMI  4. Chronic diastolic CHF 5. Hypertension, mildly elevated this morning on hydralazine, Cardizem drip, and HCTZ.  Recommendations: 1. Continue Cardizem drip for now 2. Recommend resuming carvedilol 12.5 mg BID and sotalol 40 mg BID when patient is extubated. 3. Defer chronic anticoagulation to outpatient cardiologist 4. Await MRI results 5. No further cardiac  diagnostics at this time.   Melanie Ivanoff, PA-C 08/10/2019 7:58 AM   Discussed with Dr. Darrold Junker and Dr. Juliann Pares (patient's outpatient cardiologist) who agree with plan.

## 2019-08-11 DIAGNOSIS — G9341 Metabolic encephalopathy: Secondary | ICD-10-CM

## 2019-08-11 DIAGNOSIS — R569 Unspecified convulsions: Secondary | ICD-10-CM

## 2019-08-11 DIAGNOSIS — R4182 Altered mental status, unspecified: Secondary | ICD-10-CM

## 2019-08-11 LAB — TRIGLYCERIDES: Triglycerides: 153 mg/dL — ABNORMAL HIGH (ref <150)

## 2019-08-11 LAB — CBC WITH DIFFERENTIAL/PLATELET
Abs Immature Granulocytes: 0.39 10*3/uL — ABNORMAL HIGH (ref 0.00–0.07)
Basophils Absolute: 0 10*3/uL (ref 0.0–0.1)
Basophils Relative: 0 %
Eosinophils Absolute: 0 10*3/uL (ref 0.0–0.5)
Eosinophils Relative: 0 %
HCT: 44.1 % (ref 36.0–46.0)
Hemoglobin: 13.9 g/dL (ref 12.0–15.0)
Immature Granulocytes: 2 %
Lymphocytes Relative: 7 %
Lymphs Abs: 1.7 10*3/uL (ref 0.7–4.0)
MCH: 28.8 pg (ref 26.0–34.0)
MCHC: 31.5 g/dL (ref 30.0–36.0)
MCV: 91.5 fL (ref 80.0–100.0)
Monocytes Absolute: 2.4 10*3/uL — ABNORMAL HIGH (ref 0.1–1.0)
Monocytes Relative: 10 %
Neutro Abs: 19.2 10*3/uL — ABNORMAL HIGH (ref 1.7–7.7)
Neutrophils Relative %: 81 %
Platelets: 220 10*3/uL (ref 150–400)
RBC: 4.82 MIL/uL (ref 3.87–5.11)
RDW: 14 % (ref 11.5–15.5)
WBC: 23.7 10*3/uL — ABNORMAL HIGH (ref 4.0–10.5)
nRBC: 0 % (ref 0.0–0.2)

## 2019-08-11 LAB — CREATININE, SERUM
Creatinine, Ser: 1.26 mg/dL — ABNORMAL HIGH (ref 0.44–1.00)
GFR calc Af Amer: 46 mL/min — ABNORMAL LOW (ref >60)
GFR calc non Af Amer: 40 mL/min — ABNORMAL LOW (ref >60)

## 2019-08-11 LAB — T4, FREE: Free T4: 1.37 ng/dL — ABNORMAL HIGH (ref 0.61–1.12)

## 2019-08-11 LAB — GLUCOSE, CAPILLARY
Glucose-Capillary: 121 mg/dL — ABNORMAL HIGH (ref 70–99)
Glucose-Capillary: 122 mg/dL — ABNORMAL HIGH (ref 70–99)
Glucose-Capillary: 128 mg/dL — ABNORMAL HIGH (ref 70–99)
Glucose-Capillary: 136 mg/dL — ABNORMAL HIGH (ref 70–99)
Glucose-Capillary: 143 mg/dL — ABNORMAL HIGH (ref 70–99)
Glucose-Capillary: 144 mg/dL — ABNORMAL HIGH (ref 70–99)
Glucose-Capillary: 145 mg/dL — ABNORMAL HIGH (ref 70–99)

## 2019-08-11 LAB — RENAL FUNCTION PANEL
Albumin: 2.6 g/dL — ABNORMAL LOW (ref 3.5–5.0)
Anion gap: 9 (ref 5–15)
BUN: 97 mg/dL — ABNORMAL HIGH (ref 8–23)
CO2: 22 mmol/L (ref 22–32)
Calcium: 8.9 mg/dL (ref 8.9–10.3)
Chloride: 115 mmol/L — ABNORMAL HIGH (ref 98–111)
Creatinine, Ser: 1.19 mg/dL — ABNORMAL HIGH (ref 0.44–1.00)
GFR calc Af Amer: 49 mL/min — ABNORMAL LOW (ref >60)
GFR calc non Af Amer: 42 mL/min — ABNORMAL LOW (ref >60)
Glucose, Bld: 171 mg/dL — ABNORMAL HIGH (ref 70–99)
Phosphorus: 4 mg/dL (ref 2.5–4.6)
Potassium: 4.3 mmol/L (ref 3.5–5.1)
Sodium: 146 mmol/L — ABNORMAL HIGH (ref 135–145)

## 2019-08-11 LAB — TSH: TSH: <0.01 u[IU]/mL — ABNORMAL LOW (ref 0.350–4.500)

## 2019-08-11 LAB — AMMONIA: Ammonia: 29 umol/L (ref 9–35)

## 2019-08-11 LAB — MAGNESIUM: Magnesium: 3.6 mg/dL — ABNORMAL HIGH (ref 1.7–2.4)

## 2019-08-11 MED ORDER — SODIUM CHLORIDE 0.45 % IV SOLN: INTRAVENOUS | Status: DC

## 2019-08-11 MED ORDER — FREE WATER
200.0000 mL | Status: DC
  Administered 2019-08-11 – 2019-08-14 (×16): 200 mL

## 2019-08-11 MED ORDER — THIAMINE HCL 100 MG PO TABS
100.0000 mg | ORAL_TABLET | Freq: Every day | ORAL | Status: DC
  Administered 2019-08-11 – 2019-08-18 (×8): 100 mg
  Filled 2019-08-11 (×8): qty 1

## 2019-08-11 MED ORDER — PROPRANOLOL HCL 20 MG PO TABS
10.0000 mg | ORAL_TABLET | Freq: Three times a day (TID) | ORAL | Status: DC
  Administered 2019-08-11: 10 mg via ORAL
  Filled 2019-08-11: qty 1

## 2019-08-11 MED ORDER — PROPYLTHIOURACIL 50 MG PO TABS
50.0000 mg | ORAL_TABLET | Freq: Three times a day (TID) | ORAL | Status: DC
  Administered 2019-08-14 – 2019-08-18 (×13): 50 mg
  Filled 2019-08-11 (×15): qty 1

## 2019-08-11 MED ORDER — PROPRANOLOL HCL 20 MG PO TABS: 20.0000 mg | ORAL_TABLET | Freq: Three times a day (TID) | ORAL | Status: DC

## 2019-08-11 MED ORDER — MODAFINIL 100 MG PO TABS
100.0000 mg | ORAL_TABLET | Freq: Every day | ORAL | Status: DC
  Administered 2019-08-11: 100 mg via ORAL
  Filled 2019-08-11: qty 1

## 2019-08-11 MED ORDER — PROPYLTHIOURACIL 50 MG PO TABS
100.0000 mg | ORAL_TABLET | Freq: Three times a day (TID) | ORAL | Status: AC
  Administered 2019-08-11 – 2019-08-14 (×9): 100 mg
  Filled 2019-08-11 (×9): qty 2

## 2019-08-11 MED ORDER — PANTOPRAZOLE SODIUM 40 MG PO PACK
40.0000 mg | PACK | Freq: Every day | ORAL | Status: DC
  Administered 2019-08-12 – 2019-08-18 (×7): 40 mg
  Filled 2019-08-11 (×7): qty 20

## 2019-08-11 NOTE — Progress Notes (Signed)
Cardizem drip stopped per order.

## 2019-08-11 NOTE — Procedures (Signed)
Patient Name: SURABHI GADEA  MRN: [id number]  Epilepsy Attending: Charlsie Quest  Referring Physician/Provider: Dr Sarina Ser Date: 08/11/2019 Duration: 24.42 mins  Patient history:  85 y.o. female admitted 7/16 with severe angioedema. Pt found to have A-fib and is on Cardizem gtt for rate control. Pt is intubated off sedation and has not woken up.  EEG to evaluate for seizure  Level of alertness:  Comatose  AEDs during EEG study: None  Technical aspects: This EEG study was done with scalp electrodes positioned according to the 10-20 International system of electrode placement. Electrical activity was acquired at a sampling rate of 500Hz  and reviewed with a high frequency filter of 70Hz  and a low frequency filter of 1Hz . EEG data were recorded continuously and digitally stored.   Description: EEG showed continuous generalized 3 to 6 Hz theta-delta slowing. EEG ws reactive to noxious stimuli. Hyperventilation and photic stimulation were not performed.     ABNORMALITY -Continuous slow, generalized  IMPRESSION: This study is suggestive of severe diffuse encephalopathy, nonspecific etiology. No seizures or epileptiform discharges were seen throughout the recording.  Ambermarie Honeyman 

## 2019-08-11 NOTE — Consult Note (Signed)
[address] [place], [place] [address] Phone [phone]. Fax [phone]  Date: 08/11/2019                  Patient Name:  Melanie Lewis  MRN: [id number]  DOB: 1934/05/22  Age / Sex: 85 y.o., female         PCP: Leonel Ramsay, MD                 Service Requesting Consult: IM/ Flora Lipps, MD                 Reason for Consult: ARF            History of Present Illness: Patient is a 85 y.o. female with medical problems of CHF, CAD, Dementia, HTN, Hyperthyroidism, who was admitted to [facility] on 07/28/2019 for evaluation of Angioedema [T78.3XXA]  Suspected angioedema reaction to lisinopril  Other problems include A Fib, managed with cardizem Acute respiratory failure requiring vent assistance Off sedation, not waking up; neurology eval in progress  Nephrology consult for ARF  Baseline Cr to 0.91, peaked at 1.30 Trends reviewed Lab Results  Component Value Date   CREATININE 1.26 (H) 08/11/2019   CREATININE 1.19 (H) 08/11/2019   CREATININE 1.18 (H) 08/10/2019   07/29 0701 - 07/30 0700 In: 1904.2 [I.V.:454.2; NG/GT:1400; IV Piggyback:50] Out: 2125 [FWYOV:7858; Emesis/NG output:450]  Good UOP    Medications: Outpatient medications: Medications Prior to Admission  Medication Sig Dispense Refill Last Dose  . amiodarone (PACERONE) 200 MG tablet Take 100 mg by mouth daily.   07/28/2019 at 0930  . aspirin EC 81 MG tablet Take 81 mg by mouth daily.   07/28/2019 at 0930  . atorvastatin (LIPITOR) 10 MG tablet Take 1 tablet (10 mg total) by mouth daily. 90 tablet 1 07/28/2019 at 0930  . Biotin 1 MG CAPS Take by mouth.   07/28/2019 at 0930  . Calcium Carb-Cholecalciferol (CALCIUM 1000 + D PO) Calcium 600 mg + vit d 800 iu- 1 tab daily   07/28/2019 at 0930  . carvedilol (COREG) 12.5 MG tablet Take 12.5 mg by mouth 2 (two) times daily with a meal.   07/28/2019 at 0930  . Cholecalciferol (VITAMIN D3) 1000 units CAPS Take by mouth.   07/28/2019 at 0930  .  cloNIDine (CATAPRES) 0.1 MG tablet Take by mouth 2 (two) times daily.    07/28/2019 at 0930  . donepezil (ARICEPT) 10 MG tablet Take 1 tablet (10 mg total) by mouth at bedtime. 90 tablet 1 07/27/2019 at 2130  . hydrALAZINE (APRESOLINE) 25 MG tablet Take 25 mg by mouth 2 (two) times daily.   07/28/2019 at 0930  . lisinopril (ZESTRIL) 20 MG tablet TAKE 1 TABLET BY MOUTH EVERY DAY 90 tablet 0 07/28/2019 at 0930  . memantine (NAMENDA) 5 MG tablet Take 5 mg by mouth 2 (two) times daily.   07/28/2019 at 0930  . methimazole (TAPAZOLE) 5 MG tablet Take 0.5 tablets by mouth daily.    07/28/2019 at 0930  . potassium chloride SA (K-DUR) 20 MEQ tablet Take 1 tablet (20 mg total) by mouth every day (Patient taking differently: Take 20 mEq by mouth 2 (two) times daily. Take 1 tablet (20 mg total) by mouth every day) 30 tablet 0 07/28/2019 at 0930  . sertraline (ZOLOFT) 25 MG tablet Take 1 tablet (25 mg total) by mouth daily. 90 tablet 1 07/28/2019 at 0930  . sotalol (BETAPACE) 80 MG tablet Take 40 mg by mouth 2 (two)  times daily.    07/28/2019 at 0930  . torsemide (DEMADEX) 20 MG tablet Take 20 mg by mouth 2 (two) times daily.   07/28/2019 at 0930  . Vitamin D, Ergocalciferol, (DRISDOL) 1.25 MG (50000 UNIT) CAPS capsule Take 50,000 Units by mouth once a week.   Past Week at Unknown time    Current medications: Current Facility-Administered Medications  Medication Dose Route Frequency Provider Last Rate Last Admin  . 0.45 % sodium chloride infusion   Intravenous Continuous Vernard Gambles L, MD      . 0.9 %  sodium chloride infusion  250 mL Intravenous PRN Flora Lipps, MD 5 mL/hr at 08/10/19 1547 250 mL at 08/10/19 1547  . 0.9 %  sodium chloride infusion   Intravenous Continuous Ottie Glazier, MD 5 mL/hr at 08/11/19 0600 Rate Verify at 08/11/19 0600  . acetaminophen (TYLENOL) tablet 650 mg  650 mg Oral Q4H PRN Flora Lipps, MD      . chlorhexidine gluconate (MEDLINE KIT) (PERIDEX) 0.12 % solution 15 mL  15 mL Mouth  Rinse BID Flora Lipps, MD   15 mL at 08/11/19 0900  . Chlorhexidine Gluconate Cloth 2 % PADS 6 each  6 each Topical Daily Tukov-Yual, Magdalene S, NP   6 each at 08/11/19 1100  . diltiazem (CARDIZEM) 125 mg in dextrose 5% 125 mL (1 mg/mL) infusion  5-15 mg/hr Intravenous Continuous Clabe Seal, PA-C 13 mL/hr at 08/11/19 0600 13 mg/hr at 08/11/19 0600  . docusate (COLACE) 50 MG/5ML liquid 100 mg  100 mg Per Tube BID Flora Lipps, MD   100 mg at 08/11/19 1123  . enoxaparin (LOVENOX) injection 40 mg  40 mg Subcutaneous Q24H Flora Lipps, MD   40 mg at 08/11/19 1100  . feeding supplement (VITAL HIGH PROTEIN) liquid 1,000 mL  1,000 mL Per Tube Continuous Flora Lipps, MD 50 mL/hr at 08/11/19 0600 Rate Verify at 08/11/19 0600  . fentaNYL (SUBLIMAZE) bolus via infusion 25 mcg  25 mcg Intravenous Q1H PRN Duffy Bruce, MD   25 mcg at 07/31/19 0440  . fentaNYL 2524mg in NS 2580m(1055mml) infusion-PREMIX  25-200 mcg/hr Intravenous Continuous KasFlora LippsD   Stopped at 08/07/19 0719  . free water 200 mL  200 mL Per Tube Q4H GonVernard Gambles MD      . hydrALAZINE (APRESOLINE) injection 10 mg  10 mg Intravenous Q4H PRN Tukov-Yual, Magdalene S, NP   10 mg at 08/09/19 [id number] hydrALAZINE (APRESOLINE) tablet 25 mg  25 mg Per Tube Q8HBarnabas ListerD   25 mg at 08/11/19 0511  . insulin aspart (novoLOG) injection 0-20 Units  0-20 Units Subcutaneous Q4H KasFlora LippsD   3 Units at 08/11/19 0900  . LORazepam (ATIVAN) injection 2-4 mg  2-4 mg Intravenous Q1H PRN KasFlora LippsD   2 mg at 07/28/19 1809  . MEDLINE mouth rinse  15 mL Mouth Rinse 10 times per day KasFlora LippsD   15 mL at 08/11/19 1100  . multivitamin with minerals tablet 1 tablet  1 tablet Per Tube Daily AleOttie GlazierD   1 tablet at 08/11/19 1124  . ondansetron (ZOFRAN) injection 4 mg  4 mg Intravenous Q6H PRN KasFlora LippsD      . [STDerrill Memo 08/12/2019] pantoprazole sodium (PROTONIX) 40 mg/20 mL oral suspension 40 mg  40 mg Per Tube  Daily GonTyler PitaD      . polyethylene glycol (MIRALAX / GLYCOLAX) packet 17 g  17 g Oral  Daily PRN Flora Lipps, MD      . propofol (DIPRIVAN) 1000 MG/100ML infusion  0-50 mcg/kg/min Intravenous Continuous Flora Lipps, MD   Stopped at 08/07/19 [id number]  . propranolol (INDERAL) tablet 10 mg  10 mg Oral TID Tyler Pita, MD      . propylthiouracil (PTU) tablet 100 mg  100 mg Per Tube Q8H Tyler Pita, MD       Followed by  . [START ON 08/14/2019] propylthiouracil (PTU) tablet 50 mg  50 mg Per Tube Q8H Vernard Gambles L, MD      . sodium chloride flush (NS) 0.9 % injection 3 mL  3 mL Intravenous Q12H Flora Lipps, MD   3 mL at 08/10/19 2205  . sodium chloride flush (NS) 0.9 % injection 3 mL  3 mL Intravenous PRN Flora Lipps, MD      . thiamine tablet 100 mg  100 mg Per Tube Daily Tyler Pita, MD   100 mg at 08/11/19 1130      Allergies: Allergies  Allergen Reactions  . Lisinopril Swelling      Past Medical History: Past Medical History:  Diagnosis Date  . CHF (congestive heart failure) ([place])   . Coronary artery disease   . Dementia ([place])   . Heart attack ([place])   . Hypertension   . hyperthyroid   . Hyperthyroidism   . MI (myocardial infarction) [facility])      Past Surgical History: Past Surgical History:  Procedure Laterality Date  . CARDIAC CATHETERIZATION N/A 08/14/2014   Procedure: Left Heart Cath and Coronary Angiography;  Surgeon: Isaias Cowman, MD;  Location: [facility];  Service: Cardiovascular;  Laterality: N/A;  . CHOLECYSTECTOMY    . PARTIAL HYSTERECTOMY       Family History: Family History  Problem Relation Age of Onset  . Heart attack Sister   . Hypertension Sister   . Dementia Sister   . Heart attack Son   . Dementia Sister      Social History: Social History   Socioeconomic History  . Marital status: Widowed    Spouse name: Not on file  . Number of children: Not on file  . Years of education: Not on file  .  Highest education level: Not on file  Occupational History  . Not on file  Tobacco Use  . Smoking status: Never Smoker  . Smokeless tobacco: Never Used  Substance and Sexual Activity  . Alcohol use: Yes    Alcohol/week: 2.0 standard drinks    Types: 2 Shots of liquor per week    Comment: occasional  . Drug use: No  . Sexual activity: Not Currently  Other Topics Concern  . Not on file  Social History Narrative  . Not on file   Social Determinants of Health   Financial Resource Strain:   . Difficulty of Paying Living Expenses:   Food Insecurity:   . Worried About Charity fundraiser in the Last Year:   . Arboriculturist in the Last Year:   Transportation Needs:   . Film/video editor (Medical):   Marland Kitchen Lack of Transportation (Non-Medical):   Physical Activity:   . Days of Exercise per Week:   . Minutes of Exercise per Session:   Stress:   . Feeling of Stress :   Social Connections:   . Frequency of Communication with Friends and Family:   . Frequency of Social Gatherings with Friends and Family:   . Attends Religious  Services:   . Active Member of Clubs or Organizations:   . Attends Archivist Meetings:   Marland Kitchen Marital Status:   Intimate Partner Violence:   . Fear of Current or Ex-Partner:   . Emotionally Abused:   Marland Kitchen Physically Abused:   . Sexually Abused:      Review of Systems: not available Gen:  HEENT:  CV:  Resp:  GI: GU :  MS:  Derm:    Psych: Heme:  Neuro:  Endocrine  Vital Signs: Blood pressure (!) 142/73, pulse 79, temperature 98.2 F (36.8 C), temperature source Axillary, resp. rate 16, height 5' 2.01" (1.575 m), weight 83.8 kg, SpO2 96 %.   Intake/Output Summary (Last 24 hours) at 08/11/2019 1526 Last data filed at 08/11/2019 0600 Gross per 24 hour  Intake 1898.2 ml  Output 1675 ml  Net 223.2 ml    Weight trends: Filed Weights   08/08/19 0500 08/09/19 0436 08/10/19 0500  Weight: 84.9 kg 84.4 kg 83.8 kg    Physical  Exam: General:  Critically ill-appearing, lying in the bed  HEENT  ET tube, OG tube in place  Lungs:  Ventilator assisted  Heart::  Irregular, atrial fibrillation  Abdomen:  Soft, nondistended  Extremities:  Trace to 1+ dependent edema  Neurologic:  Not responding to voice or tactile stimuli  Skin:  Warm, dry  Foley:  In place with clear yellow urine       Lab results: Basic Metabolic Panel: Recent Labs  Lab 08/09/19 0256 08/10/19 0353 08/11/19 0422  NA 145 147* 146*  K 5.0 4.7 4.3  CL 110 113* 115*  CO2 22 22 22   GLUCOSE 257* 214* 171*  BUN 88* 98* 97*  CREATININE 1.17* 1.18* 1.19*  1.26*  CALCIUM 9.0 9.0 8.9  MG 3.5* 3.6* 3.6*  PHOS 3.3 3.4 4.0    Liver Function Tests: Recent Labs  Lab 08/11/19 0422  ALBUMIN 2.6*   No results for input(s): LIPASE, AMYLASE in the last 168 hours. Recent Labs  Lab 08/11/19 1025  AMMONIA 29    CBC: Recent Labs  Lab 08/10/19 0353 08/11/19 0422  WBC 26.2* 23.7*  NEUTROABS 22.5* 19.2*  HGB 14.4 13.9  HCT 43.5 44.1  MCV 88.4 91.5  PLT 218 220    Cardiac Enzymes: No results for input(s): CKTOTAL, TROPONINI in the last 168 hours.  BNP: Invalid input(s): POCBNP  CBG: Recent Labs  Lab 08/10/19 2026 08/11/19 0205 08/11/19 0405 08/11/19 0753 08/11/19 1308  GLUCAP 174* 144* 128* 121* 145*    Microbiology: Recent Results (from the past 720 hour(s))  SARS Coronavirus 2 by RT PCR (hospital order, performed in [facility] hospital lab) Nasopharyngeal Nasopharyngeal Swab     Status: None   Collection Time: 07/28/19  3:43 PM   Specimen: Nasopharyngeal Swab  Result Value Ref Range Status   SARS Coronavirus 2 NEGATIVE NEGATIVE Final    Comment: (NOTE) SARS-CoV-2 target nucleic acids are NOT DETECTED.  The SARS-CoV-2 RNA is generally detectable in upper and lower respiratory specimens during the acute phase of infection. The lowest concentration of SARS-CoV-2 viral copies this assay can detect is 250 copies / mL. A  negative result does not preclude SARS-CoV-2 infection and should not be used as the sole basis for treatment or other patient management decisions.  A negative result may occur with improper specimen collection / handling, submission of specimen other than nasopharyngeal swab, presence of viral mutation(s) within the areas targeted by this assay, and inadequate number of  viral copies (<250 copies / mL). A negative result must be combined with clinical observations, patient history, and epidemiological information.  Fact Sheet for Patients:   StrictlyIdeas.no  Fact Sheet for Healthcare Providers: BankingDealers.co.za  This test is not yet approved or  cleared by the [place] FDA and has been authorized for detection and/or diagnosis of SARS-CoV-2 by FDA under an Emergency Use Authorization (EUA).  This EUA will remain in effect (meaning this test can be used) for the duration of the COVID-19 declaration under Section 564(b)(1) of the Act, 21 U.S.C. section 360bbb-3(b)(1), unless the authorization is terminated or revoked sooner.  Performed at [facility], [address] [place], [place] [address]   MRSA PCR Screening     Status: None   Collection Time: 07/28/19  5:46 PM   Specimen: Nasal Mucosa; Nasopharyngeal  Result Value Ref Range Status   MRSA by PCR NEGATIVE NEGATIVE Final    Comment:        The GeneXpert MRSA Assay (FDA approved for NASAL specimens only), is one component of a comprehensive MRSA colonization surveillance program. It is not intended to diagnose MRSA infection nor to guide or monitor treatment for MRSA infections. Performed at [facility], [address] [place], [place] [address]      Coagulation Studies: No results for input(s): LABPROT, INR in the last 72 hours.  Urinalysis: No results for input(s): COLORURINE, LABSPEC, PHURINE, GLUCOSEU, HGBUR, BILIRUBINUR, KETONESUR,  PROTEINUR, UROBILINOGEN, NITRITE, LEUKOCYTESUR in the last 72 hours.  Invalid input(s): APPERANCEUR      Imaging: MR BRAIN WO CONTRAST  Result Date: 08/10/2019 CLINICAL DATA:  85 year old female with severe acute respiratory failure. Encephalopathy. EXAM: MRI HEAD WITHOUT CONTRAST TECHNIQUE: Multiplanar, multiecho pulse sequences of the brain and surrounding structures were obtained without intravenous contrast. COMPARISON:  None. FINDINGS: Brain: No restricted diffusion to suggest acute infarction. No midline shift, mass effect, evidence of mass lesion, ventriculomegaly, extra-axial collection or acute intracranial hemorrhage. Cervicomedullary junction and pituitary are within normal limits. Cerebral volume is within normal limits for age. Pearline Cables and white matter signal is largely normal for age throughout the brain. Probable right temporal lobe coronal fissure cyst, although with evidence of mild adjacent gliosis on FLAIR. No cortical encephalomalacia or chronic cerebral blood products. Vascular: Major intracranial vascular flow voids are preserved. Skull and upper cervical spine: Normal for age visible cervical spine. Visualized bone marrow signal is within normal limits. Sinuses/Orbits: Negative orbits. Inspissated secretions in the left maxillary and right sphenoid sinuses. No other sinus fluid levels. Other: Intubated. Small volume of fluid in the visible pharynx. Associated trace mastoid fluid. Grossly normal visible internal auditory structures. IMPRESSION: 1. No acute intracranial abnormality. 2. Largely normal for age noncontrast MRI appearance of the brain. Electronically Signed   By: Genevie Ann M.D.   On: 08/10/2019 15:07   DG Chest Port 1 View  Result Date: 08/10/2019 CLINICAL DATA:  Hypoxia EXAM: PORTABLE CHEST 1 VIEW COMPARISON:  August 08, 2019 FINDINGS: Endotracheal tube tip is 4.2 cm above the carina. Nasogastric tube tip and side port are below the diaphragm. No pneumothorax. There is  atelectatic change in the left mid lung. Lungs elsewhere are clear. Heart is mildly enlarged with pulmonary vascularity normal. No adenopathy. There is aortic atherosclerosis. No bone lesions. IMPRESSION: Tube positions as described without pneumothorax. Atelectatic change left mid lung. No edema or airspace opacity. Stable cardiac prominence. Aortic Atherosclerosis (ICD10-I70.0). Electronically Signed   By: Lowella Grip III M.D.   On:  08/10/2019 05:02      Assessment & Plan: Pt is a 85 y.o.   female with Atrial fibrillation, congestive heart failure, coronary artery disease, dementia, hypertension, hypothyroidism, was admitted on 07/28/2019 with Angioedema [T78.3XXA]   #Acute kidney injury Baseline creatinine of 0.91 on July 24.  Peak creatinine of 1.30 on July 27 Creatinine trends have been fluctuating throughout hospitalization Most recent creatinine of 1.19 However patient is azotemic with BUN of 97 Good urine output of 1600 to 3000 cc No renal imaging available No urinalysis available  We will order screening serologies, urinalysis, renal imaging ?  Encephalopathy related to uremia.  Will order 24-hour urine for creatinine clearance  #Acute respiratory failure Patient off sedation since 7/26 Not waking up.  neurology evaluation in progress     LOS: [place] 7/30/20213:26 PM    Note: This note was prepared with Dragon dictation. Any transcription errors are unintentional

## 2019-08-11 NOTE — Consult Note (Signed)
Reason for Consult: encephalopathy  Requesting Physician: Dr. Belia Heman  CC: encephalopathy   HPI: Melanie Lewis is an 85 y.o. female admitted 7/16 with severe angioedema. Pt found to have A-fib and is on Cardizem gtt for rate control. Pt is intubated off sedation and has not woken up.    Past Medical History:  Diagnosis Date  . CHF (congestive heart failure) (HCC)   . Coronary artery disease   . Dementia (HCC)   . Heart attack (HCC)   . Hypertension   . hyperthyroid   . Hyperthyroidism   . MI (myocardial infarction) [facility])     Past Surgical History:  Procedure Laterality Date  . CARDIAC CATHETERIZATION N/A 08/14/2014   Procedure: Left Heart Cath and Coronary Angiography;  Surgeon: Marcina Millard, MD;  Location: [facility] INVASIVE CV LAB;  Service: Cardiovascular;  Laterality: N/A;  . CHOLECYSTECTOMY    . PARTIAL HYSTERECTOMY      Family History  Problem Relation Age of Onset  . Heart attack Sister   . Hypertension Sister   . Dementia Sister   . Heart attack Son   . Dementia Sister     Social History:  reports that she has never smoked. She has never used smokeless tobacco. She reports current alcohol use of about 2.0 standard drinks of alcohol per week. She reports that she does not use drugs.  Allergies  Allergen Reactions  . Lisinopril Swelling    Medications: I have reviewed the patient's current medications.  ROS: Unable to obtain as not following commands  Physical Examination: Blood pressure (!) 155/76, pulse 79, temperature 98.1 F (36.7 C), temperature source Axillary, resp. rate 16, height 5' 2.01" (1.575 m), weight 83.8 kg, SpO2 96 %.  Pt doesn't follow commands Grimace to pain Dysconjugate gaze Doesn't track me through the room.   Laboratory Studies:   Basic Metabolic Panel: Recent Labs  Lab 08/07/19 0407 08/07/19 0407 08/08/19 0415 08/08/19 0415 08/09/19 0256 08/10/19 0353 08/11/19 0422  NA 145  --  143  --  145 147* 146*  K 4.8  --  4.6   --  5.0 4.7 4.3  CL 109  --  108  --  110 113* 115*  CO2 27  --  24  --  22 22 22   GLUCOSE 185*  --  233*  --  257* 214* 171*  BUN 102*  --  98*  --  88* 98* 97*  CREATININE 1.48*  --  1.30*  --  1.17* 1.18* 1.19*  1.26*  CALCIUM 8.4*   < > 8.9   < > 9.0 9.0 8.9  MG 3.2*  --  3.3*  --  3.5* 3.6* 3.6*  PHOS 4.9*  --  3.8  --  3.3 3.4 4.0   < > = values in this interval not displayed.    Liver Function Tests: Recent Labs  Lab 08/07/19 0407 08/08/19 0415 08/09/19 0256 08/10/19 0353 08/11/19 0422  ALBUMIN 2.4* 2.6* 2.7* 2.7* 2.6*   No results for input(s): LIPASE, AMYLASE in the last 168 hours. No results for input(s): AMMONIA in the last 168 hours.  CBC: Recent Labs  Lab 08/07/19 0407 08/08/19 0415 08/09/19 0256 08/10/19 0353 08/11/19 0422  WBC 17.7* 22.1* 26.9* 26.2* 23.7*  NEUTROABS 12.7* 16.4* 21.9* 22.5* 19.2*  HGB 13.4 14.4 14.9 14.4 13.9  HCT 39.5 44.0 44.1 43.5 44.1  MCV 86.1 88.7 87.3 88.4 91.5  PLT 168 208 211 218 220    Cardiac Enzymes: No results for  input(s): CKTOTAL, CKMB, CKMBINDEX, TROPONINI in the last 168 hours.  BNP: Invalid input(s): POCBNP  CBG: Recent Labs  Lab 08/10/19 1517 08/10/19 2026 08/11/19 0205 08/11/19 0405 08/11/19 0753  GLUCAP 151* 174* 144* 128* 121*    Microbiology: Results for orders placed or performed during the hospital encounter of 07/28/19  SARS Coronavirus 2 by RT PCR (hospital order, performed in [facility] hospital lab) Nasopharyngeal Nasopharyngeal Swab     Status: None   Collection Time: 07/28/19  3:43 PM   Specimen: Nasopharyngeal Swab  Result Value Ref Range Status   SARS Coronavirus 2 NEGATIVE NEGATIVE Final    Comment: (NOTE) SARS-CoV-2 target nucleic acids are NOT DETECTED.  The SARS-CoV-2 RNA is generally detectable in upper and lower respiratory specimens during the acute phase of infection. The lowest concentration of SARS-CoV-2 viral copies this assay can detect is 250 copies / mL. A negative  result does not preclude SARS-CoV-2 infection and should not be used as the sole basis for treatment or other patient management decisions.  A negative result may occur with improper specimen collection / handling, submission of specimen other than nasopharyngeal swab, presence of viral mutation(s) within the areas targeted by this assay, and inadequate number of viral copies (<250 copies / mL). A negative result must be combined with clinical observations, patient history, and epidemiological information.  Fact Sheet for Patients:   BoilerBrush.com.cy  Fact Sheet for Healthcare Providers: https://pope.com/  This test is not yet approved or  cleared by the [place] FDA and has been authorized for detection and/or diagnosis of SARS-CoV-2 by FDA under an Emergency Use Authorization (EUA).  This EUA will remain in effect (meaning this test can be used) for the duration of the COVID-19 declaration under Section 564(b)(1) of the Act, 21 U.S.C. section 360bbb-3(b)(1), unless the authorization is terminated or revoked sooner.  Performed at [facility], [address] [place], [place] [address]   MRSA PCR Screening     Status: None   Collection Time: 07/28/19  5:46 PM   Specimen: Nasal Mucosa; Nasopharyngeal  Result Value Ref Range Status   MRSA by PCR NEGATIVE NEGATIVE Final    Comment:        The GeneXpert MRSA Assay (FDA approved for NASAL specimens only), is one component of a comprehensive MRSA colonization surveillance program. It is not intended to diagnose MRSA infection nor to guide or monitor treatment for MRSA infections. Performed at [facility], [address] [place], [place] [address]     Coagulation Studies: No results for input(s): LABPROT, INR in the last 72 hours.  Urinalysis: No results for input(s): COLORURINE, LABSPEC, PHURINE, GLUCOSEU, HGBUR, BILIRUBINUR, KETONESUR, PROTEINUR,  UROBILINOGEN, NITRITE, LEUKOCYTESUR in the last 168 hours.  Invalid input(s): APPERANCEUR  Lipid Panel:     Component Value Date/Time   CHOL 277 (H) 04/16/2017 1112   CHOL 155 02/08/2014 0350   TRIG 153 (H) 08/11/2019 0422   TRIG 69 02/08/2014 0350   HDL 83 04/16/2017 1112   HDL 52 02/08/2014 0350   CHOLHDL 3.3 04/16/2017 1112   VLDL 14 02/08/2014 0350   LDLCALC 174 (H) 04/16/2017 1112   LDLCALC 89 02/08/2014 0350    HgbA1C:  Lab Results  Component Value Date   HGBA1C 5.9 (H) 08/09/2019    Urine Drug Screen:  No results found for: LABOPIA, COCAINSCRNUR, LABBENZ, AMPHETMU, THCU, LABBARB  Alcohol Level: No results for input(s): ETH in the last 168 hours.  Other results: A-fib with RVR  Imaging: MR BRAIN WO CONTRAST  Result Date: 08/10/2019 CLINICAL DATA:  85 year old female with severe acute respiratory failure. Encephalopathy. EXAM: MRI HEAD WITHOUT CONTRAST TECHNIQUE: Multiplanar, multiecho pulse sequences of the brain and surrounding structures were obtained without intravenous contrast. COMPARISON:  None. FINDINGS: Brain: No restricted diffusion to suggest acute infarction. No midline shift, mass effect, evidence of mass lesion, ventriculomegaly, extra-axial collection or acute intracranial hemorrhage. Cervicomedullary junction and pituitary are within normal limits. Cerebral volume is within normal limits for age. Wallace Cullens and white matter signal is largely normal for age throughout the brain. Probable right temporal lobe coronal fissure cyst, although with evidence of mild adjacent gliosis on FLAIR. No cortical encephalomalacia or chronic cerebral blood products. Vascular: Major intracranial vascular flow voids are preserved. Skull and upper cervical spine: Normal for age visible cervical spine. Visualized bone marrow signal is within normal limits. Sinuses/Orbits: Negative orbits. Inspissated secretions in the left maxillary and right sphenoid sinuses. No other sinus fluid levels.  Other: Intubated. Small volume of fluid in the visible pharynx. Associated trace mastoid fluid. Grossly normal visible internal auditory structures. IMPRESSION: 1. No acute intracranial abnormality. 2. Largely normal for age noncontrast MRI appearance of the brain. Electronically Signed   By: Odessa Fleming M.D.   On: 08/10/2019 15:07   DG Chest Port 1 View  Result Date: 08/10/2019 CLINICAL DATA:  Hypoxia EXAM: PORTABLE CHEST 1 VIEW COMPARISON:  August 08, 2019 FINDINGS: Endotracheal tube tip is 4.2 cm above the carina. Nasogastric tube tip and side port are below the diaphragm. No pneumothorax. There is atelectatic change in the left mid lung. Lungs elsewhere are clear. Heart is mildly enlarged with pulmonary vascularity normal. No adenopathy. There is aortic atherosclerosis. No bone lesions. IMPRESSION: Tube positions as described without pneumothorax. Atelectatic change left mid lung. No edema or airspace opacity. Stable cardiac prominence. Aortic Atherosclerosis (ICD10-I70.0). Electronically Signed   By: Bretta Bang III M.D.   On: 08/10/2019 05:02     Assessment/Plan:   85 y.o. female admitted 7/16 with severe angioedema. Pt found to have A-fib and is on Cardizem gtt for rate control. Pt is intubated off sedation and has not woken up.    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  08/11/2019, 10:36 AM

## 2019-08-11 NOTE — Progress Notes (Signed)
eeg completed ° °

## 2019-08-11 NOTE — Progress Notes (Signed)
CRITICAL CARE NOTE  BRIEF PATIENT DESCRIPTION Severe ACUTE Hypoxic and Hypercapnic Respiratory Failurefrom severe angioedemaandsevere airway obstruction, with severe metabolic encephalopathy  SIGNIFICANT EVENTS 7/16 admitted to ICU for severe angioedema 7/17 severe angioedema, resp failure 7/18 severe angioedema, resp failure 7/19 severe andioedema, resp failure 7/20 severe angioedema, resp failure 08/03/19- Daughter [name] is at bedside, we discussed hospital course and care plan together. Questions answered. 08/04/19- patient is s/p FFP with significant improvement of severely swollen lips/tongue. 08/05/19 - patient has small cuff leak this am with overall improvement in a lingual and orofacial swelling however still has significant angioedema. 08/06/19-patient had additional improvement in angioedema. She had episode of AFrVR while family was present and they did say this happens to her occasionally, this was remedied with 2.5 lopressor x1 and po maintance metop 12.2bid. 7/26 cardiology consulted for afib, remains encephlopathic 7/27 remains encephalopathic 7/29 remains encephalopathic; MRI NEGATIVE for any acute intracranial abnormality 7/30: Consult Neurology for persistent Encephalopathy    CC  follow up respiratory failure & Encephalopathy  SUBJECTIVE Remains Encephalopathic On Cardizem infusion Remains critically ill   BP (!) 111/52 (BP Location: Left Arm)    Pulse 80    Temp 99.3 F (37.4 C) (Oral)    Resp 16    Ht 5' 2.01" (1.575 m)    Wt 83.8 kg    LMP  (LMP Unknown)    SpO2 96%    BMI 33.78 kg/m    I/O last 3 completed shifts: In: 2516.5 [I.V.:648.1; NG/GT:1768.3; IV Piggyback:100] Out: 3301 [Urine:2850; Emesis/NG output:450; Stool:1] Total I/O In: 98.2 [I.V.:98.2] Out: 225 [Urine:225]  SpO2: 96 % FiO2 (%): 21 %  Estimated body mass index is 33.78 kg/m as calculated from the following:   Height as of this encounter: 5' 2.01" (1.575 m).   Weight as of  this encounter: 83.8 kg.    REVIEW OF SYSTEMS PATIENT IS UNABLE TO PROVIDE COMPLETE REVIEW OF SYSTEMS DUE TO SEVERE CRITICAL ILLNESS, INTUBATION      PHYSICAL EXAMINATION: GENERAL: Critically ill-appearing female, laying in bed, intubated, obtunded on no sedation HEAD: Atraumatic, normocephalic EYES: Pupils PERRLA, no scleral icterus  MOUTH: Moist mucous membranes, ET tube in place NECK: Supple PULMONARY: Clear to auscultation throughout, no wheezing, vent assisted, even CARDIOVASCULAR: Irregularly irregular rhythm, no murmurs, rubs, gallops GASTROINTESTINAL: Soft, nontender, nondistended, no guarding or rebound tenderness, bowel sounds positive x4 MUSCULOSKELETAL: No deformities, no edema   NEUROLOGIC: Obtunded, will intermittently open eyes to painful stimuli, doesn't follow commands SKIN: Warm and dry.  No obvious rashes, lesions, ulcerations    CULTURE RESULTS   No results found for this or any previous visit (from the past 240 hour(s)).        IMAGING    MR BRAIN WO CONTRAST  Result Date: 08/10/2019 CLINICAL DATA:  85 year old female with severe acute respiratory failure. Encephalopathy. EXAM: MRI HEAD WITHOUT CONTRAST TECHNIQUE: Multiplanar, multiecho pulse sequences of the brain and surrounding structures were obtained without intravenous contrast. COMPARISON:  None. FINDINGS: Brain: No restricted diffusion to suggest acute infarction. No midline shift, mass effect, evidence of mass lesion, ventriculomegaly, extra-axial collection or acute intracranial hemorrhage. Cervicomedullary junction and pituitary are within normal limits. Cerebral volume is within normal limits for age. Pearline Cables and white matter signal is largely normal for age throughout the brain. Probable right temporal lobe coronal fissure cyst, although with evidence of mild adjacent gliosis on FLAIR. No cortical encephalomalacia or chronic cerebral blood products. Vascular: Major intracranial vascular flow voids  are preserved. Skull and  upper cervical spine: Normal for age visible cervical spine. Visualized bone marrow signal is within normal limits. Sinuses/Orbits: Negative orbits. Inspissated secretions in the left maxillary and right sphenoid sinuses. No other sinus fluid levels. Other: Intubated. Small volume of fluid in the visible pharynx. Associated trace mastoid fluid. Grossly normal visible internal auditory structures. IMPRESSION: 1. No acute intracranial abnormality. 2. Largely normal for age noncontrast MRI appearance of the brain. Electronically Signed   By: Genevie Ann M.D.   On: 08/10/2019 15:07   DG Chest Port 1 View  Result Date: 08/10/2019 CLINICAL DATA:  Hypoxia EXAM: PORTABLE CHEST 1 VIEW COMPARISON:  August 08, 2019 FINDINGS: Endotracheal tube tip is 4.2 cm above the carina. Nasogastric tube tip and side port are below the diaphragm. No pneumothorax. There is atelectatic change in the left mid lung. Lungs elsewhere are clear. Heart is mildly enlarged with pulmonary vascularity normal. No adenopathy. There is aortic atherosclerosis. No bone lesions. IMPRESSION: Tube positions as described without pneumothorax. Atelectatic change left mid lung. No edema or airspace opacity. Stable cardiac prominence. Aortic Atherosclerosis (ICD10-I70.0). Electronically Signed   By: Lowella Grip III M.D.   On: 08/10/2019 05:02     Nutrition Status: Nutrition Problem: Inadequate oral intake Etiology: inability to eat (pt sedated and ventilated) Signs/Symptoms: NPO status Interventions: Tube feeding   BMP Latest Ref Rng & Units 08/10/2019 08/09/2019 08/08/2019  Glucose 70 - 99 mg/dL 214(H) 257(H) 233(H)  BUN 8 - 23 mg/dL 98(H) 88(H) 98(H)  Creatinine 0.44 - 1.00 mg/dL 1.18(H) 1.17(H) 1.30(H)  BUN/Creat Ratio 6 - 22 (calc) - - -  Sodium 135 - 145 mmol/L 147(H) 145 143  Potassium 3.5 - 5.1 mmol/L 4.7 5.0 4.6  Chloride 98 - 111 mmol/L 113(H) 110 108  CO2 22 - 32 mmol/L 22 22 24   Calcium 8.9 - 10.3 mg/dL 9.0  9.0 8.9     Indwelling Urinary Catheter continued, requirement due to   Reason to continue Indwelling Urinary Catheter strict Intake/Output monitoring for hemodynamic instability         Ventilator continued, requirement due to severe respiratory failure   Ventilator Sedation RASS 0 to -2        ASSESSMENT AND PLAN   Severe ACUTE Hypoxic and Hypercapnic Respiratory Failure in the setting of severe angioedema and severe airway obstruction -Full vent support -Wean FiO2 and PEEP as tolerated to maintain O2 saturations greater than 92% -VAP protocol -Spontaneous breathing trials when respiratory parameters met and mental status permits -Assess cuff leak daily -Continue H2 blocker (completed course of steroids and Benadryl)   ACUTE KIDNEY INJURY/Renal Failure Mild Hypernatremia -Monitor I&O's / urinary output -Follow BMP -Ensure adequate renal perfusion -Avoid nephrotoxic agents as able -Replace electrolytes as indicated -Free water flushes    Acute toxic metabolic encephalopathy -Provide supportive care -Avoid sedating medications as able -Daily wake-up assessment -MRI brain 08/02/2019 NEGATIVE for any acute intracranial abnormality. -Will consult Neurology   Atrial Fibrillation w/ RVR Chronic Diastolic CHF without acute exacerbation Hypertension Hx: CAD, HTN -Continuous cardiac monitor -Maintain MAP greater than 65 -Cardiology following, appreciate input -Continue Cardizem drip per cardiology recommendations -Cardiology recommends deferring chronic anticoagulation to outpatient cardiologist -Continue Hydralazine & HCTZ   Hyperglycemia -CBGs -Sliding scale insulin -Follow ICU hypo/hyperglycemia protocol           Pt is critically ill, prognosis is guarded.  She is at high risk for cardiac arrest and death.    BEST PRACTICES DISPOSITION: ICU GOALS OF CARE: Full Code VTE PROPHYLAXIS:  SQ Lovenox STRESS ULCER PROPHYLAXIS: IV Pepcid CONSULTS:  Cardiology, Neurology UPDATES: No family at bedside during NP rounds 08/11/19

## 2019-08-11 NOTE — Plan of Care (Signed)
Pt is still on full vent support. 21/5/20/500 pt will open eyes with turns and oral suction but do not follow any command. Pt withdraws to deep pain stimulations but dose not localized pain. Pt is tolerating Tf well. Adequate urine output the last 24hrs..  Pt daughter was updated at bedside last night.  Pt is not progressing toward goal.

## 2019-08-11 NOTE — Progress Notes (Signed)
Pt checked cuff leak. Pt does have cuff leak today.

## 2019-08-11 NOTE — Progress Notes (Signed)
[facility] Cardiology  SUBJECTIVE: Patient still intubated   Vitals:   08/11/19 0300 08/11/19 0400 08/11/19 0500 08/11/19 0600  BP: (!) 148/69 (!) 145/77 (!) 149/76 (!) 140/65  Pulse: 48 69 83 79  Resp: 17 18 17 16   Temp:  99 F (37.2 C)    TempSrc:      SpO2: 96% 97% 97% 96%  Weight:      Height:         Intake/Output Summary (Last 24 hours) at 08/11/2019 0743 Last data filed at 08/11/2019 0600 Gross per 24 hour  Intake 1904.2 ml  Output 2125 ml  Net -220.8 ml      PHYSICAL EXAM  General: Well developed, well nourished, in no acute distress HEENT:  Normocephalic and atramatic Neck:  No JVD.  Lungs: Clear bilaterally to auscultation and percussion. Heart: HRRR . Normal S1 and S2 without gallops or murmurs.  Abdomen: Bowel sounds are positive, abdomen soft and non-tender  Msk:  Back normal, normal gait. Normal strength and tone for age. Extremities: No clubbing, cyanosis or edema.   Neuro: Alert and oriented X 3. Psych:  Good affect, responds appropriately   LABS: Basic Metabolic Panel: Recent Labs    08/10/19 0353 08/11/19 0422  NA 147* 146*  K 4.7 4.3  CL 113* 115*  CO2 22 22  GLUCOSE 214* 171*  BUN 98* 97*  CREATININE 1.18* 1.19*  1.26*  CALCIUM 9.0 8.9  MG 3.6* 3.6*  PHOS 3.4 4.0   Liver Function Tests: Recent Labs    08/10/19 0353 08/11/19 0422  ALBUMIN 2.7* 2.6*   No results for input(s): LIPASE, AMYLASE in the last 72 hours. CBC: Recent Labs    08/10/19 0353 08/11/19 0422  WBC 26.2* 23.7*  NEUTROABS 22.5* 19.2*  HGB 14.4 13.9  HCT 43.5 44.1  MCV 88.4 91.5  PLT 218 220   Cardiac Enzymes: No results for input(s): CKTOTAL, CKMB, CKMBINDEX, TROPONINI in the last 72 hours. BNP: Invalid input(s): POCBNP D-Dimer: No results for input(s): DDIMER in the last 72 hours. Hemoglobin A1C: Recent Labs    08/09/19 0256  HGBA1C 5.9*   Fasting Lipid Panel: Recent Labs    08/11/19 0422  TRIG 153*   Thyroid Function Tests: No results for  input(s): TSH, T4TOTAL, T3FREE, THYROIDAB in the last 72 hours.  Invalid input(s): FREET3 Anemia Panel: No results for input(s): VITAMINB12, FOLATE, FERRITIN, TIBC, IRON, RETICCTPCT in the last 72 hours.  MR BRAIN WO CONTRAST  Result Date: 08/10/2019 CLINICAL DATA:  85 year old female with severe acute respiratory failure. Encephalopathy. EXAM: MRI HEAD WITHOUT CONTRAST TECHNIQUE: Multiplanar, multiecho pulse sequences of the brain and surrounding structures were obtained without intravenous contrast. COMPARISON:  None. FINDINGS: Brain: No restricted diffusion to suggest acute infarction. No midline shift, mass effect, evidence of mass lesion, ventriculomegaly, extra-axial collection or acute intracranial hemorrhage. Cervicomedullary junction and pituitary are within normal limits. Cerebral volume is within normal limits for age. 97 and white matter signal is largely normal for age throughout the brain. Probable right temporal lobe coronal fissure cyst, although with evidence of mild adjacent gliosis on FLAIR. No cortical encephalomalacia or chronic cerebral blood products. Vascular: Major intracranial vascular flow voids are preserved. Skull and upper cervical spine: Normal for age visible cervical spine. Visualized bone marrow signal is within normal limits. Sinuses/Orbits: Negative orbits. Inspissated secretions in the left maxillary and right sphenoid sinuses. No other sinus fluid levels. Other: Intubated. Small volume of fluid in the visible pharynx. Associated trace mastoid fluid. Grossly  normal visible internal auditory structures. IMPRESSION: 1. No acute intracranial abnormality. 2. Largely normal for age noncontrast MRI appearance of the brain. Electronically Signed   By: Odessa Fleming M.D.   On: 08/10/2019 15:07   DG Chest Port 1 View  Result Date: 08/10/2019 CLINICAL DATA:  Hypoxia EXAM: PORTABLE CHEST 1 VIEW COMPARISON:  August 08, 2019 FINDINGS: Endotracheal tube tip is 4.2 cm above the carina.  Nasogastric tube tip and side port are below the diaphragm. No pneumothorax. There is atelectatic change in the left mid lung. Lungs elsewhere are clear. Heart is mildly enlarged with pulmonary vascularity normal. No adenopathy. There is aortic atherosclerosis. No bone lesions. IMPRESSION: Tube positions as described without pneumothorax. Atelectatic change left mid lung. No edema or airspace opacity. Stable cardiac prominence. Aortic Atherosclerosis (ICD10-I70.0). Electronically Signed   By: Bretta Bang III M.D.   On: 08/10/2019 05:02     Echo   TELEMETRY: Atrial fibrillation 80 to 90 bpm:  ASSESSMENT AND PLAN:  Active Problems:   Angioedema    1.  Atrial fibrillation with RVR, likely due to rebound tachycardia, after holding sotalol and carvedilol, not on chronic anticoagulation per outpatient cardiologist.  Patient initially started on amiodarone drip, which was discontinued due to history of amiodarone induced thyroiditis, started on Cardizem drip, rate controlled on 15 mg/h. 2.  Angioedema with respiratory failure, remains intubated. 3.  Encephalopathy 4.  Coronary artery disease, history of non-ST elevation myocardial infarction 5.  Chronic diastolic congestive heart failure, stable 6.  Essential hypertension 7.  Palliative care, been consulted  Recommendations  1.  Continue current medications 2.  Continue Cardizem drip for now, while intubated 3.  Resume carvedilol 12.5 mg twice daily and sotalol 40 mg twice daily when patient extubated and able to take oral medications 4.  Defer chronic anticoagulation to outpatient cardiologist (Dr. Juliann Pares) 5.  No further diagnostics at this time   Marcina Millard, MD, PhD, [facility] 08/11/2019 7:43 AM

## 2019-08-12 ENCOUNTER — Inpatient Hospital Stay: Payer: Medicare Other

## 2019-08-12 DIAGNOSIS — R400 Somnolence: Secondary | ICD-10-CM

## 2019-08-12 LAB — CBC WITH DIFFERENTIAL/PLATELET
Abs Immature Granulocytes: 0.26 10*3/uL — ABNORMAL HIGH (ref 0.00–0.07)
Basophils Absolute: 0 10*3/uL (ref 0.0–0.1)
Basophils Relative: 0 %
Eosinophils Absolute: 0 10*3/uL (ref 0.0–0.5)
Eosinophils Relative: 0 %
HCT: 40.4 % (ref 36.0–46.0)
Hemoglobin: 13.4 g/dL (ref 12.0–15.0)
Immature Granulocytes: 2 %
Lymphocytes Relative: 11 %
Lymphs Abs: 2 10*3/uL (ref 0.7–4.0)
MCH: 29.5 pg (ref 26.0–34.0)
MCHC: 33.2 g/dL (ref 30.0–36.0)
MCV: 88.8 fL (ref 80.0–100.0)
Monocytes Absolute: 2 10*3/uL — ABNORMAL HIGH (ref 0.1–1.0)
Monocytes Relative: 11 %
Neutro Abs: 13.3 10*3/uL — ABNORMAL HIGH (ref 1.7–7.7)
Neutrophils Relative %: 76 %
Platelets: 165 10*3/uL (ref 150–400)
RBC: 4.55 MIL/uL (ref 3.87–5.11)
RDW: 14.5 % (ref 11.5–15.5)
WBC: 17.6 10*3/uL — ABNORMAL HIGH (ref 4.0–10.5)
nRBC: 0 % (ref 0.0–0.2)

## 2019-08-12 LAB — GLUCOSE, CAPILLARY
Glucose-Capillary: 105 mg/dL — ABNORMAL HIGH (ref 70–99)
Glucose-Capillary: 113 mg/dL — ABNORMAL HIGH (ref 70–99)
Glucose-Capillary: 128 mg/dL — ABNORMAL HIGH (ref 70–99)
Glucose-Capillary: 138 mg/dL — ABNORMAL HIGH (ref 70–99)
Glucose-Capillary: 158 mg/dL — ABNORMAL HIGH (ref 70–99)
Glucose-Capillary: 158 mg/dL — ABNORMAL HIGH (ref 70–99)

## 2019-08-12 LAB — T3, FREE: T3, Free: 1.2 pg/mL — ABNORMAL LOW (ref 2.0–4.4)

## 2019-08-12 LAB — C3 COMPLEMENT: C3 Complement: 148 mg/dL (ref 82–167)

## 2019-08-12 LAB — RENAL FUNCTION PANEL
Albumin: 2.4 g/dL — ABNORMAL LOW (ref 3.5–5.0)
Anion gap: 14 (ref 5–15)
BUN: 78 mg/dL — ABNORMAL HIGH (ref 8–23)
CO2: 19 mmol/L — ABNORMAL LOW (ref 22–32)
Calcium: 8.4 mg/dL — ABNORMAL LOW (ref 8.9–10.3)
Chloride: 107 mmol/L (ref 98–111)
Creatinine, Ser: 0.96 mg/dL (ref 0.44–1.00)
GFR calc Af Amer: >60 mL/min (ref >60)
GFR calc non Af Amer: 55 mL/min — ABNORMAL LOW (ref >60)
Glucose, Bld: 146 mg/dL — ABNORMAL HIGH (ref 70–99)
Phosphorus: 3.3 mg/dL (ref 2.5–4.6)
Potassium: 3.5 mmol/L (ref 3.5–5.1)
Sodium: 140 mmol/L (ref 135–145)

## 2019-08-12 LAB — CREATININE CLEARANCE, URINE, 24 HOUR
Collection Interval-CRCL: 24 hours
Creatinine Clearance: 62 mL/min — ABNORMAL LOW (ref 75–115)
Creatinine, 24H Ur: 860 mg/d (ref 600–1800)
Creatinine, Urine: 43 mg/dL
Urine Total Volume-CRCL: 2000 mL

## 2019-08-12 LAB — C4 COMPLEMENT: Complement C4, Body Fluid: 37 mg/dL (ref 12–38)

## 2019-08-12 LAB — TRIGLYCERIDES: Triglycerides: 86 mg/dL (ref <150)

## 2019-08-12 LAB — MAGNESIUM: Magnesium: 2.9 mg/dL — ABNORMAL HIGH (ref 1.7–2.4)

## 2019-08-12 LAB — T3 UPTAKE: T3 Uptake Ratio: 34 % (ref 24–39)

## 2019-08-12 MED ORDER — PROPRANOLOL HCL 1 MG/ML IV SOLN
1.0000 mg | Freq: Once | INTRAVENOUS | Status: AC
  Administered 2019-08-12: 1 mg via INTRAVENOUS
  Filled 2019-08-12: qty 1

## 2019-08-12 MED ORDER — PROPRANOLOL HCL 20 MG PO TABS
20.0000 mg | ORAL_TABLET | Freq: Three times a day (TID) | ORAL | Status: DC
  Administered 2019-08-12 – 2019-08-13 (×6): 20 mg via ORAL
  Filled 2019-08-12 (×6): qty 1

## 2019-08-12 MED ORDER — MODAFINIL 100 MG PO TABS
100.0000 mg | ORAL_TABLET | Freq: Two times a day (BID) | ORAL | Status: DC
  Administered 2019-08-12: 100 mg via ORAL
  Filled 2019-08-12: qty 1

## 2019-08-12 NOTE — Progress Notes (Addendum)
Patient continues to remain on the vent. No sedation infusing this shift. Patient responds to pain, opens eyes but will not follow commands. Tube feeds infusing per order. Foley draining clear yellow urine, 24 hour urine collection starte at 1900 on 08/11/2010. Cardizem drip discontinued per order. Pt HR slightly elevated this morning, otherwise VSS.

## 2019-08-12 NOTE — Progress Notes (Signed)
Patient found in liquid stool. Multiple loose stools reported from day shift RN. NP notified and order for flexiseal obtained.

## 2019-08-12 NOTE — Progress Notes (Signed)
Pt resting in bed quietly. All sedation has remained off throughout shift. Pt grimaces to pain and stimulation. Pt opened eyes when turning today and coughs with suctioning. Pt has not done anything purposeful throughout shift. Neurology seen pt today but no new orders received. Daughter visited this morning and was updated by Dr. Jayme Cloud. Propranolol was increased this morning due to increased HR and one time IV dose given. Vital signs have been stable.

## 2019-08-12 NOTE — Progress Notes (Addendum)
Subjective: No further changes ovenight. No improvement on exam   Past Medical History:  Diagnosis Date  . CHF (congestive heart failure) (HCC)   . Coronary artery disease   . Dementia (HCC)   . Heart attack (HCC)   . Hypertension   . hyperthyroid   . Hyperthyroidism   . MI (myocardial infarction) [facility])     Past Surgical History:  Procedure Laterality Date  . CARDIAC CATHETERIZATION N/A 08/14/2014   Procedure: Left Heart Cath and Coronary Angiography;  Surgeon: Marcina Millard, MD;  Location: [facility] INVASIVE CV LAB;  Service: Cardiovascular;  Laterality: N/A;  . CHOLECYSTECTOMY    . PARTIAL HYSTERECTOMY      Family History  Problem Relation Age of Onset  . Heart attack Sister   . Hypertension Sister   . Dementia Sister   . Heart attack Son   . Dementia Sister     Social History:  reports that she has never smoked. She has never used smokeless tobacco. She reports current alcohol use of about 2.0 standard drinks of alcohol per week. She reports that she does not use drugs.  Allergies  Allergen Reactions  . Lisinopril Swelling    Medications: I have reviewed the patient's current medications.  ROS: Unable to obtain as not following commands  Physical Examination: Blood pressure (!) 146/71, pulse 87, temperature 98.6 F (37 C), temperature source Axillary, resp. rate 18, height 5' 2.01" (1.575 m), weight 86 kg, SpO2 98 %.  Pt doesn't follow commands Grimace to pain Dysconjugate gaze Doesn't track me through the room.   Laboratory Studies:   Basic Metabolic Panel: Recent Labs  Lab 08/08/19 0415 08/08/19 0415 08/09/19 0256 08/09/19 0256 08/10/19 0353 08/11/19 0422 08/12/19 0413  NA 143  --  145  --  147* 146* 140  K 4.6  --  5.0  --  4.7 4.3 3.5  CL 108  --  110  --  113* 115* 107  CO2 24  --  22  --  22 22 19*  GLUCOSE 233*  --  257*  --  214* 171* 146*  BUN 98*  --  88*  --  98* 97* 78*  CREATININE 1.30*  --  1.17*  --  1.18* 1.19*  1.26* 0.96   CALCIUM 8.9   < > 9.0   < > 9.0 8.9 8.4*  MG 3.3*  --  3.5*  --  3.6* 3.6* 2.9*  PHOS 3.8  --  3.3  --  3.4 4.0 3.3   < > = values in this interval not displayed.    Liver Function Tests: Recent Labs  Lab 08/08/19 0415 08/09/19 0256 08/10/19 0353 08/11/19 0422 08/12/19 0413  ALBUMIN 2.6* 2.7* 2.7* 2.6* 2.4*   No results for input(s): LIPASE, AMYLASE in the last 168 hours. Recent Labs  Lab 08/11/19 1025  AMMONIA 29    CBC: Recent Labs  Lab 08/08/19 0415 08/09/19 0256 08/10/19 0353 08/11/19 0422 08/12/19 0413  WBC 22.1* 26.9* 26.2* 23.7* 17.6*  NEUTROABS 16.4* 21.9* 22.5* 19.2* 13.3*  HGB 14.4 14.9 14.4 13.9 13.4  HCT 44.0 44.1 43.5 44.1 40.4  MCV 88.7 87.3 88.4 91.5 88.8  PLT 208 211 218 220 165    Cardiac Enzymes: No results for input(s): CKTOTAL, CKMB, CKMBINDEX, TROPONINI in the last 168 hours.  BNP: Invalid input(s): POCBNP  CBG: Recent Labs  Lab 08/11/19 1923 08/11/19 2336 08/12/19 0323 08/12/19 0741 08/12/19 1116  GLUCAP 122* 136* 158* 113* 138*    Microbiology:  Results for orders placed or performed during the hospital encounter of 07/28/19  SARS Coronavirus 2 by RT PCR (hospital order, performed in [facility] hospital lab) Nasopharyngeal Nasopharyngeal Swab     Status: None   Collection Time: 07/28/19  3:43 PM   Specimen: Nasopharyngeal Swab  Result Value Ref Range Status   SARS Coronavirus 2 NEGATIVE NEGATIVE Final    Comment: (NOTE) SARS-CoV-2 target nucleic acids are NOT DETECTED.  The SARS-CoV-2 RNA is generally detectable in upper and lower respiratory specimens during the acute phase of infection. The lowest concentration of SARS-CoV-2 viral copies this assay can detect is 250 copies / mL. A negative result does not preclude SARS-CoV-2 infection and should not be used as the sole basis for treatment or other patient management decisions.  A negative result may occur with improper specimen collection / handling, submission of  specimen other than nasopharyngeal swab, presence of viral mutation(s) within the areas targeted by this assay, and inadequate number of viral copies (<250 copies / mL). A negative result must be combined with clinical observations, patient history, and epidemiological information.  Fact Sheet for Patients:   BoilerBrush.com.cy  Fact Sheet for Healthcare Providers: https://pope.com/  This test is not yet approved or  cleared by the [place] FDA and has been authorized for detection and/or diagnosis of SARS-CoV-2 by FDA under an Emergency Use Authorization (EUA).  This EUA will remain in effect (meaning this test can be used) for the duration of the COVID-19 declaration under Section 564(b)(1) of the Act, 21 U.S.C. section 360bbb-3(b)(1), unless the authorization is terminated or revoked sooner.  Performed at [facility], [address] [place], [place] [address]   MRSA PCR Screening     Status: None   Collection Time: 07/28/19  5:46 PM   Specimen: Nasal Mucosa; Nasopharyngeal  Result Value Ref Range Status   MRSA by PCR NEGATIVE NEGATIVE Final    Comment:        The GeneXpert MRSA Assay (FDA approved for NASAL specimens only), is one component of a comprehensive MRSA colonization surveillance program. It is not intended to diagnose MRSA infection nor to guide or monitor treatment for MRSA infections. Performed at [facility], [address] [place], [place] [address]     Coagulation Studies: No results for input(s): LABPROT, INR in the last 72 hours.  Urinalysis: No results for input(s): COLORURINE, LABSPEC, PHURINE, GLUCOSEU, HGBUR, BILIRUBINUR, KETONESUR, PROTEINUR, UROBILINOGEN, NITRITE, LEUKOCYTESUR in the last 168 hours.  Invalid input(s): APPERANCEUR  Lipid Panel:     Component Value Date/Time   CHOL 277 (H) 04/16/2017 1112   CHOL 155 02/08/2014 0350   TRIG 86 08/12/2019 0413    TRIG 69 02/08/2014 0350   HDL 83 04/16/2017 1112   HDL 52 02/08/2014 0350   CHOLHDL 3.3 04/16/2017 1112   VLDL 14 02/08/2014 0350   LDLCALC 174 (H) 04/16/2017 1112   LDLCALC 89 02/08/2014 0350    HgbA1C:  Lab Results  Component Value Date   HGBA1C 5.9 (H) 08/09/2019    Urine Drug Screen:  No results found for: LABOPIA, COCAINSCRNUR, LABBENZ, AMPHETMU, THCU, LABBARB  Alcohol Level: No results for input(s): ETH in the last 168 hours.  Other results: A-fib with RVR  Imaging: EEG  Result Date: 08/11/2019 Charlsie Quest, MD     08/11/2019  3:43 PM Patient Name: Melanie Lewis MRN: [id number] Epilepsy Attending: Charlsie Quest Referring Physician/Provider: Dr Sarina Ser Date: 08/11/2019 Duration: 24.42 mins Patient history:  85 y.o. female admitted 7/16 with severe angioedema. Pt found to have A-fib and is on Cardizem gtt for rate control. Pt is intubated off sedation and has not woken up.  EEG to evaluate for seizure Level of alertness:  Comatose AEDs during EEG study: None Technical aspects: This EEG study was done with scalp electrodes positioned according to the 10-20 International system of electrode placement. Electrical activity was acquired at a sampling rate of 500Hz  and reviewed with a high frequency filter of 70Hz  and a low frequency filter of 1Hz . EEG data were recorded continuously and digitally stored. Description: EEG showed continuous generalized 3 to 6 Hz theta-delta slowing. EEG ws reactive to noxious stimuli. Hyperventilation and photic stimulation were not performed.   ABNORMALITY -Continuous slow, generalized IMPRESSION: This study is suggestive of severe diffuse encephalopathy, nonspecific etiology. No seizures or epileptiform discharges were seen throughout the recording. Charlsie Questriyanka O Yadav   MR BRAIN WO CONTRAST  Result Date: 08/10/2019 CLINICAL DATA:  85 year old female with severe acute respiratory failure. Encephalopathy. EXAM: MRI HEAD WITHOUT CONTRAST  TECHNIQUE: Multiplanar, multiecho pulse sequences of the brain and surrounding structures were obtained without intravenous contrast. COMPARISON:  None. FINDINGS: Brain: No restricted diffusion to suggest acute infarction. No midline shift, mass effect, evidence of mass lesion, ventriculomegaly, extra-axial collection or acute intracranial hemorrhage. Cervicomedullary junction and pituitary are within normal limits. Cerebral volume is within normal limits for age. Wallace CullensGray and white matter signal is largely normal for age throughout the brain. Probable right temporal lobe coronal fissure cyst, although with evidence of mild adjacent gliosis on FLAIR. No cortical encephalomalacia or chronic cerebral blood products. Vascular: Major intracranial vascular flow voids are preserved. Skull and upper cervical spine: Normal for age visible cervical spine. Visualized bone marrow signal is within normal limits. Sinuses/Orbits: Negative orbits. Inspissated secretions in the left maxillary and right sphenoid sinuses. No other sinus fluid levels. Other: Intubated. Small volume of fluid in the visible pharynx. Associated trace mastoid fluid. Grossly normal visible internal auditory structures. IMPRESSION: 1. No acute intracranial abnormality. 2. Largely normal for age noncontrast MRI appearance of the brain. Electronically Signed   By: Odessa FlemingH  Hall M.D.   On: 08/10/2019 15:07   US RENAL  Result Date: 08/12/2019 CLINICAL DATA:  Initial evaluation for acute renal failure. EXAM: RENAL / URINARY TRACT ULTRASOUND COMPLETE COMPARISON:  Prior radiograph from 07/28/2019 FINDINGS: Right Kidney: Renal measurements: 8.8 x 4.9 x 4.5 cm = volume: 100.2 mL. Renal echogenicity within normal limits. No nephrolithiasis or hydronephrosis. 1.8 x 1.7 x 1.8 cm simple cyst present at the interpolar region. Left Kidney: Renal measurements: 9.0 x 5.4 x 5.0 cm = volume: 125.9 mL. Renal echogenicity within normal limits. No nephrolithiasis or hydronephrosis. No  focal renal mass. Bladder: Decompressed with a Foley catheter in place and not well visualized. Other: None. IMPRESSION: 1. No hydronephrosis or other acute abnormality. 2. 1.8 cm simple cyst at the interpolar right kidney. Electronically Signed   By: Rise MuBenjamin  McClintock M.D.   On: 08/12/2019 08:18     Assessment/Plan:   85 y.o. female admitted 7/16 with severe angioedema. Pt found to have A-fib and is on Cardizem gtt for rate control. Pt is intubated off sedation and has not woken up.    - Unclear etiology of her encephalopathy and not waking up - MRI done 7/29, not acute abnormalities of her brain  - EEG with diffused slowing and encephalopath - Provigil restart when ok from primary team - Likely a component of critical care  neuropathy/myopathy - s/p discussion with daughters at bedside yesterday 08/12/2019, 12:53 PM

## 2019-08-12 NOTE — Progress Notes (Signed)
Melanie Lewis  MRN: [id number]  DOB/AGE: Sep 18, 1934 85 y.o.  Primary Care Physician:Fitzgerald, Cheral Marker, MD  Admit date: 07/28/2019  Chief Complaint:  Chief Complaint  Patient presents with   Angioedema    S-Pt presented on  07/28/2019 with  Chief Complaint  Patient presents with   Angioedema  .    Pt is intubated, unable to offer any complaints   Meds  chlorhexidine gluconate (MEDLINE KIT)  15 mL Mouth Rinse BID   Chlorhexidine Gluconate Cloth  6 each Topical Daily   docusate  100 mg Per Tube BID   enoxaparin (LOVENOX) injection  40 mg Subcutaneous Q24H   free water  200 mL Per Tube Q4H   hydrALAZINE  25 mg Per Tube Q8H   insulin aspart  0-20 Units Subcutaneous Q4H   mouth rinse  15 mL Mouth Rinse 10 times per day   multivitamin with minerals  1 tablet Per Tube Daily   pantoprazole sodium  40 mg Per Tube Daily   propranolol  20 mg Oral TID   propylthiouracil  100 mg Per Tube Q8H   Followed by   [START ON 08/14/2019] propylthiouracil  50 mg Per Tube Q8H   sodium chloride flush  3 mL Intravenous Q12H   thiamine  100 mg Per Tube Daily         WUJ:[id number] to get any data   Physical Exam: Vital signs in last 24 hours: Temp:  [98.5 F (36.9 C)-99.5 F (37.5 C)] 98.6 F (37 C) (07/31 1200) Pulse Rate:  [44-160] 87 (07/31 1200) Resp:  [14-22] 18 (07/31 1200) BP: (114-168)/(58-87) 146/71 (07/31 1200) SpO2:  [95 %-98 %] 98 % (07/31 1200) FiO2 (%):  [21 %] 21 % (07/31 1200) Weight:  [86 kg] 86 kg (07/31 0100) Weight change:  Last BM Date: 08/09/19  Intake/Output from previous day: 07/30 0701 - 07/31 0700 In: 2598.6 [I.V.:1348.6; NG/GT:1250] Out: 1175 [Urine:1175] Total I/O In: 366.9 [I.V.:366.9] Out: 450 [Urine:450]   Physical Exam: General- pt is unresponsive, intubated, critically ill-appearing Resp- ET tube in situCTA B/L Rhonchi present CVS- S1S2 irregular in rate and rhythm GIT- BS+, soft, NT, ND EXT- trace LE Edema, no  Cyanosis   Lab Results: CBC Recent Labs    08/11/19 0422 08/12/19 0413  WBC 23.7* 17.6*  HGB 13.9 13.4  HCT 44.1 40.4  PLT 220 165    BMET Recent Labs    08/11/19 0422 08/12/19 0413  NA 146* 140  K 4.3 3.5  CL 115* 107  CO2 22 19*  GLUCOSE 171* 146*  BUN 97* 78*  CREATININE 1.19*   1.26* 0.96  CALCIUM 8.9 8.4*   Creatinine trend  2021 1.0--1.6= => 0.9 2019 1.3 2016 1.0--1.6   MICRO No results found for this or any previous visit (from the past 240 hour(s)).    Lab Results  Component Value Date   CALCIUM 8.4 (L) 08/12/2019   PHOS 3.3 08/12/2019     Albumin 2.4 Corrected calcium history 8.4+1.4 result: 9.8      Renal u.s done in  08/12/19 Right Kidney:  Renal measurements: 8.8 x 4.9 x 4.5 cm = volume: 100.2 mL. Renal echogenicity within normal limits. No nephrolithiasis or hydronephrosis. 1.8 x 1.7 x 1.8 cm simple cyst present at the interpolar region.  Left Kidney:  Renal measurements: 9.0 x 5.4 x 5.0 cm = volume: 125.9 mL. Renal echogenicity within normal limits. No nephrolithiasis or hydronephrosis. No focal renal mass.  Bladder:  Decompressed with a Foley  catheter in place and not well visualized.  Other:  None.  IMPRESSION: 1. No hydronephrosis or other acute abnormality. 2. 1.8 cm simple cyst at the interpolar right kidney.   Impression: Patient is a 85 year old African American female with a past medical history of atrial fibrillation, congestive heart failure, coronary disease, dementia, hypertension, hypothyroidism who was admitted on July 28, 2019 with chief complaint of angioedema -[T78.3XXA]   1)Renal  AKI secondary to ATN/prerenal AKI on CKD CKD stage III Patient has CKD since 2016 Patient has CKD secondary to hypertension/age associated decline as patient is 85 years old. Ultrasound reviewed  Patient AKI is now better Creatinine trending down   2)HTN Blood pressure stable   3)Anemia none  4)  Encephalopathy Unclear etiology Neurology and hospitalist team following  5) acute respiratory failure Patient remains intubated  6) electrolytes   sodium Hypernatremia Now better   potassium Normokalemic    7)Acid base Co2 at goal  8) angioedema Critical care following   Plan:  Creat better Agree with holding HCTZ Will continue current IVF      [name] [name] 08/12/2019, 12:33 PM

## 2019-08-12 NOTE — Progress Notes (Signed)
[facility] Encounter Note  Patient: Melanie Lewis / Admit Date: 07/28/2019 / Date of Encounter: 08/12/2019, 7:25 AM   Subjective: No apparent significant condition change today with patient fully intubated and comatose.  Neurology suggesting diffuse decrease in brainwave activity without evidence of seizure.  Patient with continued atrial fibrillation with rapid ventricular rate multifactorial in nature including discontinuation of previous medication management for atrial fibrillation.  Primary goal from the cardiovascular standpoint at this.  Is for heart rate control until further decision making including palliative care and/or etc.  Review of Systems: Cannot assess  Objective: Telemetry: Atrial fibrillation with rapid ventricular rate Physical Exam: Blood pressure (!) 153/81, pulse 104, temperature 98.5 F (36.9 C), resp. rate 18, height 5' 2.01" (1.575 m), weight 86 kg, SpO2 98 %. Body mass index is 34.67 kg/m. General intubated sedated Head: Normocephalic, atraumatic, sclera non-icteric, no xanthomas, nares are without discharge. Neck: No apparent masses Lungs: On respirator with diffuse wheezes, few rhonchi, no rales , no crackles   Heart irregular rate and rhythm, normal S1 S2, no murmur, no rub, no gallop, PMI is normal size and placement, carotid upstroke normal without bruit, jugular venous pressure normal Abdomen: Soft,  with normoactive bowel sounds. No hepatosplenomegaly. Abdominal aorta is normal size without bruit Extremities: Trace edema, no clubbing, no cyanosis, no ulcers,  Peripheral: 2+ radial, 2+ femoral, 2+ dorsal pedal pulses Neuro: Not alert and oriented.  Does not moves all extremities spontaneously. Psych: Does not responds to questions appropriately with a normal affect.   Intake/Output Summary (Last 24 hours) at 08/12/2019 0725 Last data filed at 08/12/2019 0277 Gross per 24 hour  Intake 2341.39 ml  Output 1175 ml  Net 1166.39  ml    Inpatient Medications:  . chlorhexidine gluconate (MEDLINE KIT)  15 mL Mouth Rinse BID  . Chlorhexidine Gluconate Cloth  6 each Topical Daily  . docusate  100 mg Per Tube BID  . enoxaparin (LOVENOX) injection  40 mg Subcutaneous Q24H  . free water  200 mL Per Tube Q4H  . hydrALAZINE  25 mg Per Tube Q8H  . insulin aspart  0-20 Units Subcutaneous Q4H  . mouth rinse  15 mL Mouth Rinse 10 times per day  . multivitamin with minerals  1 tablet Per Tube Daily  . pantoprazole sodium  40 mg Per Tube Daily  . propranolol  10 mg Oral TID  . propylthiouracil  100 mg Per Tube Q8H   Followed by  . [START ON 08/14/2019] propylthiouracil  50 mg Per Tube Q8H  . sodium chloride flush  3 mL Intravenous Q12H  . thiamine  100 mg Per Tube Daily   Infusions:  . sodium chloride 75 mL/hr at 08/12/19 0621  . sodium chloride 250 mL (08/10/19 1547)  . sodium chloride Stopped (08/11/19 1601)  . feeding supplement (VITAL HIGH PROTEIN) 50 mL/hr at 08/12/19 0300  . fentaNYL infusion INTRAVENOUS Stopped (08/07/19 0719)  . propofol (DIPRIVAN) infusion Stopped (08/07/19 4128)    Labs: Recent Labs    08/11/19 0422 08/12/19 0413  NA 146* 140  K 4.3 3.5  CL 115* 107  CO2 22 19*  GLUCOSE 171* 146*  BUN 97* 78*  CREATININE 1.19*  1.26* 0.96  CALCIUM 8.9 8.4*  MG 3.6* 2.9*  PHOS 4.0 3.3   Recent Labs    08/11/19 0422 08/12/19 0413  ALBUMIN 2.6* 2.4*   Recent Labs    08/11/19 0422 08/12/19 0413  WBC 23.7* 17.6*  NEUTROABS 19.2* 13.3*  HGB 13.9 13.4  HCT 44.1 40.4  MCV 91.5 88.8  PLT 220 165   No results for input(s): CKTOTAL, CKMB, TROPONINI in the last 72 hours. Invalid input(s): POCBNP No results for input(s): HGBA1C in the last 72 hours.   Weights: Filed Weights   08/09/19 0436 08/10/19 0500 08/12/19 0100  Weight: 84.4 kg 83.8 kg 86 kg     Radiology/Studies:  EEG  Result Date: 08/11/2019 Lora Havens, MD     08/11/2019  3:43 PM Patient Name: Melanie Lewis MRN:  [id number] Epilepsy Attending: Lora Havens Referring Physician/Provider: Dr Vernard Gambles Date: 08/11/2019 Duration: 24.42 mins Patient history:  85 y.o. female admitted 7/16 with severe angioedema. Pt found to have A-fib and is on Cardizem gtt for rate control. Pt is intubated off sedation and has not woken up.  EEG to evaluate for seizure Level of alertness:  Comatose AEDs during EEG study: None Technical aspects: This EEG study was done with scalp electrodes positioned according to the 10-20 International system of electrode placement. Electrical activity was acquired at a sampling rate of _0  and reviewed with a high frequency filter of _1  and a low frequency filter of _2 . EEG data were recorded continuously and digitally stored. Description: EEG showed continuous generalized 3 to 6 Hz theta-delta slowing. EEG ws reactive to noxious stimuli. Hyperventilation and photic stimulation were not performed.   ABNORMALITY -Continuous slow, generalized IMPRESSION: This study is suggestive of severe diffuse encephalopathy, nonspecific etiology. No seizures or epileptiform discharges were seen throughout the recording. Lora Havens   MR BRAIN WO CONTRAST  Result Date: 08/10/2019 CLINICAL DATA:  85 year old female with severe acute respiratory failure. Encephalopathy. EXAM: MRI HEAD WITHOUT CONTRAST TECHNIQUE: Multiplanar, multiecho pulse sequences of the brain and surrounding structures were obtained without intravenous contrast. COMPARISON:  None. FINDINGS: Brain: No restricted diffusion to suggest acute infarction. No midline shift, mass effect, evidence of mass lesion, ventriculomegaly, extra-axial collection or acute intracranial hemorrhage. Cervicomedullary junction and pituitary are within normal limits. Cerebral volume is within normal limits for age. Pearline Cables and white matter signal is largely normal for age throughout the brain. Probable right temporal lobe coronal fissure cyst, although with  evidence of mild adjacent gliosis on FLAIR. No cortical encephalomalacia or chronic cerebral blood products. Vascular: Major intracranial vascular flow voids are preserved. Skull and upper cervical spine: Normal for age visible cervical spine. Visualized bone marrow signal is within normal limits. Sinuses/Orbits: Negative orbits. Inspissated secretions in the left maxillary and right sphenoid sinuses. No other sinus fluid levels. Other: Intubated. Small volume of fluid in the visible pharynx. Associated trace mastoid fluid. Grossly normal visible internal auditory structures. IMPRESSION: 1. No acute intracranial abnormality. 2. Largely normal for age noncontrast MRI appearance of the brain. Electronically Signed   By: Genevie Ann M.D.   On: 08/10/2019 15:07   DG Chest Port 1 View  Result Date: 08/10/2019 CLINICAL DATA:  Hypoxia EXAM: PORTABLE CHEST 1 VIEW COMPARISON:  August 08, 2019 FINDINGS: Endotracheal tube tip is 4.2 cm above the carina. Nasogastric tube tip and side port are below the diaphragm. No pneumothorax. There is atelectatic change in the left mid lung. Lungs elsewhere are clear. Heart is mildly enlarged with pulmonary vascularity normal. No adenopathy. There is aortic atherosclerosis. No bone lesions. IMPRESSION: Tube positions as described without pneumothorax. Atelectatic change left mid lung. No edema or airspace opacity. Stable cardiac prominence. Aortic Atherosclerosis (ICD10-I70.0). Electronically Signed   By: Lowella Grip III M.D.   On:  08/10/2019 05:02   DG Chest Port 1 View  Result Date: 08/08/2019 CLINICAL DATA:  Hypoxia EXAM: PORTABLE CHEST 1 VIEW COMPARISON:  August 05, 2019. FINDINGS: Endotracheal tube tip is 3.4 cm above the carina. Nasogastric tube tip and side port are in the stomach. No pneumothorax. Lungs are clear. Heart is mildly enlarged with pulmonary vascularity normal. No adenopathy. There is aortic atherosclerosis. No bone lesions. IMPRESSION: Tube positions as described  without pneumothorax. Lungs clear. Stable cardiac prominence. Aortic Atherosclerosis (ICD10-I70.0). Electronically Signed   By: Lowella Grip III M.D.   On: 08/08/2019 07:18   DG Chest Port 1 View  Result Date: 08/05/2019 CLINICAL DATA:  Intubated. EXAM: PORTABLE CHEST 1 VIEW COMPARISON:  07/30/2019 FINDINGS: Patient slightly rotated to the right. Endotracheal tube has tip 3.2 cm above the carina. Enteric tube courses into the stomach and off the film as tip is not visualized. Mild hazy prominence of the right perihilar/infrahilar vessels which may be due to mild vascular congestion. Minimal stable left base opacification which may be due to small effusion with atelectasis. Stable borderline cardiomegaly. Remainder of the exam is unchanged. IMPRESSION: 1. Stable borderline cardiomegaly with suggestion of minimal vascular congestion. Mild stable left base opacification which may be due to small amount of pleural fluid/atelectasis. 2.  Tubes and lines as described. Electronically Signed   By: Marin Olp M.D.   On: 08/05/2019 10:45   DG Chest Port 1 View  Result Date: 07/30/2019 CLINICAL DATA:  Acute respiratory failure EXAM: PORTABLE CHEST 1 VIEW COMPARISON:  Radiograph 07/29/2019 FINDINGS: Endotracheal tube tip terminates 3.1 cm from the carina. Transesophageal tube tip terminates below the level of imaging, beyond the GE junction. Telemetry leads overlie the chest. Some mildly coarsened interstitial changes are quite similar to prior accounting for some increasing atelectatic change. No new focal consolidation, pneumothorax or effusion. The aorta is calcified. Borderline cardiomegaly is similar to prior. The remaining cardiomediastinal contours are unremarkable. No acute osseous or soft tissue abnormality. IMPRESSION: 1. Lines and tubes as above. 2. Stable mildly coarsened interstitial changes. 3. Some increasing atelectatic change in the bases. Electronically Signed   By: Lovena Le M.D.   On:  07/30/2019 15:09   DG Chest Port 1 View  Result Date: 07/29/2019 CLINICAL DATA:  85 year old female with respiratory failure EXAM: PORTABLE CHEST 1 VIEW COMPARISON:  07/28/2019 FINDINGS: Cardiomediastinal silhouette unchanged in size and contour. Endotracheal tube terminates 3.8 cm above the carina. Gastric tube terminates in the upper abdomen with the side port persisting at the GE junction. No pneumothorax. Coarsened interstitial markings bilaterally, unchanged. No new confluent airspace disease. IMPRESSION: Similar appearance of the chest x-rays with bilateral mild interstitial opacities. Unchanged endotracheal tube. Unchanged gastric tube with the side port terminating near the GE junction. Electronically Signed   By: Corrie Mckusick D.O.   On: 07/29/2019 09:13   DG Chest Portable 1 View  Result Date: 07/28/2019 CLINICAL DATA:  Cyst post intubation.  Respiratory failure. EXAM: PORTABLE CHEST 1 VIEW COMPARISON:  08/13/2014 FINDINGS: Endotracheal tube is now present, 5.3 cm above the carina. Nasogastric tube extends into the upper abdomen just beyond the gastroesophageal junction with its proximal side hole positioned at the gastroesophageal junction. The lungs are symmetrically expanded. No pneumothorax or pleural effusion. Trace interstitial pulmonary edema has developed, likely cardiogenic in nature. Mild cardiomegaly is present new since prior examination. IMPRESSION: Endotracheal tube in appropriate position. Nasogastric tube chest within the gastric lumen. Advancement of 5-10 cm may more optimally position the catheter.  Mild cardiogenic failure.  New mild cardiomegaly. Electronically Signed   By: Fidela Salisbury MD   On: 07/28/2019 15:45   DG Abd Portable 1 View  Result Date: 07/28/2019 CLINICAL DATA:  Post intubation EXAM: PORTABLE ABDOMEN - 1 VIEW COMPARISON:  None. FINDINGS: The bowel gas pattern is normal. Tip the NG tube is seen within the proximal stomach. No acute osseous. IMPRESSION: Tip the  NG tube in the proximal stomach. Electronically Signed   By: Prudencio Pair M.D.   On: 07/28/2019 15:57     Assessment and Recommendation  85 y.o. female with previous history of paroxysmal nonvalvular atrial fibrillation hypertension hyperlipidemia coronary atherosclerosis diastolic dysfunction congestive heart failure having acute respiratory failure with poor prognosis 1.  Heart rate control with beta-blocker per NG tube and/or calcium channel blocker diltiazem drip as necessary for goal heart rate below 110 bpm.  No use of amiodarone or sotalol due to side effects of previous 2.  No anticoagulation at this time for atrial fibrillation although treatment for deep venous thrombosis would be appropriate 3.  No further cardiac diagnostics necessary at this time 4.  We will follow for above and if necessary will make other changes although with currently assuming and awaiting for possible palliative care discussion Signed, Serafina Royals M.D. FACC

## 2019-08-12 NOTE — Progress Notes (Addendum)
CRITICAL CARE NOTE  BRIEF PATIENT DESCRIPTION Severe ACUTE Hypoxic and Hypercapnic Respiratory Failurefrom severe angioedemaandsevere airway obstruction, with severe metabolic encephalopathy  SIGNIFICANT EVENTS 7/16 admitted to ICU for severe angioedema 7/17 severe angioedema, resp failure 7/18 severe angioedema, resp failure 7/19 severe andioedema, resp failure 7/20 severe angioedema, resp failure 08/03/19- Daughter Melanie Lewis is at bedside, we discussed hospital course and care plan together. Questions answered. 08/04/19- patient is s/p FFP with significant improvement of severely swollen lips/tongue. 08/05/19 - patient has small cuff leak this am with overall improvement in a lingual and orofacial swelling however still has significant angioedema. 08/06/19-patient had additional improvement in angioedema. She had episode of AFrVR while family was present and they did say this happens to her occasionally, this was remedied with 2.5 lopressor x1 and po maintance metop 12.2bid. 7/26 cardiology consulted for afib, remains encephlopathic 7/27 remains encephalopathic 7/29 remains encephalopathic; MRI NEGATIVE for any acute intracranial abnormality 7/30: Consult Neurology for persistent Encephalopathy   CC  follow up respiratory failure & Encephalopathy  SUBJECTIVE Remains Encephalopathic off Cardizem infusion Remains critically ill   BP (!) 146/71 (BP Location: Left Arm)    Pulse 87    Temp 98.6 F (37 C) (Axillary)    Resp 18    Ht 5' 2.01" (1.575 m)    Wt 86 kg    LMP  (LMP Unknown)    SpO2 98%    BMI 34.67 kg/m    I/O last 3 completed shifts: In: 3404.5 [I.V.:1604.5; NG/GT:1800] Out: 2050 [Urine:2050] Total I/O In: 366.9 [I.V.:366.9] Out: 450 [Urine:450]  SpO2: 98 % FiO2 (%): 21 %  Estimated body mass index is 34.67 kg/m as calculated from the following:   Height as of this encounter: 5' 2.01" (1.575 m).   Weight as of this encounter: 86 kg.   REVIEW OF  SYSTEMS PATIENT IS UNABLE TO PROVIDE COMPLETE REVIEW OF SYSTEMS DUE TO SEVERE CRITICAL ILLNESS, INTUBATION     PHYSICAL EXAMINATION: GENERAL: Critically ill-appearing female, laying in bed, intubated, obtunded on no sedation HEAD: Atraumatic, normocephalic EYES: Pupils PERRLA, no scleral icterus  MOUTH: Moist mucous membranes, ET tube in place NECK: Supple PULMONARY: Clear to auscultation throughout, no wheezing, vent assisted, even CARDIOVASCULAR: Irregularly irregular rhythm, no murmurs, rubs, gallops GASTROINTESTINAL: Soft, nontender, nondistended, no guarding or rebound tenderness, bowel sounds positive x4 MUSCULOSKELETAL: No deformities, no edema   NEUROLOGIC: Obtunded, will intermittently open eyes to painful stimuli, doesn't follow commands SKIN: Warm and dry.  No obvious rashes, lesions, ulcerations   CULTURE RESULTS   No results found for this or any previous visit (from the past 240 hour(s)).    IMAGING    EEG  Result Date: 08/11/2019 Lora Havens, MD     08/11/2019  3:43 PM Patient Name: Melanie Lewis MRN: [id number] Epilepsy Attending: Lora Havens Referring Physician/Provider: Dr Vernard Gambles Date: 08/11/2019 Duration: 24.42 mins Patient history:  85 y.o. female admitted 7/16 with severe angioedema. Pt found to have A-fib and is on Cardizem gtt for rate control. Pt is intubated off sedation and has not woken up.  EEG to evaluate for seizure Level of alertness:  Comatose AEDs during EEG study: None Technical aspects: This EEG study was done with scalp electrodes positioned according to the 10-20 International system of electrode placement. Electrical activity was acquired at a sampling rate of _0  and reviewed with a high frequency filter of _1  and a low frequency filter of _2 . EEG data were recorded continuously and digitally stored. Description: EEG  showed continuous generalized 3 to 6 Hz theta-delta slowing. EEG ws reactive to noxious stimuli.  Hyperventilation and photic stimulation were not performed.   ABNORMALITY -Continuous slow, generalized IMPRESSION: This study is suggestive of severe diffuse encephalopathy, nonspecific etiology. No seizures or epileptiform discharges were seen throughout the recording. Priyanka O Yadav   US RENAL  Result Date: 08/12/2019 CLINICAL DATA:  Initial evaluation for acute renal failure. EXAM: RENAL / URINARY TRACT ULTRASOUND COMPLETE COMPARISON:  Prior radiograph from 07/28/2019 FINDINGS: Right Kidney: Renal measurements: 8.8 x 4.9 x 4.5 cm = volume: 100.2 mL. Renal echogenicity within normal limits. No nephrolithiasis or hydronephrosis. 1.8 x 1.7 x 1.8 cm simple cyst present at the interpolar region. Left Kidney: Renal measurements: 9.0 x 5.4 x 5.0 cm = volume: 125.9 mL. Renal echogenicity within normal limits. No nephrolithiasis or hydronephrosis. No focal renal mass. Bladder: Decompressed with a Foley catheter in place and not well visualized. Other: None. IMPRESSION: 1. No hydronephrosis or other acute abnormality. 2. 1.8 cm simple cyst at the interpolar right kidney. Electronically Signed   By: Jeannine Boga M.D.   On: 08/12/2019 08:18    Nutrition Status: Nutrition Problem: Inadequate oral intake Etiology: inability to eat (pt sedated and ventilated) Signs/Symptoms: NPO status Interventions: Tube feeding   BMP Latest Ref Rng & Units 08/12/2019 08/11/2019 08/11/2019  Glucose 70 - 99 mg/dL 146(H) 171(H) -  BUN 8 - 23 mg/dL 78(H) 97(H) -  Creatinine 0.44 - 1.00 mg/dL 0.96 1.19(H) 1.26(H)  BUN/Creat Ratio 6 - 22 (calc) - - -  Sodium 135 - 145 mmol/L 140 146(H) -  Potassium 3.5 - 5.1 mmol/L 3.5 4.3 -  Chloride 98 - 111 mmol/L 107 115(H) -  CO2 22 - 32 mmol/L 19(L) 22 -  Calcium 8.9 - 10.3 mg/dL 8.4(L) 8.9 -   Scheduled Meds:  chlorhexidine gluconate (MEDLINE KIT)  15 mL Mouth Rinse BID   Chlorhexidine Gluconate Cloth  6 each Topical Daily   docusate  100 mg Per Tube BID   enoxaparin  (LOVENOX) injection  40 mg Subcutaneous Q24H   free water  200 mL Per Tube Q4H   hydrALAZINE  25 mg Per Tube Q8H   insulin aspart  0-20 Units Subcutaneous Q4H   mouth rinse  15 mL Mouth Rinse 10 times per day   modafinil  100 mg Oral BID   multivitamin with minerals  1 tablet Per Tube Daily   pantoprazole sodium  40 mg Per Tube Daily   propranolol  20 mg Oral TID   propylthiouracil  100 mg Per Tube Q8H   Followed by   [START ON 08/14/2019] propylthiouracil  50 mg Per Tube Q8H   sodium chloride flush  3 mL Intravenous Q12H   thiamine  100 mg Per Tube Daily   Continuous Infusions:  sodium chloride 75 mL/hr at 08/12/19 1154   sodium chloride 250 mL (08/10/19 1547)   sodium chloride Stopped (08/11/19 1601)   feeding supplement (VITAL HIGH PROTEIN) 1,000 mL (08/12/19 1239)   fentaNYL infusion INTRAVENOUS Stopped (08/07/19 0719)   propofol (DIPRIVAN) infusion Stopped (08/07/19 2094)   PRN Meds:.sodium chloride, acetaminophen, fentaNYL, hydrALAZINE, LORazepam, ondansetron (ZOFRAN) IV, polyethylene glycol, sodium chloride flush   Indwelling Urinary Catheter continued, requirement due to   Reason to continue Indwelling Urinary Catheter strict Intake/Output monitoring for hemodynamic instability         Ventilator continued, requirement due to severe respiratory failure   Ventilator Sedation RASS 0 to -2     ASSESSMENT  AND PLAN   Severe ACUTE Hypoxic and Hypercapnic Respiratory Failure in the setting of severe angioedema and severe airway obstruction -Full vent support -Wean FiO2 and PEEP as tolerated to maintain O2 saturations greater than 92% -VAP protocol -Spontaneous breathing trials when respiratory parameters met and mental status permits -Assess cuff leak daily -Continue H2 blocker (completed course of steroids and Benadryl)   ACUTE KIDNEY INJURY/Renal Failure - Likely due to excessive Diuretics now improving Mild Hypernatremia -Monitor I&O's / urinary  output -Follow BMP -Ensure adequate renal perfusion -Avoid nephrotoxic agents as able -Replace electrolytes as indicated -Free water flushes -24 hour Urine collection -Nephrology input appreciated   Acute toxic metabolic encephalopathy -Provide supportive care -Avoid sedating medications as able -Daily wake-up assessment -MRI brain 08/02/2019 NEGATIVE for any acute intracranial abnormality. -EEG with severe diffuse encephalopathy, nonspecific etiology. 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 HR improved with propanolol -Cardiology recommends deferring chronic anticoagulation to outpatient cardiologist  Hyperglycemia -CBGs -Sliding scale insulin -Follow ICU hypo/hyperglycemia protocol      Pt is critically ill, prognosis is guarded.  She is at high risk for cardiac arrest and death.    BEST PRACTICES DISPOSITION: ICU GOALS OF CARE: Full Code VTE PROPHYLAXIS: SQ Lovenox STRESS ULCER PROPHYLAXIS: IV Pepcid CONSULTS: Cardiology, Neurology UPDATES: No family at bedside during NP rounds 08/11/19

## 2019-08-13 ENCOUNTER — Inpatient Hospital Stay: Payer: Medicare Other

## 2019-08-13 ENCOUNTER — Encounter: Payer: Self-pay | Admitting: Internal Medicine

## 2019-08-13 DIAGNOSIS — G92 Toxic encephalopathy: Secondary | ICD-10-CM | POA: Diagnosis not present

## 2019-08-13 DIAGNOSIS — J9601 Acute respiratory failure with hypoxia: Secondary | ICD-10-CM | POA: Diagnosis not present

## 2019-08-13 LAB — MAGNESIUM: Magnesium: 2.6 mg/dL — ABNORMAL HIGH (ref 1.7–2.4)

## 2019-08-13 LAB — CBC WITH DIFFERENTIAL/PLATELET
Abs Immature Granulocytes: 0.22 10*3/uL — ABNORMAL HIGH (ref 0.00–0.07)
Basophils Absolute: 0 10*3/uL (ref 0.0–0.1)
Basophils Relative: 0 %
Eosinophils Absolute: 0 10*3/uL (ref 0.0–0.5)
Eosinophils Relative: 0 %
HCT: 38.9 % (ref 36.0–46.0)
Hemoglobin: 12.4 g/dL (ref 12.0–15.0)
Immature Granulocytes: 1 %
Lymphocytes Relative: 10 %
Lymphs Abs: 1.8 10*3/uL (ref 0.7–4.0)
MCH: 28.8 pg (ref 26.0–34.0)
MCHC: 31.9 g/dL (ref 30.0–36.0)
MCV: 90.5 fL (ref 80.0–100.0)
Monocytes Absolute: 2.3 10*3/uL — ABNORMAL HIGH (ref 0.1–1.0)
Monocytes Relative: 13 %
Neutro Abs: 13.8 10*3/uL — ABNORMAL HIGH (ref 1.7–7.7)
Neutrophils Relative %: 76 %
Platelets: 147 10*3/uL — ABNORMAL LOW (ref 150–400)
RBC: 4.3 MIL/uL (ref 3.87–5.11)
RDW: 14 % (ref 11.5–15.5)
WBC: 18.2 10*3/uL — ABNORMAL HIGH (ref 4.0–10.5)
nRBC: 0 % (ref 0.0–0.2)

## 2019-08-13 LAB — KAPPA/LAMBDA LIGHT CHAINS
Kappa free light chain: 29.1 mg/L — ABNORMAL HIGH (ref 3.3–19.4)
Kappa, lambda light chain ratio: 1.32 (ref 0.26–1.65)
Lambda free light chains: 22.1 mg/L (ref 5.7–26.3)

## 2019-08-13 LAB — ANA W/REFLEX IF POSITIVE: Anti Nuclear Antibody (ANA): NEGATIVE

## 2019-08-13 LAB — RENAL FUNCTION PANEL
Albumin: 2.1 g/dL — ABNORMAL LOW (ref 3.5–5.0)
Anion gap: 7 (ref 5–15)
BUN: 59 mg/dL — ABNORMAL HIGH (ref 8–23)
CO2: 22 mmol/L (ref 22–32)
Calcium: 8.1 mg/dL — ABNORMAL LOW (ref 8.9–10.3)
Chloride: 109 mmol/L (ref 98–111)
Creatinine, Ser: 0.76 mg/dL (ref 0.44–1.00)
GFR calc Af Amer: >60 mL/min (ref >60)
GFR calc non Af Amer: >60 mL/min (ref >60)
Glucose, Bld: 172 mg/dL — ABNORMAL HIGH (ref 70–99)
Phosphorus: 3 mg/dL (ref 2.5–4.6)
Potassium: 3.1 mmol/L — ABNORMAL LOW (ref 3.5–5.1)
Sodium: 138 mmol/L (ref 135–145)

## 2019-08-13 LAB — GLUCOSE, CAPILLARY
Glucose-Capillary: 128 mg/dL — ABNORMAL HIGH (ref 70–99)
Glucose-Capillary: 142 mg/dL — ABNORMAL HIGH (ref 70–99)
Glucose-Capillary: 156 mg/dL — ABNORMAL HIGH (ref 70–99)
Glucose-Capillary: 125 mg/dL — ABNORMAL HIGH (ref 70–99)
Glucose-Capillary: 128 mg/dL — ABNORMAL HIGH (ref 70–99)

## 2019-08-13 LAB — TRIGLYCERIDES: Triglycerides: 71 mg/dL (ref <150)

## 2019-08-13 MED ORDER — SODIUM CHLORIDE 0.9 % IV SOLN
500.0000 mg | INTRAVENOUS | Status: AC
  Administered 2019-08-13 – 2019-08-15 (×3): 500 mg via INTRAVENOUS
  Filled 2019-08-13 (×3): qty 4

## 2019-08-13 MED ORDER — POTASSIUM CHLORIDE 10 MEQ/100ML IV SOLN
10.0000 meq | INTRAVENOUS | Status: AC
  Administered 2019-08-13 (×6): 10 meq via INTRAVENOUS
  Filled 2019-08-13 (×6): qty 100

## 2019-08-13 NOTE — Progress Notes (Signed)
ADER FRITZE  MRN: [id number]  DOB/AGE: 1934-03-29 85 y.o.  Primary Care Physician:Fitzgerald, Cheral Marker, MD  Admit date: 07/28/2019  Chief Complaint:  Chief Complaint  Patient presents with  . Angioedema    S-Pt presented on  07/28/2019 with  Chief Complaint  Patient presents with  . Angioedema  .    Pt is intubated, unable to offer any complaints   Meds . chlorhexidine gluconate (MEDLINE KIT)  15 mL Mouth Rinse BID  . Chlorhexidine Gluconate Cloth  6 each Topical Daily  . docusate  100 mg Per Tube BID  . enoxaparin (LOVENOX) injection  40 mg Subcutaneous Q24H  . free water  200 mL Per Tube Q4H  . hydrALAZINE  25 mg Per Tube Q8H  . insulin aspart  0-20 Units Subcutaneous Q4H  . mouth rinse  15 mL Mouth Rinse 10 times per day  . multivitamin with minerals  1 tablet Per Tube Daily  . pantoprazole sodium  40 mg Per Tube Daily  . propranolol  20 mg Oral TID  . propylthiouracil  100 mg Per Tube Q8H   Followed by  . [START ON 08/14/2019] propylthiouracil  50 mg Per Tube Q8H  . sodium chloride flush  3 mL Intravenous Q12H  . thiamine  100 mg Per Tube Daily         MWN:[id number] to get any data   Physical Exam: Vital signs in last 24 hours: Temp:  [97.4 F (36.3 C)-98.7 F (37.1 C)] 98.7 F (37.1 C) (08/01 0800) Pulse Rate:  [52-153] 102 (08/01 1005) Resp:  [17-22] 21 (08/01 0800) BP: (119-161)/(55-108) 146/68 (08/01 1005) SpO2:  [97 %-99 %] 98 % (08/01 0800) FiO2 (%):  [21 %] 21 % (08/01 1116) Weight:  [86.2 kg] 86.2 kg (08/01 0258) Weight change: 0.2 kg Last BM Date: 08/13/19  Intake/Output from previous day: 07/31 0701 - 08/01 0700 In: 2875.6 [I.V.:1725.6; NG/GT:1150] Out: 1625 [Urine:1625] Total I/O In: 6 [I.V.:6] Out: 150 [Urine:150]   Physical Exam: General- pt is unresponsive, intubated, critically ill-appearing Resp- ET tube in situ Rhonchi present CVS- S1S2 irregular in rate and rhythm GIT- BS+, soft, NT, ND EXT- trace LE Edema, no  Cyanosis   Lab Results: CBC Recent Labs    08/12/19 0413 08/13/19 0440  WBC 17.6* 18.2*  HGB 13.4 12.4  HCT 40.4 38.9  PLT 165 147*    BMET Recent Labs    08/12/19 0413 08/13/19 0440  NA 140 138  K 3.5 3.1*  CL 107 109  CO2 19* 22  GLUCOSE 146* 172*  BUN 78* 59*  CREATININE 0.96 0.76  CALCIUM 8.4* 8.1*   Creatinine trend  2021 1.0--1.6= => 0.9==>0.7 2019 1.3 2016 1.0--1.6   MICRO No results found for this or any previous visit (from the past 240 hour(s)).    Lab Results  Component Value Date   CALCIUM 8.1 (L) 08/13/2019   PHOS 3.0 08/13/2019     Albumin 2.4 Corrected calcium history 8.4+1.4 result: 9.8      Renal u.s done in  08/12/19 Right Kidney:  Renal measurements: 8.8 x 4.9 x 4.5 cm = volume: 100.2 mL. Renal echogenicity within normal limits. No nephrolithiasis or hydronephrosis. 1.8 x 1.7 x 1.8 cm simple cyst present at the interpolar region.  Left Kidney:  Renal measurements: 9.0 x 5.4 x 5.0 cm = volume: 125.9 mL. Renal echogenicity within normal limits. No nephrolithiasis or hydronephrosis. No focal renal mass.  Bladder:  Decompressed with a Foley catheter in place  and not well visualized.  Other:  None.  IMPRESSION: 1. No hydronephrosis or other acute abnormality. 2. 1.8 cm simple cyst at the interpolar right kidney.   Impression: Patient is a 85 year old African American female with a past medical history of atrial fibrillation, congestive heart failure, coronary disease, dementia, hypertension, hypothyroidism who was admitted on July 28, 2019 with chief complaint of angioedema -[T78.3XXA]   1)Renal  AKI secondary to ATN/prerenal AKI on CKD CKD stage IIIa Patient has CKD since 2016 Patient has CKD secondary to hypertension/age associated decline as patient is 85 years old. Ultrasound reviewed  Patient AKI is now better Creatinine trending down   2)HTN Blood pressure stable   3)Anemia none  4)  Encephalopathy Unclear etiology Neurology and hospitalist team following  5) acute respiratory failure Patient remains intubated  6) electrolytes   sodium Hypernatremia Now better   potassium Hypokalemic Being repleted   7)Acid base Co2 at goal  8) angioedema Critical care following   Plan:   Will continue current IVF      Jamesrobert Ohanesian s Dajane Valli 08/13/2019, 11:23 AM

## 2019-08-13 NOTE — Progress Notes (Signed)
[facility] Encounter Note  Patient: DEMIAH GULLICKSON / Admit Date: 07/28/2019 / Date of Encounter: 08/13/2019, 6:22 AM   Subjective: No apparent significant condition change today with patient fully intubated and comatose.  Neurology suggesting diffuse decrease in brainwave activity without evidence of seizure.  No apparent current etiology of encephalopathy.  Patient with continued atrial fibrillation with rapid ventricular rate multifactorial in nature including discontinuation of previous medication management for atrial fibrillation.  Primary goal from the cardiovascular standpoint at this time is for heart rate control until further decision making including palliative care and/or etc.  Review of Systems: Cannot assess  Objective: Telemetry: Atrial fibrillation with rapid ventricular rate Physical Exam: Blood pressure (!) 161/72, pulse 56, temperature 98.5 F (36.9 C), temperature source Axillary, resp. rate 21, height 5' 2.01" (1.575 m), weight 86.2 kg, SpO2 99 %. Body mass index is 34.75 kg/m. General intubated sedated Head: Normocephalic, atraumatic, sclera non-icteric, no xanthomas, nares are without discharge. Neck: No apparent masses Lungs: On respirator with diffuse wheezes, few rhonchi, no rales , no crackles   Heart irregular rate and rhythm, normal S1 S2, no murmur, no rub, no gallop, PMI is normal size and placement, carotid upstroke normal without bruit, jugular venous pressure normal Abdomen: Soft,  with normoactive bowel sounds. No hepatosplenomegaly. Abdominal aorta is normal size without bruit Extremities: Trace edema, no clubbing, no cyanosis, no ulcers,  Peripheral: 2+ radial, 2+ femoral, 2+ dorsal pedal pulses Neuro: Not alert and oriented.  Does not moves all extremities spontaneously. Psych: Does not responds to questions appropriately with a normal affect.   Intake/Output Summary (Last 24 hours) at 08/13/2019 0622 Last data filed at  08/13/2019 0600 Gross per 24 hour  Intake 3132.8 ml  Output 1625 ml  Net 1507.8 ml    Inpatient Medications:  . chlorhexidine gluconate (MEDLINE KIT)  15 mL Mouth Rinse BID  . Chlorhexidine Gluconate Cloth  6 each Topical Daily  . docusate  100 mg Per Tube BID  . enoxaparin (LOVENOX) injection  40 mg Subcutaneous Q24H  . free water  200 mL Per Tube Q4H  . hydrALAZINE  25 mg Per Tube Q8H  . insulin aspart  0-20 Units Subcutaneous Q4H  . mouth rinse  15 mL Mouth Rinse 10 times per day  . multivitamin with minerals  1 tablet Per Tube Daily  . pantoprazole sodium  40 mg Per Tube Daily  . propranolol  20 mg Oral TID  . propylthiouracil  100 mg Per Tube Q8H   Followed by  . [START ON 08/14/2019] propylthiouracil  50 mg Per Tube Q8H  . sodium chloride flush  3 mL Intravenous Q12H  . thiamine  100 mg Per Tube Daily   Infusions:  . sodium chloride 75 mL/hr at 08/13/19 0600  . sodium chloride 250 mL (08/10/19 1547)  . sodium chloride Stopped (08/11/19 1601)  . feeding supplement (VITAL HIGH PROTEIN) 50 mL/hr at 08/13/19 0600  . fentaNYL infusion INTRAVENOUS Stopped (08/07/19 0719)  . potassium chloride 10 mEq (08/13/19 0600)  . propofol (DIPRIVAN) infusion Stopped (08/07/19 4098)    Labs: Recent Labs    08/12/19 0413 08/13/19 0440  NA 140 138  K 3.5 3.1*  CL 107 109  CO2 19* 22  GLUCOSE 146* 172*  BUN 78* 59*  CREATININE 0.96 0.76  CALCIUM 8.4* 8.1*  MG 2.9* 2.6*  PHOS 3.3 3.0   Recent Labs    08/12/19 0413 08/13/19 0440  ALBUMIN 2.4* 2.1*   Recent  Labs    08/12/19 0413 08/13/19 0440  WBC 17.6* 18.2*  NEUTROABS 13.3* 13.8*  HGB 13.4 12.4  HCT 40.4 38.9  MCV 88.8 90.5  PLT 165 147*   No results for input(s): CKTOTAL, CKMB, TROPONINI in the last 72 hours. Invalid input(s): POCBNP No results for input(s): HGBA1C in the last 72 hours.   Weights: Filed Weights   08/10/19 0500 08/12/19 0100 08/13/19 0258  Weight: 83.8 kg 86 kg 86.2 kg     Radiology/Studies:   EEG  Result Date: 08/11/2019 Lora Havens, MD     08/11/2019  3:43 PM Patient Name: IVER FEHRENBACH MRN: [id number] Epilepsy Attending: Lora Havens Referring Physician/Provider: Dr Vernard Gambles Date: 08/11/2019 Duration: 24.42 mins Patient history:  85 y.o. female admitted 7/16 with severe angioedema. Pt found to have A-fib and is on Cardizem gtt for rate control. Pt is intubated off sedation and has not woken up.  EEG to evaluate for seizure Level of alertness:  Comatose AEDs during EEG study: None Technical aspects: This EEG study was done with scalp electrodes positioned according to the 10-20 International system of electrode placement. Electrical activity was acquired at a sampling rate of _0  and reviewed with a high frequency filter of _1  and a low frequency filter of _2 . EEG data were recorded continuously and digitally stored. Description: EEG showed continuous generalized 3 to 6 Hz theta-delta slowing. EEG ws reactive to noxious stimuli. Hyperventilation and photic stimulation were not performed.   ABNORMALITY -Continuous slow, generalized IMPRESSION: This study is suggestive of severe diffuse encephalopathy, nonspecific etiology. No seizures or epileptiform discharges were seen throughout the recording. Lora Havens   MR BRAIN WO CONTRAST  Result Date: 08/10/2019 CLINICAL DATA:  85 year old female with severe acute respiratory failure. Encephalopathy. EXAM: MRI HEAD WITHOUT CONTRAST TECHNIQUE: Multiplanar, multiecho pulse sequences of the brain and surrounding structures were obtained without intravenous contrast. COMPARISON:  None. FINDINGS: Brain: No restricted diffusion to suggest acute infarction. No midline shift, mass effect, evidence of mass lesion, ventriculomegaly, extra-axial collection or acute intracranial hemorrhage. Cervicomedullary junction and pituitary are within normal limits. Cerebral volume is within normal limits for age. Pearline Cables and white matter signal is  largely normal for age throughout the brain. Probable right temporal lobe coronal fissure cyst, although with evidence of mild adjacent gliosis on FLAIR. No cortical encephalomalacia or chronic cerebral blood products. Vascular: Major intracranial vascular flow voids are preserved. Skull and upper cervical spine: Normal for age visible cervical spine. Visualized bone marrow signal is within normal limits. Sinuses/Orbits: Negative orbits. Inspissated secretions in the left maxillary and right sphenoid sinuses. No other sinus fluid levels. Other: Intubated. Small volume of fluid in the visible pharynx. Associated trace mastoid fluid. Grossly normal visible internal auditory structures. IMPRESSION: 1. No acute intracranial abnormality. 2. Largely normal for age noncontrast MRI appearance of the brain. Electronically Signed   By: Genevie Ann M.D.   On: 08/10/2019 15:07   US RENAL  Result Date: 08/12/2019 CLINICAL DATA:  Initial evaluation for acute renal failure. EXAM: RENAL / URINARY TRACT ULTRASOUND COMPLETE COMPARISON:  Prior radiograph from 07/28/2019 FINDINGS: Right Kidney: Renal measurements: 8.8 x 4.9 x 4.5 cm = volume: 100.2 mL. Renal echogenicity within normal limits. No nephrolithiasis or hydronephrosis. 1.8 x 1.7 x 1.8 cm simple cyst present at the interpolar region. Left Kidney: Renal measurements: 9.0 x 5.4 x 5.0 cm = volume: 125.9 mL. Renal echogenicity within normal limits. No nephrolithiasis or hydronephrosis. No focal renal mass. Bladder: Decompressed  with a Foley catheter in place and not well visualized. Other: None. IMPRESSION: 1. No hydronephrosis or other acute abnormality. 2. 1.8 cm simple cyst at the interpolar right kidney. Electronically Signed   By: Jeannine Boga M.D.   On: 08/12/2019 08:18   DG Chest Port 1 View  Result Date: 08/10/2019 CLINICAL DATA:  Hypoxia EXAM: PORTABLE CHEST 1 VIEW COMPARISON:  August 08, 2019 FINDINGS: Endotracheal tube tip is 4.2 cm above the carina.  Nasogastric tube tip and side port are below the diaphragm. No pneumothorax. There is atelectatic change in the left mid lung. Lungs elsewhere are clear. Heart is mildly enlarged with pulmonary vascularity normal. No adenopathy. There is aortic atherosclerosis. No bone lesions. IMPRESSION: Tube positions as described without pneumothorax. Atelectatic change left mid lung. No edema or airspace opacity. Stable cardiac prominence. Aortic Atherosclerosis (ICD10-I70.0). Electronically Signed   By: Lowella Grip III M.D.   On: 08/10/2019 05:02   DG Chest Port 1 View  Result Date: 08/08/2019 CLINICAL DATA:  Hypoxia EXAM: PORTABLE CHEST 1 VIEW COMPARISON:  August 05, 2019. FINDINGS: Endotracheal tube tip is 3.4 cm above the carina. Nasogastric tube tip and side port are in the stomach. No pneumothorax. Lungs are clear. Heart is mildly enlarged with pulmonary vascularity normal. No adenopathy. There is aortic atherosclerosis. No bone lesions. IMPRESSION: Tube positions as described without pneumothorax. Lungs clear. Stable cardiac prominence. Aortic Atherosclerosis (ICD10-I70.0). Electronically Signed   By: Lowella Grip III M.D.   On: 08/08/2019 07:18   DG Chest Port 1 View  Result Date: 08/05/2019 CLINICAL DATA:  Intubated. EXAM: PORTABLE CHEST 1 VIEW COMPARISON:  07/30/2019 FINDINGS: Patient slightly rotated to the right. Endotracheal tube has tip 3.2 cm above the carina. Enteric tube courses into the stomach and off the film as tip is not visualized. Mild hazy prominence of the right perihilar/infrahilar vessels which may be due to mild vascular congestion. Minimal stable left base opacification which may be due to small effusion with atelectasis. Stable borderline cardiomegaly. Remainder of the exam is unchanged. IMPRESSION: 1. Stable borderline cardiomegaly with suggestion of minimal vascular congestion. Mild stable left base opacification which may be due to small amount of pleural fluid/atelectasis. 2.   Tubes and lines as described. Electronically Signed   By: Marin Olp M.D.   On: 08/05/2019 10:45   DG Chest Port 1 View  Result Date: 07/30/2019 CLINICAL DATA:  Acute respiratory failure EXAM: PORTABLE CHEST 1 VIEW COMPARISON:  Radiograph 07/29/2019 FINDINGS: Endotracheal tube tip terminates 3.1 cm from the carina. Transesophageal tube tip terminates below the level of imaging, beyond the GE junction. Telemetry leads overlie the chest. Some mildly coarsened interstitial changes are quite similar to prior accounting for some increasing atelectatic change. No new focal consolidation, pneumothorax or effusion. The aorta is calcified. Borderline cardiomegaly is similar to prior. The remaining cardiomediastinal contours are unremarkable. No acute osseous or soft tissue abnormality. IMPRESSION: 1. Lines and tubes as above. 2. Stable mildly coarsened interstitial changes. 3. Some increasing atelectatic change in the bases. Electronically Signed   By: Lovena Le M.D.   On: 07/30/2019 15:09   DG Chest Port 1 View  Result Date: 07/29/2019 CLINICAL DATA:  85 year old female with respiratory failure EXAM: PORTABLE CHEST 1 VIEW COMPARISON:  07/28/2019 FINDINGS: Cardiomediastinal silhouette unchanged in size and contour. Endotracheal tube terminates 3.8 cm above the carina. Gastric tube terminates in the upper abdomen with the side port persisting at the GE junction. No pneumothorax. Coarsened interstitial markings bilaterally, unchanged. No  new confluent airspace disease. IMPRESSION: Similar appearance of the chest x-rays with bilateral mild interstitial opacities. Unchanged endotracheal tube. Unchanged gastric tube with the side port terminating near the GE junction. Electronically Signed   By: Corrie Mckusick D.O.   On: 07/29/2019 09:13   DG Chest Portable 1 View  Result Date: 07/28/2019 CLINICAL DATA:  Cyst post intubation.  Respiratory failure. EXAM: PORTABLE CHEST 1 VIEW COMPARISON:  08/13/2014 FINDINGS:  Endotracheal tube is now present, 5.3 cm above the carina. Nasogastric tube extends into the upper abdomen just beyond the gastroesophageal junction with its proximal side hole positioned at the gastroesophageal junction. The lungs are symmetrically expanded. No pneumothorax or pleural effusion. Trace interstitial pulmonary edema has developed, likely cardiogenic in nature. Mild cardiomegaly is present new since prior examination. IMPRESSION: Endotracheal tube in appropriate position. Nasogastric tube chest within the gastric lumen. Advancement of 5-10 cm may more optimally position the catheter. Mild cardiogenic failure.  New mild cardiomegaly. Electronically Signed   By: Fidela Salisbury MD   On: 07/28/2019 15:45   DG Abd Portable 1 View  Result Date: 07/28/2019 CLINICAL DATA:  Post intubation EXAM: PORTABLE ABDOMEN - 1 VIEW COMPARISON:  None. FINDINGS: The bowel gas pattern is normal. Tip the NG tube is seen within the proximal stomach. No acute osseous. IMPRESSION: Tip the NG tube in the proximal stomach. Electronically Signed   By: Prudencio Pair M.D.   On: 07/28/2019 15:57     Assessment and Recommendation  85 y.o. female with previous history of paroxysmal nonvalvular atrial fibrillation hypertension hyperlipidemia coronary atherosclerosis diastolic dysfunction congestive heart failure having acute respiratory failure with poor prognosis 1.  Heart rate control with beta-blocker per NG tube and/or calcium channel blocker diltiazem drip as necessary for goal heart rate below 110 bpm.  No use of amiodarone or sotalol due to side effects of previous 2.  No anticoagulation at this time for atrial fibrillation although treatment for deep venous thrombosis would be appropriate 3.  No further cardiac diagnostics necessary at this time 4.  We will follow for above and if necessary will make other changes although with currently assuming and awaiting for possible palliative care discussion and/or further other  treatments Signed, Serafina Royals M.D. FACC

## 2019-08-13 NOTE — Progress Notes (Signed)
Patient remains intubated. Patient coughing. Some reactions yawning, opening eyes, frowning. Vomited a moderate amount of tube feed this afternoon.  Tube feed held.  Afebrile.  No distress or discomfort noted. Diarrhea continues. Flexiseal intact.  Will continue to monitor.  Daughter at bedside.

## 2019-08-13 NOTE — Progress Notes (Signed)
CRITICAL CARE PROGRESS NOTE  BRIEF PATIENT DESCRIPTION Severe ACUTE Hypoxic and Hypercapnic Respiratory Failurefrom severe angioedemaandsevere airway obstruction, with severe metabolic encephalopathy  SIGNIFICANT EVENTS 7/16 admitted to ICU for severe angioedema 7/17 severe angioedema, resp failure 7/18 severe angioedema, resp failure 7/19 severe andioedema, resp failure 7/20 severe angioedema, resp failure 08/03/19- Daughter [name] is at bedside, we discussed hospital course and care plan together. Questions answered. 08/04/19- patient is s/p FFP with significant improvement of severely swollen lips/tongue. 08/05/19 - patient has small cuff leak this am with overall improvement in a lingual and orofacial swelling however still has significant angioedema. 08/06/19-patient had additional improvement in angioedema. She had episode of AFrVR while family was present and they did say this happens to her occasionally, this was remedied with 2.5 lopressor x1 and po maintance metop 12.2bid. 7/26 cardiology consulted for afib, remains encephlopathic 7/27 remains encephalopathic 7/29 remains encephalopathic; MRI NEGATIVE for any acute intracranial abnormality 7/30: Consult Neurology for persistent Encephalopathy, thyroid function consistent with hyperthyroidism, query apathetic hyperthyroidism 8/01: Palliative care consult placed, need to establish goals of care.  May need tracheostomy.   CC  follow up respiratory failure & Encephalopathy  SUBJECTIVE Remains Encephalopathic On propranolol for control of heart rate Remains critically ill  OBJECTIVE  BP (!) 139/76   Pulse 102   Temp 98.7 F (37.1 C) (Axillary)   Resp 21   Ht 5' 2.01" (1.575 m)   Wt 86.2 kg   LMP  (LMP Unknown)   SpO2 98%   BMI 34.75 kg/m    I/O last 3 completed shifts: In: 4389.3 [I.V.:2639.3; NG/GT:1750] Out: 2800 [Urine:2800] Total I/O In: 6 [I.V.:6] Out: 150 [Urine:150]  SpO2: 98 % FiO2 (%): 21  %  Estimated body mass index is 34.75 kg/m as calculated from the following:   Height as of this encounter: 5' 2.01" (1.575 m).   Weight as of this encounter: 86.2 kg.  Vent Mode: PRVC FiO2 (%):  [21 %] 21 % Set Rate:  [15 bmp] 15 bmp Vt Set:  [500 mL] 500 mL PEEP:  [5 cmH20] 5 cmH20 Plateau Pressure:  [16 cmH20-18 cmH20] 16 cmH20%   REVIEW OF SYSTEMS PATIENT IS UNABLE TO PROVIDE COMPLETE REVIEW OF SYSTEMS DUE TO SEVERE CRITICAL ILLNESS, INTUBATION     PHYSICAL EXAMINATION: GENERAL: Chronically/critically ill-appearing female, laying in bed, intubated, obtunded on no sedation HEAD: Atraumatic, normocephalic EYES: Pupils PERRLA, no scleral icterus, downward gaze MOUTH: Moist mucous membranes, terrible dentition, ET tube in place.  No angioedema NECK: Supple, trachea midline.  No JVD. PULMONARY: Coarse to auscultation throughout, no wheezing, vent assisted, even CARDIOVASCULAR: Irregularly irregular rhythm, no murmurs, rubs, gallops GASTROINTESTINAL: Soft, nontender, nondistended, no guarding or rebound tenderness, bowel sounds positive x4 MUSCULOSKELETAL: No deformities, no edema   NEUROLOGIC: Obtunded, will intermittently open eyes to painful stimuli, doesn't follow commands SKIN: Warm and dry.  No obvious rashes, lesions, ulcerations   CULTURE RESULTS   No results found for this or any previous visit (from the past 240 hour(s)).    IMAGING    No results found.  Nutrition Status: Nutrition Problem: Inadequate oral intake Etiology: inability to eat (pt sedated and ventilated) Signs/Symptoms: NPO status Interventions: Tube feeding   BMP Latest Ref Rng & Units 08/13/2019 08/12/2019 08/11/2019  Glucose 70 - 99 mg/dL 172(H) 146(H) 171(H)  BUN 8 - 23 mg/dL 59(H) 78(H) 97(H)  Creatinine 0.44 - 1.00 mg/dL 0.76 0.96 1.19(H)  BUN/Creat Ratio 6 - 22 (calc) - - -  Sodium 135 - 145  mmol/L 138 140 146(H)  Potassium 3.5 - 5.1 mmol/L 3.1(L) 3.5 4.3  Chloride 98 - 111 mmol/L 109  107 115(H)  CO2 22 - 32 mmol/L 22 19(L) 22  Calcium 8.9 - 10.3 mg/dL 8.1(L) 8.4(L) 8.9   Scheduled Meds: . chlorhexidine gluconate (MEDLINE KIT)  15 mL Mouth Rinse BID  . Chlorhexidine Gluconate Cloth  6 each Topical Daily  . docusate  100 mg Per Tube BID  . enoxaparin (LOVENOX) injection  40 mg Subcutaneous Q24H  . free water  200 mL Per Tube Q4H  . hydrALAZINE  25 mg Per Tube Q8H  . insulin aspart  0-20 Units Subcutaneous Q4H  . mouth rinse  15 mL Mouth Rinse 10 times per day  . multivitamin with minerals  1 tablet Per Tube Daily  . pantoprazole sodium  40 mg Per Tube Daily  . propranolol  20 mg Oral TID  . propylthiouracil  100 mg Per Tube Q8H   Followed by  . [START ON 08/14/2019] propylthiouracil  50 mg Per Tube Q8H  . sodium chloride flush  3 mL Intravenous Q12H  . thiamine  100 mg Per Tube Daily   Continuous Infusions: . sodium chloride 75 mL/hr at 08/13/19 0659  . sodium chloride 250 mL (08/10/19 1547)  . sodium chloride Stopped (08/11/19 1601)  . feeding supplement (VITAL HIGH PROTEIN) 50 mL/hr at 08/13/19 0600   PRN Meds:.sodium chloride, acetaminophen, fentaNYL, hydrALAZINE, LORazepam, ondansetron (ZOFRAN) IV, polyethylene glycol, sodium chloride flush   Indwelling Urinary Catheter continued, requirement due to   Reason to continue Indwelling Urinary Catheter strict Intake/Output monitoring for hemodynamic instability         Ventilator continued, requirement due to severe respiratory failure   Ventilator Sedation RASS 0 to -2     ASSESSMENT AND PLAN   Severe ACUTE Hypoxic and Hypercapnic Respiratory Failure in the setting of severe angioedema and severe airway obstruction Angioedema can be seen with autoimmune thyroiditis Patient has had amiodarone associated autoimmune thyroiditis -Full vent support -Wean FiO2 and PEEP as tolerated to maintain O2 saturations greater than 92% -VAP protocol -Spontaneous breathing trials when respiratory parameters met and  mental status permits -Assess cuff leak daily -May need tracheostomy due to inability to perform SBT, persistent encephalopathy -SBT when meets criteria   ACUTE KIDNEY INJURY/Renal Failure - Likely due to excessive Diuretics now improving Mild Hypernatremia -Monitor I&O's / urinary output -Follow BMP, renal function improving -Ensure adequate renal perfusion -Avoid nephrotoxic agents as able -Avoid diuretics -Replace electrolytes as indicated -Free water flushes -24 hour Urine collection -Nephrology input appreciated   Acute toxic metabolic encephalopathy -Provide supportive care -Avoid sedating medications as able -MRI brain 08/02/2019 NEGATIVE for any acute intracranial abnormality. -EEG with severe diffuse encephalopathy, nonspecific etiology. No seizures or epileptiform discharges were seen  -May be cephalopathy associated with hyperthyroidism -Trial of steroids as below: Treatments (mg/days) Duration (days) Intravenous pulse methylprednisolone  500 3 Dose of oral prednisolone  30 10  25 10  20 10  15 10  10 10  5 30   Hx of thyroiditis due to Amiodarone toxicity -On PTU -Continue Propanolol -Antithyroid antibodies pending -Recheck TSH, Free T4 and T3 on tomorrow  Atrial Fibrillation w/ RVR Chronic Diastolic CHF without acute exacerbation Hypertension Hx: CAD, HTN -Continuous cardiac monitor -Maintain MAP greater than 65 -Cardiology following, appreciate input -Continue propranolol propanolol -Cardiology recommends deferring chronic anticoagulation to outpatient cardiologist  Hyperglycemia -CBGs -Sliding scale insulin -Follow ICU hypo/hyperglycemia protocol     We will  give trial of steroids as above to see if mentation improves.  We are approaching 3-week mark of intubation and will need to make decisions whether to proceed with tracheostomy versus one-way extubation/comfort care.  Will obtain palliative care consultation.  May need ENT consultation for  potential trach.   Pt is critically ill, prognosis is guarded.  She is at high risk for cardiac arrest and death.   BEST PRACTICES DISPOSITION: ICU GOALS OF CARE: Full Code VTE PROPHYLAXIS: SQ Lovenox STRESS ULCER PROPHYLAXIS: IV Pepcid CONSULTS: Cardiology, Neurology, Nephrology UPDATES: Update family daily.   Total critical care time: 40 minutes  C. Derrill Kay, MD [facility]  *This note was dictated using voice recognition software/Dragon.  Despite best efforts to proofread, errors can occur which can change the meaning.  Any change was purely unintentional.

## 2019-08-14 DIAGNOSIS — E0590 Thyrotoxicosis, unspecified without thyrotoxic crisis or storm: Secondary | ICD-10-CM

## 2019-08-14 LAB — PROTEIN ELECTROPHORESIS, SERUM
A/G Ratio: 0.8 (ref 0.7–1.7)
Albumin ELP: 2.4 g/dL — ABNORMAL LOW (ref 2.9–4.4)
Alpha-1-Globulin: 0.2 g/dL (ref 0.0–0.4)
Alpha-2-Globulin: 1.4 g/dL — ABNORMAL HIGH (ref 0.4–1.0)
Beta Globulin: 0.8 g/dL (ref 0.7–1.3)
Gamma Globulin: 0.9 g/dL (ref 0.4–1.8)
Globulin, Total: 3.2 g/dL (ref 2.2–3.9)
Total Protein ELP: 5.6 g/dL — ABNORMAL LOW (ref 6.0–8.5)

## 2019-08-14 LAB — TSH: TSH: <0.01 u[IU]/mL — ABNORMAL LOW (ref 0.350–4.500)

## 2019-08-14 LAB — CBC WITH DIFFERENTIAL/PLATELET
Abs Immature Granulocytes: 0.15 10*3/uL — ABNORMAL HIGH (ref 0.00–0.07)
Basophils Absolute: 0 10*3/uL (ref 0.0–0.1)
Basophils Relative: 0 %
Eosinophils Absolute: 0 10*3/uL (ref 0.0–0.5)
Eosinophils Relative: 0 %
HCT: 38.3 % (ref 36.0–46.0)
Hemoglobin: 12.8 g/dL (ref 12.0–15.0)
Immature Granulocytes: 1 %
Lymphocytes Relative: 3 %
Lymphs Abs: 0.5 10*3/uL — ABNORMAL LOW (ref 0.7–4.0)
MCH: 29.2 pg (ref 26.0–34.0)
MCHC: 33.4 g/dL (ref 30.0–36.0)
MCV: 87.4 fL (ref 80.0–100.0)
Monocytes Absolute: 0.8 10*3/uL (ref 0.1–1.0)
Monocytes Relative: 5 %
Neutro Abs: 14.4 10*3/uL — ABNORMAL HIGH (ref 1.7–7.7)
Neutrophils Relative %: 91 %
Platelets: 126 10*3/uL — ABNORMAL LOW (ref 150–400)
RBC: 4.38 MIL/uL (ref 3.87–5.11)
RDW: 14 % (ref 11.5–15.5)
WBC: 15.9 10*3/uL — ABNORMAL HIGH (ref 4.0–10.5)
nRBC: 0 % (ref 0.0–0.2)

## 2019-08-14 LAB — T4, FREE: Free T4: 1.41 ng/dL — ABNORMAL HIGH (ref 0.61–1.12)

## 2019-08-14 LAB — TRIGLYCERIDES: Triglycerides: 60 mg/dL (ref <150)

## 2019-08-14 LAB — RENAL FUNCTION PANEL
Albumin: 2.3 g/dL — ABNORMAL LOW (ref 3.5–5.0)
Anion gap: 10 (ref 5–15)
BUN: 44 mg/dL — ABNORMAL HIGH (ref 8–23)
CO2: 16 mmol/L — ABNORMAL LOW (ref 22–32)
Calcium: 8.3 mg/dL — ABNORMAL LOW (ref 8.9–10.3)
Chloride: 107 mmol/L (ref 98–111)
Creatinine, Ser: 0.68 mg/dL (ref 0.44–1.00)
GFR calc Af Amer: >60 mL/min (ref >60)
GFR calc non Af Amer: >60 mL/min (ref >60)
Glucose, Bld: 163 mg/dL — ABNORMAL HIGH (ref 70–99)
Phosphorus: 3.6 mg/dL (ref 2.5–4.6)
Potassium: 3 mmol/L — ABNORMAL LOW (ref 3.5–5.1)
Sodium: 133 mmol/L — ABNORMAL LOW (ref 135–145)

## 2019-08-14 LAB — MAGNESIUM: Magnesium: 2.7 mg/dL — ABNORMAL HIGH (ref 1.7–2.4)

## 2019-08-14 LAB — GLUCOSE, CAPILLARY
Glucose-Capillary: 111 mg/dL — ABNORMAL HIGH (ref 70–99)
Glucose-Capillary: 135 mg/dL — ABNORMAL HIGH (ref 70–99)
Glucose-Capillary: 140 mg/dL — ABNORMAL HIGH (ref 70–99)
Glucose-Capillary: 141 mg/dL — ABNORMAL HIGH (ref 70–99)
Glucose-Capillary: 157 mg/dL — ABNORMAL HIGH (ref 70–99)
Glucose-Capillary: 221 mg/dL — ABNORMAL HIGH (ref 70–99)

## 2019-08-14 MED ORDER — POTASSIUM CHLORIDE 20 MEQ PO PACK
40.0000 meq | PACK | ORAL | Status: AC
  Administered 2019-08-14 (×2): 40 meq
  Filled 2019-08-14 (×2): qty 2

## 2019-08-14 MED ORDER — GLYCOPYRROLATE 0.2 MG/ML IJ SOLN
0.1000 mg | Freq: Two times a day (BID) | INTRAMUSCULAR | Status: DC
  Administered 2019-08-14 – 2019-08-18 (×10): 0.1 mg via INTRAVENOUS
  Filled 2019-08-14 (×10): qty 1

## 2019-08-14 MED ORDER — FREE WATER
30.0000 mL | Status: DC
  Administered 2019-08-14 – 2019-08-18 (×21): 30 mL

## 2019-08-14 MED ORDER — PROPRANOLOL HCL 20 MG PO TABS
20.0000 mg | ORAL_TABLET | Freq: Three times a day (TID) | ORAL | Status: DC
  Administered 2019-08-14 – 2019-08-18 (×13): 20 mg
  Filled 2019-08-14 (×13): qty 1

## 2019-08-14 NOTE — Progress Notes (Signed)
PHARMACY CONSULT NOTE  Pharmacy Consult for Electrolyte Monitoring and Replacement   Recent Labs: Potassium (mmol/L)  Date Value  08/14/2019 3.0 (L)  02/11/2014 4.3   Magnesium (mg/dL)  Date Value  48/18/5631 2.7 (H)  02/07/2014 1.8   Calcium (mg/dL)  Date Value  49/70/2637 8.3 (L)   Calcium, Total (mg/dL)  Date Value  85/88/5027 9.4   Albumin (g/dL)  Date Value  74/12/8784 2.3 (L)  02/11/2014 2.9 (L)   Phosphorus (mg/dL)  Date Value  76/72/0947 3.6   Sodium (mmol/L)  Date Value  08/14/2019 133 (L)  09/18/2014 142  02/11/2014 139     Assessment: 85 year old female admitted to the ICU with angioedema s/t ACE inhibitor use. Patient intubated 7/16. Angioedema improved, patient with encephalopathy and significant hyperthyroidism. Remains intubated, off sedation. Pharmacy to manage electrolytes.  Goal of Therapy:  Electrolytes WNL  Plan:  Potassium 40 mEq per tube for two doses. Electrolytes with morning labs.  Pricilla Riffle ,PharmD Clinical Pharmacist 08/14/2019 1:20 PM

## 2019-08-14 NOTE — Progress Notes (Signed)
   08/14/19 1000  Clinical Encounter Type  Visited With Patient and family together  Visit Type Follow-up  Referral From Chaplain  Consult/Referral To Chaplain  Chaplain briefly visited with patient and family. Before leaving chaplain asked if she could pray with them and they said yes. Chaplain will follow up with them.

## 2019-08-14 NOTE — Progress Notes (Signed)
CRITICAL CARE PROGRESS NOTE  BRIEF PATIENT DESCRIPTION Severe ACUTE Hypoxic and Hypercapnic Respiratory Failurefrom severe angioedemaandsevere airway obstruction, with severe metabolic encephalopathy  SIGNIFICANT EVENTS 7/16 admitted to ICU for severe angioedema 7/17 severe angioedema, resp failure 7/18 severe angioedema, resp failure 7/19 severe andioedema, resp failure 7/20 severe angioedema, resp failure 08/03/19- Daughter [name] is at bedside, we discussed hospital course and care plan together. Questions answered. 08/04/19- patient is s/p FFP with significant improvement of severely swollen lips/tongue. 08/05/19 - patient has small cuff leak this am with overall improvement in a lingual and orofacial swelling however still has significant angioedema. 08/06/19-patient had additional improvement in angioedema. She had episode of AFrVR while family was present and they did say this happens to her occasionally, this was remedied with 2.5 lopressor x1 and po maintance metop 12.2bid. 7/26 cardiology consulted for afib, remains encephlopathic 7/27 remains encephalopathic 7/29 remains encephalopathic; MRI NEGATIVE for any acute intracranial abnormality 7/30: Consult Neurology for persistent Encephalopathy, thyroid function consistent with hyperthyroidism, query apathetic hyperthyroidism 8/01: Palliative care consult placed, need to establish goals of care.  May need tracheostomy.   CC  Follow up resp failure and encephalopathy   SUBJECTIVE Remains encephalopathic Severe hyperthyroidism Remains critically ill Family at bedside updated  OBJECTIVE  BP (!) 154/75   Pulse 45   Temp 97.9 F (36.6 C) (Axillary)   Resp 15   Ht 5' 2.01" (1.575 m)   Wt 86.2 kg   LMP  (LMP Unknown)   SpO2 100%   BMI 34.75 kg/m    I/O last 3 completed shifts: In: 3084.3 [I.V.:2480.3; NG/GT:550; IV Piggyback:54] Out: 0355 [Urine:2450; Emesis/NG output:5; Stool:900] Total I/O In: 9741  [I.V.:587; NG/GT:1145] Out: 225 [Urine:225]  SpO2: 100 % FiO2 (%): 21 %  Estimated body mass index is 34.75 kg/m as calculated from the following:   Height as of this encounter: 5' 2.01" (1.575 m).   Weight as of this encounter: 86.2 kg.  Vent Mode: PSV FiO2 (%):  [21 %] 21 % Set Rate:  [15 bmp] 15 bmp Vt Set:  [500 mL] 500 mL PEEP:  [5 cmH20] 5 cmH20 Pressure Support:  [5 cmH20] 5 cmH20 Plateau Pressure:  [16 cmH20-17 cmH20] 17 cmH20%   REVIEW OF SYSTEMS  PATIENT IS UNABLE TO PROVIDE COMPLETE REVIEW OF SYSTEM S DUE TO SEVERE CRITICAL ILLNESS AND ENCEPHALOPATHY  PHYSICAL EXAMINATION:  GENERAL:critically ill appearing, +resp distress HEAD: Normocephalic, atraumatic.  EYES: Pupils equal, round, reactive to light.  No scleral icterus.  MOUTH: Moist mucosal membrane. NECK: Supple. No thyromegaly. No nodules. No JVD.  PULMONARY: +rhonchi, +wheezing CARDIOVASCULAR: S1 and S2. Regular rate and rhythm. No murmurs, rubs, or gallops.  GASTROINTESTINAL: Soft, nontender, -distended. Positive bowel sounds.  MUSCULOSKELETAL: + edema.  NEUROLOGIC: obtunded SKIN:intact,warm,dry     CULTURE RESULTS   No results found for this or any previous visit (from the past 240 hour(s)).    IMAGING    DG Abd 1 View  Result Date: 08/13/2019 CLINICAL DATA:  Nausea and vomiting. EXAM: ABDOMEN - 1 VIEW COMPARISON:  Radiograph 07/28/2019, renal ultrasound 08/12/2019 FINDINGS: Tip and side port of the enteric tube below the diaphragm in the stomach. No bowel dilatation to suggest obstruction. No significant formed stool. Surgical clips in the right upper quadrant are typical of cholecystectomy. Multiple pelvic phleboliths. Two calcifications to the left of L4 unchanged, may be phleboliths in the gonadal veins. Rectal tube in place. Peripherally sclerotic lucent lesion in the right proximal femoral shaft is nonspecific but has nonaggressive  imaging features. IMPRESSION: 1. Tip and side port of the  enteric tube below the diaphragm in the stomach. 2. Nonobstructive bowel gas pattern. Electronically Signed   By: Keith Rake M.D.   On: 08/13/2019 22:43    Nutrition Status: Nutrition Problem: Inadequate oral intake Etiology: inability to eat (pt sedated and ventilated) Signs/Symptoms: NPO status Interventions: Tube feeding   BMP Latest Ref Rng & Units 08/14/2019 08/13/2019 08/12/2019  Glucose 70 - 99 mg/dL 163(H) 172(H) 146(H)  BUN 8 - 23 mg/dL 44(H) 59(H) 78(H)  Creatinine 0.44 - 1.00 mg/dL 0.68 0.76 0.96  BUN/Creat Ratio 6 - 22 (calc) - - -  Sodium 135 - 145 mmol/L 133(L) 138 140  Potassium 3.5 - 5.1 mmol/L 3.0(L) 3.1(L) 3.5  Chloride 98 - 111 mmol/L 107 109 107  CO2 22 - 32 mmol/L 16(L) 22 19(L)  Calcium 8.9 - 10.3 mg/dL 8.3(L) 8.1(L) 8.4(L)   Scheduled Meds: . chlorhexidine gluconate (MEDLINE KIT)  15 mL Mouth Rinse BID  . Chlorhexidine Gluconate Cloth  6 each Topical Daily  . docusate  100 mg Per Tube BID  . enoxaparin (LOVENOX) injection  40 mg Subcutaneous Q24H  . free water  30 mL Per Tube Q4H  . glycopyrrolate  0.1 mg Intravenous BID  . hydrALAZINE  25 mg Per Tube Q8H  . insulin aspart  0-20 Units Subcutaneous Q4H  . mouth rinse  15 mL Mouth Rinse 10 times per day  . multivitamin with minerals  1 tablet Per Tube Daily  . pantoprazole sodium  40 mg Per Tube Daily  . potassium chloride  40 mEq Per Tube Q4H  . propranolol  20 mg Per Tube TID  . propylthiouracil  50 mg Per Tube Q8H  . sodium chloride flush  3 mL Intravenous Q12H  . thiamine  100 mg Per Tube Daily   Continuous Infusions: . sodium chloride 75 mL/hr at 08/14/19 1042  . sodium chloride 250 mL (08/10/19 1547)  . sodium chloride Stopped (08/11/19 1601)  . feeding supplement (VITAL HIGH PROTEIN) 30 mL/hr at 08/14/19 1000  . methylPREDNISolone (SOLU-MEDROL) injection Stopped (08/13/19 1657)   PRN Meds:.sodium chloride, acetaminophen, fentaNYL, hydrALAZINE, LORazepam, ondansetron (ZOFRAN) IV, polyethylene  glycol, sodium chloride flush      Indwelling Urinary Catheter continued, requirement due to   Reason to continue Indwelling Urinary Catheter strict Intake/Output monitoring for hemodynamic instability         Ventilator continued, requirement due to severe respiratory failure   Ventilator Sedation RASS 0 to -2     ASSESSMENT AND PLAN   Severe ACUTE Hypoxic and Hypercapnic Respiratory Failure in the setting of severe angioedema and severe airway obstruction Angioedema can be seen with autoimmune thyroiditis Patient has had amiodarone associated autoimmune thyroiditis   Severe ACUTE Hypoxic and Hypercapnic Respiratory Failure -continue Mechanical Ventilator support -continue Bronchodilator Therapy -Wean Fio2 and PEEP as tolerated -VAP/VENT bundle implementation -will perform SAT/SBT when respiratory parameters are met Hopefully wont need TRACH if patient wakes up more  ACUTE KIDNEY INJURY/Renal Failure -continue Foley Catheter-assess need -Avoid nephrotoxic agents -Follow urine output, BMP -Ensure adequate renal perfusion, optimize oxygenation -Renal dose medications -Avoid nephrotoxic agents as able -Avoid diuretics -Replace electrolytes as indicated -Free water flushes    NEUROLOGY Acute toxic metabolic encephalopathy -Avoid sedating medications as able -MRI brain 08/02/2019 NEGATIVE for any acute intracranial abnormality. -EEG with severe diffuse encephalopathy, nonspecific etiology. 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 -On PTU -Continue Propanolol -Antithyroid antibodies pending  afib with RVR Diastolic heart failure -Continue propranolol propanolol -Cardiology  recommends deferring chronic anticoagulation to outpatient cardiologist    ELECTROLYTES -follow labs as needed -replace as needed -pharmacy consultation and following   DVT/GI PRX ordered and assessed TRANSFUSIONS AS NEEDED MONITOR FSBS I Assessed the need for Labs I Assessed the need for Foley I Assessed the need for Central Venous Line Family Discussion when available I Assessed the need for Mobilization I made an Assessment of medications to be adjusted accordingly Safety Risk assessment completed  CASE DISCUSSED IN MULTIDISCIPLINARY ROUNDS WITH ICU TEAM     Critical Care Time devoted to patient care services described in this note is 35 minutes.   Overall, patient is critically ill, prognosis is guarded.  Patient with Multiorgan failure and at high risk for cardiac arrest and death.   Daughters at bedside updated, will plan to see if Neuro Status changes and improves  Maretta Bees Patricia Pesa, M.D.  [facility]  Medical Director [place] Director [facility] Cardio-Pulmonary Department

## 2019-08-15 LAB — THYROID ANTIBODIES
Thyroglobulin Antibody: <1 IU/mL (ref 0.0–0.9)
Thyroperoxidase Ab SerPl-aCnc: 81 IU/mL — ABNORMAL HIGH (ref 0–34)

## 2019-08-15 LAB — TRIGLYCERIDES: Triglycerides: 67 mg/dL (ref <150)

## 2019-08-15 LAB — CBC WITH DIFFERENTIAL/PLATELET
Abs Immature Granulocytes: 0.1 10*3/uL — ABNORMAL HIGH (ref 0.00–0.07)
Basophils Absolute: 0 10*3/uL (ref 0.0–0.1)
Basophils Relative: 0 %
Eosinophils Absolute: 0 10*3/uL (ref 0.0–0.5)
Eosinophils Relative: 0 %
HCT: 33.4 % — ABNORMAL LOW (ref 36.0–46.0)
Hemoglobin: 11 g/dL — ABNORMAL LOW (ref 12.0–15.0)
Immature Granulocytes: 1 %
Lymphocytes Relative: 4 %
Lymphs Abs: 0.5 10*3/uL — ABNORMAL LOW (ref 0.7–4.0)
MCH: 29.3 pg (ref 26.0–34.0)
MCHC: 32.9 g/dL (ref 30.0–36.0)
MCV: 89.1 fL (ref 80.0–100.0)
Monocytes Absolute: 0.7 10*3/uL (ref 0.1–1.0)
Monocytes Relative: 5 %
Neutro Abs: 11.9 10*3/uL — ABNORMAL HIGH (ref 1.7–7.7)
Neutrophils Relative %: 90 %
Platelets: 135 10*3/uL — ABNORMAL LOW (ref 150–400)
RBC: 3.75 MIL/uL — ABNORMAL LOW (ref 3.87–5.11)
RDW: 14.3 % (ref 11.5–15.5)
WBC: 13.1 10*3/uL — ABNORMAL HIGH (ref 4.0–10.5)
nRBC: 0 % (ref 0.0–0.2)

## 2019-08-15 LAB — GLUCOSE, CAPILLARY
Glucose-Capillary: 206 mg/dL — ABNORMAL HIGH (ref 70–99)
Glucose-Capillary: 211 mg/dL — ABNORMAL HIGH (ref 70–99)
Glucose-Capillary: 165 mg/dL — ABNORMAL HIGH (ref 70–99)
Glucose-Capillary: 206 mg/dL — ABNORMAL HIGH (ref 70–99)
Glucose-Capillary: 211 mg/dL — ABNORMAL HIGH (ref 70–99)
Glucose-Capillary: 221 mg/dL — ABNORMAL HIGH (ref 70–99)
Glucose-Capillary: 241 mg/dL — ABNORMAL HIGH (ref 70–99)
Glucose-Capillary: 106 mg/dL — ABNORMAL HIGH (ref 70–99)
Glucose-Capillary: 83 mg/dL (ref 70–99)
Glucose-Capillary: 139 mg/dL — ABNORMAL HIGH (ref 70–99)

## 2019-08-15 LAB — RENAL FUNCTION PANEL
Albumin: 2 g/dL — ABNORMAL LOW (ref 3.5–5.0)
Anion gap: 6 (ref 5–15)
BUN: 50 mg/dL — ABNORMAL HIGH (ref 8–23)
CO2: 17 mmol/L — ABNORMAL LOW (ref 22–32)
Calcium: 8.1 mg/dL — ABNORMAL LOW (ref 8.9–10.3)
Chloride: 111 mmol/L (ref 98–111)
Creatinine, Ser: 0.83 mg/dL (ref 0.44–1.00)
GFR calc Af Amer: >60 mL/min (ref >60)
GFR calc non Af Amer: >60 mL/min (ref >60)
Glucose, Bld: 248 mg/dL — ABNORMAL HIGH (ref 70–99)
Phosphorus: 2.7 mg/dL (ref 2.5–4.6)
Potassium: 4.2 mmol/L (ref 3.5–5.1)
Sodium: 134 mmol/L — ABNORMAL LOW (ref 135–145)

## 2019-08-15 LAB — T3: T3, Total: 40 ng/dL — ABNORMAL LOW (ref 71–180)

## 2019-08-15 LAB — MAGNESIUM: Magnesium: 2.5 mg/dL — ABNORMAL HIGH (ref 1.7–2.4)

## 2019-08-15 MED ORDER — PROPOFOL 1000 MG/100ML IV EMUL
5.0000 ug/kg/min | INTRAVENOUS | Status: DC
  Administered 2019-08-15: 5 ug/kg/min via INTRAVENOUS
  Filled 2019-08-15: qty 100

## 2019-08-15 NOTE — Progress Notes (Signed)
PHARMACY CONSULT NOTE  Pharmacy Consult for Electrolyte Monitoring and Replacement   Recent Labs: Potassium (mmol/L)  Date Value  08/15/2019 4.2  02/11/2014 4.3   Magnesium (mg/dL)  Date Value  76/81/1572 2.5 (H)  02/07/2014 1.8   Calcium (mg/dL)  Date Value  62/03/5595 8.1 (L)   Calcium, Total (mg/dL)  Date Value  41/63/8453 9.4   Albumin (g/dL)  Date Value  64/68/0321 2.0 (L)  02/11/2014 2.9 (L)   Phosphorus (mg/dL)  Date Value  22/48/2500 2.7   Sodium (mmol/L)  Date Value  08/15/2019 134 (L)  09/18/2014 142  02/11/2014 139     Assessment: 85 year old female admitted to the ICU with angioedema s/t ACE inhibitor use. Patient intubated 7/16. Angioedema improved, patient with encephalopathy and significant hyperthyroidism. Remains intubated, off sedation. Pharmacy to manage electrolytes.  Goal of Therapy:  Electrolytes WNL  Plan:  Electrolytes have improved. Continues to receive tube feeds. Assessing for extubation today. Electrolytes with morning labs.  Pricilla Riffle ,PharmD Clinical Pharmacist 08/15/2019 11:11 AM

## 2019-08-15 NOTE — Plan of Care (Signed)
Pt on 21% and 5/5 press control most of this shift, all sedation is off.  She is very slowly awakening, is trying very hard to open her eyes on command, unable to follow commands to squeeze with her hands, seems to be very weak.  TF off most of shift in anticipation of extubation, per Dr Belia Heman leave off for now.  Daughters at bedside.

## 2019-08-15 NOTE — Progress Notes (Signed)
CRITICAL CARE NOTE BRIEF PATIENT DESCRIPTION Severe ACUTE Hypoxic and Hypercapnic Respiratory Failurefrom severe angioedemaandsevere airway obstruction, with severe metabolic encephalopathy  SIGNIFICANT EVENTS 7/16 admitted to ICU for severe angioedema 7/17 severe angioedema, resp failure 7/18 severe angioedema, resp failure 7/19 severe andioedema, resp failure 7/20 severe angioedema, resp failure 08/03/19- Daughter [name] is at bedside, we discussed hospital course and care plan together. Questions answered. 08/04/19- patient is s/p FFP with significant improvement of severely swollen lips/tongue. 08/05/19 - patient has small cuff leak this am with overall improvement in a lingual and orofacial swelling however still has significant angioedema. 08/06/19-patient had additional improvement in angioedema. She had episode of AFrVR while family was present and they did say this happens to her occasionally, this was remedied with 2.5 lopressor x1 and po maintance metop 12.2bid. 7/26 cardiology consulted for afib, remains encephlopathic 7/27 remains encephalopathic 7/29 remains encephalopathic; MRI NEGATIVE for any acute intracranial abnormality 7/30: Consult Neurology for persistent Encephalopathy, thyroid function consistent with hyperthyroidism, query apathetic hyperthyroidism 8/01: Palliative care consult placed, need to establish goals of care.  May need tracheostomy.   CC  follow up respiratory failure  SUBJECTIVE Patient remains critically ill Prognosis is guarded   BP (!) 164/86   Pulse 80   Temp 98.2 F (36.8 C) (Axillary)   Resp 20   Ht 5' 2.01" (1.575 m)   Wt 84.2 kg   LMP  (LMP Unknown)   SpO2 98%   BMI 33.94 kg/m    I/O last 3 completed shifts: In: 4436.5 [I.V.:2395; UX/NA:3557.3; IV Piggyback:104] Out: 1900 [Urine:1650; Stool:250] Total I/O In: 60 [NG/GT:60] Out: -   SpO2: 98 % FiO2 (%): 21 %  Estimated body mass index is 33.94 kg/m as calculated from  the following:   Height as of this encounter: 5' 2.01" (1.575 m).   Weight as of this encounter: 84.2 kg.  SIGNIFICANT EVENTS   REVIEW OF SYSTEMS  PATIENT IS UNABLE TO PROVIDE COMPLETE REVIEW OF SYSTEMS DUE TO SEVERE CRITICAL ILLNESS        PHYSICAL EXAMINATION:  GENERAL:critically ill appearing, +resp distress HEAD: Normocephalic, atraumatic.  EYES: Pupils equal, round, reactive to light.  No scleral icterus.  MOUTH: Moist mucosal membrane. NECK: Supple.  PULMONARY: +rhonchi, +wheezing CARDIOVASCULAR: S1 and S2. Regular rate and rhythm. No murmurs, rubs, or gallops.  GASTROINTESTINAL: Soft, nontender, -distended.  Positive bowel sounds.   MUSCULOSKELETAL: No swelling, clubbing, or edema.  NEUROLOGIC: obtunded, GCS<8 SKIN:intact,warm,dry  MEDICATIONS: I have reviewed all medications and confirmed regimen as documented   CULTURE RESULTS   No results found for this or any previous visit (from the past 240 hour(s)).        IMAGING    No results found.   Nutrition Status: Nutrition Problem: Inadequate oral intake Etiology: inability to eat (pt sedated and ventilated) Signs/Symptoms: NPO status Interventions: Tube feeding     Indwelling Urinary Catheter continued, requirement due to   Reason to continue Indwelling Urinary Catheter strict Intake/Output monitoring for hemodynamic instability         Ventilator continued, requirement due to severe respiratory failure   Ventilator Sedation RASS 0 to -2      ASSESSMENT AND PLAN SYNOPSIS Severe ACUTE Hypoxic and Hypercapnic Respiratory Failure in the setting of severe angioedema and severe airway obstruction Angioedema can be seen with autoimmune thyroiditis Patient has had amiodarone associated autoimmune thyroiditis   Severe ACUTE Hypoxic and Hypercapnic Respiratory Failure -continue Full MV support -continue Bronchodilator Therapy -Wean Fio2 and PEEP as tolerated -will  perform SAT/SBT when  respiratory parameters are met -VAP/VENT bundle implementation  Morbid obesity, possible OSA.   Will certainly impact respiratory mechanics, ventilator weaning   ACUTE KIDNEY INJURY/Renal Failure -continue Foley Catheter-assess need -Avoid nephrotoxic agents -Follow urine output, BMP -Ensure adequate renal perfusion, optimize oxygenation -Renal dose medications     NEUROLOGY Acute toxic metabolic encephalopathy -Avoid sedating medications as able -MRI brain 08/02/2019 NEGATIVE for any acute intracranial abnormality. -EEG with severe diffuse encephalopathy, nonspecific etiology.No seizures or epileptiform discharges were seen  encephalopathy associated with hyperthyroidism -Trial of steroids as below: Treatments (mg/days)          Duration (days) Intravenous pulse methylprednisolone  500   3 Dose of oral prednisolone  _0 Hx of thyroiditis due to Amiodarone toxicity -On PTU -Continue Propanolol -Antithyroid antibodies pending   ENDO - will use ICU hypoglycemic\Hyperglycemia protocol if indicated     ELECTROLYTES -follow labs as needed -replace as needed -pharmacy consultation and following   DVT/GI PRX ordered and assessed TRANSFUSIONS AS NEEDED MONITOR FSBS I Assessed the need for Labs I Assessed the need for Foley I Assessed the need for Central Venous Line Family Discussion when available I Assessed the need for Mobilization I made an Assessment of medications to be adjusted accordingly Safety Risk assessment completed   CASE DISCUSSED IN MULTIDISCIPLINARY ROUNDS WITH ICU TEAM  Critical Care Time devoted to patient care services described in this note is 32 minutes.   Overall, patient is critically ill, prognosis is guarded.  Patient with Multiorgan failure and at high risk for cardiac arrest and death.    Plan for SAT/SBT, plan for trial of extubation   Melanie Lewis Patricia Pesa,  M.D.  [facility]  Medical Director [place] Director [facility] Cardio-Pulmonary Department

## 2019-08-15 NOTE — Progress Notes (Signed)
Inpatient Diabetes Program Recommendations  AACE/ADA: New Consensus Statement on Inpatient Glycemic Control   Target Ranges:  Prepandial:   less than 140 mg/dL      Peak postprandial:   less than 180 mg/dL (1-2 hours)      Critically ill patients:  140 - 180 mg/dL   Results for DAJUANA, PALEN (MRN [id number]) as of 08/15/2019 11:52  Ref. Range 08/15/2019 04:05 08/15/2019 04:05 08/15/2019 07:06 08/15/2019 07:06 08/15/2019 11:25  Glucose-Capillary Latest Ref Range: 70 - 99 mg/dL 629 (H) 528 (H) 413 (H) 206 (H) 165 (H)  Results for TAWANNA, FUNK (MRN [id number]) as of 08/15/2019 11:52  Ref. Range 08/14/2019 07:07 08/14/2019 11:24 08/14/2019 16:06 08/14/2019 23:48 08/14/2019 23:48  Glucose-Capillary Latest Ref Range: 70 - 99 mg/dL 536 (H) 644 (H) 034 (H) 221 (H) 221 (H)   Review of Glycemic Control  Current orders for Inpatient glycemic control: Novolog 0-20 units Q4H; Solumedrol 500 mg Q24H; Vital @ 50 ml/hr  Inpatient Diabetes Program Recommendations:    Insulin-Basal: If steroids are continued, please ordering Lantus 8 units Q24H (based on 84.2 kg x 0.1 units).  Insulin Tube Feeding Coverage: Please consider ordering Novolog 2 units Q4H for tube feeding coverage. If tube feeding is stopped or held then Novolog tube feeding coverage should also be stopped or held.  Thanks, Orlando Penner, RN, MSN, CDE Diabetes Coordinator Inpatient Diabetes Program [phone] (Team Pager from 8am to 5pm)

## 2019-08-15 NOTE — Consult Note (Addendum)
Consultation Note Date: 08/15/2019   Patient Name: Melanie Lewis  DOB: 04-21-1934  MRN: [id number]  Age / Sex: 85 y.o., female  PCP: Leonel Ramsay, MD Referring Physician: Flora Lipps, MD  Reason for Consultation: Establishing goals of care  HPI/Patient Profile: 85 yo with c/o lip swelling that started yesterday, worse today and tongue swelling. Pt taking Lisinopril but has been taking it for a long time. NO other new meds or changes.  Clinical Assessment and Goals of Care: Patient is resting in bed on the ventilator. She has two daughters at bedside. She has one son who is not present.   The daughters discuss that she was perfectly fine, and then a few hours later she was not. They were hopeful she could be quickly treated and sent home.  She and one of her daughters live together. Functionally, she is able to cook, clean,and drive. She does not use any assistive devices. She enjoys her independence. The daughters states she manages her financial affairs and do not see any signs of dementia.    We discussed her diagnoses, prognosis, GOC, EOL wishes disposition and options.  A detailed discussion was had today regarding advanced directives.  Concepts specific to code status, artifical feeding and hydration, IV antibiotics and rehospitalization were discussed.  The difference between an aggressive medical intervention path and a comfort care path was discussed.  Values and goals of care important to patient and family were attempted to be elicited.  Discussed limitations of medical interventions to prolong quality of life in some situations and discussed the concept of human mortality.  They state when she is medically optimized and extubated this time, she would not want the breathing tube replaced, and would want to focus on comfort if she is unable to sustain life without it. They state she  would not want a tracheostomy as she would not want to live that way. The daughters tell me she would not want chest compressions, shocks, or a breathing tube if her heart or breathing stops. They are unsure if she would want a feeding tube.      I completed a MOST form today and the signed original was placed in the chart. A photocopy was placed in the chart to be scanned into EMR. The patient's daughters outlined their wishes for the following treatment decisions:  Cardiopulmonary Resuscitation: Do Not Attempt Resuscitation (DNR/No CPR)  Medical Interventions: Limited Additional Interventions: Use medical treatment, IV fluids and cardiac monitoring as indicated, DO NOT USE intubation or mechanical ventilation. May consider use of less invasive airway support such as BiPAP or CPAP. Also provide comfort measures. Transfer to the hospital if indicated. Avoid intensive care.   Antibiotics: Antibiotics if indicated  IV Fluids: IV fluids if indicated  Feeding Tube: Feeding tube long-term if indicated      SUMMARY OF RECOMMENDATIONS   One way extubation only when medically optimized. DNR/DNI.  Continue care.   Prognosis:   Unable to determine      Primary Diagnoses: Present on  Admission: . Angioedema   I have reviewed the medical record, interviewed the patient and family, and examined the patient. The following aspects are pertinent.  Past Medical History:  Diagnosis Date  . CHF (congestive heart failure) ([place])   . Coronary artery disease   . Dementia ([place])   . Heart attack ([place])   . Hypertension   . hyperthyroid   . Hyperthyroidism   . MI (myocardial infarction) [facility])    Social History   Socioeconomic History  . Marital status: Widowed    Spouse name: Not on file  . Number of children: Not on file  . Years of education: Not on file  . Highest education level: Not on file  Occupational History  . Not on file  Tobacco Use  . Smoking status: Never Smoker  . Smokeless  tobacco: Never Used  Substance and Sexual Activity  . Alcohol use: Yes    Alcohol/week: 2.0 standard drinks    Types: 2 Shots of liquor per week    Comment: occasional  . Drug use: No  . Sexual activity: Not Currently  Other Topics Concern  . Not on file  Social History Narrative  . Not on file   Social Determinants of Health   Financial Resource Strain:   . Difficulty of Paying Living Expenses:   Food Insecurity:   . Worried About Charity fundraiser in the Last Year:   . Arboriculturist in the Last Year:   Transportation Needs:   . Film/video editor (Medical):   Marland Kitchen Lack of Transportation (Non-Medical):   Physical Activity:   . Days of Exercise per Week:   . Minutes of Exercise per Session:   Stress:   . Feeling of Stress :   Social Connections:   . Frequency of Communication with Friends and Family:   . Frequency of Social Gatherings with Friends and Family:   . Attends Religious Services:   . Active Member of Clubs or Organizations:   . Attends Archivist Meetings:   Marland Kitchen Marital Status:    Family History  Problem Relation Age of Onset  . Heart attack Sister   . Hypertension Sister   . Dementia Sister   . Heart attack Son   . Dementia Sister    Scheduled Meds: . chlorhexidine gluconate (MEDLINE KIT)  15 mL Mouth Rinse BID  . Chlorhexidine Gluconate Cloth  6 each Topical Daily  . docusate  100 mg Per Tube BID  . enoxaparin (LOVENOX) injection  40 mg Subcutaneous Q24H  . free water  30 mL Per Tube Q4H  . glycopyrrolate  0.1 mg Intravenous BID  . hydrALAZINE  25 mg Per Tube Q8H  . insulin aspart  0-20 Units Subcutaneous Q4H  . mouth rinse  15 mL Mouth Rinse 10 times per day  . multivitamin with minerals  1 tablet Per Tube Daily  . pantoprazole sodium  40 mg Per Tube Daily  . propranolol  20 mg Per Tube TID  . propylthiouracil  50 mg Per Tube Q8H  . sodium chloride flush  3 mL Intravenous Q12H  . thiamine  100 mg Per Tube Daily   Continuous  Infusions: . sodium chloride 250 mL (08/10/19 1547)  . sodium chloride Stopped (08/11/19 1601)  . feeding supplement (VITAL HIGH PROTEIN) Stopped (08/15/19 1100)  . methylPREDNISolone (SOLU-MEDROL) injection Stopped (08/14/19 1819)   PRN Meds:.sodium chloride, acetaminophen, fentaNYL, hydrALAZINE, ondansetron (ZOFRAN) IV, polyethylene glycol, sodium chloride flush Medications Prior to Admission:  Prior to Admission medications   Medication Sig Start Date End Date Taking? Authorizing Provider  amiodarone (PACERONE) 200 MG tablet Take 100 mg by mouth daily. 04/28/19  Yes [provider]  aspirin EC 81 MG tablet Take 81 mg by mouth daily.   Yes [provider]  atorvastatin (LIPITOR) 10 MG tablet Take 1 tablet (10 mg total) by mouth daily. 09/05/18  Yes Mikey College, NP  Biotin 1 MG CAPS Take by mouth.   Yes [provider]  Calcium Carb-Cholecalciferol (CALCIUM 1000 + D PO) Calcium 600 mg + vit d 800 iu- 1 tab daily   Yes [provider]  carvedilol (COREG) 12.5 MG tablet Take 12.5 mg by mouth 2 (two) times daily with a meal.   Yes [provider]  Cholecalciferol (VITAMIN D3) 1000 units CAPS Take by mouth.   Yes [provider]  cloNIDine (CATAPRES) 0.1 MG tablet Take by mouth 2 (two) times daily.  07/25/18 07/28/19 Yes [provider]  donepezil (ARICEPT) 10 MG tablet Take 1 tablet (10 mg total) by mouth at bedtime. 09/05/18  Yes Mikey College, NP  hydrALAZINE (APRESOLINE) 25 MG tablet Take 25 mg by mouth 2 (two) times daily. 06/24/19  Yes [provider]  lisinopril (ZESTRIL) 20 MG tablet TAKE 1 TABLET BY MOUTH EVERY DAY 01/20/19  Yes Karamalegos, Devonne Doughty, DO  memantine (NAMENDA) 5 MG tablet Take 5 mg by mouth 2 (two) times daily. 07/19/19  Yes [provider]  methimazole (TAPAZOLE) 5 MG tablet Take 0.5 tablets by mouth daily.  04/13/18  Yes [provider]  potassium chloride SA (K-DUR) 20 MEQ  tablet Take 1 tablet (20 mg total) by mouth every day Patient taking differently: Take 20 mEq by mouth 2 (two) times daily. Take 1 tablet (20 mg total) by mouth every day 09/05/18  Yes Mikey College, NP  sertraline (ZOLOFT) 25 MG tablet Take 1 tablet (25 mg total) by mouth daily. 09/05/18  Yes Mikey College, NP  sotalol (BETAPACE) 80 MG tablet Take 40 mg by mouth 2 (two) times daily.  08/25/18 08/25/19 Yes [provider]  torsemide (DEMADEX) 20 MG tablet Take 20 mg by mouth 2 (two) times daily.   Yes [provider]  Vitamin D, Ergocalciferol, (DRISDOL) 1.25 MG (50000 UNIT) CAPS capsule Take 50,000 Units by mouth once a week. 04/25/19  Yes [provider]   Allergies  Allergen Reactions  . Lisinopril Swelling   Review of Systems  Unable to perform ROS   Physical Exam Constitutional:      Comments: Asleep with vent in place.      Vital Signs: BP 106/67   Pulse (!) 113   Temp 97.6 F (36.4 C)   Resp (!) 35   Ht 5' 2.01" (1.575 m)   Wt 84.2 kg   LMP  (LMP Unknown)   SpO2 98%   BMI 33.94 kg/m  Pain Scale: CPOT   Pain Score: Asleep   SpO2: SpO2: 98 % O2 Device:SpO2: 98 % O2 Flow Rate: .   IO: Intake/output summary:   Intake/Output Summary (Last 24 hours) at 08/15/2019 1433 Last data filed at 08/15/2019 1200 Gross per 24 hour  Intake 2762.54 ml  Output 1285 ml  Net 1477.54 ml    LBM: Last BM Date: 08/15/19 Baseline Weight: Weight: 93 kg Most recent weight: Weight: 84.2 kg     Palliative Assessment/Data:      Time In: 1:30 Time Out: 2:20 Time  Total: 50 min Greater than 50%  of this time was spent counseling and coordinating care related to the above assessment and plan.  Signed by: Asencion Gowda, NP   Please contact Palliative Medicine Team phone at [phone] for questions and concerns.  For individual provider: See Shea Evans

## 2019-08-16 LAB — RENAL FUNCTION PANEL
Albumin: 2 g/dL — ABNORMAL LOW (ref 3.5–5.0)
Anion gap: 10 (ref 5–15)
BUN: 51 mg/dL — ABNORMAL HIGH (ref 8–23)
CO2: 14 mmol/L — ABNORMAL LOW (ref 22–32)
Calcium: 8.3 mg/dL — ABNORMAL LOW (ref 8.9–10.3)
Chloride: 113 mmol/L — ABNORMAL HIGH (ref 98–111)
Creatinine, Ser: 0.81 mg/dL (ref 0.44–1.00)
GFR calc Af Amer: >60 mL/min (ref >60)
GFR calc non Af Amer: >60 mL/min (ref >60)
Glucose, Bld: 159 mg/dL — ABNORMAL HIGH (ref 70–99)
Phosphorus: 3.6 mg/dL (ref 2.5–4.6)
Potassium: 5.3 mmol/L — ABNORMAL HIGH (ref 3.5–5.1)
Sodium: 137 mmol/L (ref 135–145)

## 2019-08-16 LAB — CBC WITH DIFFERENTIAL/PLATELET
Abs Immature Granulocytes: 0.31 10*3/uL — ABNORMAL HIGH (ref 0.00–0.07)
Basophils Absolute: 0 10*3/uL (ref 0.0–0.1)
Basophils Relative: 0 %
Eosinophils Absolute: 0 10*3/uL (ref 0.0–0.5)
Eosinophils Relative: 0 %
HCT: 34.6 % — ABNORMAL LOW (ref 36.0–46.0)
Hemoglobin: 12.2 g/dL (ref 12.0–15.0)
Immature Granulocytes: 2 %
Lymphocytes Relative: 6 %
Lymphs Abs: 0.9 10*3/uL (ref 0.7–4.0)
MCH: 29.8 pg (ref 26.0–34.0)
MCHC: 35.3 g/dL (ref 30.0–36.0)
MCV: 84.6 fL (ref 80.0–100.0)
Monocytes Absolute: 1 10*3/uL (ref 0.1–1.0)
Monocytes Relative: 7 %
Neutro Abs: 12.3 10*3/uL — ABNORMAL HIGH (ref 1.7–7.7)
Neutrophils Relative %: 85 %
Platelets: 163 10*3/uL (ref 150–400)
RBC: 4.09 MIL/uL (ref 3.87–5.11)
RDW: 14.4 % (ref 11.5–15.5)
WBC: 14.4 10*3/uL — ABNORMAL HIGH (ref 4.0–10.5)
nRBC: 0 % (ref 0.0–0.2)

## 2019-08-16 LAB — GLUCOSE, CAPILLARY
Glucose-Capillary: 123 mg/dL — ABNORMAL HIGH (ref 70–99)
Glucose-Capillary: 143 mg/dL — ABNORMAL HIGH (ref 70–99)
Glucose-Capillary: 156 mg/dL — ABNORMAL HIGH (ref 70–99)
Glucose-Capillary: 158 mg/dL — ABNORMAL HIGH (ref 70–99)
Glucose-Capillary: 160 mg/dL — ABNORMAL HIGH (ref 70–99)
Glucose-Capillary: 165 mg/dL — ABNORMAL HIGH (ref 70–99)

## 2019-08-16 LAB — MAGNESIUM: Magnesium: 2.7 mg/dL — ABNORMAL HIGH (ref 1.7–2.4)

## 2019-08-16 MED ORDER — METHYLPREDNISOLONE SODIUM SUCC 40 MG IJ SOLR
20.0000 mg | Freq: Two times a day (BID) | INTRAMUSCULAR | Status: DC
  Administered 2019-08-16 – 2019-08-18 (×6): 20 mg via INTRAVENOUS
  Filled 2019-08-16 (×6): qty 1

## 2019-08-16 MED ORDER — VITAL AF 1.2 CAL PO LIQD
1000.0000 mL | ORAL | Status: DC
  Administered 2019-08-16: 1000 mL

## 2019-08-16 MED ORDER — PROSOURCE TF PO LIQD
45.0000 mL | Freq: Every day | ORAL | Status: DC
  Administered 2019-08-16 – 2019-08-18 (×3): 45 mL
  Filled 2019-08-16: qty 45

## 2019-08-16 NOTE — Progress Notes (Signed)
Nutrition Follow-up  DOCUMENTATION CODES:   Obesity unspecified  INTERVENTION:  Initiate new goal TF regimen of Vital AF 1.2 Cal at 50 mL/hr (1200 mL goal daily volume) per tube + PROSource TF 45 mL once daily per tube. Provides 1480 kcal, 101 grams of protein, 972 mL H2O daily.  NUTRITION DIAGNOSIS:   Inadequate oral intake related to inability to eat (pt sedated and ventilated) as evidenced by NPO status.  Ongoing.  GOAL:   Provide needs based on ASPEN/SCCM guidelines  Progressing with resumption of tube feeds.  MONITOR:   Vent status, Labs, Weight trends, Skin, I & O's, TF tolerance  REASON FOR ASSESSMENT:   Ventilator    ASSESSMENT:   85 y/o female with h/o HTN, CHF, CAD, dementia, MI and hyperthyroidism who is admitted with angioedema requiring intubation.  Patient is currently intubated on ventilator support MV: 7.5 L/min Temp (24hrs), Avg:98.1 F (36.7 C), Min:97.7 F (36.5 C), Max:98.7 F (37.1 C)  Medications reviewed and include: Colace 100 mg BID, Novolog 0-20 units Q4hrs, Solu-medrol 20 mg Q12hrs IV, MVI daily, Protonix, thiamine 100 mg daily.  Labs reviewed: CBG 158-160, Potassium 5.3, Chloride 113, CO2 14, BUN 51, Magnesium 2.7.  I/O: 540 mL UOP yesterday (0.3 mL/kg/hr)  Weight trend: 84.2 kg on 8/3; -5.5 kg from 7/16; wt fluctuating during admission likely from fluid  Enteral Access: OGT placed 7/16; terminates in stomach per abdominal x-ray 8/1  TF regimen: Vital High Protein at 50 mL/hr + FWF 30 mL Q4hrs  Yesterday patient's TFs were held for possible extubation and remain on hold. Discussed with RN and MD. Plan is to resume tube feeds today if patient does not extubate.  Diet Order:   Diet Order            Diet NPO time specified  Diet effective now                EDUCATION NEEDS:   No education needs have been identified at this time  Skin:  Skin Assessment: Reviewed RN Assessment (MASD to groin)  Last BM:  08/16/2019 - small type  7 per rectal tube  Height:   Ht Readings from Last 1 Encounters:  08/02/19 5' 2.01" (1.575 m)   Weight:   Wt Readings from Last 1 Encounters:  08/15/19 84.2 kg   Ideal Body Weight:  50 kg  BMI:  Body mass index is 33.94 kg/m.  Estimated Nutritional Needs:   Kcal:  1441  Protein:  100 grams/day  Fluid:  1.5 L/day  Felix Pacini, MS, RD, LDN Pager number available on Amion

## 2019-08-16 NOTE — Progress Notes (Signed)
PHARMACY CONSULT NOTE  Pharmacy Consult for Electrolyte Monitoring and Replacement   Recent Labs: Potassium (mmol/L)  Date Value  08/16/2019 5.3 (H)  02/11/2014 4.3   Magnesium (mg/dL)  Date Value  34/19/3790 2.7 (H)  02/07/2014 1.8   Calcium (mg/dL)  Date Value  24/09/7351 8.3 (L)   Calcium, Total (mg/dL)  Date Value  29/92/4268 9.4   Albumin (g/dL)  Date Value  34/19/6222 2.0 (L)  02/11/2014 2.9 (L)   Phosphorus (mg/dL)  Date Value  97/98/9211 3.6   Sodium (mmol/L)  Date Value  08/16/2019 137  09/18/2014 142  02/11/2014 139     Assessment: 85 year old female admitted to the ICU with angioedema s/t ACE inhibitor use. Patient intubated 7/16. Angioedema improved, patient with encephalopathy and significant hyperthyroidism. Remains intubated, off sedation. Pharmacy to manage electrolytes.  Goal of Therapy:  Electrolytes WNL  Plan:  Potassium slightly elevated at 5.3. Continues to receive tube feeds, assessing daily for extubation. Electrolytes with morning labs.  Pricilla Riffle ,PharmD Clinical Pharmacist 08/16/2019 12:03 PM

## 2019-08-16 NOTE — Progress Notes (Signed)
Patient has been placed back on rate and tidal volume per vent due to increased wob.  Patient tolerated change well.

## 2019-08-16 NOTE — Progress Notes (Signed)
Received report from Christina, R.N.

## 2019-08-16 NOTE — Progress Notes (Signed)
CRITICAL CARE NOTE BRIEF PATIENT DESCRIPTION Severe ACUTE Hypoxic and Hypercapnic Respiratory Failurefrom severe angioedemaandsevere airway obstruction, with severe metabolic encephalopathy  SIGNIFICANT EVENTS 7/16 admitted to ICU for severe angioedema 7/17 severe angioedema, resp failure 7/18 severe angioedema, resp failure 7/19 severe andioedema, resp failure 7/20 severe angioedema, resp failure 08/03/19- Daughter [name] is at bedside, we discussed hospital course and care plan together. Questions answered. 08/04/19- patient is s/p FFP with significant improvement of severely swollen lips/tongue. 08/05/19 - patient has small cuff leak this am with overall improvement in a lingual and orofacial swelling however still has significant angioedema. 08/06/19-patient had additional improvement in angioedema. She had episode of AFrVR while family was present and they did say this happens to her occasionally, this was remedied with 2.5 lopressor x1 and po maintance metop 12.2bid. 7/26 cardiology consulted for afib, remains encephlopathic 7/27 remains encephalopathic 7/29 remains encephalopathic; MRI NEGATIVE for any acute intracranial abnormality 7/30: Consult Neurology for persistent Encephalopathy, thyroid function consistent with hyperthyroidism, query apathetic hyperthyroidism 8/01: Palliative care consult placed, need to establish goals of care.  May need tracheostomy. 8/2-8/4 remains encephalopathic   CC  Follow up resp failure  SUBJECTIVE Remains on vent Open eyes to stimuli Prognosis is guarded awaiting for patient to wake up Daughters at bedside   BP (!) 171/139   Pulse (!) 106   Temp 98.6 F (37 C) (Axillary)   Resp 15   Ht 5' 2.01" (1.575 m)   Wt 84.2 kg   LMP  (LMP Unknown)   SpO2 99%   BMI 33.94 kg/m    I/O last 3 completed shifts: In: 1888.9 [I.V.:994.9; NG/GT:890; IV Piggyback:4] Out: 9038 [Urine:990; Stool:200] Total I/O In: 3 [I.V.:3] Out: 350  [Urine:350]  SpO2: 99 % FiO2 (%): 21 %  Estimated body mass index is 33.94 kg/m as calculated from the following:   Height as of this encounter: 5' 2.01" (1.575 m).   Weight as of this encounter: 84.2 kg.  REVIEW OF SYSTEMS  PATIENT IS UNABLE TO PROVIDE COMPLETE REVIEW OF SYSTEM S DUE TO SEVERE CRITICAL ILLNESS AND ENCEPHALOPATHY    PHYSICAL EXAMINATION:  GENERAL:critically ill appearing, +resp distress HEAD: Normocephalic, atraumatic.  EYES: Pupils equal, round, reactive to light.  No scleral icterus.  MOUTH: Moist mucosal membrane. NECK: Supple. No thyromegaly. No nodules. No JVD.  PULMONARY: +rhonchi, +wheezing CARDIOVASCULAR: S1 and S2. Regular rate and rhythm. No murmurs, rubs, or gallops.  GASTROINTESTINAL: Soft, nontender, -distended. Positive bowel sounds.  MUSCULOSKELETAL: No swelling, clubbing, or edema.  NEUROLOGIC: obtunded SKIN:intact,warm,dry     Nutrition Status: Nutrition Problem: Inadequate oral intake Etiology: inability to eat (pt sedated and ventilated) Signs/Symptoms: NPO status Interventions: Tube feeding     Indwelling Urinary Catheter continued, requirement due to   Reason to continue Indwelling Urinary Catheter strict Intake/Output monitoring for hemodynamic instability         Ventilator continued, requirement due to severe respiratory failure   Ventilator Sedation RASS 0 to -2      ASSESSMENT AND PLAN SYNOPSIS Severe ACUTE Hypoxic and Hypercapnic Respiratory Failure in the setting of severe angioedema and severe airway obstruction Angioedema can be seen with autoimmune thyroiditis Patient has had amiodarone associated autoimmune thyroiditis  Severe ACUTE Hypoxic and Hypercapnic Respiratory Failure -continue Mechanical Ventilator support -continue Bronchodilator Therapy -Wean Fio2 and PEEP as tolerated -VAP/VENT bundle implementation -will perform SAT/SBT when respiratory parameters are met  ANGIOEDEMA MUCH  IMPROVED  Morbid obesity, possible OSA.   Will certainly impact respiratory mechanics, ventilator weaning  ACUTE KIDNEY INJURY/Renal Failure -continue Foley Catheter-assess need -Avoid nephrotoxic agents -Follow urine output, BMP -Ensure adequate renal perfusion, optimize oxygenation -Renal dose medications     NEUROLOGY Acute toxic metabolic encephalopathy -Avoid sedating medications as able -MRI brain 08/02/2019 NEGATIVE for any acute intracranial abnormality. -EEG with severe diffuse encephalopathy, nonspecific etiology.No seizures or epileptiform discharges were seen  encephalopathy associated with hyperthyroidism Intravenous  methylprednisolone    Hx of thyroiditis due to Amiodarone toxicity -On PTU -Continue Propanolol -Antithyroid antibodies pending   ENDO - will use ICU hypoglycemic\Hyperglycemia protocol if indicated     ELECTROLYTES -follow labs as needed -replace as needed -pharmacy consultation and following      DVT/GI PRX ordered and assessed TRANSFUSIONS AS NEEDED MONITOR FSBS I Assessed the need for Labs I Assessed the need for Foley I Assessed the need for Central Venous Line Family Discussion when available I Assessed the need for Mobilization I made an Assessment of medications to be adjusted accordingly Safety Risk assessment completed  CASE DISCUSSED IN MULTIDISCIPLINARY ROUNDS WITH ICU TEAM       Critical Care Time devoted to patient care services described in this note is 34 minutes.   Overall, patient is critically ill, prognosis is guarded.  Patient with Multiorgan failure and at high risk for cardiac arrest and death.    Corrin Parker, M.D.  [facility]  Medical Director [place] Director [facility] Cardio-Pulmonary Department

## 2019-08-17 DIAGNOSIS — Z515 Encounter for palliative care: Secondary | ICD-10-CM

## 2019-08-17 LAB — RENAL FUNCTION PANEL
Albumin: 2.1 g/dL — ABNORMAL LOW (ref 3.5–5.0)
Anion gap: 9 (ref 5–15)
BUN: 64 mg/dL — ABNORMAL HIGH (ref 8–23)
CO2: 15 mmol/L — ABNORMAL LOW (ref 22–32)
Calcium: 8.6 mg/dL — ABNORMAL LOW (ref 8.9–10.3)
Chloride: 112 mmol/L — ABNORMAL HIGH (ref 98–111)
Creatinine, Ser: 0.77 mg/dL (ref 0.44–1.00)
GFR calc Af Amer: >60 mL/min (ref >60)
GFR calc non Af Amer: >60 mL/min (ref >60)
Glucose, Bld: 237 mg/dL — ABNORMAL HIGH (ref 70–99)
Phosphorus: 2.8 mg/dL (ref 2.5–4.6)
Potassium: 4 mmol/L (ref 3.5–5.1)
Sodium: 136 mmol/L (ref 135–145)

## 2019-08-17 LAB — CBC WITH DIFFERENTIAL/PLATELET
Abs Immature Granulocytes: 0.06 10*3/uL (ref 0.00–0.07)
Basophils Absolute: 0 10*3/uL (ref 0.0–0.1)
Basophils Relative: 0 %
Eosinophils Absolute: 0 10*3/uL (ref 0.0–0.5)
Eosinophils Relative: 0 %
HCT: 38.1 % (ref 36.0–46.0)
Hemoglobin: 12.1 g/dL (ref 12.0–15.0)
Immature Granulocytes: 1 %
Lymphocytes Relative: 5 %
Lymphs Abs: 0.6 10*3/uL — ABNORMAL LOW (ref 0.7–4.0)
MCH: 28.5 pg (ref 26.0–34.0)
MCHC: 31.8 g/dL (ref 30.0–36.0)
MCV: 89.6 fL (ref 80.0–100.0)
Monocytes Absolute: 1 10*3/uL (ref 0.1–1.0)
Monocytes Relative: 9 %
Neutro Abs: 9.8 10*3/uL — ABNORMAL HIGH (ref 1.7–7.7)
Neutrophils Relative %: 85 %
Platelets: 190 10*3/uL (ref 150–400)
RBC: 4.25 MIL/uL (ref 3.87–5.11)
RDW: 14.7 % (ref 11.5–15.5)
WBC: 11.4 10*3/uL — ABNORMAL HIGH (ref 4.0–10.5)
nRBC: 0 % (ref 0.0–0.2)

## 2019-08-17 LAB — GLUCOSE, CAPILLARY
Glucose-Capillary: 189 mg/dL — ABNORMAL HIGH (ref 70–99)
Glucose-Capillary: 182 mg/dL — ABNORMAL HIGH (ref 70–99)
Glucose-Capillary: 152 mg/dL — ABNORMAL HIGH (ref 70–99)
Glucose-Capillary: 212 mg/dL — ABNORMAL HIGH (ref 70–99)
Glucose-Capillary: 171 mg/dL — ABNORMAL HIGH (ref 70–99)

## 2019-08-17 LAB — MAGNESIUM: Magnesium: 2.8 mg/dL — ABNORMAL HIGH (ref 1.7–2.4)

## 2019-08-17 MED ORDER — MODAFINIL 100 MG PO TABS
100.0000 mg | ORAL_TABLET | Freq: Every day | ORAL | Status: DC
  Administered 2019-08-17 – 2019-08-18 (×2): 100 mg via ORAL
  Filled 2019-08-17 (×2): qty 1

## 2019-08-17 NOTE — Progress Notes (Signed)
PHARMACY CONSULT NOTE  Pharmacy Consult for Electrolyte Monitoring and Replacement   Recent Labs: Potassium (mmol/L)  Date Value  08/17/2019 4.0  02/11/2014 4.3   Magnesium (mg/dL)  Date Value  50/93/2671 2.8 (H)  02/07/2014 1.8   Calcium (mg/dL)  Date Value  24/58/0998 8.6 (L)   Calcium, Total (mg/dL)  Date Value  33/82/5053 9.4   Albumin (g/dL)  Date Value  97/67/3419 2.1 (L)  02/11/2014 2.9 (L)   Phosphorus (mg/dL)  Date Value  37/90/2409 2.8   Sodium (mmol/L)  Date Value  08/17/2019 136  09/18/2014 142  02/11/2014 139     Assessment: 85 year old female admitted to the ICU with angioedema s/t ACE inhibitor use. Patient intubated 7/16. Angioedema improved, patient with encephalopathy and significant hyperthyroidism. Remains intubated, off sedation. Pharmacy to manage electrolytes.  Goal of Therapy:  Electrolytes WNL  Plan:  Potassium down to 4.0 today. Continues to receive tube feeds, assessing daily for extubation. Electrolytes with morning labs.  Pricilla Riffle ,PharmD Clinical Pharmacist 08/17/2019 10:57 AM

## 2019-08-17 NOTE — Progress Notes (Signed)
Daily Progress Note   Patient Name: Melanie Lewis       Date: 08/17/2019 DOB: 03/22/1934  Age: 85 y.o. MRN#: [id number] Attending Physician: Flora Lipps, MD Primary Care Physician: Leonel Ramsay, MD Admit Date: 07/28/2019  Reason for Consultation/Follow-up: Psychosocial/spiritual support  Subjective: Patient is resting in bed on ventilator. She is moving teeth intermittently on the ETT, but is not responsive.  No family at bedside. Plans for one way extubation when medically optimized.   Length of Stay: 20  Current Medications: Scheduled Meds:   chlorhexidine gluconate (MEDLINE KIT)  15 mL Mouth Rinse BID   Chlorhexidine Gluconate Cloth  6 each Topical Daily   docusate  100 mg Per Tube BID   enoxaparin (LOVENOX) injection  40 mg Subcutaneous Q24H   feeding supplement (PROSource TF)  45 mL Per Tube Daily   free water  30 mL Per Tube Q4H   glycopyrrolate  0.1 mg Intravenous BID   hydrALAZINE  25 mg Per Tube Q8H   insulin aspart  0-20 Units Subcutaneous Q4H   mouth rinse  15 mL Mouth Rinse 10 times per day   methylPREDNISolone (SOLU-MEDROL) injection  20 mg Intravenous Q12H   multivitamin with minerals  1 tablet Per Tube Daily   pantoprazole sodium  40 mg Per Tube Daily   propranolol  20 mg Per Tube TID   propylthiouracil  50 mg Per Tube Q8H   sodium chloride flush  3 mL Intravenous Q12H   thiamine  100 mg Per Tube Daily    Continuous Infusions:  sodium chloride 250 mL (08/10/19 1547)   sodium chloride Stopped (08/11/19 1601)   feeding supplement (VITAL AF 1.2 CAL) 1,000 mL (08/16/19 2030)    PRN Meds: sodium chloride, acetaminophen, fentaNYL, hydrALAZINE, ondansetron (ZOFRAN) IV, polyethylene glycol, sodium chloride flush  Physical  Exam Skin:    General: Skin is warm and dry.             Vital Signs: BP 119/72    Pulse (!) 101    Temp 99 F (37.2 C) (Oral)    Resp 17    Ht 5' 2.01" (1.575 m)    Wt 88.5 kg    LMP  (LMP Unknown)    SpO2 99%    BMI 35.68 kg/m  SpO2: SpO2: 99 % O2  Device: O2 Device: Ventilator O2 Flow Rate:    Intake/output summary:   Intake/Output Summary (Last 24 hours) at 08/17/2019 1056 Last data filed at 08/17/2019 1000 Gross per 24 hour  Intake 675 ml  Output 1525 ml  Net -850 ml   LBM: Last BM Date: (P) 08/17/19 Baseline Weight: Weight: 93 kg Most recent weight: Weight: 88.5 kg       Palliative Assessment/Data:      Patient Active Problem List   Diagnosis Date Noted   Angioedema 07/28/2019   Hyperthyroidism 04/20/2017   Coronary artery disease of native artery of native heart with stable angina pectoris ([place]) 04/20/2017   Pure hypercholesterolemia 04/20/2017   Dementia without behavioral disturbance ([place]) 04/20/2017   Osteopenia of lumbar spine 04/20/2017   History of non-ST elevation myocardial infarction (NSTEMI) 08/14/2014   Chronic diastolic heart failure ([place]) 06/29/2014   Paroxysmal supraventricular tachycardia ([place]) 06/23/2014   Essential hypertension 04/19/2014   Bradycardia 04/19/2014    Palliative Care Assessment & Plan   Recommendations/Plan: One way extubation when medically optimized.     Code Status:    Code Status Orders  (From admission, onward)         Start     Ordered   08/15/19 1450  Limited resuscitation (code)  Continuous       Question Answer Comment  In the event of cardiac or respiratory ARREST: Initiate Code Blue, Call Rapid Response Yes   In the event of cardiac or respiratory ARREST: Perform CPR No   In the event of cardiac or respiratory ARREST: Perform Intubation/Mechanical Ventilation No   In the event of cardiac or respiratory ARREST: Use NIPPV/BiPAp only if indicated Yes   In the event of cardiac or respiratory  ARREST: Administer ACLS medications if indicated Yes   In the event of cardiac or respiratory ARREST: Perform Defibrillation or Cardioversion if indicated No   Comments No defibrillation or CPR. No reintubation/trach following this extubation which should occur when medically optimized.      08/15/19 1451        Code Status History    Date Active Date Inactive Code Status Order ID Comments User Context   07/28/2019 1542 08/15/2019 1451 Full Code [id number]  Flora Lipps, MD ED   08/14/2014 1415 08/15/2014 1531 Full Code [id number]  Isaias Cowman, MD Inpatient   08/13/2014 2206 08/14/2014 1415 Full Code [id number]  Lytle Butte, MD ED   06/23/2014 0527 06/24/2014 1617 Full Code [id number]  Juluis Mire, MD Inpatient   Advance Care Planning Activity      Prognosis:  Poor overall    Thank you for allowing the Palliative Medicine Team to assist in the care of this patient.   Total Time 15 min Prolonged Time Billed  no      Greater than 50%  of this time was spent counseling and coordinating care related to the above assessment and plan.  Asencion Gowda, NP  Please contact Palliative Medicine Team phone at [phone] for questions and concerns.

## 2019-08-18 LAB — CBC WITH DIFFERENTIAL/PLATELET
Abs Immature Granulocytes: 0.07 10*3/uL (ref 0.00–0.07)
Basophils Absolute: 0 10*3/uL (ref 0.0–0.1)
Basophils Relative: 0 %
Eosinophils Absolute: 0 10*3/uL (ref 0.0–0.5)
Eosinophils Relative: 0 %
HCT: 38.8 % (ref 36.0–46.0)
Hemoglobin: 13.2 g/dL (ref 12.0–15.0)
Immature Granulocytes: 1 %
Lymphocytes Relative: 4 %
Lymphs Abs: 0.6 10*3/uL — ABNORMAL LOW (ref 0.7–4.0)
MCH: 29.7 pg (ref 26.0–34.0)
MCHC: 34 g/dL (ref 30.0–36.0)
MCV: 87.4 fL (ref 80.0–100.0)
Monocytes Absolute: 1 10*3/uL (ref 0.1–1.0)
Monocytes Relative: 7 %
Neutro Abs: 12.8 10*3/uL — ABNORMAL HIGH (ref 1.7–7.7)
Neutrophils Relative %: 88 %
Platelets: 210 10*3/uL (ref 150–400)
RBC: 4.44 MIL/uL (ref 3.87–5.11)
RDW: 14.9 % (ref 11.5–15.5)
WBC: 14.5 10*3/uL — ABNORMAL HIGH (ref 4.0–10.5)
nRBC: 0 % (ref 0.0–0.2)

## 2019-08-18 LAB — RENAL FUNCTION PANEL
Albumin: 2.2 g/dL — ABNORMAL LOW (ref 3.5–5.0)
Anion gap: 8 (ref 5–15)
BUN: 63 mg/dL — ABNORMAL HIGH (ref 8–23)
CO2: 16 mmol/L — ABNORMAL LOW (ref 22–32)
Calcium: 8.3 mg/dL — ABNORMAL LOW (ref 8.9–10.3)
Chloride: 114 mmol/L — ABNORMAL HIGH (ref 98–111)
Creatinine, Ser: 0.75 mg/dL (ref 0.44–1.00)
GFR calc Af Amer: >60 mL/min (ref >60)
GFR calc non Af Amer: >60 mL/min (ref >60)
Glucose, Bld: 226 mg/dL — ABNORMAL HIGH (ref 70–99)
Phosphorus: 2.5 mg/dL (ref 2.5–4.6)
Potassium: 4.5 mmol/L (ref 3.5–5.1)
Sodium: 138 mmol/L (ref 135–145)

## 2019-08-18 LAB — GLUCOSE, CAPILLARY
Glucose-Capillary: 120 mg/dL — ABNORMAL HIGH (ref 70–99)
Glucose-Capillary: 144 mg/dL — ABNORMAL HIGH (ref 70–99)
Glucose-Capillary: 171 mg/dL — ABNORMAL HIGH (ref 70–99)
Glucose-Capillary: 186 mg/dL — ABNORMAL HIGH (ref 70–99)
Glucose-Capillary: 198 mg/dL — ABNORMAL HIGH (ref 70–99)

## 2019-08-18 LAB — CREATININE, SERUM
Creatinine, Ser: 0.83 mg/dL (ref 0.44–1.00)
GFR calc Af Amer: >60 mL/min (ref >60)
GFR calc non Af Amer: >60 mL/min (ref >60)

## 2019-08-18 LAB — MAGNESIUM: Magnesium: 2.8 mg/dL — ABNORMAL HIGH (ref 1.7–2.4)

## 2019-08-18 NOTE — Progress Notes (Signed)
Tolerating extubation. Opens eyes to mouth care and pain. Does not try to talk or communicate. Remains on 2 L nasal canula od oxygen.

## 2019-08-18 NOTE — Progress Notes (Signed)
Patient was successfully extubated to 2l [place] with no complications.

## 2019-08-18 NOTE — Progress Notes (Signed)
°   08/18/19 1145  Clinical Encounter Type  Visited With Family  Visit Type Follow-up  Referral From Chaplain  Consult/Referral To Chaplain  While walking through ICU, chaplain saw patient's daughter standing outside the room. Staff was working on her mother. Chaplain mentioned that she heard, patient was responding to people and moving a little. Daughter acknowledged the goodness of God. Daughter said yes. She appears to be excited with her mother's progress. Chaplain will follow up with patient and family at a later time.

## 2019-08-18 NOTE — Progress Notes (Signed)
CRITICAL CARE PROGRESS NOTE    Name: Melanie Lewis MRN: [id number] DOB: 30-Nov-1934     LOS: 36   SUBJECTIVE FINDINGS & SIGNIFICANT EVENTS    Patient description:   85 yo F with PMH as below came in with chief complaint of difficulty articulating and SOB with swelling of face/lips and throat. She was intubated due to angioedema, this was thought to be due to lisinopril.   Marland Kitchen  SIGNIFICANT EVENTS 7/16 admitted to ICU for severe angioedema 7/17severe angioedema, resp failure 7/18 severe angioedema, resp failure 7/19 severe andioedema, resp failure 7/20 severe angioedema, resp failure 08/03/19- Daughter Melanie Lewis is at bedside, we discussed hospital course and care plan together. Questions answered. 08/04/19- patient is s/p FFP with significant improvement of severely swollen lips/tongue. 08/05/19 - patient has small cuff leak this am with overall improvement in a lingual and orofacial swelling however still has significant angioedema. 08/06/19-patient had additional improvement in angioedema. She had episode of AFrVR while family was present and they did say this happens to her occasionally, this was remedied with 2.5 lopressor x1 and po maintance metop 12.2bid. 7/26 cardiology consulted for afib, remains encephlopathic 7/27 remains encephalopathic 7/29 remains encephalopathic; MRI NEGATIVE for any acute intracranial abnormality 7/30:Consult Neurology for persistent Encephalopathy, thyroid function consistent with hyperthyroidism, query apathetic hyperthyroidism 8/01:Palliative care consult placed, need to establish goals of care. May need tracheostomy. 8/2-8/4 remains encephalopathic 8/6- patient remains with encephalopathy but she appears slightly more awake post modafinil addition. Spoke to daughter  today, patient is on SBT and doing well on 21% will liberate off MV if possible. Spoke to PAD daughter at length today, she wants trache.    Lines/tubes : Airway 7 mm (Active)  Secured at (cm) 24 cm 08/03/19 0804  Measured From Lips 08/03/19 [place] 08/03/19 0804  Secured By [employer] 08/03/19 0804  Tube Holder Repositioned Yes 08/03/19 0743  Cuff Pressure (cm H2O) 28 cm H2O 08/03/19 0743  Site Condition Cool;Dry;Edema 08/03/19 0743     NG/OG Tube Orogastric Right mouth Xray Documented cm marking at nare/ corner of mouth 60 cm (Active)  Cm Marking at Nare/Corner of Mouth (if applicable) 60 cm 32/12/24 0804  Site Assessment Clean;Dry;Intact 08/03/19 0804  Ongoing Placement Verification No change in cm markings or external length of tube from initial placement;No change in respiratory status 08/03/19 0804  Status Infusing tube feed 08/03/19 0804  Drainage Appearance None 08/02/19 2000  Intake (mL) 30 mL 07/31/19 0658  Output (mL) 0 mL 08/02/19 1557     Urethral Catheter Dee RN  Temperature probe 14 Fr. (Active)  Indication for Insertion or Continuance of Catheter Therapy based on hourly urine output monitoring and documentation for critical condition (NOT STRICT I&O) 08/03/19 0723  Site Assessment Clean;Intact 08/03/19 0723  Catheter Maintenance Bag below level of bladder;Catheter secured;Drainage bag/tubing not touching floor;Insertion date on drainage bag;No dependent loops;Seal intact;Bag emptied prior to transport 08/03/19 0723  Collection Container Standard drainage bag 08/03/19 0723  Securement Method Securing device (Describe) 08/03/19 0723  Urinary Catheter Interventions (if applicable) Unclamped 82/50/03 0723  Output (mL) 175 mL 08/03/19 0436    Microbiology/Sepsis markers: Results for orders placed or performed during the hospital encounter of 07/28/19  SARS Coronavirus 2 by RT PCR (hospital order, performed in [facility] hospital lab)  Nasopharyngeal Nasopharyngeal Swab     Status: None   Collection Time: 07/28/19  3:43 PM   Specimen: Nasopharyngeal Swab  Result Value Ref Range Status  SARS Coronavirus 2 NEGATIVE NEGATIVE Final    Comment: (NOTE) SARS-CoV-2 target nucleic acids are NOT DETECTED.  The SARS-CoV-2 RNA is generally detectable in upper and lower respiratory specimens during the acute phase of infection. The lowest concentration of SARS-CoV-2 viral copies this assay can detect is 250 copies / mL. A negative result does not preclude SARS-CoV-2 infection and should not be used as the sole basis for treatment or other patient management decisions.  A negative result may occur with improper specimen collection / handling, submission of specimen other than nasopharyngeal swab, presence of viral mutation(s) within the areas targeted by this assay, and inadequate number of viral copies (<250 copies / mL). A negative result must be combined with clinical observations, patient history, and epidemiological information.  Fact Sheet for Patients:   StrictlyIdeas.no  Fact Sheet for Healthcare Providers: BankingDealers.co.za  This test is not yet approved or  cleared by the [place] FDA and has been authorized for detection and/or diagnosis of SARS-CoV-2 by FDA under an Emergency Use Authorization (EUA).  This EUA will remain in effect (meaning this test can be used) for the duration of the COVID-19 declaration under Section 564(b)(1) of the Act, 21 U.S.C. section 360bbb-3(b)(1), unless the authorization is terminated or revoked sooner.  Performed at [facility], [address] [place], [place] [address]   MRSA PCR Screening     Status: None   Collection Time: 07/28/19  5:46 PM   Specimen: Nasal Mucosa; Nasopharyngeal  Result Value Ref Range Status   MRSA by PCR NEGATIVE NEGATIVE Final    Comment:        The GeneXpert MRSA Assay (FDA approved  for NASAL specimens only), is one component of a comprehensive MRSA colonization surveillance program. It is not intended to diagnose MRSA infection nor to guide or monitor treatment for MRSA infections. Performed at [facility], [address] [place],  [address]     Anti-infectives:  Anti-infectives (From admission, onward)   Start     Dose/Rate Route Frequency Ordered Stop   08/02/19 1200  Ampicillin-Sulbactam (UNASYN) 3 g in sodium chloride 0.9 % 100 mL IVPB  Status:  Discontinued        3 g 200 mL/hr over 30 Minutes Intravenous Every 6 hours 08/02/19 1014 08/07/19 1003        Tests / Events: C1esterase deficiency    PAST MEDICAL HISTORY   Past Medical History:  Diagnosis Date  . CHF (congestive heart failure) ([place])   . Coronary artery disease   . Dementia ([place])   . Heart attack ([place])   . Hypertension   . hyperthyroid   . Hyperthyroidism   . MI (myocardial infarction) [facility])      SURGICAL HISTORY   Past Surgical History:  Procedure Laterality Date  . CARDIAC CATHETERIZATION N/A 08/14/2014   Procedure: Left Heart Cath and Coronary Angiography;  Surgeon: Isaias Cowman, MD;  Location: [facility];  Service: Cardiovascular;  Laterality: N/A;  . CHOLECYSTECTOMY    . PARTIAL HYSTERECTOMY       FAMILY HISTORY   Family History  Problem Relation Age of Onset  . Heart attack Sister   . Hypertension Sister   . Dementia Sister   . Heart attack Son   . Dementia Sister      SOCIAL HISTORY   Social History   Tobacco Use  . Smoking status: Never Smoker  . Smokeless tobacco: Never Used  Substance Use Topics  . Alcohol use:  Yes    Alcohol/week: 2.0 standard drinks    Types: 2 Shots of liquor per week    Comment: occasional  . Drug use: No     MEDICATIONS   Current Medication:  Current Facility-Administered Medications:  .  0.9 %  sodium chloride infusion, 250 mL, Intravenous, PRN, Flora Lipps, MD, Last Rate: 5  mL/hr at 08/10/19 1547, 250 mL at 08/10/19 1547 .  0.9 %  sodium chloride infusion, , Intravenous, Continuous, Ottie Glazier, MD, Stopped at 08/11/19 1601 .  acetaminophen (TYLENOL) tablet 650 mg, 650 mg, Oral, Q4H PRN, Mortimer Fries, Kurian, MD .  chlorhexidine gluconate (MEDLINE KIT) (PERIDEX) 0.12 % solution 15 mL, 15 mL, Mouth Rinse, BID, Kasa, Kurian, MD, 15 mL at 08/18/19 0800 .  Chlorhexidine Gluconate Cloth 2 % PADS 6 each, 6 each, Topical, Daily, Tukov-Yual, Magdalene S, NP, 6 each at 08/18/19 0541 .  docusate (COLACE) 50 MG/5ML liquid 100 mg, 100 mg, Per Tube, BID, Flora Lipps, MD, 100 mg at 08/17/19 2152 .  enoxaparin (LOVENOX) injection 40 mg, 40 mg, Subcutaneous, Q24H, Kasa, Kurian, MD, 40 mg at 08/17/19 0904 .  feeding supplement (PROSource TF) liquid 45 mL, 45 mL, Per Tube, Daily, Flora Lipps, MD, 45 mL at 08/17/19 0859 .  feeding supplement (VITAL AF 1.2 CAL) liquid 1,000 mL, 1,000 mL, Per Tube, Continuous, Kasa, Kurian, MD, Last Rate: 50 mL/hr at 08/17/19 1300, Rate Verify at 08/17/19 1300 .  fentaNYL (SUBLIMAZE) bolus via infusion 25 mcg, 25 mcg, Intravenous, Q1H PRN, Duffy Bruce, MD, 25 mcg at 07/31/19 0440 .  free water 30 mL, 30 mL, Per Tube, Q4H, Kasa, Kurian, MD, 30 mL at 08/18/19 0800 .  glycopyrrolate (ROBINUL) injection 0.1 mg, 0.1 mg, Intravenous, BID, Kasa, Kurian, MD, 0.1 mg at 08/17/19 2154 .  hydrALAZINE (APRESOLINE) injection 10 mg, 10 mg, Intravenous, Q4H PRN, Tukov-Yual, Magdalene S, NP, 10 mg at 08/18/19 7711 .  hydrALAZINE (APRESOLINE) tablet 25 mg, 25 mg, Per Tube, Q8H, Flora Lipps, MD, 25 mg at 08/18/19 0538 .  insulin aspart (novoLOG) injection 0-20 Units, 0-20 Units, Subcutaneous, Q4H, Flora Lipps, MD, 4 Units at 08/18/19 0800 .  MEDLINE mouth rinse, 15 mL, Mouth Rinse, 10 times per day, Flora Lipps, MD, 15 mL at 08/18/19 0541 .  methylPREDNISolone sodium succinate (SOLU-MEDROL) 40 mg/mL injection 20 mg, 20 mg, Intravenous, Q12H, Kasa, Kurian, MD, 20 mg at  08/18/19 0002 .  modafinil (PROVIGIL) tablet 100 mg, 100 mg, Oral, Daily, Kasa, Kurian, MD, 100 mg at 08/17/19 1340 .  multivitamin with minerals tablet 1 tablet, 1 tablet, Per Tube, Daily, Ottie Glazier, MD, 1 tablet at 08/17/19 0856 .  ondansetron (ZOFRAN) injection 4 mg, 4 mg, Intravenous, Q6H PRN, Flora Lipps, MD, 4 mg at 08/13/19 1736 .  pantoprazole sodium (PROTONIX) 40 mg/20 mL oral suspension 40 mg, 40 mg, Per Tube, Daily, Tyler Pita, MD, 40 mg at 08/17/19 0856 .  polyethylene glycol (MIRALAX / GLYCOLAX) packet 17 g, 17 g, Oral, Daily PRN, Flora Lipps, MD .  propranolol (INDERAL) tablet 20 mg, 20 mg, Per Tube, TID, Flora Lipps, MD, 20 mg at 08/17/19 2154 .  [COMPLETED] propylthiouracil (PTU) tablet 100 mg, 100 mg, Per Tube, Q8H, 100 mg at 08/14/19 0627 **FOLLOWED BY** propylthiouracil (PTU) tablet 50 mg, 50 mg, Per Tube, Q8H, Tyler Pita, MD, 50 mg at 08/18/19 0539 .  sodium chloride flush (NS) 0.9 % injection 3 mL, 3 mL, Intravenous, Q12H, Kasa, Kurian, MD, 3 mL at 08/17/19 2156 .  sodium  chloride flush (NS) 0.9 % injection 3 mL, 3 mL, Intravenous, PRN, Flora Lipps, MD .  thiamine tablet 100 mg, 100 mg, Per Tube, Daily, Tyler Pita, MD, 100 mg at 08/17/19 0857    ALLERGIES   Lisinopril    REVIEW OF SYSTEMS     Unable to obtain due to sedation with MV  PHYSICAL EXAMINATION   Vital Signs: Temp:  [98.1 F (36.7 C)-99.2 F (37.3 C)] 98.1 F (36.7 C) (08/06 0400) Pulse Rate:  [33-131] 33 (08/06 0600) Resp:  [17-23] 19 (08/06 0600) BP: (117-190)/(61-104) 147/61 (08/06 0600) SpO2:  [98 %-100 %] 100 % (08/06 0600) FiO2 (%):  [21 %] 21 % (08/06 0911) Weight:  [87.5 kg] 87.5 kg (08/06 0500)  GENERAL:age appropriate on MV HEAD: Normocephalic, atraumatic.  EYES: Pupils equal, round, reactive to light.  No scleral icterus. Periorbital swelling+ MOUTH: Moist mucosal membrane. Severely swollen lips and protrusion of swollen tongue NECK: Supple. No  thyromegaly. No nodules. No JVD.  PULMONARY: mildly rhonchorous breath sounds with MV in background CARDIOVASCULAR: S1 and S2. Regular rate and rhythm. No murmurs, rubs, or gallops.  GASTROINTESTINAL: Soft, nontender, non-distended. No masses. Positive bowel sounds. No hepatosplenomegaly.  MUSCULOSKELETAL: No swelling, clubbing, or edema.  NEUROLOGIC: GCS4T SKIN:intact,warm,dry   PERTINENT DATA     Infusions: . sodium chloride 250 mL (08/10/19 1547)  . sodium chloride Stopped (08/11/19 1601)  . feeding supplement (VITAL AF 1.2 CAL) 50 mL/hr at 08/17/19 1300   Scheduled Medications: . chlorhexidine gluconate (MEDLINE KIT)  15 mL Mouth Rinse BID  . Chlorhexidine Gluconate Cloth  6 each Topical Daily  . docusate  100 mg Per Tube BID  . enoxaparin (LOVENOX) injection  40 mg Subcutaneous Q24H  . feeding supplement (PROSource TF)  45 mL Per Tube Daily  . free water  30 mL Per Tube Q4H  . glycopyrrolate  0.1 mg Intravenous BID  . hydrALAZINE  25 mg Per Tube Q8H  . insulin aspart  0-20 Units Subcutaneous Q4H  . mouth rinse  15 mL Mouth Rinse 10 times per day  . methylPREDNISolone (SOLU-MEDROL) injection  20 mg Intravenous Q12H  . modafinil  100 mg Oral Daily  . multivitamin with minerals  1 tablet Per Tube Daily  . pantoprazole sodium  40 mg Per Tube Daily  . propranolol  20 mg Per Tube TID  . propylthiouracil  50 mg Per Tube Q8H  . sodium chloride flush  3 mL Intravenous Q12H  . thiamine  100 mg Per Tube Daily   PRN Medications: sodium chloride, acetaminophen, fentaNYL, hydrALAZINE, ondansetron (ZOFRAN) IV, polyethylene glycol, sodium chloride flush Hemodynamic parameters:   Intake/Output: 08/05 0701 - 08/06 0700 In: 1100.8 [NG/GT:1100.8] Out: 1025 [Urine:325; Emesis/NG output:100; Stool:600]  Ventilator  Settings: Vent Mode: Spontaneous FiO2 (%):  [21 %] 21 % Set Rate:  [15 bmp] 15 bmp Vt Set:  [500 mL] 500 mL PEEP:  [5 cmH20] 5 cmH20 Pressure Support:  [8 cmH20] 8  cmH20 Plateau Pressure:  [15 cmH20-17 cmH20] 15 cmH20    LAB RESULTS:  Basic Metabolic Panel: Recent Labs  Lab 08/14/19 0510 08/14/19 0510 08/15/19 0138 08/15/19 0138 08/16/19 0437 08/16/19 0437 08/16/19 0619 08/17/19 0647 08/18/19 0503  NA 133*  --  134*  --  137  --   --  136 138  K 3.0*   < > 4.2   < > 5.3*   < >  --  4.0 4.5  CL 107  --  111  --  113*  --   --  112* 114*  CO2 16*  --  17*  --  14*  --   --  15* 16*  GLUCOSE 163*  --  248*  --  159*  --   --  237* 226*  BUN 44*  --  50*  --  51*  --   --  64* 63*  CREATININE 0.68  --  0.83  --  0.81  --   --  0.77 0.75  0.83  CALCIUM 8.3*  --  8.1*  --  8.3*  --   --  8.6* 8.3*  MG 2.7*  --  2.5*  --   --   --  2.7* 2.8* 2.8*  PHOS 3.6  --  2.7  --  3.6  --   --  2.8 2.5   < > = values in this interval not displayed.   Liver Function Tests: Recent Labs  Lab 08/14/19 0510 08/15/19 0138 08/16/19 0437 08/17/19 0647 08/18/19 0503  ALBUMIN 2.3* 2.0* 2.0* 2.1* 2.2*   No results for input(s): LIPASE, AMYLASE in the last 168 hours. Recent Labs  Lab 08/11/19 1025  AMMONIA 29   CBC: Recent Labs  Lab 08/14/19 0510 08/15/19 0138 08/16/19 1019 08/17/19 0647 08/18/19 0503  WBC 15.9* 13.1* 14.4* 11.4* 14.5*  NEUTROABS 14.4* 11.9* 12.3* 9.8* 12.8*  HGB 12.8 11.0* 12.2 12.1 13.2  HCT 38.3 33.4* 34.6* 38.1 38.8  MCV 87.4 89.1 84.6 89.6 87.4  PLT 126* 135* 163 190 210   Cardiac Enzymes: No results for input(s): CKTOTAL, CKMB, CKMBINDEX, TROPONINI in the last 168 hours. BNP: Invalid input(s): POCBNP CBG: Recent Labs  Lab 08/17/19 1614 08/17/19 2007 08/17/19 2359 08/18/19 0319 08/18/19 0732  GLUCAP 212* 171* 144* 186* 171*        ASSESSMENT AND PLAN    -Multidisciplinary rounds held today   Encephalopathy  -toxic metabolic vs CVA vs sedation recirculation   -neurology on case - appreciate input  - spoke to daughter today - plan to try to extubate since patient is breathing on her own with  21%fio2 with 100%spO2.  We discussed LTAC and patient is agreeable.  Daughter asked if trache is to be done, she is speaking with palliative and agreed to [facility] but wants trache. I have proposed we attempt to liberate patient of MV prior to trahce placement to allow her a chance to breathe naturally.    Acute angioedema-resolved  - presumably due to ACE inhibitor  -completed Unasyn   Atrial fibrillation with rapid ventricular response   - metoprolol 12.5   - prn metoprolol IV q6h -oxygen as needed ICU telemetry monitoring  Accelerated hypertension    -HCTZ 50 mg with mild improvement advanced to 100 mg p.o. daily still with accelerated hypertension further improved, adding Norvasc 5 mg p.o. daily to her regimen. -d/c clonidine due to recurrent rebound episodes of hypertensive urgency  GI/Nutrition GI PROPHYLAXIS as indicated-Orogastric tube feeding intiated - discussed with Jacklynn Barnacle RD DIET-->TF's as tolerated Constipation protocol as indicated  ENDO - ICU hypoglycemic\Hyperglycemia protocol -check FSBS per protocol   ELECTROLYTES -follow labs as needed -replace as needed -pharmacy consultation   DVT/GI PRX ordered -SCDs  TRANSFUSIONS AS NEEDED MONITOR FSBS ASSESS the need for LABS as needed   Critical care provider statement:    Critical care time (minutes):  109   Critical care time was exclusive of:  Separately billable procedures and treating other patients   Critical care was necessary  to treat or prevent imminent or life-threatening deterioration of the following conditions:  angioedema, accelerated HTN,    Critical care was time spent personally by me on the following activities:  Development of treatment plan with patient or surrogate, discussions with consultants, evaluation of patient's response to treatment, examination of patient, obtaining history from patient or surrogate, ordering and performing treatments and interventions, ordering and review of  laboratory studies and re-evaluation of patient's condition.  I assumed direction of critical care for this patient from another provider in my specialty: no    This document was prepared using Dragon voice recognition software and may include unintentional dictation errors.    Ottie Glazier, M.D.  Division of [place]

## 2019-08-18 NOTE — Progress Notes (Signed)
PHARMACY CONSULT NOTE  Pharmacy Consult for Electrolyte Monitoring and Replacement   Recent Labs: Potassium (mmol/L)  Date Value  08/18/2019 4.5  02/11/2014 4.3   Magnesium (mg/dL)  Date Value  29/51/8841 2.8 (H)  02/07/2014 1.8   Calcium (mg/dL)  Date Value  66/06/3014 8.3 (L)   Calcium, Total (mg/dL)  Date Value  01/20/3233 9.4   Albumin (g/dL)  Date Value  57/32/2025 2.2 (L)  02/11/2014 2.9 (L)   Phosphorus (mg/dL)  Date Value  42/70/6237 2.5   Sodium (mmol/L)  Date Value  08/18/2019 138  09/18/2014 142  02/11/2014 139     Assessment: 85 year old female admitted to the ICU with angioedema s/t ACE inhibitor use. Patient intubated 7/16. Angioedema improved, patient with encephalopathy and significant hyperthyroidism. Remains intubated, off sedation. Pharmacy to manage electrolytes.  Goal of Therapy:  Electrolytes WNL  Plan:  Electrolytes stable. Continues to receive tube feeds, assessing daily for extubation. Electrolytes with morning labs.  Pricilla Riffle ,PharmD Clinical Pharmacist 08/18/2019 11:44 AM

## 2019-08-18 NOTE — Progress Notes (Signed)
                                                                                                                                                                                                         Daily Progress Note   Patient Name: Melanie Lewis       Date: 08/18/2019 DOB: 02/22/1934  Age: 85 y.o. MRN#: [id number] Attending Physician: Kasa, Kurian, MD Primary Care Physician: Fitzgerald, David P, MD Admit Date: 07/28/2019  Reason for Consultation/Follow-up: Psychosocial/spiritual support  Subjective: Patient is resting in bed on ventilator. No family at bedside. Current plans are to move patient to LTACH. Recommend palliative to follow there for further GOC conversation.    Length of Stay: 21  Current Medications: Scheduled Meds:  . chlorhexidine gluconate (MEDLINE KIT)  15 mL Mouth Rinse BID  . Chlorhexidine Gluconate Cloth  6 each Topical Daily  . docusate  100 mg Per Tube BID  . enoxaparin (LOVENOX) injection  40 mg Subcutaneous Q24H  . feeding supplement (PROSource TF)  45 mL Per Tube Daily  . free water  30 mL Per Tube Q4H  . glycopyrrolate  0.1 mg Intravenous BID  . hydrALAZINE  25 mg Per Tube Q8H  . insulin aspart  0-20 Units Subcutaneous Q4H  . mouth rinse  15 mL Mouth Rinse 10 times per day  . methylPREDNISolone (SOLU-MEDROL) injection  20 mg Intravenous Q12H  . modafinil  100 mg Oral Daily  . multivitamin with minerals  1 tablet Per Tube Daily  . pantoprazole sodium  40 mg Per Tube Daily  . propranolol  20 mg Per Tube TID  . propylthiouracil  50 mg Per Tube Q8H  . sodium chloride flush  3 mL Intravenous Q12H  . thiamine  100 mg Per Tube Daily    Continuous Infusions: . sodium chloride 250 mL (08/10/19 1547)  . sodium chloride Stopped (08/11/19 1601)  . feeding supplement (VITAL AF 1.2 CAL) 50 mL/hr at 08/17/19 1300    PRN Meds: sodium chloride, acetaminophen, fentaNYL, hydrALAZINE, ondansetron (ZOFRAN) IV, polyethylene glycol, sodium chloride  flush  Physical Exam Skin:    General: Skin is warm and dry.             Vital Signs: BP (!) 152/67   Pulse (!) 117   Temp 98.1 F (36.7 C) (Oral)   Resp 19   Ht 5' 2.01" (1.575 m)   Wt 87.5 kg   LMP  (LMP Unknown)   SpO2 100%   BMI 35.27 kg/m  SpO2: SpO2: 100 % O2 Device:   O2 Device: Ventilator O2 Flow Rate: O2 Flow Rate (L/min): 21 L/min  Intake/output summary:   Intake/Output Summary (Last 24 hours) at 08/18/2019 1325 Last data filed at 08/18/2019 1000 Gross per 24 hour  Intake 175.83 ml  Output 700 ml  Net -524.17 ml   LBM: Last BM Date: 08/17/19 Baseline Weight: Weight: 93 kg Most recent weight: Weight: 87.5 kg       Palliative Assessment/Data:      Patient Active Problem List   Diagnosis Date Noted  . Angioedema 07/28/2019  . Hyperthyroidism 04/20/2017  . Coronary artery disease of native artery of native heart with stable angina pectoris ([place]) 04/20/2017  . Pure hypercholesterolemia 04/20/2017  . Dementia without behavioral disturbance ([place]) 04/20/2017  . Osteopenia of lumbar spine 04/20/2017  . History of non-ST elevation myocardial infarction (NSTEMI) 08/14/2014  . Chronic diastolic heart failure ([place]) 06/29/2014  . Paroxysmal supraventricular tachycardia ([place]) 06/23/2014  . Essential hypertension 04/19/2014  . Bradycardia 04/19/2014    Palliative Care Assessment & Plan   Recommendations/Plan: Plans for patient to move to [facility].  Recommend palliative to follow at D/C.     Code Status:    Code Status Orders  (From admission, onward)         Start     Ordered   08/15/19 1450  Limited resuscitation (code)  Continuous       Question Answer Comment  In the event of cardiac or respiratory ARREST: Initiate Code Blue, Call Rapid Response Yes   In the event of cardiac or respiratory ARREST: Perform CPR No   In the event of cardiac or respiratory ARREST: Perform Intubation/Mechanical Ventilation No   In the event of cardiac or respiratory  ARREST: Use NIPPV/BiPAp only if indicated Yes   In the event of cardiac or respiratory ARREST: Administer ACLS medications if indicated Yes   In the event of cardiac or respiratory ARREST: Perform Defibrillation or Cardioversion if indicated No   Comments No defibrillation or CPR. No reintubation/trach following this extubation which should occur when medically optimized.      08/15/19 1451        Code Status History    Date Active Date Inactive Code Status Order ID Comments User Context   07/28/2019 1542 08/15/2019 1451 Full Code [id number]  Flora Lipps, MD ED   08/14/2014 1415 08/15/2014 1531 Full Code [id number]  Isaias Cowman, MD Inpatient   08/13/2014 2206 08/14/2014 1415 Full Code [id number]  Lytle [place], MD ED   06/23/2014 0527 06/24/2014 1617 Full Code [id number]  Juluis Mire, MD Inpatient   Advance Care Planning Activity      Prognosis:  Poor overall    Thank you for allowing the Palliative Medicine Team to assist in the care of this patient.   Total Time 15 min Prolonged Time Billed  no      Greater than 50%  of this time was spent counseling and coordinating care related to the above assessment and plan.  Asencion Gowda, NP  Please contact Palliative Medicine Team phone at [phone] for questions and concerns.

## 2019-08-18 NOTE — Progress Notes (Signed)
1440 Patient extubated. Still groggy. No speech. Opened eyes only. Placed on 2 L nasal cannula.

## 2019-08-18 NOTE — TOC Progression Note (Signed)
Transition of Care [facility]) - Progression Note    Patient Details  Name: Melanie Lewis MRN: [id number] Date of Birth: [birth year]  Transition of Care [facility]) CM/SW Kirkpatrick, LCSW Phone Number: 08/18/2019, 12:00 PM  Clinical Narrative: MD has spoken with daughter about LTACH. CSW met with her and confirmed. Discussed [facility] and [facility]. Referral given to both representatives to review.    Expected Discharge Plan and Services                                                 Social Determinants of Health (SDOH) Interventions    Readmission Risk Interventions No flowsheet data found.

## 2019-08-19 LAB — RENAL FUNCTION PANEL
Albumin: 1.9 g/dL — ABNORMAL LOW (ref 3.5–5.0)
Anion gap: 10 (ref 5–15)
BUN: 52 mg/dL — ABNORMAL HIGH (ref 8–23)
CO2: 16 mmol/L — ABNORMAL LOW (ref 22–32)
Calcium: 8.1 mg/dL — ABNORMAL LOW (ref 8.9–10.3)
Chloride: 115 mmol/L — ABNORMAL HIGH (ref 98–111)
Creatinine, Ser: 0.68 mg/dL (ref 0.44–1.00)
GFR calc Af Amer: >60 mL/min (ref >60)
GFR calc non Af Amer: >60 mL/min (ref >60)
Glucose, Bld: 121 mg/dL — ABNORMAL HIGH (ref 70–99)
Phosphorus: 3.1 mg/dL (ref 2.5–4.6)
Potassium: 5.2 mmol/L — ABNORMAL HIGH (ref 3.5–5.1)
Sodium: 141 mmol/L (ref 135–145)

## 2019-08-19 LAB — CBC WITH DIFFERENTIAL/PLATELET
Abs Immature Granulocytes: 0.08 10*3/uL — ABNORMAL HIGH (ref 0.00–0.07)
Basophils Absolute: 0 10*3/uL (ref 0.0–0.1)
Basophils Relative: 0 %
Eosinophils Absolute: 0 10*3/uL (ref 0.0–0.5)
Eosinophils Relative: 0 %
HCT: 39.1 % (ref 36.0–46.0)
Hemoglobin: 11.8 g/dL — ABNORMAL LOW (ref 12.0–15.0)
Immature Granulocytes: 1 %
Lymphocytes Relative: 12 %
Lymphs Abs: 1.4 10*3/uL (ref 0.7–4.0)
MCH: 29.2 pg (ref 26.0–34.0)
MCHC: 30.2 g/dL (ref 30.0–36.0)
MCV: 96.8 fL (ref 80.0–100.0)
Monocytes Absolute: 0.9 10*3/uL (ref 0.1–1.0)
Monocytes Relative: 7 %
Neutro Abs: 10.1 10*3/uL — ABNORMAL HIGH (ref 1.7–7.7)
Neutrophils Relative %: 80 %
Platelets: 119 10*3/uL — ABNORMAL LOW (ref 150–400)
RBC: 4.04 MIL/uL (ref 3.87–5.11)
RDW: 15.2 % (ref 11.5–15.5)
Smear Review: NORMAL
WBC: 12.5 10*3/uL — ABNORMAL HIGH (ref 4.0–10.5)
nRBC: 0 % (ref 0.0–0.2)

## 2019-08-19 LAB — GLUCOSE, CAPILLARY
Glucose-Capillary: 107 mg/dL — ABNORMAL HIGH (ref 70–99)
Glucose-Capillary: 109 mg/dL — ABNORMAL HIGH (ref 70–99)
Glucose-Capillary: 122 mg/dL — ABNORMAL HIGH (ref 70–99)
Glucose-Capillary: 136 mg/dL — ABNORMAL HIGH (ref 70–99)
Glucose-Capillary: 148 mg/dL — ABNORMAL HIGH (ref 70–99)
Glucose-Capillary: 55 mg/dL — ABNORMAL LOW (ref 70–99)
Glucose-Capillary: 65 mg/dL — ABNORMAL LOW (ref 70–99)
Glucose-Capillary: 82 mg/dL (ref 70–99)

## 2019-08-19 LAB — MAGNESIUM: Magnesium: 2.8 mg/dL — ABNORMAL HIGH (ref 1.7–2.4)

## 2019-08-19 MED ORDER — DEXTROSE 50 % IV SOLN: 25.0000 mL | Freq: Once | INTRAVENOUS | Status: AC

## 2019-08-19 MED ORDER — DEXTROSE 50 % IV SOLN
INTRAVENOUS | Status: AC
  Administered 2019-08-19: 25 mL
  Filled 2019-08-19: qty 50

## 2019-08-19 MED ORDER — PREDNISONE 20 MG PO TABS
20.0000 mg | ORAL_TABLET | Freq: Every day | ORAL | Status: AC
  Administered 2019-08-21 – 2019-08-22 (×2): 20 mg via ORAL
  Filled 2019-08-19 (×3): qty 1

## 2019-08-19 MED ORDER — PANTOPRAZOLE SODIUM 40 MG IV SOLR
40.0000 mg | INTRAVENOUS | Status: DC
  Administered 2019-08-19: 40 mg via INTRAVENOUS
  Filled 2019-08-19: qty 40

## 2019-08-19 NOTE — Progress Notes (Signed)
OT Cancellation Note  Patient Details Name: Melanie Lewis MRN: [id number] DOB: [birth year]   Cancelled Treatment:    Reason Eval/Treat Not Completed: Medical issues which prohibited therapy. Order received chart reviewed. Pt noted with MEWS score of 5 and K+ value of 5.2 as of most recent reading. Will hold OT evaluation at this time as pt is contraindicated for exertional activity. Will initiate OT services as available and pt medically appropriate.   Rockney Ghee, M.S., OTR/L Ascom: [phone] 08/19/19, 9:15 AM

## 2019-08-19 NOTE — Progress Notes (Signed)
Remains in NSR and drowsy. Still hates mouth care. Treated once for low blood sugar and once for hypertension. Daughters in.

## 2019-08-19 NOTE — Progress Notes (Signed)
CRITICAL CARE PROGRESS NOTE    Name: Melanie Lewis MRN: [id number] DOB: December 06, 1934     LOS: 74   SUBJECTIVE FINDINGS & SIGNIFICANT EVENTS    Patient description:   85 yo F with PMH as below came in with chief complaint of difficulty articulating and SOB with swelling of face/lips and throat. She was intubated due to angioedema, this was thought to be due to lisinopril.   Marland Kitchen  SIGNIFICANT EVENTS 7/16 admitted to ICU for severe angioedema 7/17severe angioedema, resp failure 7/18 severe angioedema, resp failure 7/19 severe andioedema, resp failure 7/20 severe angioedema, resp failure 08/03/19- Daughter Melanie Lewis is at bedside, we discussed hospital course and care plan together. Questions answered. 08/04/19- patient is s/p FFP with significant improvement of severely swollen lips/tongue. 08/05/19 - patient has small cuff leak this am with overall improvement in a lingual and orofacial swelling however still has significant angioedema. 08/06/19-patient had additional improvement in angioedema. She had episode of AFrVR while family was present and they did say this happens to her occasionally, this was remedied with 2.5 lopressor x1 and po maintance metop 12.2bid. 7/26 cardiology consulted for afib, remains encephlopathic 7/27 remains encephalopathic 7/29 remains encephalopathic; MRI NEGATIVE for any acute intracranial abnormality 7/30:Consult Neurology for persistent Encephalopathy, thyroid function consistent with hyperthyroidism, query apathetic hyperthyroidism 8/01:Palliative care consult placed, need to establish goals of care. May need tracheostomy. 8/2-8/4 remains encephalopathic 8/6- patient remains with encephalopathy but she appears slightly more awake post modafinil addition. Spoke to daughter  today, patient is on SBT and doing well on 21% will liberate off MV if possible. Spoke to PAD daughter at length today, she wants trache.  08/19/19 - patient liberated from MV yesterday, no events overnight. Mental status improved.   Lines/tubes : Airway 7 mm (Active)  Secured at (cm) 24 cm 08/03/19 0804  Measured From Lips 08/03/19 [place] 08/03/19 0804  Secured By [employer] 08/03/19 0804  Tube Holder Repositioned Yes 08/03/19 0743  Cuff Pressure (cm H2O) 28 cm H2O 08/03/19 0743  Site Condition Cool;Dry;Edema 08/03/19 0743     NG/OG Tube Orogastric Right mouth Xray Documented cm marking at nare/ corner of mouth 60 cm (Active)  Cm Marking at Nare/Corner of Mouth (if applicable) 60 cm 94/80/16 0804  Site Assessment Clean;Dry;Intact 08/03/19 0804  Ongoing Placement Verification No change in cm markings or external length of tube from initial placement;No change in respiratory status 08/03/19 0804  Status Infusing tube feed 08/03/19 0804  Drainage Appearance None 08/02/19 2000  Intake (mL) 30 mL 07/31/19 0658  Output (mL) 0 mL 08/02/19 1557     Urethral Catheter Dee RN  Temperature probe 14 Fr. (Active)  Indication for Insertion or Continuance of Catheter Therapy based on hourly urine output monitoring and documentation for critical condition (NOT STRICT I&O) 08/03/19 0723  Site Assessment Clean;Intact 08/03/19 0723  Catheter Maintenance Bag below level of bladder;Catheter secured;Drainage bag/tubing not touching floor;Insertion date on drainage bag;No dependent loops;Seal intact;Bag emptied prior to transport 08/03/19 0723  Collection Container Standard drainage bag 08/03/19 0723  Securement Method Securing device (Describe) 08/03/19 0723  Urinary Catheter Interventions (if applicable) Unclamped 55/37/48 0723  Output (mL) 175 mL 08/03/19 0436    Microbiology/Sepsis markers: Results for orders placed or performed during the hospital encounter of  07/28/19  SARS Coronavirus 2 by RT PCR (hospital order, performed in [facility] hospital lab) Nasopharyngeal Nasopharyngeal Swab     Status: None   Collection Time: 07/28/19  3:43  PM   Specimen: Nasopharyngeal Swab  Result Value Ref Range Status   SARS Coronavirus 2 NEGATIVE NEGATIVE Final    Comment: (NOTE) SARS-CoV-2 target nucleic acids are NOT DETECTED.  The SARS-CoV-2 RNA is generally detectable in upper and lower respiratory specimens during the acute phase of infection. The lowest concentration of SARS-CoV-2 viral copies this assay can detect is 250 copies / mL. A negative result does not preclude SARS-CoV-2 infection and should not be used as the sole basis for treatment or other patient management decisions.  A negative result may occur with improper specimen collection / handling, submission of specimen other than nasopharyngeal swab, presence of viral mutation(s) within the areas targeted by this assay, and inadequate number of viral copies (<250 copies / mL). A negative result must be combined with clinical observations, patient history, and epidemiological information.  Fact Sheet for Patients:   StrictlyIdeas.no  Fact Sheet for Healthcare Providers: BankingDealers.co.za  This test is not yet approved or  cleared by the [place] FDA and has been authorized for detection and/or diagnosis of SARS-CoV-2 by FDA under an Emergency Use Authorization (EUA).  This EUA will remain in effect (meaning this test can be used) for the duration of the COVID-19 declaration under Section 564(b)(1) of the Act, 21 U.S.C. section 360bbb-3(b)(1), unless the authorization is terminated or revoked sooner.  Performed at [facility], [address] [place], [place] [address]   MRSA PCR Screening     Status: None   Collection Time: 07/28/19  5:46 PM   Specimen: Nasal Mucosa; Nasopharyngeal  Result Value Ref Range Status    MRSA by PCR NEGATIVE NEGATIVE Final    Comment:        The GeneXpert MRSA Assay (FDA approved for NASAL specimens only), is one component of a comprehensive MRSA colonization surveillance program. It is not intended to diagnose MRSA infection nor to guide or monitor treatment for MRSA infections. Performed at [facility], [address] [place], [place] [address]     Anti-infectives:  Anti-infectives (From admission, onward)   Start     Dose/Rate Route Frequency Ordered Stop   08/02/19 1200  Ampicillin-Sulbactam (UNASYN) 3 g in sodium chloride 0.9 % 100 mL IVPB  Status:  Discontinued        3 g 200 mL/hr over 30 Minutes Intravenous Every 6 hours 08/02/19 1014 08/07/19 1003        Tests / Events: C1esterase deficiency    PAST MEDICAL HISTORY   Past Medical History:  Diagnosis Date  . CHF (congestive heart failure) ([place])   . Coronary artery disease   . Dementia ([place])   . Heart attack ([place])   . Hypertension   . hyperthyroid   . Hyperthyroidism   . MI (myocardial infarction) [facility])      SURGICAL HISTORY   Past Surgical History:  Procedure Laterality Date  . CARDIAC CATHETERIZATION N/A 08/14/2014   Procedure: Left Heart Cath and Coronary Angiography;  Surgeon: Isaias Cowman, MD;  Location: [place] CV LAB;  Service: Cardiovascular;  Laterality: N/A;  . CHOLECYSTECTOMY    . PARTIAL HYSTERECTOMY       FAMILY HISTORY   Family History  Problem Relation Age of Onset  . Heart attack Sister   . Hypertension Sister   . Dementia Sister   . Heart attack Son   . Dementia Sister      SOCIAL HISTORY   Social History   Tobacco Use  . Smoking status: Never Smoker  .  Smokeless tobacco: Never Used  Substance Use Topics  . Alcohol use: Yes    Alcohol/week: 2.0 standard drinks    Types: 2 Shots of liquor per week    Comment: occasional  . Drug use: No     MEDICATIONS   Current Medication:  Current Facility-Administered Medications:   .  0.9 %  sodium chloride infusion, 250 mL, Intravenous, PRN, Flora Lipps, MD, Last Rate: 5 mL/hr at 08/10/19 1547, 250 mL at 08/10/19 1547 .  0.9 %  sodium chloride infusion, , Intravenous, Continuous, Ottie Glazier, MD, Stopped at 08/11/19 1601 .  acetaminophen (TYLENOL) tablet 650 mg, 650 mg, Oral, Q4H PRN, Mortimer Fries, Kurian, MD .  chlorhexidine gluconate (MEDLINE KIT) (PERIDEX) 0.12 % solution 15 mL, 15 mL, Mouth Rinse, BID, Kasa, Kurian, MD, 15 mL at 08/19/19 0745 .  Chlorhexidine Gluconate Cloth 2 % PADS 6 each, 6 each, Topical, Daily, Tukov-Yual, Magdalene S, NP, 6 each at 08/19/19 0545 .  enoxaparin (LOVENOX) injection 40 mg, 40 mg, Subcutaneous, Q24H, Kasa, Kurian, MD, 40 mg at 08/18/19 1000 .  fentaNYL (SUBLIMAZE) bolus via infusion 25 mcg, 25 mcg, Intravenous, Q1H PRN, Duffy Bruce, MD, 25 mcg at 07/31/19 0440 .  glycopyrrolate (ROBINUL) injection 0.1 mg, 0.1 mg, Intravenous, BID, Kasa, Kurian, MD, 0.1 mg at 08/18/19 2249 .  hydrALAZINE (APRESOLINE) injection 10 mg, 10 mg, Intravenous, Q4H PRN, Tukov-Yual, Magdalene S, NP, 10 mg at 08/18/19 2247 .  insulin aspart (novoLOG) injection 0-20 Units, 0-20 Units, Subcutaneous, Q4H, Flora Lipps, MD, 3 Units at 08/19/19 0744 .  MEDLINE mouth rinse, 15 mL, Mouth Rinse, 10 times per day, Flora Lipps, MD, 15 mL at 08/19/19 0545 .  methylPREDNISolone sodium succinate (SOLU-MEDROL) 40 mg/mL injection 20 mg, 20 mg, Intravenous, Q12H, Kasa, Kurian, MD, 20 mg at 08/18/19 2251 .  ondansetron (ZOFRAN) injection 4 mg, 4 mg, Intravenous, Q6H PRN, Flora Lipps, MD, 4 mg at 08/13/19 1736 .  pantoprazole (PROTONIX) injection 40 mg, 40 mg, Intravenous, Q24H, Yizel Canby, MD, 40 mg at 08/19/19 0730 .  sodium chloride flush (NS) 0.9 % injection 3 mL, 3 mL, Intravenous, Q12H, Kasa, Kurian, MD, 3 mL at 08/18/19 2252 .  sodium chloride flush (NS) 0.9 % injection 3 mL, 3 mL, Intravenous, PRN, Flora Lipps, MD    ALLERGIES   Lisinopril    REVIEW OF  SYSTEMS     Unable to obtain due to sedation with MV  PHYSICAL EXAMINATION   Vital Signs: Temp:  [97.7 F (36.5 C)-98.8 F (37.1 C)] 97.7 F (36.5 C) (08/07 0400) Pulse Rate:  [45-142] 73 (08/07 0600) Resp:  [17-28] 19 (08/07 0600) BP: (100-184)/(56-134) 159/100 (08/07 0600) SpO2:  [98 %-100 %] 100 % (08/07 0600) FiO2 (%):  [21 %] 21 % (08/06 0911) Weight:  [87.5 kg] 87.5 kg (08/07 0447)  GENERAL:age appropriate on MV HEAD: Normocephalic, atraumatic.  EYES: Pupils equal, round, reactive to light.  No scleral icterus. Periorbital swelling+ MOUTH: Moist mucosal membrane. Severely swollen lips and protrusion of swollen tongue NECK: Supple. No thyromegaly. No nodules. No JVD.  PULMONARY: mildly rhonchorous breath sounds with MV in background CARDIOVASCULAR: S1 and S2. Regular rate and rhythm. No murmurs, rubs, or gallops.  GASTROINTESTINAL: Soft, nontender, non-distended. No masses. Positive bowel sounds. No hepatosplenomegaly.  MUSCULOSKELETAL: No swelling, clubbing, or edema.  NEUROLOGIC: GCS4T SKIN:intact,warm,dry   PERTINENT DATA     Infusions: . sodium chloride 250 mL (08/10/19 1547)  . sodium chloride Stopped (08/11/19 1601)   Scheduled Medications: . chlorhexidine gluconate (MEDLINE KIT)  15 mL Mouth Rinse BID  . Chlorhexidine Gluconate Cloth  6 each Topical Daily  . enoxaparin (LOVENOX) injection  40 mg Subcutaneous Q24H  . glycopyrrolate  0.1 mg Intravenous BID  . insulin aspart  0-20 Units Subcutaneous Q4H  . mouth rinse  15 mL Mouth Rinse 10 times per day  . methylPREDNISolone (SOLU-MEDROL) injection  20 mg Intravenous Q12H  . pantoprazole (PROTONIX) IV  40 mg Intravenous Q24H  . sodium chloride flush  3 mL Intravenous Q12H   PRN Medications: sodium chloride, acetaminophen, fentaNYL, hydrALAZINE, ondansetron (ZOFRAN) IV, sodium chloride flush Hemodynamic parameters:   Intake/Output: 08/06 0701 - 08/07 0700 In: 90 [I.V.:30] Out: 9798  [Urine:1460]  Ventilator  Settings: Vent Mode: Spontaneous FiO2 (%):  [21 %] 21 % PEEP:  [5 cmH20] 5 cmH20 Pressure Support:  [8 cmH20] 8 cmH20 Plateau Pressure:  [16 cmH20] 16 cmH20    LAB RESULTS:  Basic Metabolic Panel: Recent Labs  Lab 08/14/19 0510 08/14/19 0510 08/15/19 0138 08/15/19 0138 08/16/19 0437 08/16/19 0437 08/16/19 0619 08/17/19 0647 08/18/19 0503  NA 133*  --  134*  --  137  --   --  136 138  K 3.0*   < > 4.2   < > 5.3*   < >  --  4.0 4.5  CL 107  --  111  --  113*  --   --  112* 114*  CO2 16*  --  17*  --  14*  --   --  15* 16*  GLUCOSE 163*  --  248*  --  159*  --   --  237* 226*  BUN 44*  --  50*  --  51*  --   --  64* 63*  CREATININE 0.68  --  0.83  --  0.81  --   --  0.77 0.75  0.83  CALCIUM 8.3*  --  8.1*  --  8.3*  --   --  8.6* 8.3*  MG 2.7*  --  2.5*  --   --   --  2.7* 2.8* 2.8*  PHOS 3.6  --  2.7  --  3.6  --   --  2.8 2.5   < > = values in this interval not displayed.   Liver Function Tests: Recent Labs  Lab 08/14/19 0510 08/15/19 0138 08/16/19 0437 08/17/19 0647 08/18/19 0503  ALBUMIN 2.3* 2.0* 2.0* 2.1* 2.2*   No results for input(s): LIPASE, AMYLASE in the last 168 hours. No results for input(s): AMMONIA in the last 168 hours. CBC: Recent Labs  Lab 08/14/19 0510 08/15/19 0138 08/16/19 1019 08/17/19 0647 08/18/19 0503  WBC 15.9* 13.1* 14.4* 11.4* 14.5*  NEUTROABS 14.4* 11.9* 12.3* 9.8* 12.8*  HGB 12.8 11.0* 12.2 12.1 13.2  HCT 38.3 33.4* 34.6* 38.1 38.8  MCV 87.4 89.1 84.6 89.6 87.4  PLT 126* 135* 163 190 210   Cardiac Enzymes: No results for input(s): CKTOTAL, CKMB, CKMBINDEX, TROPONINI in the last 168 hours. BNP: Invalid input(s): POCBNP CBG: Recent Labs  Lab 08/18/19 1641 08/18/19 2001 08/19/19 0020 08/19/19 0310 08/19/19 0730  GLUCAP 136* 120* 109* 148* 122*        ASSESSMENT AND PLAN    -Multidisciplinary rounds held today   Encephalopathy  -toxic metabolic vs CVA vs sedation recirculation    -neurology on case - appreciate input  - s/p extubation to nasal canula yesterday. -stopped glycopyrrolate due to encephalopathy, stopped solumedrol and placed on low dose pred for tapering off. Stopped protonix,  tylenol, zofran -8/7 - d/c any non essential Rx  Acute angioedema-resolved  - presumably due to ACE inhibitor  -completed Unasyn   Atrial fibrillation with rapid ventricular response   - metoprolol 12.5   - prn metoprolol IV q6h -oxygen as needed ICU telemetry monitoring  Accelerated hypertension -resolved    -HCTZ 50 mg with mild improvement advanced to 100 mg p.o. daily still with accelerated hypertension further improved, adding Norvasc 5 mg p.o. daily to her regimen. -d/c clonidine due to recurrent rebound episodes of hypertensive urgency  GI/Nutrition GI PROPHYLAXIS as indicated-Orogastric tube feeding intiated - discussed with Jacklynn Barnacle RD DIET-->TF's as tolerated Constipation protocol as indicated  ENDO - ICU hypoglycemic\Hyperglycemia protocol -check FSBS per protocol   ELECTROLYTES -follow labs as needed -replace as needed -pharmacy consultation   DVT/GI PRX ordered -SCDs  TRANSFUSIONS AS NEEDED MONITOR FSBS ASSESS the need for LABS as needed   Critical care provider statement:    Critical care time (minutes):  33   Critical care time was exclusive of:  Separately billable procedures and treating other patients   Critical care was necessary to treat or prevent imminent or life-threatening deterioration of the following conditions:  angioedema, accelerated HTN,    Critical care was time spent personally by me on the following activities:  Development of treatment plan with patient or surrogate, discussions with consultants, evaluation of patient's response to treatment, examination of patient, obtaining history from patient or surrogate, ordering and performing treatments and interventions, ordering and review of laboratory studies and re-evaluation of  patient's condition.  I assumed direction of critical care for this patient from another provider in my specialty: no    This document was prepared using Dragon voice recognition software and may include unintentional dictation errors.    Ottie Glazier, M.D.  Division of [place]

## 2019-08-19 NOTE — Progress Notes (Signed)
PT Cancellation Note  Patient Details Name: Melanie Lewis MRN: [id number] DOB: [birth year]   Cancelled Treatment:    Reason Eval/Treat Not Completed: Patient not medically ready.  PT consult received.  Chart reviewed.  Pt extubated yesterday 8/6 per notes.  Pt noted with MEWS score of 6 and K+ elevated to 5.2 today.  Per PT guidelines for elevated potassium, exertional activity contraindicated.  Will re-attempt PT evaluation at a later date/time as medically appropriate.  Hendricks Limes, PT 08/19/19, 1:27 PM

## 2019-08-19 NOTE — Progress Notes (Signed)
PHARMACY CONSULT NOTE  Pharmacy Consult for Electrolyte Monitoring and Replacement   Recent Labs: Potassium (mmol/L)  Date Value  08/19/2019 5.2 (H)  02/11/2014 4.3   Magnesium (mg/dL)  Date Value  60/10/9321 2.8 (H)  02/07/2014 1.8   Calcium (mg/dL)  Date Value  55/73/2202 8.1 (L)   Calcium, Total (mg/dL)  Date Value  54/27/0623 9.4   Albumin (g/dL)  Date Value  76/28/3151 1.9 (L)  02/11/2014 2.9 (L)   Phosphorus (mg/dL)  Date Value  76/16/0737 3.1   Sodium (mmol/L)  Date Value  08/19/2019 141  09/18/2014 142  02/11/2014 139   Corrected Ca: 9.78 mg/dL   Assessment: 85 year old female admitted to the ICU with angioedema s/t ACE inhibitor use. Angioedema improved, patient with encephalopathy and significant hyperthyroidism.She was extubated on 08/18/19. Pharmacy to manage electrolytes.  Goal of Therapy:  Electrolytes WNL  Plan:   No electrolytes replacement warranted today   F/U electrolytes with morning labs  Lowella Bandy ,PharmD Clinical Pharmacist 08/19/2019 10:08 AM

## 2019-08-20 ENCOUNTER — Inpatient Hospital Stay: Payer: Medicare Other

## 2019-08-20 LAB — GLUCOSE, CAPILLARY
Glucose-Capillary: 111 mg/dL — ABNORMAL HIGH (ref 70–99)
Glucose-Capillary: 72 mg/dL (ref 70–99)
Glucose-Capillary: 84 mg/dL (ref 70–99)
Glucose-Capillary: 90 mg/dL (ref 70–99)
Glucose-Capillary: 93 mg/dL (ref 70–99)
Glucose-Capillary: 94 mg/dL (ref 70–99)

## 2019-08-20 LAB — CBC WITH DIFFERENTIAL/PLATELET
Abs Immature Granulocytes: 0.07 10*3/uL (ref 0.00–0.07)
Basophils Absolute: 0 10*3/uL (ref 0.0–0.1)
Basophils Relative: 0 %
Eosinophils Absolute: 0.1 10*3/uL (ref 0.0–0.5)
Eosinophils Relative: 1 %
HCT: 37.3 % (ref 36.0–46.0)
Hemoglobin: 11.8 g/dL — ABNORMAL LOW (ref 12.0–15.0)
Immature Granulocytes: 1 %
Lymphocytes Relative: 16 %
Lymphs Abs: 1.5 10*3/uL (ref 0.7–4.0)
MCH: 29.6 pg (ref 26.0–34.0)
MCHC: 31.6 g/dL (ref 30.0–36.0)
MCV: 93.5 fL (ref 80.0–100.0)
Monocytes Absolute: 0.7 10*3/uL (ref 0.1–1.0)
Monocytes Relative: 7 %
Neutro Abs: 7 10*3/uL (ref 1.7–7.7)
Neutrophils Relative %: 75 %
Platelets: 157 10*3/uL (ref 150–400)
RBC: 3.99 MIL/uL (ref 3.87–5.11)
RDW: 15.3 % (ref 11.5–15.5)
WBC: 9.3 10*3/uL (ref 4.0–10.5)
nRBC: 0 % (ref 0.0–0.2)

## 2019-08-20 LAB — RENAL FUNCTION PANEL
Albumin: 2.1 g/dL — ABNORMAL LOW (ref 3.5–5.0)
Anion gap: 7 (ref 5–15)
BUN: 37 mg/dL — ABNORMAL HIGH (ref 8–23)
CO2: 20 mmol/L — ABNORMAL LOW (ref 22–32)
Calcium: 8.5 mg/dL — ABNORMAL LOW (ref 8.9–10.3)
Chloride: 116 mmol/L — ABNORMAL HIGH (ref 98–111)
Creatinine, Ser: 0.6 mg/dL (ref 0.44–1.00)
GFR calc Af Amer: >60 mL/min (ref >60)
GFR calc non Af Amer: >60 mL/min (ref >60)
Glucose, Bld: 109 mg/dL — ABNORMAL HIGH (ref 70–99)
Phosphorus: 3.1 mg/dL (ref 2.5–4.6)
Potassium: 4.4 mmol/L (ref 3.5–5.1)
Sodium: 143 mmol/L (ref 135–145)

## 2019-08-20 LAB — MAGNESIUM: Magnesium: 2.6 mg/dL — ABNORMAL HIGH (ref 1.7–2.4)

## 2019-08-20 MED ORDER — METOPROLOL TARTRATE 25 MG PO TABS: 12.5000 mg | ORAL_TABLET | Freq: Two times a day (BID) | ORAL | Status: DC

## 2019-08-20 MED ORDER — METOPROLOL TARTRATE 5 MG/5ML IV SOLN
5.0000 mg | INTRAVENOUS | Status: DC | PRN
  Administered 2019-08-20: 5 mg via INTRAVENOUS
  Filled 2019-08-20: qty 5

## 2019-08-20 MED ORDER — METOPROLOL TARTRATE 5 MG/5ML IV SOLN
2.5000 mg | INTRAVENOUS | Status: DC | PRN
  Administered 2019-08-20: 2.5 mg via INTRAVENOUS
  Filled 2019-08-20: qty 5

## 2019-08-20 MED ORDER — PROPYLTHIOURACIL 50 MG PO TABS
50.0000 mg | ORAL_TABLET | Freq: Three times a day (TID) | ORAL | Status: DC
  Administered 2019-08-21 – 2019-08-28 (×22): 50 mg via NASOGASTRIC
  Filled 2019-08-20 (×26): qty 1

## 2019-08-20 MED ORDER — PROPRANOLOL HCL 20 MG/5ML PO SOLN
20.0000 mg | Freq: Three times a day (TID) | ORAL | Status: DC
  Administered 2019-08-21 – 2019-08-28 (×26): 20 mg
  Filled 2019-08-20 (×31): qty 5

## 2019-08-20 MED ORDER — DILTIAZEM HCL 25 MG/5ML IV SOLN
10.0000 mg | Freq: Once | INTRAVENOUS | Status: AC
  Administered 2019-08-21: 10 mg via INTRAVENOUS
  Filled 2019-08-20 (×2): qty 5

## 2019-08-20 NOTE — Progress Notes (Signed)
PCCM to [facility] transfer:  Patient with h/o CAD; thyroid disease; dementia; and CHF who presented on 7/16 with angioedema from Lisinopril.  Severe angioedema on presentation, required intubation.  Has had a difficult course.  Extubated a few days ago, now with metabolic encephalopathy and neurology is following.  Started on Provigil with some improvement.  On RA at 99%.  TRH will assume care on 8/9.  Georgana Curio, M.D.

## 2019-08-20 NOTE — Progress Notes (Signed)
CH visited pt. while rounding on ICU; pt. extubated yesterday, dtrs. at bedside shared.  Pt. appeared sleepy but opened her eyes intermittently and seemed aware of CH's presence in rm.  Dtrs. grateful for progress pt. is making and credit her recovery to God's provision; family requests continued prayer.  Chaplains will continue to follow.

## 2019-08-20 NOTE — Progress Notes (Signed)
Received pt with high HR base on Telemetry between 140-160's, on Afib. MD was notified and assessed the pt. MD ordered Metoprolol IV 2.5mg  and was given at 1534. BP was elevated 192/147mmHg, Hydralazine PRN was given, rechecked after med was given 134/64mmHg. MD was notified about the HR between 130-150's. Metoprolol 5mg  IV was given. HR is between 110-130's. Manager of 2A came to see the pt and RN about possible transfer, the problem is possible because pt was not receiving her Home meds and manager told to keep the pt since the pt is responding with the medication and to reevaluate until the next Metoprolol at around 8pm. Charge nurse was notified too.

## 2019-08-20 NOTE — Evaluation (Signed)
Occupational Therapy Evaluation Patient Details Name: Melanie Lewis MRN: [id number] DOB: 21-Nov-1934 Today's Date: 08/20/2019    History of Present Illness 85 yo F with PMH: CAD, CHF, dementia and MI who came in with chief complaint of difficulty articulating and SOB with swelling of face/lips and throat. She was intubated due to angioedema, this was thought to be due to lisinopril. Pt extubated 08/18/19.   Clinical Impression   Pt was seen for OT evaluation this date. Prior to hospital admission, pt was reportedly (according to her daughter as pt is unable to provide hx) Indep with all self care and HH IADLs. Pt lives with dtr in [facility] with level entry. Currently pt demonstrates impairments as described below (See OT problem list) which functionally limit her ability to perform ADL/self-care tasks. Pt currently requires TOTAL A in all aspects of self care and bed mobility. Pt is not safe to mobilize OOB At this time as she is still largely encephalopathic and unable to follow commands. On assessment of UEs, they're largely edematous and OT facilitates retrograde massage and elevation. In addition, OT educates pt's dtr re: PROM (see "other exercise" section for more detail).  Pt would benefit from skilled OT to address noted impairments and functional limitations (see below for any additional details) in order to maximize safety and independence while minimizing caregiver burden. Upon hospital discharge, recommend STR to maximize pt safety and return to PLOF.     Follow Up Recommendations  SNF;Supervision/Assistance - 24 hour    Equipment Recommendations  Other (comment) (TBD, likely defer to nexte venue of care.)    Recommendations for Other Services       Precautions / Restrictions Precautions Precautions: Fall Restrictions Weight Bearing Restrictions: No Other Position/Activity Restrictions: monitor HR, fluctuating at rest this date up to 140, all OT assessment compelted bed level       Mobility Bed Mobility               General bed mobility comments: deferred  Transfers                 General transfer comment: deferred, not safe at this time    Balance                                           ADL either performed or assessed with clinical judgement   ADL Overall ADL's : Needs assistance/impaired                                       General ADL Comments: minimal ADL assessment data able to be collected. Pt is essentially TOTAL A for all aspects of care at this time. Somewhat engaged while OT performs oral care, with MAX multimodal cueing she opens her mouth to allow oral care to take place.     Vision   Additional Comments: unable to formally assess d/t cognitive status. Pt with ~15% visual attention during session.     Perception     Praxis      Pertinent Vitals/Pain Pain Assessment: Faces Faces Pain Scale: No hurt     Hand Dominance     Extremity/Trunk Assessment Upper Extremity Assessment Upper Extremity Assessment: Generalized weakness (B UEs swollen. OT elevates. Pt only participates in attempting to flex L shoulder, all other  mobilization of UEs is passive. Pt PROM WFL in all planes.)   Lower Extremity Assessment Lower Extremity Assessment: Defer to PT evaluation;Generalized weakness       Communication Communication Communication: Other (comment) (encephalopathic, pt does not communicate with therapist this date, ~15% visual attention)   Cognition Arousal/Alertness: Awake/alert Behavior During Therapy: Flat affect Overall Cognitive Status: Impaired/Different from baseline Area of Impairment: Orientation;Attention;Memory;Following commands;Safety/judgement;Awareness;Problem solving;Rancho level               Rancho Levels of Cognitive Functioning Ranch[place] Mirant Scales of Cognitive Functioning: Confused/inappropriate/non-agitated Orientation Level: Disoriented  to;Person;Place;Time;Situation Current Attention Level: Divided     Safety/Judgement: Decreased awareness of safety;Decreased awareness of deficits Awareness: Anticipatory Problem Solving: Slow processing;Decreased initiation;Difficulty sequencing;Requires verbal cues;Requires tactile cues General Comments: pt with some visual attention (~15% t/o session), no verbalizations during session, follows one command and minimally participates in attempting to raise L UE.   General Comments  NT    Exercises Other Exercises Other Exercises: OT performs gentle retrograde massage to pt's UEs and elevates on two pillows each to help reduce edema. Dtr educated about rationale and RN notified of positioning. Other Exercises: OT facilitates education with pt's dtr who is present throughout re: gentle PROM of UEs-each joint including support points to maintain joint integrity. Pt's daughter with good understanding, but would benefit from follow up.   Shoulder Instructions      Home Living Family/patient expects to be discharged to:: Private residence Living Arrangements: Children Available Help at Discharge: Family;Available PRN/intermittently (dtr works) Type of Home: House Home Access: Level entry     Home Layout: One level     Bathroom Shower/Tub: Dietitian: Cane - single point;Shower seat;Bedside commode   Additional Comments: BSC and shower seat belonged to patient's sister, pt did not use.      Prior Functioning/Environment Level of Independence: Independent        Comments: has SPC that her dtr reports she would use very rarely. States that her mother was able to cook and clean, performed all her BADLs, managed her meds herself, and had not falls in last year. Dtr states that she would drive her mother to appts.        OT Problem List: Increased edema;Cardiopulmonary status limiting activity;Decreased cognition;Decreased coordination;Impaired  balance (sitting and/or standing);Decreased activity tolerance;Decreased range of motion;Decreased strength      OT Treatment/Interventions: Self-care/ADL training;Therapeutic exercise;DME and/or AE instruction;Therapeutic activities;Balance training;Patient/family education    OT Goals(Current goals can be found in the care plan section) Acute Rehab OT Goals Patient Stated Goal: pt's daughter states she would like for her mom to be able to wake up and get some strength and mobility back OT Goal Formulation: With family Time For Goal Achievement: 09/03/19 Potential to Achieve Goals: Fair  OT Frequency: Min 1X/week   Barriers to D/C:            Co-evaluation              AM-PAC OT "6 Clicks" Daily Activity     Outcome Measure Help from another person eating meals?: Total Help from another person taking care of personal grooming?: Total Help from another person toileting, which includes using toliet, bedpan, or urinal?: Total Help from another person bathing (including washing, rinsing, drying)?: Total Help from another person to put on and taking off regular upper body clothing?: Total Help from another person to put on and taking off regular lower  body clothing?: Total 6 Click Score: 6   End of Session Nurse Communication: Other (comment) (positioning recommendations)  Activity Tolerance: Treatment limited secondary to medical complications (Comment) Patient left: in bed;with call bell/phone within reach;with bed alarm set;with family/visitor present  OT Visit Diagnosis: Other abnormalities of gait and mobility (R26.89);Muscle weakness (generalized) (M62.81)                Time: 7989-2119 OT Time Calculation (min): 39 min Charges:  OT General Charges $OT Visit: 1 Visit OT Evaluation $OT Eval Moderate Complexity: 1 Mod OT Treatments $Self Care/Home Management : 8-22 mins $Therapeutic Activity: 8-22 mins  Rejeana Brock, MS, OTR/L ascom [phone] 08/20/19, 8:12 PM

## 2019-08-20 NOTE — Progress Notes (Signed)
PHARMACY CONSULT NOTE  Pharmacy Consult for Electrolyte Monitoring and Replacement   Recent Labs: Potassium (mmol/L)  Date Value  08/20/2019 4.4  02/11/2014 4.3   Magnesium (mg/dL)  Date Value  62/26/3335 2.6 (H)  02/07/2014 1.8   Calcium (mg/dL)  Date Value  45/62/5638 8.5 (L)   Calcium, Total (mg/dL)  Date Value  93/73/4287 9.4   Albumin (g/dL)  Date Value  68/11/5724 2.1 (L)  02/11/2014 2.9 (L)   Phosphorus (mg/dL)  Date Value  20/35/5974 3.1   Sodium (mmol/L)  Date Value  08/20/2019 143  09/18/2014 142  02/11/2014 139   Corrected Ca: 10.0 mg/dL   Assessment: 85 year old female admitted to the ICU with angioedema s/t ACE inhibitor use. Angioedema improved, patient with encephalopathy and significant hyperthyroidism.She was extubated on 08/18/19. Pharmacy to manage electrolytes.  Goal of Therapy:  Electrolytes WNL  Plan:   No electrolytes replacement warranted today   Potassium now wnl, magnesium trending down  F/U electrolytes with morning labs  Lowella Bandy ,PharmD Clinical Pharmacist 08/20/2019 8:39 AM

## 2019-08-20 NOTE — Progress Notes (Addendum)
Pt's heart remains elevated. Pt is scoring red on the MEWS. ICU Medical Provider Tukov-Yual informed and new orders received for metoprolol. Will continue to monitor.   NG tube passed to left nostril. No obvious polyps or septal deviation. Medical provider informed. Pt tolerated the procedure without difficulty. Vital signs checked after placement. Oral care provided.  Awaiting xray to check for placement.   Call from Telemetry to inform that pt remains in A-Fib and RVR went up to 170. Medical provider was informed at 2204 via secure chat.

## 2019-08-20 NOTE — Progress Notes (Signed)
PT Cancellation Note  Patient Details Name: Melanie Lewis MRN: [id number] DOB: 09-25-1934   Cancelled Treatment:    Reason Eval/Treat Not Completed: Patient not medically ready.  Chart reviewed.  Upon PT arriving to pt's room, pt appearing to be sleeping in bed with HR fluctuating between 117-138 bpm on monitor.  D/t fluctuating elevated HR at rest, pt does not appear appropriate for exertional activity at this time.  Will re-attempt PT evaluation at a later date/time as medically appropriate.  Hendricks Limes, PT 08/20/19, 9:05 AM

## 2019-08-20 NOTE — Progress Notes (Signed)
CRITICAL CARE PROGRESS NOTE    Name: Melanie Lewis MRN: [id number] DOB: [birth year]     LOS: 30   SUBJECTIVE FINDINGS & SIGNIFICANT EVENTS    Patient description:   85 yo F with PMH as below came in with chief complaint of difficulty articulating and SOB with swelling of face/lips and throat. She was intubated due to angioedema, this was thought to be due to lisinopril.   Marland Kitchen  SIGNIFICANT EVENTS 7/16 admitted to ICU for severe angioedema 7/17severe angioedema, resp failure 7/18 severe angioedema, resp failure 7/19 severe andioedema, resp failure 7/20 severe angioedema, resp failure 08/03/19- Daughter Melanie Lewis is at bedside, we discussed hospital course and care plan together. Questions answered. 08/04/19- patient is s/p FFP with significant improvement of severely swollen lips/tongue. 08/05/19 - patient has small cuff leak this am with overall improvement in a lingual and orofacial swelling however still has significant angioedema. 08/06/19-patient had additional improvement in angioedema. She had episode of AFrVR while family was present and they did say this happens to her occasionally, this was remedied with 2.5 lopressor x1 and po maintance metop 12.2bid. 7/26 cardiology consulted for afib, remains encephlopathic 7/27 remains encephalopathic 7/29 remains encephalopathic; MRI NEGATIVE for any acute intracranial abnormality 7/30:Consult Neurology for persistent Encephalopathy, thyroid function consistent with hyperthyroidism, query apathetic hyperthyroidism 8/01:Palliative care consult placed, need to establish goals of care. May need tracheostomy. 8/2-8/4 remains encephalopathic 8/6- patient remains with encephalopathy but she appears slightly more awake post modafinil addition. Spoke to daughter  today, patient is on SBT and doing well on 21% will liberate off MV if possible. Spoke to PAD daughter at length today, she wants trache.  08/19/19 - patient liberated from MV yesterday, no events overnight. Mental status improved. 08/20/19- patient continues to show signs of improvement. shes on room air with spo2 99%. Neuro following for residual altered mentation. Optimizing for TRH transfer- signed out - Dr Lorin Mercy   Lines/tubes : Airway 7 mm (Active)  Secured at (cm) 24 cm 08/03/19 0804  Measured From Lips 08/03/19 [place] 08/03/19 0804  Secured By [employer] 08/03/19 0804  Tube Holder Repositioned Yes 08/03/19 0743  Cuff Pressure (cm H2O) 28 cm H2O 08/03/19 0743  Site Condition Cool;Dry;Edema 08/03/19 0743     NG/OG Tube Orogastric Right mouth Xray Documented cm marking at nare/ corner of mouth 60 cm (Active)  Cm Marking at Nare/Corner of Mouth (if applicable) 60 cm 69/67/89 0804  Site Assessment Clean;Dry;Intact 08/03/19 0804  Ongoing Placement Verification No change in cm markings or external length of tube from initial placement;No change in respiratory status 08/03/19 0804  Status Infusing tube feed 08/03/19 0804  Drainage Appearance None 08/02/19 2000  Intake (mL) 30 mL 07/31/19 0658  Output (mL) 0 mL 08/02/19 1557     Urethral Catheter Dee RN  Temperature probe 14 Fr. (Active)  Indication for Insertion or Continuance of Catheter Therapy based on hourly urine output monitoring and documentation for critical condition (NOT STRICT I&O) 08/03/19 0723  Site Assessment Clean;Intact 08/03/19 0723  Catheter Maintenance Bag below level of bladder;Catheter secured;Drainage bag/tubing not touching floor;Insertion date on drainage bag;No dependent loops;Seal intact;Bag emptied prior to transport 08/03/19 0723  Collection Container Standard drainage bag 08/03/19 0723  Securement Method Securing device (Describe) 08/03/19 0723  Urinary Catheter Interventions  (if applicable) Unclamped 38/10/17 0723  Output (mL) 175 mL 08/03/19 0436    Microbiology/Sepsis markers: Results for orders placed or performed during the hospital encounter of 07/28/19  SARS Coronavirus 2 by RT PCR (hospital order, performed in [facility] hospital lab) Nasopharyngeal Nasopharyngeal Swab     Status: None   Collection Time: 07/28/19  3:43 PM   Specimen: Nasopharyngeal Swab  Result Value Ref Range Status   SARS Coronavirus 2 NEGATIVE NEGATIVE Final    Comment: (NOTE) SARS-CoV-2 target nucleic acids are NOT DETECTED.  The SARS-CoV-2 RNA is generally detectable in upper and lower respiratory specimens during the acute phase of infection. The lowest concentration of SARS-CoV-2 viral copies this assay can detect is 250 copies / mL. A negative result does not preclude SARS-CoV-2 infection and should not be used as the sole basis for treatment or other patient management decisions.  A negative result may occur with improper specimen collection / handling, submission of specimen other than nasopharyngeal swab, presence of viral mutation(s) within the areas targeted by this assay, and inadequate number of viral copies (<250 copies / mL). A negative result must be combined with clinical observations, patient history, and epidemiological information.  Fact Sheet for Patients:   StrictlyIdeas.no  Fact Sheet for Healthcare Providers: BankingDealers.co.za  This test is not yet approved or  cleared by the [place] FDA and has been authorized for detection and/or diagnosis of SARS-CoV-2 by FDA under an Emergency Use Authorization (EUA).  This EUA will remain in effect (meaning this test can be used) for the duration of the COVID-19 declaration under Section 564(b)(1) of the Act, 21 U.S.C. section 360bbb-3(b)(1), unless the authorization is terminated or revoked sooner.  Performed at [facility], [address] [place], [place] [address]   MRSA PCR Screening     Status: None   Collection Time: 07/28/19  5:46 PM   Specimen: Nasal Mucosa; Nasopharyngeal  Result Value Ref Range Status   MRSA by PCR NEGATIVE NEGATIVE Final    Comment:        The GeneXpert MRSA Assay (FDA approved for NASAL specimens only), is one component of a comprehensive MRSA colonization surveillance program. It is not intended to diagnose MRSA infection nor to guide or monitor treatment for MRSA infections. Performed at [facility], [address], [place], [place] [address]     Anti-infectives:  Anti-infectives (From admission, onward)   Start     Dose/Rate Route Frequency Ordered Stop   08/02/19 1200  Ampicillin-Sulbactam (UNASYN) 3 g in sodium chloride 0.9 % 100 mL IVPB  Status:  Discontinued        3 g 200 mL/hr over 30 Minutes Intravenous Every 6 hours 08/02/19 1014 08/07/19 1003        Tests / Events: C1esterase deficiency    PAST MEDICAL HISTORY   Past Medical History:  Diagnosis Date  . CHF (congestive heart failure) ([place])   . Coronary artery disease   . Dementia ([place])   . Heart attack ([place])   . Hypertension   . hyperthyroid   . Hyperthyroidism   . MI (myocardial infarction) [facility])      SURGICAL HISTORY   Past Surgical History:  Procedure Laterality Date  . CARDIAC CATHETERIZATION N/A 08/14/2014   Procedure: Left Heart Cath and Coronary Angiography;  Surgeon: Isaias Cowman, MD;  Location: [place] CV LAB;  Service: Cardiovascular;  Laterality: N/A;  . CHOLECYSTECTOMY    . PARTIAL HYSTERECTOMY       FAMILY HISTORY   Family History  Problem Relation Age of Onset  . Heart attack Sister   . Hypertension Sister   . Dementia Sister   .  Heart attack Son   . Dementia Sister      SOCIAL HISTORY   Social History   Tobacco Use  . Smoking status: Never Smoker  . Smokeless tobacco: Never Used  Substance Use Topics  . Alcohol use: Yes    Alcohol/week: 2.0  standard drinks    Types: 2 Shots of liquor per week    Comment: occasional  . Drug use: No     MEDICATIONS   Current Medication:  Current Facility-Administered Medications:  .  0.9 %  sodium chloride infusion, 250 mL, Intravenous, PRN, Flora Lipps, MD, Last Rate: 5 mL/hr at 08/10/19 1547, 250 mL at 08/10/19 1547 .  0.9 %  sodium chloride infusion, , Intravenous, Continuous, Ottie Glazier, MD, Stopped at 08/11/19 1601 .  chlorhexidine gluconate (MEDLINE KIT) (PERIDEX) 0.12 % solution 15 mL, 15 mL, Mouth Rinse, BID, Kasa, Kurian, MD, 15 mL at 08/19/19 2033 .  Chlorhexidine Gluconate Cloth 2 % PADS 6 each, 6 each, Topical, Daily, Tukov-Yual, Magdalene S, NP, 6 each at 08/19/19 0545 .  enoxaparin (LOVENOX) injection 40 mg, 40 mg, Subcutaneous, Q24H, Kasa, Kurian, MD, 40 mg at 08/19/19 1021 .  hydrALAZINE (APRESOLINE) injection 10 mg, 10 mg, Intravenous, Q4H PRN, Tukov-Yual, Magdalene S, NP, 10 mg at 08/18/19 2247 .  insulin aspart (novoLOG) injection 0-20 Units, 0-20 Units, Subcutaneous, Q4H, Flora Lipps, MD, 3 Units at 08/19/19 0744 .  MEDLINE mouth rinse, 15 mL, Mouth Rinse, 10 times per day, Flora Lipps, MD, 15 mL at 08/20/19 0358 .  predniSONE (DELTASONE) tablet 20 mg, 20 mg, Oral, Q breakfast, Sabiha Sura, MD .  sodium chloride flush (NS) 0.9 % injection 3 mL, 3 mL, Intravenous, Q12H, Kasa, Kurian, MD, 3 mL at 08/19/19 2140 .  sodium chloride flush (NS) 0.9 % injection 3 mL, 3 mL, Intravenous, PRN, Flora Lipps, MD, 3 mL at 08/19/19 1022    ALLERGIES   Lisinopril    REVIEW OF SYSTEMS     Unable to obtain due to sedation with MV  PHYSICAL EXAMINATION   Vital Signs: Temp:  [98 F (36.7 C)-98.3 F (36.8 C)] 98 F (36.7 C) (08/08 0500) Pulse Rate:  [25-132] 92 (08/08 0600) Resp:  [14-33] 16 (08/08 0600) BP: (117-158)/(57-98) 125/62 (08/08 0600) SpO2:  [100 %] 100 % (08/08 0600)  GENERAL:age appropriate on MV HEAD: Normocephalic, atraumatic.  EYES: Pupils  equal, round, reactive to light.  No scleral icterus. Periorbital swelling resolved MOUTH: Moist mucosal membrane. Severely swollen lips and protrusion of swollen tongue- resolved  NECK: Supple. No thyromegaly. No nodules. No JVD.  PULMONARY: mildly rhonchorous breath sounds with MV in background CARDIOVASCULAR: S1 and S2. Regular rate and rhythm. No murmurs, rubs, or gallops.  GASTROINTESTINAL: Soft, nontender, non-distended. No masses. Positive bowel sounds. No hepatosplenomegaly.  MUSCULOSKELETAL: No swelling, clubbing, or edema.  NEUROLOGIC: GCS10 SKIN:intact,warm,dry   PERTINENT DATA     Infusions: . sodium chloride 250 mL (08/10/19 1547)  . sodium chloride Stopped (08/11/19 1601)   Scheduled Medications: . chlorhexidine gluconate (MEDLINE KIT)  15 mL Mouth Rinse BID  . Chlorhexidine Gluconate Cloth  6 each Topical Daily  . enoxaparin (LOVENOX) injection  40 mg Subcutaneous Q24H  . insulin aspart  0-20 Units Subcutaneous Q4H  . mouth rinse  15 mL Mouth Rinse 10 times per day  . predniSONE  20 mg Oral Q breakfast  . sodium chloride flush  3 mL Intravenous Q12H   PRN Medications: sodium chloride, hydrALAZINE, sodium chloride flush Hemodynamic parameters:   Intake/Output:  08/07 0701 - 08/08 0700 In: 6 [I.V.:6] Out: 400 [Urine:400]  Ventilator  Settings:      LAB RESULTS:  Basic Metabolic Panel: Recent Labs  Lab 08/15/19 0138 08/16/19 0437 08/16/19 8413 08/16/19 2440 08/17/19 1027 08/17/19 2536 08/18/19 0503 08/18/19 0503 08/19/19 0744 08/20/19 0604  NA  --  137  --   --  136  --  138  --  141 143  K  --  5.3*   < >  --  4.0   < > 4.5   < > 5.2* 4.4  CL  --  113*  --   --  112*  --  114*  --  115* 116*  CO2  --  14*  --   --  15*  --  16*  --  16* 20*  GLUCOSE  --  159*  --   --  237*  --  226*  --  121* 109*  BUN  --  51*  --   --  64*  --  63*  --  52* 37*  CREATININE  --  0.81  --   --  0.77  --  0.75  0.83  --  0.68 0.60  CALCIUM  --  8.3*  --   --   8.6*  --  8.3*  --  8.1* 8.5*  MG   < >  --   --  2.7* 2.8*  --  2.8*  --  2.8* 2.6*  PHOS  --  3.6  --   --  2.8  --  2.5  --  3.1 3.1   < > = values in this interval not displayed.   Liver Function Tests: Recent Labs  Lab 08/16/19 0437 08/17/19 0647 08/18/19 0503 08/19/19 0744 08/20/19 0604  ALBUMIN 2.0* 2.1* 2.2* 1.9* 2.1*   No results for input(s): LIPASE, AMYLASE in the last 168 hours. No results for input(s): AMMONIA in the last 168 hours. CBC: Recent Labs  Lab 08/16/19 1019 08/17/19 0647 08/18/19 0503 08/19/19 0744 08/20/19 0604  WBC 14.4* 11.4* 14.5* 12.5* 9.3  NEUTROABS 12.3* 9.8* 12.8* 10.1* 7.0  HGB 12.2 12.1 13.2 11.8* 11.8*  HCT 34.6* 38.1 38.8 39.1 37.3  MCV 84.6 89.6 87.4 96.8 93.5  PLT 163 190 210 119* 157   Cardiac Enzymes: No results for input(s): CKTOTAL, CKMB, CKMBINDEX, TROPONINI in the last 168 hours. BNP: Invalid input(s): POCBNP CBG: Recent Labs  Lab 08/19/19 1628 08/19/19 1630 08/19/19 1703 08/20/19 0008 08/20/19 0350  GLUCAP 65* 55* 107* 84 93        ASSESSMENT AND PLAN    -Multidisciplinary rounds held today   Encephalopathy  -toxic metabolic vs CVA vs sedation recirculation   -neurology on case - appreciate input  - s/p extubation to nasal canula 8/6 -stopped glycopyrrolate due to encephalopathy, stopped solumedrol and placed on low dose pred for tapering off. Stopped protonix, tylenol, zofran -8/7 - d/c any non essential Rx   Acute angioedema-resolved  - presumably due to ACE inhibitor  -completed Unasyn   Atrial fibrillation with rapid ventricular response-resolved    - metoprolol 12.5   - prn metoprolol IV q6h -oxygen as needed ICU telemetry monitoring  Accelerated hypertension -resolved    -HCTZ 50 mg with mild improvement advanced to 100 mg p.o. daily still with accelerated hypertension further improved, adding Norvasc 5 mg p.o. daily to her regimen. -d/c clonidine due to recurrent rebound episodes of  hypertensive urgency  GI/Nutrition GI PROPHYLAXIS as indicated-Orogastric  tube feeding intiated - discussed with Jacklynn Barnacle RD DIET-->TF's as tolerated Constipation protocol as indicated  ENDO - ICU hypoglycemic\Hyperglycemia protocol -check FSBS per protocol   ELECTROLYTES -follow labs as needed -replace as needed -pharmacy consultation   DVT/GI PRX ordered -SCDs  TRANSFUSIONS AS NEEDED MONITOR FSBS ASSESS the need for LABS as needed   Critical care provider statement:    Critical care time (minutes):  33   Critical care time was exclusive of:  Separately billable procedures and treating other patients   Critical care was necessary to treat or prevent imminent or life-threatening deterioration of the following conditions:  angioedema, accelerated HTN,    Critical care was time spent personally by me on the following activities:  Development of treatment plan with patient or surrogate, discussions with consultants, evaluation of patient's response to treatment, examination of patient, obtaining history from patient or surrogate, ordering and performing treatments and interventions, ordering and review of laboratory studies and re-evaluation of patient's condition.  I assumed direction of critical care for this patient from another provider in my specialty: no    This document was prepared using Dragon voice recognition software and may include unintentional dictation errors.    Ottie Glazier, M.D.  Division of [facility]

## 2019-08-21 DIAGNOSIS — I499 Cardiac arrhythmia, unspecified: Secondary | ICD-10-CM

## 2019-08-21 DIAGNOSIS — R5381 Other malaise: Secondary | ICD-10-CM

## 2019-08-21 DIAGNOSIS — I4891 Unspecified atrial fibrillation: Secondary | ICD-10-CM

## 2019-08-21 DIAGNOSIS — N179 Acute kidney failure, unspecified: Secondary | ICD-10-CM

## 2019-08-21 DIAGNOSIS — J9601 Acute respiratory failure with hypoxia: Secondary | ICD-10-CM | POA: Diagnosis not present

## 2019-08-21 DIAGNOSIS — I5032 Chronic diastolic (congestive) heart failure: Secondary | ICD-10-CM

## 2019-08-21 DIAGNOSIS — I10 Essential (primary) hypertension: Secondary | ICD-10-CM | POA: Diagnosis not present

## 2019-08-21 LAB — CBC WITH DIFFERENTIAL/PLATELET
Abs Immature Granulocytes: 0.09 10*3/uL — ABNORMAL HIGH (ref 0.00–0.07)
Basophils Absolute: 0 10*3/uL (ref 0.0–0.1)
Basophils Relative: 0 %
Eosinophils Absolute: 0.1 10*3/uL (ref 0.0–0.5)
Eosinophils Relative: 1 %
HCT: 37.4 % (ref 36.0–46.0)
Hemoglobin: 12.1 g/dL (ref 12.0–15.0)
Immature Granulocytes: 1 %
Lymphocytes Relative: 14 %
Lymphs Abs: 1.4 10*3/uL (ref 0.7–4.0)
MCH: 29.7 pg (ref 26.0–34.0)
MCHC: 32.4 g/dL (ref 30.0–36.0)
MCV: 91.9 fL (ref 80.0–100.0)
Monocytes Absolute: 0.8 10*3/uL (ref 0.1–1.0)
Monocytes Relative: 8 %
Neutro Abs: 7.3 10*3/uL (ref 1.7–7.7)
Neutrophils Relative %: 76 %
Platelets: 195 10*3/uL (ref 150–400)
RBC: 4.07 MIL/uL (ref 3.87–5.11)
RDW: 15.7 % — ABNORMAL HIGH (ref 11.5–15.5)
WBC: 9.6 10*3/uL (ref 4.0–10.5)
nRBC: 0 % (ref 0.0–0.2)

## 2019-08-21 LAB — RENAL FUNCTION PANEL
Albumin: 2.3 g/dL — ABNORMAL LOW (ref 3.5–5.0)
Anion gap: 7 (ref 5–15)
BUN: 31 mg/dL — ABNORMAL HIGH (ref 8–23)
CO2: 20 mmol/L — ABNORMAL LOW (ref 22–32)
Calcium: 8.5 mg/dL — ABNORMAL LOW (ref 8.9–10.3)
Chloride: 118 mmol/L — ABNORMAL HIGH (ref 98–111)
Creatinine, Ser: 0.65 mg/dL (ref 0.44–1.00)
GFR calc Af Amer: >60 mL/min (ref >60)
GFR calc non Af Amer: >60 mL/min (ref >60)
Glucose, Bld: 123 mg/dL — ABNORMAL HIGH (ref 70–99)
Phosphorus: 3.4 mg/dL (ref 2.5–4.6)
Potassium: 4.8 mmol/L (ref 3.5–5.1)
Sodium: 145 mmol/L (ref 135–145)

## 2019-08-21 LAB — GLUCOSE, CAPILLARY
Glucose-Capillary: 101 mg/dL — ABNORMAL HIGH (ref 70–99)
Glucose-Capillary: 105 mg/dL — ABNORMAL HIGH (ref 70–99)
Glucose-Capillary: 111 mg/dL — ABNORMAL HIGH (ref 70–99)
Glucose-Capillary: 113 mg/dL — ABNORMAL HIGH (ref 70–99)
Glucose-Capillary: 146 mg/dL — ABNORMAL HIGH (ref 70–99)
Glucose-Capillary: 214 mg/dL — ABNORMAL HIGH (ref 70–99)
Glucose-Capillary: 94 mg/dL (ref 70–99)

## 2019-08-21 LAB — MAGNESIUM: Magnesium: 2.6 mg/dL — ABNORMAL HIGH (ref 1.7–2.4)

## 2019-08-21 LAB — TSH: TSH: 0.014 u[IU]/mL — ABNORMAL LOW (ref 0.350–4.500)

## 2019-08-21 LAB — T4, FREE: Free T4: 1.28 ng/dL — ABNORMAL HIGH (ref 0.61–1.12)

## 2019-08-21 MED ORDER — AMIODARONE HCL 200 MG PO TABS: 100.0000 mg | ORAL_TABLET | Freq: Every day | ORAL | Status: DC

## 2019-08-21 MED ORDER — ATORVASTATIN CALCIUM 10 MG PO TABS
10.0000 mg | ORAL_TABLET | Freq: Every day | ORAL | Status: DC
  Administered 2019-08-21 – 2019-08-28 (×8): 10 mg
  Filled 2019-08-21 (×8): qty 1

## 2019-08-21 MED ORDER — FREE WATER
60.0000 mL | Status: DC
  Administered 2019-08-21 – 2019-08-28 (×41): 60 mL

## 2019-08-21 MED ORDER — SERTRALINE HCL 50 MG PO TABS
25.0000 mg | ORAL_TABLET | Freq: Every day | ORAL | Status: DC
  Administered 2019-08-21 – 2019-08-28 (×8): 25 mg
  Filled 2019-08-21 (×8): qty 1

## 2019-08-21 MED ORDER — PROSOURCE TF PO LIQD
45.0000 mL | Freq: Every day | ORAL | Status: DC
  Administered 2019-08-22 – 2019-08-28 (×7): 45 mL
  Filled 2019-08-21: qty 45

## 2019-08-21 MED ORDER — OSMOLITE 1.5 CAL PO LIQD
1000.0000 mL | ORAL | Status: DC
  Administered 2019-08-21 – 2019-08-25 (×4): 1000 mL

## 2019-08-21 MED ORDER — TORSEMIDE 20 MG PO TABS
20.0000 mg | ORAL_TABLET | Freq: Two times a day (BID) | ORAL | Status: DC
  Administered 2019-08-21 – 2019-08-28 (×15): 20 mg
  Filled 2019-08-21 (×14): qty 1

## 2019-08-21 MED ORDER — DONEPEZIL HCL 5 MG PO TABS
10.0000 mg | ORAL_TABLET | Freq: Every day | ORAL | Status: DC
  Administered 2019-08-21 – 2019-08-27 (×7): 10 mg
  Filled 2019-08-21 (×7): qty 2

## 2019-08-21 MED ORDER — DILTIAZEM HCL 30 MG PO TABS
30.0000 mg | ORAL_TABLET | Freq: Four times a day (QID) | ORAL | Status: DC
  Administered 2019-08-21 – 2019-08-28 (×30): 30 mg
  Filled 2019-08-21 (×30): qty 1

## 2019-08-21 MED ORDER — AMLODIPINE BESYLATE 10 MG PO TABS: 10.0000 mg | ORAL_TABLET | Freq: Every day | ORAL | Status: DC

## 2019-08-21 MED ORDER — MEMANTINE HCL 5 MG PO TABS
5.0000 mg | ORAL_TABLET | Freq: Two times a day (BID) | ORAL | Status: DC
  Administered 2019-08-21 – 2019-08-28 (×15): 5 mg
  Filled 2019-08-21 (×15): qty 1

## 2019-08-21 NOTE — Evaluation (Signed)
Physical Therapy Evaluation Patient Details Name: Melanie Lewis MRN: [id number] DOB: 11-06-1934 Today's Date: 08/21/2019   History of Present Illness  Pt is an 85 y.o. female presenting to hospital 7/16 with lip swelling, tongue swelling, and some neck fullness; pt with SOB and difficulty breathing; intubated in ED emergently.  Pt admitted with severe acute hypoxic and hypercapnic respiratory failure from acute angioedema leading to severe airway obstruction; also AKI/renal failure.  During hospitalization pt found to have a-fib with RVR; pt also noted with metabolic encephalopathy.  PMH includes htn, CAD, CHF, dementia, heart attack, MI, bradycardia, PSVT.  Clinical Impression  Prior to hospital admission, pt was independent with ambulation (occasional use of SPC); lives with her daughter.  Pt resting in bed upon PT arrival; briefly opens eyes with vc's/tactile cues.  Pt's daughter reports pt will blink her eyes and nod her head yes/no at times but has not talked since she was extubated.  Pt did not follow any vc's or tactile cues during session (pt did nod her head 2 separate times appearing to be in agreement to therapists statements to pt).  Pt's daughter agreeable to LE PROM ex's in bed.  Pt became more alert with PROM ex's in bed but then appearing to quickly fall asleep once LE PROM ex's finished.  Pt would benefit from skilled PT to address noted impairments and functional limitations (see below for any additional details).  Upon hospital discharge, pt would benefit from continued rehab pending progress.    Follow Up Recommendations LTACH    Equipment Recommendations  Wheelchair (measurements PT);Wheelchair cushion (measurements PT);Hospital bed;Other (comment) (hoyer lift)    Recommendations for Other Services OT consult     Precautions / Restrictions Precautions Precautions: Fall Precaution Comments: NPO; NG tube; rectal tube Restrictions Weight Bearing Restrictions: No       Mobility  Bed Mobility               General bed mobility comments: Deferred  Transfers                 General transfer comment: Deferred  Ambulation/Gait             General Gait Details: Deferred  Stairs            Wheelchair Mobility    Modified Rankin (Stroke Patients Only)       Balance                                             Pertinent Vitals/Pain   Vitals (HR and O2 on 2 L via nasal cannula) stable and WFL throughout treatment session.    Home Living Family/patient expects to be discharged to:: Private residence Living Arrangements: Children (pt's daughter) Available Help at Discharge: Family;Available PRN/intermittently (daughter works) Type of Home: House Home Access: Level entry     Home Layout: One level Home Equipment: Cane - single point;Shower seat;Bedside commode      Prior Function Level of Independence: Independent         Comments: Pt occasionally using SPC.  No falls in past 6 months.  Pt able to cook and clean; managed medications herself.     Hand Dominance        Extremity/Trunk Assessment        Lower Extremity Assessment Lower Extremity Assessment: Generalized weakness (B LE ROM WFL except R DF  to neutral and L DF to 15 degrees PROM)       Communication   Communication:  (Expressive difficulties)  Cognition Arousal/Alertness: Lethargic Behavior During Therapy: Flat affect Overall Cognitive Status: Impaired/Different from baseline Area of Impairment: Orientation;Attention;Memory;Following commands;Safety/judgement;Awareness;Problem solving                 Orientation Level:  (pt unable to verbalize any of this information) Current Attention Level: Divided   Following Commands: Follows one step commands inconsistently Safety/Judgement:  (pt unable to verbalize) Awareness: Anticipatory Problem Solving: Slow processing;Decreased initiation;Difficulty  sequencing;Requires verbal cues;Requires tactile cues        General Comments   Nursing cleared pt for participation in physical therapy.  Pt's daughter agreeable to PT session.    Exercises General Exercises - Lower Extremity Heel Slides: PROM;Both;10 reps;Supine Hip ABduction/ADduction: PROM;Both;10 reps;Supine Other Exercises Other Exercises: x10 reps B LE PROM hip IR/ER semi-supine in bed Other Exercises: 2x30 seconds B heelcord stretching semi-supine in bed   Assessment/Plan    PT Assessment Patient needs continued PT services  PT Problem List Decreased strength;Decreased range of motion;Decreased activity tolerance;Decreased balance;Decreased mobility;Decreased cognition;Decreased knowledge of use of DME       PT Treatment Interventions DME instruction;Gait training;Stair training;Functional mobility training;Therapeutic activities;Therapeutic exercise;Balance training;Neuromuscular re-education;Patient/family education;Cognitive remediation    PT Goals (Current goals can be found in the Care Plan section)  Acute Rehab PT Goals Patient Stated Goal: to wake up and improve strength and mobility PT Goal Formulation: With family Time For Goal Achievement: 09/04/19 Potential to Achieve Goals: Fair    Frequency Min 2X/week   Barriers to discharge Decreased caregiver support      Co-evaluation               AM-PAC PT "6 Clicks" Mobility  Outcome Measure Help needed turning from your back to your side while in a flat bed without using bedrails?: Total Help needed moving from lying on your back to sitting on the side of a flat bed without using bedrails?: Total Help needed moving to and from a bed to a chair (including a wheelchair)?: Total Help needed standing up from a chair using your arms (e.g., wheelchair or bedside chair)?: Total Help needed to walk in hospital room?: Total Help needed climbing 3-5 steps with a railing? : Total 6 Click Score: 6    End of  Session Equipment Utilized During Treatment: Oxygen Activity Tolerance: Patient limited by lethargy Patient left: in bed;with call bell/phone within reach;with bed alarm set;with family/visitor present;Other (comment) (B heels floating via pillow) Nurse Communication: Mobility status;Precautions PT Visit Diagnosis: Other abnormalities of gait and mobility (R26.89);Muscle weakness (generalized) (M62.81);Difficulty in walking, not elsewhere classified (R26.2)    Time: 2505-3976 PT Time Calculation (min) (ACUTE ONLY): 15 min   Charges:   PT Evaluation $PT Eval Low Complexity: 1 Low PT Treatments $Therapeutic Exercise: 8-22 mins       Hendricks Limes, PT 08/21/19, 6:30 PM

## 2019-08-21 NOTE — Progress Notes (Signed)
Nutrition Follow-up  DOCUMENTATION CODES:   Obesity unspecified  INTERVENTION:   Osmolite 1.5 _0 /hr- Initiate at 57m/hr and increase by 175mhr q 12 hours until goal rate is reached.   Pro-Source 4545maily via tube, provides 40kcal and 11g of protein per serving   Free water flushes 36m76m hours   Regimen provides 1840kcal/day, 86g/day protein and 1274ml43m free water.  Pt at high refeed risk; recommend monitor K, Mg and P labs daily until stable  NUTRITION DIAGNOSIS:   Inadequate oral intake related to inability to eat (pt sedated and ventilated) as evidenced by NPO status. Ongoing.  GOAL:   Patient will meet greater than or equal to 90% of their needs -not met   MONITOR:   Diet advancement, Labs, Weight trends, TF tolerance, Skin, I & O's  REASON FOR ASSESSMENT:   Consult Enteral/tube feeding initiation and management  ASSESSMENT:   84 y/49female with h/o HTN, CHF, CAD, dementia, MI and hyperthyroidism who is admitted with angioedema requiring intubation.   Pt extubated 8/6. Pt with encephalopathy and has been unable to initiate on a diet. NGT placed yesterday; plan is to initiate tube feeds today. Pt is at refeed risk. Per chart, pt has been fairly weight stable since admit. Palliative care following for GOC.  Medications reviewed and include: lovenox, insulin, prednisone   Labs reviewed: BUN 31(H), P 3.4 wnl, Mg 2.6(H) cbgs- 111, 113 x 24 hrs  Diet Order:   Diet Order            Diet NPO time specified  Diet effective now                EDUCATION NEEDS:   No education needs have been identified at this time  Skin:  Skin Assessment: Reviewed RN Assessment (MASD to groin)  Last BM:  8/9- type 6  Height:   Ht Readings from Last 1 Encounters:  08/02/19 5' 2.01" (1.575 m)   Weight:   Wt Readings from Last 1 Encounters:  08/19/19 87.5 kg   Ideal Body Weight:  50 kg  BMI:  Body mass index is 35.27 kg/m.  Estimated Nutritional Needs:    Kcal:  1700-1900kcal/day  Protein:  85-95g/day  Fluid:  1.5 L/day  CaseyKoleen DistanceRD, LDN Please refer to AMION[facility]CRD and/or RD on-call/weekend/after hours pager

## 2019-08-21 NOTE — Care Management Important Message (Signed)
Important Message  Patient Details  Name: LARAYAH CLUTE MRN: [id number] Date of Birth: 11/27/1934   Medicare Important Message Given:  Yes     Milayna Rotenberg, Stephan Minister 08/21/2019, 1:20 PM

## 2019-08-21 NOTE — Progress Notes (Signed)
PHARMACY CONSULT NOTE  Pharmacy Consult for Electrolyte Monitoring and Replacement   Recent Labs: Potassium (mmol/L)  Date Value  08/21/2019 4.8  02/11/2014 4.3   Magnesium (mg/dL)  Date Value  70/17/7939 2.6 (H)  02/07/2014 1.8   Calcium (mg/dL)  Date Value  03/00/9233 8.5 (L)   Calcium, Total (mg/dL)  Date Value  00/76/2263 9.4   Albumin (g/dL)  Date Value  33/54/5625 2.3 (L)  02/11/2014 2.9 (L)   Phosphorus (mg/dL)  Date Value  63/89/3734 3.4   Sodium (mmol/L)  Date Value  08/21/2019 145  09/18/2014 142  02/11/2014 139   Corrected Ca: 10.0 mg/dL   Assessment: 85 year old female admitted to the ICU with angioedema s/t ACE inhibitor use. Angioedema improved, patient with encephalopathy and significant hyperthyroidism.She was extubated on 08/18/19. Pharmacy to manage electrolytes.  Goal of Therapy:  Electrolytes WNL  Plan:   No electrolytes replacement warranted today   Potassium now wnl, magnesium trending down/stable  F/U electrolytes with morning labs (will dc consult if still stable)  Albina Billet ,PharmD Clinical Pharmacist 08/21/2019 7:12 AM

## 2019-08-21 NOTE — Progress Notes (Signed)
PROGRESS NOTE  Melanie Lewis KHT:[id number] DOB: 05/25/1934   PCP: Leonel Ramsay, MD  Patient is from: Home.  DOA: 07/28/2019 LOS: 24  Brief Narrative / Interim history: 85 year old female with history of dementia, paroxysmal SVT, diastolic CHF, CAD, HTN and hypothyroidism admitted to ICU on 07/28/2019 with acute respiratory failure in the setting of severe angioedema from ACE inhibitor's requiring intubation with mechanical ventilation until 08/19/2019.   Hospital course complicated by intermittent A. fib with RVR and acute metabolic encephalopathy.  Cardiology and neurology consulted.  MRI brain negative for acute intracranial finding.  Neurology query "apathic hyperthyroidism".  She was a started on Provigil with minimal improvement in her mental status.  Palliative medicine involved.  Patient was transferred to [facility] hospitalist service on 08/21/2019.  Subjective: Patient went into A. fib with RVR yesterday requiring IV Lopressor and IV Cardizem pushes.  Heart rate improved to 113 earlier this morning.  Now slightly bradycardic to 50s.  She remains encephalopathic only opens her eyes to voice.  Does not follows command.  Resists eye exam.  Objective: Vitals:   08/21/19 0359 08/21/19 0944 08/21/19 0945 08/21/19 1151  BP: (!) 137/96 (!) 150/91 (!) 144/114 (!) 197/117  Pulse: (!) 113 (!) 38 (!) 57 (!) 51  Resp: 17 20  16   Temp: 97.6 F (36.4 C) 98.3 F (36.8 C)  98.6 F (37 C)  TempSrc: Oral Oral    SpO2: 100% 100% 100% 100%  Weight:      Height:        Intake/Output Summary (Last 24 hours) at 08/21/2019 1228 Last data filed at 08/21/2019 0645 Gross per 24 hour  Intake 130 ml  Output 700 ml  Net -570 ml   Filed Weights   08/17/19 0449 08/18/19 0500 08/19/19 0447  Weight: 88.5 kg 87.5 kg 87.5 kg    Examination:  GENERAL: Chronically ill-appearing.  Nontoxic. HEENT: MMM.  NG tube in place.  PERRL.  Hearing grossly intact. NECK: Supple.  No apparent JVD.  RESP:  100% on 2 L.  No IWOB.  Fair aeration bilaterally. CVS:  RRR. Heart sounds normal.  ABD/GI/GU: BS+. Abd soft, NTND.  Rectal tube. MSK/EXT:  No apparent deformity.  Trace edema bilaterally. SKIN: no apparent skin lesion or wound NEURO: Sleepy but wakes to voice and opens eyes.  Does not follows command.  PERRL. Resisting during eye exam.  No facial asymmetry.  Patellar reflex symmetric bilaterally. PSYCH: Calm.  No distress or agitation.  Procedures:  ETT 7/16-8/7  Microbiology summarized: COVID-19 PCR negative. MRSA PCR negative.   Assessment & Plan: Acute respiratory failure with hypoxia in the setting of severe angioedema due to ACE inhibitor -ETT 7/16-8/7.  Now 100% on 2 L by nasal cannula. -Continue steroid taper. -Continue PPI. -Discontinued home lisinopril and added to allergy list  Acute metabolic encephalopathy in patient with history of dementia-remains encephalopathic.  Only opens eyes to voice, and resist eye exam.  MRI brain without acute finding.  Neurology signed off. -Continue monitoring -Treat treatable course above.  A. fib with RVR/history of paroxysmal SVT-on amiodarone, sotalol and Coreg which have been on hold since admission.  RVR resolved.  Now somewhat bradycardic.  TSH 0.014.  Free T4 1.28. -Continue propranolol in the setting of hyperthyroidism -Add Cardizem 30 mg every 6 hours -No amiodarone, sotalol and anticoagulation per cardiology  Hyperthyroidism: TSH 0.014.  Free T4 1.28. -Continue p.o. propranolol -Avoid amiodarone.  Dysphagia -continue NG tube -SLP eval when mental status improves -Consulted dietitian for  tube feed initiation  Chronic diastolic CHF: Echo in 3335 with EF of 65 to 70%, G1 DD and PA PP of 41. Appears euvolemic except some trace edema.  Since she is on Demadex 20 mg twice daily at home. -Start low-dose, Demadex 20 mg daily -Monitor fluid status and renal function  Uncontrolled hypertension: Blood pressure markedly  elevated -Continue propranolol. -Resume home torsemide -Start low-dose Cardizem  AKI: Thought to be due to ATN.  Renal ultrasound reassuring.  Resolved. -Monitor intermittently  CAD-seems stable. -Resume home medications  Goal of care-patient is very sick.  Grim prognosis.  She is currently partial code with no CPR or intubation but NIPPV and ACLS meds.  Palliative medicine involved. -follow palliative medicine recommendations     Body mass index is 35.27 kg/m. Nutrition Problem: Inadequate oral intake Etiology: inability to eat (pt sedated and ventilated) Signs/Symptoms: NPO status Interventions: Tube feeding   DVT prophylaxis:  enoxaparin (LOVENOX) injection 40 mg Start: 08/09/19 1000 SCDs Start: 07/28/19 1541  Code Status: Partial with no CPR/intubation but NIPPV and ACLS meds. Family Communication: Attempted to call patient's daughter for update but no answer.  Did not leave a voicemail. Status is: Inpatient  Remains inpatient appropriate because:Hemodynamically unstable, Altered mental status, Unsafe d/c plan, IV treatments appropriate due to intensity of illness or inability to take PO and Inpatient level of care appropriate due to severity of illness   Dispo: The patient is from: Home              Anticipated d/c is to: To be determined.              Anticipated d/c date is: > 3 days              Patient currently is not medically stable to d/c.       Consultants:  Neurology-signed off Cardiology-not following up Palliative medicine   Sch Meds:  Scheduled Meds: . atorvastatin  10 mg Per Tube Daily  . chlorhexidine gluconate (MEDLINE KIT)  15 mL Mouth Rinse BID  . Chlorhexidine Gluconate Cloth  6 each Topical Daily  . diltiazem  30 mg Per Tube Q6H  . donepezil  10 mg Per Tube QHS  . enoxaparin (LOVENOX) injection  40 mg Subcutaneous Q24H  . insulin aspart  0-20 Units Subcutaneous Q4H  . memantine  5 mg Per Tube BID  . predniSONE  20 mg Oral Q  breakfast  . propranolol  20 mg Per Tube TID  . propylthiouracil  50 mg Per NG tube Q8H  . sertraline  25 mg Per Tube Daily  . sodium chloride flush  3 mL Intravenous Q12H  . torsemide  20 mg Per Tube BID   Continuous Infusions: . sodium chloride 250 mL (08/10/19 1547)  . sodium chloride Stopped (08/11/19 1601)   PRN Meds:.sodium chloride, hydrALAZINE, sodium chloride flush  Antimicrobials: Anti-infectives (From admission, onward)   Start     Dose/Rate Route Frequency Ordered Stop   08/02/19 1200  Ampicillin-Sulbactam (UNASYN) 3 g in sodium chloride 0.9 % 100 mL IVPB  Status:  Discontinued        3 g 200 mL/hr over 30 Minutes Intravenous Every 6 hours 08/02/19 1014 08/07/19 1003       I have personally reviewed the following labs and images: CBC: Recent Labs  Lab 08/17/19 0647 08/18/19 0503 08/19/19 0744 08/20/19 0604 08/21/19 0439  WBC 11.4* 14.5* 12.5* 9.3 9.6  NEUTROABS 9.8* 12.8* 10.1* 7.0 7.3  HGB 12.1 13.2  11.8* 11.8* 12.1  HCT 38.1 38.8 39.1 37.3 37.4  MCV 89.6 87.4 96.8 93.5 91.9  PLT 190 210 119* 157 195   BMP &GFR Recent Labs  Lab 08/17/19 0647 08/18/19 0503 08/19/19 0744 08/20/19 0604 08/21/19 0439  NA 136 138 141 143 145  K 4.0 4.5 5.2* 4.4 4.8  CL 112* 114* 115* 116* 118*  CO2 15* 16* 16* 20* 20*  GLUCOSE 237* 226* 121* 109* 123*  BUN 64* 63* 52* 37* 31*  CREATININE 0.77 0.75  0.83 0.68 0.60 0.65  CALCIUM 8.6* 8.3* 8.1* 8.5* 8.5*  MG 2.8* 2.8* 2.8* 2.6* 2.6*  PHOS 2.8 2.5 3.1 3.1 3.4   Estimated Creatinine Clearance: 55.7 mL/min (by C-G formula based on SCr of 0.65 mg/dL). Liver & Pancreas: Recent Labs  Lab 08/17/19 5615 08/18/19 0503 08/19/19 0744 08/20/19 0604 08/21/19 0439  ALBUMIN 2.1* 2.2* 1.9* 2.1* 2.3*   No results for input(s): LIPASE, AMYLASE in the last 168 hours. No results for input(s): AMMONIA in the last 168 hours. Diabetic: No results for input(s): HGBA1C in the last 72 hours. Recent Labs  Lab 08/20/19 1139  08/20/19 1623 08/20/19 1955 08/20/19 2350 08/21/19 0358  GLUCAP 72 90 94 111* 113*   Cardiac Enzymes: No results for input(s): CKTOTAL, CKMB, CKMBINDEX, TROPONINI in the last 168 hours. No results for input(s): PROBNP in the last 8760 hours. Coagulation Profile: No results for input(s): INR, PROTIME in the last 168 hours. Thyroid Function Tests: Recent Labs    08/21/19 0439  TSH 0.014*  FREET4 1.28*   Lipid Profile: No results for input(s): CHOL, HDL, LDLCALC, TRIG, CHOLHDL, LDLDIRECT in the last 72 hours. Anemia Panel: No results for input(s): VITAMINB12, FOLATE, FERRITIN, TIBC, IRON, RETICCTPCT in the last 72 hours. Urine analysis:    Component Value Date/Time   COLORURINE COLORLESS (A) 06/23/2014 0132   APPEARANCEUR CLEAR (A) 06/23/2014 0132   APPEARANCEUR Clear 02/11/2014 1525   LABSPEC 1.005 06/23/2014 0132   LABSPEC 1.009 02/11/2014 1525   PHURINE 7.0 06/23/2014 0132   GLUCOSEU 50 (A) 06/23/2014 0132   GLUCOSEU Negative 02/11/2014 1525   HGBUR NEGATIVE 06/23/2014 0132   BILIRUBINUR NEGATIVE 06/23/2014 0132   BILIRUBINUR Negative 02/11/2014 1525   KETONESUR NEGATIVE 06/23/2014 0132   PROTEINUR NEGATIVE 06/23/2014 0132   NITRITE NEGATIVE 06/23/2014 0132   LEUKOCYTESUR NEGATIVE 06/23/2014 0132   LEUKOCYTESUR Negative 02/11/2014 1525   Sepsis Labs: Invalid input(s): PROCALCITONIN, [place]  Microbiology: No results found for this or any previous visit (from the past 240 hour(s)).  Radiology Studies: DG Abd 1 View  Result Date: 08/20/2019 CLINICAL DATA:  Nasogastric tube placement. EXAM: ABDOMEN - 1 VIEW COMPARISON:  August 13, 2019 FINDINGS: A nasogastric tube is seen with its distal tip overlying the body of the stomach. A dilated loop of small bowel is seen overlying the right lower quadrant. No radio-opaque calculi or other significant radiographic abnormality are seen. Radiopaque surgical clips are seen overlying the right upper quadrant. IMPRESSION: 1.  Nasogastric tube positioning, as described above. 2. Dilated small bowel loop overlying the right lower quadrant. Sequelae associated with an early partial small bowel obstruction versus early ileus cannot be excluded. Electronically Signed   By: Virgina Norfolk M.D.   On: 08/20/2019 23:44     [facility] [place]  If 7PM-7AM, please contact night-coverage [email or web address] Password [facility] 08/21/2019, 12:28 PM

## 2019-08-21 NOTE — Progress Notes (Signed)
Ok to [place] NG tube for medications because placement via x-ray has been verified  per Luci Bank NP

## 2019-08-22 DIAGNOSIS — J9601 Acute respiratory failure with hypoxia: Secondary | ICD-10-CM | POA: Diagnosis not present

## 2019-08-22 DIAGNOSIS — Z515 Encounter for palliative care: Secondary | ICD-10-CM | POA: Diagnosis not present

## 2019-08-22 DIAGNOSIS — I10 Essential (primary) hypertension: Secondary | ICD-10-CM | POA: Diagnosis not present

## 2019-08-22 DIAGNOSIS — Z7189 Other specified counseling: Secondary | ICD-10-CM | POA: Diagnosis not present

## 2019-08-22 DIAGNOSIS — I4891 Unspecified atrial fibrillation: Secondary | ICD-10-CM | POA: Diagnosis not present

## 2019-08-22 DIAGNOSIS — R5381 Other malaise: Secondary | ICD-10-CM | POA: Diagnosis not present

## 2019-08-22 LAB — CBC WITH DIFFERENTIAL/PLATELET
Abs Immature Granulocytes: 0.09 10*3/uL — ABNORMAL HIGH (ref 0.00–0.07)
Basophils Absolute: 0 10*3/uL (ref 0.0–0.1)
Basophils Relative: 0 %
Eosinophils Absolute: 0.1 10*3/uL (ref 0.0–0.5)
Eosinophils Relative: 1 %
HCT: 39.1 % (ref 36.0–46.0)
Hemoglobin: 12 g/dL (ref 12.0–15.0)
Immature Granulocytes: 1 %
Lymphocytes Relative: 12 %
Lymphs Abs: 1.2 10*3/uL (ref 0.7–4.0)
MCH: 29.5 pg (ref 26.0–34.0)
MCHC: 30.7 g/dL (ref 30.0–36.0)
MCV: 96.1 fL (ref 80.0–100.0)
Monocytes Absolute: 0.9 10*3/uL (ref 0.1–1.0)
Monocytes Relative: 9 %
Neutro Abs: 7.6 10*3/uL (ref 1.7–7.7)
Neutrophils Relative %: 77 %
Platelets: 154 10*3/uL (ref 150–400)
RBC: 4.07 MIL/uL (ref 3.87–5.11)
RDW: 15.3 % (ref 11.5–15.5)
WBC: 9.8 10*3/uL (ref 4.0–10.5)
nRBC: 0 % (ref 0.0–0.2)

## 2019-08-22 LAB — GLUCOSE, CAPILLARY
Glucose-Capillary: 137 mg/dL — ABNORMAL HIGH (ref 70–99)
Glucose-Capillary: 162 mg/dL — ABNORMAL HIGH (ref 70–99)
Glucose-Capillary: 167 mg/dL — ABNORMAL HIGH (ref 70–99)
Glucose-Capillary: 189 mg/dL — ABNORMAL HIGH (ref 70–99)
Glucose-Capillary: 206 mg/dL — ABNORMAL HIGH (ref 70–99)
Glucose-Capillary: 229 mg/dL — ABNORMAL HIGH (ref 70–99)

## 2019-08-22 LAB — RENAL FUNCTION PANEL
Albumin: 2.1 g/dL — ABNORMAL LOW (ref 3.5–5.0)
Anion gap: 6 (ref 5–15)
BUN: 33 mg/dL — ABNORMAL HIGH (ref 8–23)
CO2: 21 mmol/L — ABNORMAL LOW (ref 22–32)
Calcium: 8.3 mg/dL — ABNORMAL LOW (ref 8.9–10.3)
Chloride: 116 mmol/L — ABNORMAL HIGH (ref 98–111)
Creatinine, Ser: 0.77 mg/dL (ref 0.44–1.00)
GFR calc Af Amer: >60 mL/min (ref >60)
GFR calc non Af Amer: >60 mL/min (ref >60)
Glucose, Bld: 157 mg/dL — ABNORMAL HIGH (ref 70–99)
Phosphorus: 2.7 mg/dL (ref 2.5–4.6)
Potassium: 5.1 mmol/L (ref 3.5–5.1)
Sodium: 143 mmol/L (ref 135–145)

## 2019-08-22 LAB — T3: T3, Total: 31 ng/dL — ABNORMAL LOW (ref 71–180)

## 2019-08-22 LAB — MAGNESIUM: Magnesium: 2.3 mg/dL (ref 1.7–2.4)

## 2019-08-22 LAB — T3, FREE: T3, Free: 0.9 pg/mL — ABNORMAL LOW (ref 2.0–4.4)

## 2019-08-22 LAB — T4: T4, Total: 5.3 ug/dL (ref 4.5–12.0)

## 2019-08-22 NOTE — Progress Notes (Signed)
PHARMACY CONSULT NOTE  Pharmacy Consult for Electrolyte Monitoring and Replacement   Recent Labs: Potassium (mmol/L)  Date Value  08/22/2019 5.1  02/11/2014 4.3   Magnesium (mg/dL)  Date Value  53/97/6734 2.3  02/07/2014 1.8   Calcium (mg/dL)  Date Value  19/37/9024 8.3 (L)   Calcium, Total (mg/dL)  Date Value  09/73/5329 9.4   Albumin (g/dL)  Date Value  92/42/6834 2.1 (L)  02/11/2014 2.9 (L)   Phosphorus (mg/dL)  Date Value  19/62/2297 2.7   Sodium (mmol/L)  Date Value  08/22/2019 143  09/18/2014 142  02/11/2014 139   Corrected Ca: 10.0 mg/dL   Assessment: 85 year old female admitted to the ICU with angioedema s/t ACE inhibitor use. Angioedema improved, patient with encephalopathy and significant hyperthyroidism.She was extubated on 08/18/19. Pharmacy to manage electrolytes.  Goal of Therapy:  Electrolytes WNL  Plan:   No electrolytes replacement warranted today   Potassium  wnl's (but trending up - new torsemide will likely stabilize)   Magnesium wnl's (trending down/stable)  F/U electrolytes with morning labs (will dc consult if still stable)  Albina Billet ,PharmD Clinical Pharmacist 08/22/2019 7:10 AM

## 2019-08-22 NOTE — Progress Notes (Signed)
Daily Progress Note   Patient Name: Melanie Lewis       Date: 08/22/2019 DOB: 1934-11-29  Age: 85 y.o. MRN#: [id number] Attending Physician: Mercy Riding, MD Primary Care Physician: Leonel Ramsay, MD Admit Date: 07/28/2019  Reason for Consultation/Follow-up: Psychosocial/spiritual support  Subjective: Patient is resting in bed with feeding tube to nose. Daughter is at bedside and states patient has not spoken. Current plans are to move patient to [facility]. Discussed neccessary oral intake to sustain without a feeding tube, and PEG placement. OT entered to work with patient. Discussed splinting.  Recommend palliative to follow there for further [place] conversation.     Length of Stay: 25  Current Medications: Scheduled Meds:   atorvastatin  10 mg Per Tube Daily   chlorhexidine gluconate (MEDLINE KIT)  15 mL Mouth Rinse BID   Chlorhexidine Gluconate Cloth  6 each Topical Daily   diltiazem  30 mg Per Tube Q6H   donepezil  10 mg Per Tube QHS   enoxaparin (LOVENOX) injection  40 mg Subcutaneous Q24H   feeding supplement (PROSource TF)  45 mL Per Tube Daily   free water  60 mL Per Tube Q4H   insulin aspart  0-20 Units Subcutaneous Q4H   memantine  5 mg Per Tube BID   predniSONE  20 mg Oral Q breakfast   propranolol  20 mg Per Tube TID   propylthiouracil  50 mg Per NG tube Q8H   sertraline  25 mg Per Tube Daily   sodium chloride flush  3 mL Intravenous Q12H   torsemide  20 mg Per Tube BID    Continuous Infusions:  sodium chloride 250 mL (08/10/19 1547)   sodium chloride Stopped (08/11/19 1601)   feeding supplement (OSMOLITE 1.5 CAL) 40 mL/hr at 08/22/19 0602    PRN Meds: sodium chloride, hydrALAZINE, sodium chloride flush  Physical Exam Skin:     General: Skin is warm and dry.  Neurological:     Mental Status: She is alert.             Vital Signs: BP 134/79 (BP Location: Left Arm)    Pulse 60 Comment: per RN   Temp 97.7 F (36.5 C) (Oral)    Resp 17    Ht 5' 2.01" (1.575 m)    Wt 87.5 kg  LMP  (LMP Unknown)    SpO2 100%    BMI 35.27 kg/m  SpO2: SpO2: 100 % O2 Device: O2 Device: Nasal Cannula O2 Flow Rate: O2 Flow Rate (L/min): 2 L/min  Intake/output summary:   Intake/Output Summary (Last 24 hours) at 08/22/2019 1216 Last data filed at 08/22/2019 0500 Gross per 24 hour  Intake 619.67 ml  Output 2150 ml  Net -1530.33 ml   LBM: Last BM Date: 08/21/19 Baseline Weight: Weight: 93 kg Most recent weight: Weight: 87.5 kg       Palliative Assessment/Data:      Patient Active Problem List   Diagnosis Date Noted   Angioedema 07/28/2019   Hyperthyroidism 04/20/2017   Coronary artery disease of native artery of native heart with stable angina pectoris ([place]) 04/20/2017   Pure hypercholesterolemia 04/20/2017   Dementia without behavioral disturbance ([place]) 04/20/2017   Osteopenia of lumbar spine 04/20/2017   History of non-ST elevation myocardial infarction (NSTEMI) 08/14/2014   Chronic diastolic heart failure ([place]) 06/29/2014   Paroxysmal supraventricular tachycardia ([place]) 06/23/2014   Essential hypertension 04/19/2014   Bradycardia 04/19/2014    Palliative Care Assessment & Plan   Recommendations/Plan: Plans for patient to move to [facility].  Family aware of possible need for permanent feeding tube to sustain life and will discuss the decision amongst themselves.  Recommend palliative to follow at D/C.     Code Status:    Code Status Orders  (From admission, onward)         Start     Ordered   08/15/19 1450  Limited resuscitation (code)  Continuous       Question Answer Comment  In the event of cardiac or respiratory ARREST: Initiate Code Blue, Call Rapid Response Yes   In the event of cardiac or  respiratory ARREST: Perform CPR No   In the event of cardiac or respiratory ARREST: Perform Intubation/Mechanical Ventilation No   In the event of cardiac or respiratory ARREST: Use NIPPV/BiPAp only if indicated Yes   In the event of cardiac or respiratory ARREST: Administer ACLS medications if indicated Yes   In the event of cardiac or respiratory ARREST: Perform Defibrillation or Cardioversion if indicated No   Comments No defibrillation or CPR. No reintubation/trach following this extubation which should occur when medically optimized.      08/15/19 1451        Code Status History    Date Active Date Inactive Code Status Order ID Comments User Context   07/28/2019 1542 08/15/2019 1451 Full Code [id number]  Flora Lipps, MD ED   08/14/2014 1415 08/15/2014 1531 Full Code [id number]  Isaias Cowman, MD Inpatient   08/13/2014 2206 08/14/2014 1415 Full Code [id number]  Lytle Butte, MD ED   06/23/2014 0527 06/24/2014 1617 Full Code [id number]  Juluis Mire, MD Inpatient   Advance Care Planning Activity      Prognosis:  Poor overall    Thank you for allowing the Palliative Medicine Team to assist in the care of this patient.   Total Time 25 min Prolonged Time Billed  no      Greater than 50%  of this time was spent counseling and coordinating care related to the above assessment and plan.  Asencion Gowda, NP  Please contact Palliative Medicine Team phone at [phone] for questions and concerns.

## 2019-08-22 NOTE — Progress Notes (Signed)
PROGRESS NOTE  Melanie Lewis FFM:[id number] DOB: [birth year]   PCP: Leonel Ramsay, MD  Patient is from: Home.  DOA: 07/28/2019 LOS: 25  Brief Narrative / Interim history: 85 year old female with history of dementia, paroxysmal SVT, diastolic CHF, CAD, HTN and hypothyroidism admitted to ICU on 07/28/2019 with acute respiratory failure in the setting of severe angioedema from ACE inhibitor's requiring intubation with mechanical ventilation until 08/19/2019.   Hospital course complicated by intermittent A. fib with RVR and acute metabolic encephalopathy.  Cardiology and neurology consulted.  MRI brain negative for acute intracranial finding.  Neurology query "apathic hyperthyroidism".  She was a started on Provigil with minimal improvement in her mental status.  Palliative medicine involved.  Patient was transferred to [facility] hospitalist service on 08/21/2019.  Remains encephalopathic.  On tube feed via an NG tube.   Subjective: Seen and examined earlier this morning.  No major events overnight of this morning.  She remains encephalopathic.  Only opens her eyes or respond to voice.  Does not follows command.  Does not appear to be in distress.  Objective: Vitals:   08/21/19 1527 08/21/19 2321 08/22/19 0757 08/22/19 0757  BP: (!) 159/94 137/77 134/79   Pulse: 81 84 60 60  Resp: 16 20 17    Temp:  (!) 97.5 F (36.4 C) 97.7 F (36.5 C)   TempSrc:  Oral Oral   SpO2: 100% 100% 100% 100%  Weight:      Height:        Intake/Output Summary (Last 24 hours) at 08/22/2019 1312 Last data filed at 08/22/2019 0500 Gross per 24 hour  Intake 619.67 ml  Output 2150 ml  Net -1530.33 ml   Filed Weights   08/17/19 0449 08/18/19 0500 08/19/19 0447  Weight: 88.5 kg 87.5 kg 87.5 kg    Examination:  GENERAL: Chronically ill-appearing.  No apparent distress. HEENT: MMM.  NG tube in place.  PERRL.  Hearing grossly intact. NECK: Supple.  No apparent JVD.  RESP: On 2 L.  No IWOB.  Fair aeration  bilaterally. CVS:  RRR. Heart sounds normal.  ABD/GI/GU: BS+. Abd soft, NTND.  Rectal tube and indwelling Foley. MSK/EXT: No apparent deformity.  Trace edema bilaterally.  Does not move extremities. SKIN: no apparent skin lesion or wound NEURO: Sleepy but wakes to voice easily.  Opens eyes.  Does not follows command.  PERRL.  Light reflex symmetric.  No facial asymmetry.  Further exam limited by patient's mental status PSYCH: Calm.  No distress or agitation.  Procedures:  ETT 7/16-8/7  Microbiology summarized: COVID-19 PCR negative. MRSA PCR negative.   Assessment & Plan: Acute respiratory failure with hypoxia in the setting of severe angioedema due to ACE inhibitor -ETT 7/16-8/7.  Now 100% on 2 L by nasal cannula. -Continue steroid taper. -Continue PPI. -Discontinued home lisinopril and added to allergy list  Acute metabolic encephalopathy in patient with history of dementia-remains encephalopathic.  Only opens eyes to voice, and resist eye exam.  MRI brain without acute finding.  Neurology signed off. -Continue monitoring -Treat treatable course above.  A. fib with RVR/history of paroxysmal SVT-on amiodarone, sotalol and Coreg which have been on hold since admission.  RVR resolved.  TSH 0.014.  Free T4 1.28. -Continue propranolol in the setting of hyperthyroidism -Added Cardizem 30 mg every 6 hours -No amiodarone, sotalol and anticoagulation per cardiology  Hyperthyroidism: TSH 0.014.  Free T4 1.28. -Continue p.o. propranolol -Avoid amiodarone.  Dysphagia -Continue NG tube feeding.  She may need G-tube if  family wish to continue current scope of care. -SLP eval when mental status improves  Chronic diastolic CHF: Echo in 4287 with EF of 65 to 70%, G1 DD and PA PP of 41. Appears euvolemic except some trace edema.  Since she is on Demadex 20 mg twice daily at home.  Excellent urine output on Demadex.  Renal function stable. -Continue Demadex 20 mg daily -Monitor fluid status  and renal function  Uncontrolled hypertension: Now normotensive. -Continue propranolol, Cardizem and torsemide  AKI: Thought to be due to ATN.  Renal ultrasound reassuring.  Resolved. -Monitor intermittently  CAD-seems stable. -Resume home medications  Goal of care-patient is very sick.  Grim prognosis.  She is currently partial code with no CPR or intubation but NIPPV and ACLS meds.  Palliative medicine involved.  I was not able to reach patient's daughter, Renea Ee over the phone to discuss further care plan.  -follow palliative medicine recommendations     Body mass index is 35.27 kg/m. Nutrition Problem: Inadequate oral intake Etiology: inability to eat (pt sedated and ventilated) Signs/Symptoms: NPO status Interventions: Tube feeding   DVT prophylaxis:  enoxaparin (LOVENOX) injection 40 mg Start: 08/09/19 1000 SCDs Start: 07/28/19 1541  Code Status: Partial with no CPR/intubation but NIPPV and ACLS meds. Family Communication: Attempted to call patient's daughter for update on 8/9 and 8/10 but no answer.  Did not leave a voicemail on generic voicemail.  Status is: Inpatient  Remains inpatient appropriate because:Hemodynamically unstable, Altered mental status, Unsafe d/c plan, IV treatments appropriate due to intensity of illness or inability to take PO and Inpatient level of care appropriate due to severity of illness   Dispo: The patient is from: Home              Anticipated d/c is to: LTAC or SNF              Anticipated d/c date is: > 3 days              Patient currently is not medically stable to d/c.       Consultants:  Neurology-signed off Cardiology-not following up Palliative medicine   Sch Meds:  Scheduled Meds: . atorvastatin  10 mg Per Tube Daily  . chlorhexidine gluconate (MEDLINE KIT)  15 mL Mouth Rinse BID  . Chlorhexidine Gluconate Cloth  6 each Topical Daily  . diltiazem  30 mg Per Tube Q6H  . donepezil  10 mg Per Tube QHS  .  enoxaparin (LOVENOX) injection  40 mg Subcutaneous Q24H  . feeding supplement (PROSource TF)  45 mL Per Tube Daily  . free water  60 mL Per Tube Q4H  . insulin aspart  0-20 Units Subcutaneous Q4H  . memantine  5 mg Per Tube BID  . predniSONE  20 mg Oral Q breakfast  . propranolol  20 mg Per Tube TID  . propylthiouracil  50 mg Per NG tube Q8H  . sertraline  25 mg Per Tube Daily  . sodium chloride flush  3 mL Intravenous Q12H  . torsemide  20 mg Per Tube BID   Continuous Infusions: . sodium chloride 250 mL (08/10/19 1547)  . sodium chloride Stopped (08/11/19 1601)  . feeding supplement (OSMOLITE 1.5 CAL) 40 mL/hr at 08/22/19 0602   PRN Meds:.sodium chloride, hydrALAZINE, sodium chloride flush  Antimicrobials: Anti-infectives (From admission, onward)   Start     Dose/Rate Route Frequency Ordered Stop   08/02/19 1200  Ampicillin-Sulbactam (UNASYN) 3 g in sodium chloride 0.9 %  100 mL IVPB  Status:  Discontinued        3 g 200 mL/hr over 30 Minutes Intravenous Every 6 hours 08/02/19 1014 08/07/19 1003       I have personally reviewed the following labs and images: CBC: Recent Labs  Lab 08/18/19 0503 08/19/19 0744 08/20/19 0604 08/21/19 0439 08/22/19 0602  WBC 14.5* 12.5* 9.3 9.6 9.8  NEUTROABS 12.8* 10.1* 7.0 7.3 7.6  HGB 13.2 11.8* 11.8* 12.1 12.0  HCT 38.8 39.1 37.3 37.4 39.1  MCV 87.4 96.8 93.5 91.9 96.1  PLT 210 119* 157 195 154   BMP &GFR Recent Labs  Lab 08/18/19 0503 08/19/19 0744 08/20/19 0604 08/21/19 0439 08/22/19 0602  NA 138 141 143 145 143  K 4.5 5.2* 4.4 4.8 5.1  CL 114* 115* 116* 118* 116*  CO2 16* 16* 20* 20* 21*  GLUCOSE 226* 121* 109* 123* 157*  BUN 63* 52* 37* 31* 33*  CREATININE 0.75  0.83 0.68 0.60 0.65 0.77  CALCIUM 8.3* 8.1* 8.5* 8.5* 8.3*  MG 2.8* 2.8* 2.6* 2.6* 2.3  PHOS 2.5 3.1 3.1 3.4 2.7   Estimated Creatinine Clearance: 55.7 mL/min (by C-G formula based on SCr of 0.77 mg/dL). Liver & Pancreas: Recent Labs  Lab 08/18/19 0503  08/19/19 0744 08/20/19 0604 08/21/19 0439 08/22/19 0602  ALBUMIN 2.2* 1.9* 2.1* 2.3* 2.1*   No results for input(s): LIPASE, AMYLASE in the last 168 hours. No results for input(s): AMMONIA in the last 168 hours. Diabetic: No results for input(s): HGBA1C in the last 72 hours. Recent Labs  Lab 08/21/19 2106 08/21/19 2356 08/22/19 0349 08/22/19 0824 08/22/19 1201  GLUCAP 146* 214* 137* 162* 167*   Cardiac Enzymes: No results for input(s): CKTOTAL, CKMB, CKMBINDEX, TROPONINI in the last 168 hours. No results for input(s): PROBNP in the last 8760 hours. Coagulation Profile: No results for input(s): INR, PROTIME in the last 168 hours. Thyroid Function Tests: Recent Labs    08/21/19 0439  TSH 0.014*  T4TOTAL 5.3  FREET4 1.28*  T3FREE 0.9*   Lipid Profile: No results for input(s): CHOL, HDL, LDLCALC, TRIG, CHOLHDL, LDLDIRECT in the last 72 hours. Anemia Panel: No results for input(s): VITAMINB12, FOLATE, FERRITIN, TIBC, IRON, RETICCTPCT in the last 72 hours. Urine analysis:    Component Value Date/Time   COLORURINE COLORLESS (A) 06/23/2014 0132   APPEARANCEUR CLEAR (A) 06/23/2014 0132   APPEARANCEUR Clear 02/11/2014 1525   LABSPEC 1.005 06/23/2014 0132   LABSPEC 1.009 02/11/2014 1525   PHURINE 7.0 06/23/2014 0132   GLUCOSEU 50 (A) 06/23/2014 0132   GLUCOSEU Negative 02/11/2014 1525   HGBUR NEGATIVE 06/23/2014 0132   BILIRUBINUR NEGATIVE 06/23/2014 0132   BILIRUBINUR Negative 02/11/2014 1525   KETONESUR NEGATIVE 06/23/2014 0132   PROTEINUR NEGATIVE 06/23/2014 0132   NITRITE NEGATIVE 06/23/2014 0132   LEUKOCYTESUR NEGATIVE 06/23/2014 0132   LEUKOCYTESUR Negative 02/11/2014 1525   Sepsis Labs: Invalid input(s): PROCALCITONIN, [place]  Microbiology: No results found for this or any previous visit (from the past 240 hour(s)).  Radiology Studies: No results found.   Gianny Killman T. Morrill  If 7PM-7AM, please contact  night-coverage [email or web address] Password [email or web address] 08/22/2019, 1:12 PM

## 2019-08-22 NOTE — Progress Notes (Addendum)
Occupational Therapy Treatment Patient Details Name: Melanie Lewis MRN: [id number] DOB: Nov 02, 1934 Today's Date: 08/22/2019    History of present illness Pt is an 85 y.o. female presenting to hospital 7/16 with lip swelling, tongue swelling, and some neck fullness; pt with SOB and difficulty breathing; intubated in ED emergently.  Pt admitted with severe acute hypoxic and hypercapnic respiratory failure from acute angioedema leading to severe airway obstruction; also AKI/renal failure.  During hospitalization pt found to have a-fib with RVR; pt also noted with metabolic encephalopathy.  PMH includes htn, CAD, CHF, dementia, heart attack, MI, bradycardia, PSVT.   OT comments  Pt seen for OT tx this date to f/u with family re: PROM of UEs to assist in maintaining joint integrity. Pt's daughter demos good understanding of safe PROM of shlds, elbow, wrist and digits in all available planes to pt's tolerance. Pt with slightly better attention/interaction during OT session this date. Will continue to follow. Anticipate SNF versus LTACH would be safest d/c recommendation.    Follow Up Recommendations  SNF;Supervision/Assistance - 24 hour;LTACH    Equipment Recommendations  Other (comment) (defer to next venue of care)    Recommendations for Other Services      Precautions / Restrictions Precautions Precautions: Fall Precaution Comments: NPO; NG tube; rectal tube       Mobility Bed Mobility               General bed mobility comments: Deferred  Transfers                 General transfer comment: Deferred    Balance                                           ADL either performed or assessed with clinical judgement   ADL                                               Vision       Perception     Praxis      Cognition Arousal/Alertness: Lethargic Behavior During Therapy: Flat affect Overall Cognitive Status:  Impaired/Different from baseline Area of Impairment: Orientation;Attention;Memory;Following commands;Safety/judgement;Awareness;Problem solving                 Orientation Level:  (unable to verbalize) Current Attention Level: Divided   Following Commands: Follows one step commands inconsistently Safety/Judgement:  (unable to verbalize) Awareness: Anticipatory Problem Solving: Slow processing;Decreased initiation;Difficulty sequencing;Requires verbal cues;Requires tactile cues General Comments: Pt with improved visual attention this date, turns head to look at OT 5x when name is called, nods head to yes/no questions appropriately 4x        Exercises Other Exercises Other Exercises: OT facilitates hands on demonstration with pt's daughter who is present including teach back method re: PROM of UEs with dtr demonstrating good understanding.   Shoulder Instructions       General Comments      Pertinent Vitals/ Pain       Pain Assessment: Faces Faces Pain Scale: No hurt  Home Living  Prior Functioning/Environment              Frequency  Min 1X/week        Progress Toward Goals  OT Goals(current goals can now be found in the care plan section)  Progress towards OT goals: OT to reassess next treatment  Acute Rehab OT Goals Patient Stated Goal: to wake up and improve strength and mobility OT Goal Formulation: With family Time For Goal Achievement: 09/03/19 Potential to Achieve Goals: Fair  Plan      Co-evaluation                 AM-PAC OT "6 Clicks" Daily Activity     Outcome Measure   Help from another person eating meals?: Total Help from another person taking care of personal grooming?: Total Help from another person toileting, which includes using toliet, bedpan, or urinal?: Total Help from another person bathing (including washing, rinsing, drying)?: Total Help from another person to  put on and taking off regular upper body clothing?: Total Help from another person to put on and taking off regular lower body clothing?: Total 6 Click Score: 6    End of Session    OT Visit Diagnosis: Other abnormalities of gait and mobility (R26.89);Muscle weakness (generalized) (M62.81)   Activity Tolerance Other (comment) (cognition)   Patient Left in bed;with call bell/phone within reach;with bed alarm set;with family/visitor present   Nurse Communication          Time: 5027-7412 OT Time Calculation (min): 17 min  Charges: OT General Charges $OT Visit: 1 Visit OT Treatments $Self Care/Home Management : 8-22 mins  Rejeana Brock, MS, OTR/L ascom [phone] 08/22/19, 4:22 PM

## 2019-08-23 DIAGNOSIS — T783XXD Angioneurotic edema, subsequent encounter: Secondary | ICD-10-CM

## 2019-08-23 LAB — CBC WITH DIFFERENTIAL/PLATELET
Abs Immature Granulocytes: 0.17 10*3/uL — ABNORMAL HIGH (ref 0.00–0.07)
Basophils Absolute: 0 10*3/uL (ref 0.0–0.1)
Basophils Relative: 0 %
Eosinophils Absolute: 0 10*3/uL (ref 0.0–0.5)
Eosinophils Relative: 0 %
HCT: 34.9 % — ABNORMAL LOW (ref 36.0–46.0)
Hemoglobin: 10.9 g/dL — ABNORMAL LOW (ref 12.0–15.0)
Immature Granulocytes: 2 %
Lymphocytes Relative: 12 %
Lymphs Abs: 1.4 10*3/uL (ref 0.7–4.0)
MCH: 29.5 pg (ref 26.0–34.0)
MCHC: 31.2 g/dL (ref 30.0–36.0)
MCV: 94.3 fL (ref 80.0–100.0)
Monocytes Absolute: 1.1 10*3/uL — ABNORMAL HIGH (ref 0.1–1.0)
Monocytes Relative: 10 %
Neutro Abs: 8.4 10*3/uL — ABNORMAL HIGH (ref 1.7–7.7)
Neutrophils Relative %: 76 %
Platelets: 185 10*3/uL (ref 150–400)
RBC: 3.7 MIL/uL — ABNORMAL LOW (ref 3.87–5.11)
RDW: 15.2 % (ref 11.5–15.5)
WBC: 11.1 10*3/uL — ABNORMAL HIGH (ref 4.0–10.5)
nRBC: 0 % (ref 0.0–0.2)

## 2019-08-23 LAB — GLUCOSE, CAPILLARY
Glucose-Capillary: 159 mg/dL — ABNORMAL HIGH (ref 70–99)
Glucose-Capillary: 160 mg/dL — ABNORMAL HIGH (ref 70–99)
Glucose-Capillary: 187 mg/dL — ABNORMAL HIGH (ref 70–99)
Glucose-Capillary: 189 mg/dL — ABNORMAL HIGH (ref 70–99)
Glucose-Capillary: 187 mg/dL — ABNORMAL HIGH (ref 70–99)

## 2019-08-23 LAB — RENAL FUNCTION PANEL
Albumin: 2.2 g/dL — ABNORMAL LOW (ref 3.5–5.0)
Anion gap: 7 (ref 5–15)
BUN: 33 mg/dL — ABNORMAL HIGH (ref 8–23)
CO2: 23 mmol/L (ref 22–32)
Calcium: 8.5 mg/dL — ABNORMAL LOW (ref 8.9–10.3)
Chloride: 114 mmol/L — ABNORMAL HIGH (ref 98–111)
Creatinine, Ser: 0.75 mg/dL (ref 0.44–1.00)
GFR calc Af Amer: >60 mL/min (ref >60)
GFR calc non Af Amer: >60 mL/min (ref >60)
Glucose, Bld: 172 mg/dL — ABNORMAL HIGH (ref 70–99)
Phosphorus: 2 mg/dL — ABNORMAL LOW (ref 2.5–4.6)
Potassium: 4.5 mmol/L (ref 3.5–5.1)
Sodium: 144 mmol/L (ref 135–145)

## 2019-08-23 LAB — MAGNESIUM: Magnesium: 1.8 mg/dL (ref 1.7–2.4)

## 2019-08-23 MED ORDER — MAGNESIUM SULFATE 2 GM/50ML IV SOLN
2.0000 g | Freq: Once | INTRAVENOUS | Status: AC
  Administered 2019-08-23: 2 g via INTRAVENOUS
  Filled 2019-08-23: qty 50

## 2019-08-23 MED ORDER — SODIUM PHOSPHATES 45 MMOLE/15ML IV SOLN
10.0000 mmol | Freq: Once | INTRAVENOUS | Status: AC
  Administered 2019-08-23: 10 mmol via INTRAVENOUS
  Filled 2019-08-23: qty 3.33

## 2019-08-23 NOTE — Progress Notes (Signed)
PHARMACY CONSULT NOTE  Pharmacy Consult for Electrolyte Monitoring and Replacement   Recent Labs: Potassium (mmol/L)  Date Value  08/23/2019 4.5  02/11/2014 4.3   Magnesium (mg/dL)  Date Value  88/91/6945 1.8  02/07/2014 1.8   Calcium (mg/dL)  Date Value  03/88/8280 8.5 (L)   Calcium, Total (mg/dL)  Date Value  03/49/1791 9.4   Albumin (g/dL)  Date Value  50/56/9794 2.2 (L)  02/11/2014 2.9 (L)   Phosphorus (mg/dL)  Date Value  80/16/5537 2.0 (L)   Sodium (mmol/L)  Date Value  08/23/2019 144  09/18/2014 142  02/11/2014 139   Corrected Ca: 10.0 mg/dL   Assessment: 85 year old female admitted to the ICU with angioedema s/t ACE inhibitor use. Angioedema improved, patient with encephalopathy and significant hyperthyroidism.She was extubated on 08/18/19. Pharmacy to manage electrolytes.  Goal of Therapy:  Electrolytes WNL  Plan:   Patient is at risk for refeeding.   Potassium  wnl's (but at upper limits - new torsemide will likely stabilize)   Magnesium wnl's (trending down) - will replace with Magnesium Sulfate 2g IV times 1  Phosphorus 2.0 - will replace with Sodium Phosphate IV times 1 F/U electrolytes with morning labs   Clovia Cuff, PharmD, BCPS 08/23/2019 4:47 PM

## 2019-08-23 NOTE — Progress Notes (Signed)
OT Cancellation Note  Patient Details Name: Melanie Lewis MRN: [id number] DOB: 08-24-1934   Cancelled Treatment:    Reason Eval/Treat Not Completed: Fatigue/lethargy limiting ability to participate;Other (comment). OT continues to follow pt, on this date spoke with pt physical therapist who stated pt was lethargic, unable to follow VCs. Will hold OT tx at this time and initiate services as available and pt medically appropriate for therapy session.   Rockney Ghee, M.S., OTR/L Ascom: [phone] 08/23/19, 1:45 PM

## 2019-08-23 NOTE — Care Management Important Message (Signed)
Important Message  Patient Details  Name: DEVERY ODWYER MRN: [id number] Date of Birth: [birth year]   Medicare Important Message Given:  Yes     Bernadette Hoit 08/23/2019, 11:05 AM

## 2019-08-23 NOTE — TOC Progression Note (Signed)
Transition of Care [facility]) - Progression Note    Patient Details  Name: Melanie Lewis MRN: [id number] Date of Birth: 09-Feb-1934  Transition of [facility]) CM/SW Contact  Trenton Founds, RN Phone Number: 08/23/2019, 11:11 AM  Clinical Narrative:   RNCM received call from Darl Pikes with [facility] at patient's current status it would be highly unlikely that insurance would approve her for LTAC.          Expected Discharge Plan and Services                                                 Social Determinants of Health (SDOH) Interventions    Readmission Risk Interventions No flowsheet data found.

## 2019-08-23 NOTE — Progress Notes (Signed)
PROGRESS NOTE    Melanie Lewis  PUL:[id number] DOB: 05-Jul-1934 DOA: 07/28/2019 PCP: Leonel Ramsay, MD  Brief Narrative: 85 year old female with history of dementia, paroxysmal SVT, diastolic CHF, CAD, HTN and hypothyroidism admitted to ICU on 07/28/2019 with acute respiratory failure in the setting of severe angioedema from ACE inhibitor's requiring intubation with mechanical ventilation until 08/19/2019.   Hospital course complicated by intermittent A. fib with RVR and acute metabolic encephalopathy.  Cardiology and neurology consulted.  MRI brain negative for acute intracranial finding.  Neurology query "apathic hyperthyroidism".  She was a started on Provigil with minimal improvement in her mental status.  Palliative medicine involved.  Patient was transferred to [facility] on 08/21/2019.  Remains encephalopathic.  On tube feed via an NG tube.   8/11: Patient seen and examined.  Appears to be trying to speak but 2-week.  NG tube remains in place.  Tube feeds running at 50 cc/h.  Patient does have some comprehension.  Nods her head yes and no appropriately to interview questions.  No visualized distress    Assessment & Plan:   Active Problems:   Angioedema  Acute respiratory failure with hypoxia in the setting of severe angioedema, resolved Presumed due to ACE inhibitor -ETT 7/16-8/7.   -Weaned off nasal cannula  Completed steroid tape plans leave -Discontinued home lisinopril and added to allergy list  Acute metabolic encephalopathy in patient with history of dementia remains encephalopathic.   Appears to be attempting to respond verbally Does open eyes and answers appropriately by nodding her head  MRI brain without acute finding.  Neurology signed off. -Continue monitoring -Treat treatable course above. -Continue NG tube for now.  Placed 08/21/2019.  We will need to strongly consider PEG tube placement for continuing enteral feeds if mental status does  not improve  A. fib with RVR/history of paroxysmal SVT on amiodarone, sotalol and Coreg which have been on hold since admission.  RVR resolved.  TSH 0.014.  Free T4 1.28. -Continue propranolol in the setting of hyperthyroidism -Added Cardizem 30 mg every 6 hours -No amiodarone, sotalol and anticoagulation per cardiology  Hyperthyroidism: TSH 0.014.  Free T4 1.28. -Continue p.o. propranolol -Avoid amiodarone.  Dysphagia -Continue NG tube feeding.   - She may need G-tube if family wish to continue current scope of care. -SLP eval when mental status improves  Chronic diastolic CHF:  Echo in 1444 with EF of 65 to 70%, G1 DD and PA PP of 41.  Appears euvolemic except some trace edema.   she is on Demadex 20 mg twice daily at home.   Adequate urine output on Demadex.  Renal function stable. Plan: -Continue Demadex 20 mg daily -Monitor fluid status and renal function  Uncontrolled hypertension:  Now normotensive. -Continue propranolol, Cardizem and torsemide  Acute kidney injury, resolved  thought to be due to ATN.   Renal ultrasound reassuring.   Resolved. -Monitor intermittently  CAD-seems stable. -Resume home medications  Goal of care-patient is very sick.  Grim prognosis.  She is currently partial code with no CPR or intubation but NIPPV and ACLS meds.  Palliative medicine involved.   Continuing discussions with family regarding goals of care. -follow palliative medicine recommendations   DVT prophylaxis: Lovenox Code Status: Partial Family Communication: None today Disposition Plan: Status is: Inpatient  Remains inpatient appropriate because:Inpatient level of care appropriate due to severity of illness   Dispo: The patient is from: Home  Anticipated d/c is to: SNF              Anticipated d/c date is: > 3 days              Patient currently is not medically stable to d/c.  Patient still markedly encephalopathic and unable to tolerate p.o.   She currently has an NG tube in place.  Will need to monitor progression of encephalopathy and swallowing ability over the next 24 to 48 hours prior to formulation of disposition plan.  Consultants:   None  Procedures:   Endotracheal intubation, now extubated  Antimicrobials:   None   Subjective: Patient seen and examined.  Verbally unable to respond however does nod head yes or no.  Visibly does not appear in distress  Objective: Vitals:   08/23/19 0400 08/23/19 0437 08/23/19 0738 08/23/19 1152  BP:  140/80 140/84 133/68  Pulse:  80 (!) 104 94  Resp:  (!) 24 16 15   Temp:  97.8 F (36.6 C) 98.2 F (36.8 C)   TempSrc:  Oral Oral   SpO2:  100% 100% 100%  Weight: 84.8 kg     Height: 5' 2"  (1.575 m)       Intake/Output Summary (Last 24 hours) at 08/23/2019 1244 Last data filed at 08/23/2019 0854 Gross per 24 hour  Intake 658 ml  Output 800 ml  Net -142 ml   Filed Weights   08/18/19 0500 08/19/19 0447 08/23/19 0400  Weight: 87.5 kg 87.5 kg 84.8 kg    Examination:  General exam: Appears weak and deconditioned Respiratory system: Bibasilar crackles.  Normal work of breathing.  Room air Cardiovascular system: S1 & S2 heard, RRR. No JVD, murmurs, rubs, gallops or clicks. No pedal edema. Gastrointestinal system: Abdomen is nondistended, soft and nontender. No organomegaly or masses felt. Normal bowel sounds heard.  NG tube in place Central nervous system: Unable to assess due to encephalopathy. Extremities: Unable to assess power.  Muscle wasting noted bilaterally Skin: No rashes, lesions or ulcers Psychiatry: Unable to assess due to encephalopathy    Data Reviewed: I have personally reviewed following labs and imaging studies  CBC: Recent Labs  Lab 08/19/19 0744 08/20/19 0604 08/21/19 0439 08/22/19 0602 08/23/19 0402  WBC 12.5* 9.3 9.6 9.8 11.1*  NEUTROABS 10.1* 7.0 7.3 7.6 8.4*  HGB 11.8* 11.8* 12.1 12.0 10.9*  HCT 39.1 37.3 37.4 39.1 34.9*  MCV 96.8 93.5  91.9 96.1 94.3  PLT 119* 157 195 154 660   Basic Metabolic Panel: Recent Labs  Lab 08/19/19 0744 08/20/19 0604 08/21/19 0439 08/22/19 0602 08/23/19 0402  NA 141 143 145 143 144  K 5.2* 4.4 4.8 5.1 4.5  CL 115* 116* 118* 116* 114*  CO2 16* 20* 20* 21* 23  GLUCOSE 121* 109* 123* 157* 172*  BUN 52* 37* 31* 33* 33*  CREATININE 0.68 0.60 0.65 0.77 0.75  CALCIUM 8.1* 8.5* 8.5* 8.3* 8.5*  MG 2.8* 2.6* 2.6* 2.3 1.8  PHOS 3.1 3.1 3.4 2.7 2.0*   GFR: Estimated Creatinine Clearance: 54.8 mL/min (by C-G formula based on SCr of 0.75 mg/dL). Liver Function Tests: Recent Labs  Lab 08/19/19 0744 08/20/19 0604 08/21/19 0439 08/22/19 0602 08/23/19 0402  ALBUMIN 1.9* 2.1* 2.3* 2.1* 2.2*   No results for input(s): LIPASE, AMYLASE in the last 168 hours. No results for input(s): AMMONIA in the last 168 hours. Coagulation Profile: No results for input(s): INR, PROTIME in the last 168 hours. Cardiac Enzymes: No results for input(s): CKTOTAL,  CKMB, CKMBINDEX, TROPONINI in the last 168 hours. BNP (last 3 results) No results for input(s): PROBNP in the last 8760 hours. HbA1C: No results for input(s): HGBA1C in the last 72 hours. CBG: Recent Labs  Lab 08/22/19 2008 08/22/19 2343 08/23/19 0349 08/23/19 0739 08/23/19 1114  GLUCAP 229* 189* 159* 160* 187*   Lipid Profile: No results for input(s): CHOL, HDL, LDLCALC, TRIG, CHOLHDL, LDLDIRECT in the last 72 hours. Thyroid Function Tests: Recent Labs    08/21/19 0439  TSH 0.014*  T4TOTAL 5.3  FREET4 1.28*  T3FREE 0.9*   Anemia Panel: No results for input(s): VITAMINB12, FOLATE, FERRITIN, TIBC, IRON, RETICCTPCT in the last 72 hours. Sepsis Labs: No results for input(s): PROCALCITON, LATICACIDVEN in the last 168 hours.  No results found for this or any previous visit (from the past 240 hour(s)).       Radiology Studies: No results found.      Scheduled Meds: . atorvastatin  10 mg Per Tube Daily  . chlorhexidine  gluconate (MEDLINE KIT)  15 mL Mouth Rinse BID  . Chlorhexidine Gluconate Cloth  6 each Topical Daily  . diltiazem  30 mg Per Tube Q6H  . donepezil  10 mg Per Tube QHS  . enoxaparin (LOVENOX) injection  40 mg Subcutaneous Q24H  . feeding supplement (PROSource TF)  45 mL Per Tube Daily  . free water  60 mL Per Tube Q4H  . insulin aspart  0-20 Units Subcutaneous Q4H  . memantine  5 mg Per Tube BID  . propranolol  20 mg Per Tube TID  . propylthiouracil  50 mg Per NG tube Q8H  . sertraline  25 mg Per Tube Daily  . sodium chloride flush  3 mL Intravenous Q12H  . torsemide  20 mg Per Tube BID   Continuous Infusions: . sodium chloride 250 mL (08/10/19 1547)  . sodium chloride 10 mL/hr at 08/23/19 1014  . feeding supplement (OSMOLITE 1.5 CAL) 40 mL/hr at 08/22/19 0602  . sodium phosphate  Dextrose 5% IVPB 10 mmol (08/23/19 1203)     LOS: 26 days    Time spent: 25 minutes    Sidney Ace, MD [facility] Pager [phone]  If 7PM-7AM, please contact night-coverage 08/23/2019, 12:44 PM

## 2019-08-23 NOTE — Progress Notes (Signed)
Physical Therapy Treatment Patient Details Name: Melanie Lewis MRN: [id number] DOB: 03-29-1934 Today's Date: 08/23/2019    History of Present Illness Pt is an 85 y.o. female presenting to hospital 7/16 with lip swelling, tongue swelling, and some neck fullness; pt with SOB and difficulty breathing; intubated in ED emergently.  Pt admitted with severe acute hypoxic and hypercapnic respiratory failure from acute angioedema leading to severe airway obstruction; also AKI/renal failure.  During hospitalization pt found to have a-fib with RVR; pt also noted with metabolic encephalopathy.  PMH includes htn, CAD, CHF, dementia, heart attack, MI, bradycardia, PSVT.    PT Comments    Patient is alert, unable to answer questions or follow commands this date. UE and LE exercises performed in bed passively. Patient unable to assist at this time. Will continue to monitor and see as appropriate. If no improvement goal will be to educate family/caregivers in passive ROM.       Follow Up Recommendations  SNF     Equipment Recommendations  None recommended by PT;Other (comment) (TBD)    Recommendations for Other Services       Precautions / Restrictions Precautions Precautions: Fall Precaution Comments: NPO; NG tube; rectal tube Restrictions Weight Bearing Restrictions: No    Mobility  Bed Mobility               General bed mobility comments: Deferred  Transfers                 General transfer comment: Deferred  Ambulation/Gait             General Gait Details: Deferred   Stairs             Wheelchair Mobility    Modified Rankin (Stroke Patients Only)       Balance                                            Cognition Arousal/Alertness: Lethargic Behavior During Therapy: Flat affect Overall Cognitive Status: Difficult to assess Area of Impairment: Following commands               Rancho Levels of Cognitive  Functioning [place] Mirant Scales of Cognitive Functioning: Generalized response               General Comments: patient unable to verbalize, does not follow direction when given cues. Does not respond to questions.      Exercises Other Exercises Other Exercises: Bilateral LE exercises: 10 reps of ankle DF/PF, hip abd/add, Knee flexion/ext passively. B UE exercises: 10 reps wrist flexion/ext, elbow and shoulder flex/ext passive    General Comments        Pertinent Vitals/Pain Pain Assessment: Faces Faces Pain Scale: No hurt    Home Living Family/patient expects to be discharged to:: Skilled nursing facility                    Prior Function            PT Goals (current goals can now be found in the care plan section) Acute Rehab PT Goals Patient Stated Goal: none stated PT Goal Formulation: Patient unable to participate in goal setting Time For Goal Achievement: 09/04/19 Additional Goals Additional Goal #1: Assess mobility once appropriate and update functional mobility goals at that time. Progress towards PT goals: Not progressing toward goals - comment  Frequency    Min 2X/week      PT Plan Discharge plan needs to be updated    Co-evaluation              AM-PAC PT "6 Clicks" Mobility   Outcome Measure  Help needed turning from your back to your side while in a flat bed without using bedrails?: Total Help needed moving from lying on your back to sitting on the side of a flat bed without using bedrails?: Total Help needed moving to and from a bed to a chair (including a wheelchair)?: Total Help needed standing up from a chair using your arms (e.g., wheelchair or bedside chair)?: Total Help needed to walk in hospital room?: Total Help needed climbing 3-5 steps with a railing? : Total 6 Click Score: 6    End of Session Equipment Utilized During Treatment: Oxygen Activity Tolerance: Patient limited by lethargy Patient left: in bed;with  call bell/phone within reach;with bed alarm set Nurse Communication: Mobility status PT Visit Diagnosis: Muscle weakness (generalized) (M62.81);Other abnormalities of gait and mobility (R26.89);Adult, failure to thrive (R62.7)     Time: 1330-1340 PT Time Calculation (min) (ACUTE ONLY): 10 min  Charges:  $Therapeutic Exercise: 8-22 mins                     Dejour Vos, PT, GCS 08/23/19,1:51 PM

## 2019-08-24 ENCOUNTER — Inpatient Hospital Stay: Payer: Medicare Other

## 2019-08-24 DIAGNOSIS — T783XXD Angioneurotic edema, subsequent encounter: Secondary | ICD-10-CM | POA: Diagnosis not present

## 2019-08-24 LAB — CBC WITH DIFFERENTIAL/PLATELET
Abs Immature Granulocytes: 0.16 10*3/uL — ABNORMAL HIGH (ref 0.00–0.07)
Basophils Absolute: 0 10*3/uL (ref 0.0–0.1)
Basophils Relative: 0 %
Eosinophils Absolute: 0 10*3/uL (ref 0.0–0.5)
Eosinophils Relative: 0 %
HCT: 34.3 % — ABNORMAL LOW (ref 36.0–46.0)
Hemoglobin: 11.3 g/dL — ABNORMAL LOW (ref 12.0–15.0)
Immature Granulocytes: 2 %
Lymphocytes Relative: 13 %
Lymphs Abs: 1 10*3/uL (ref 0.7–4.0)
MCH: 30.1 pg (ref 26.0–34.0)
MCHC: 32.9 g/dL (ref 30.0–36.0)
MCV: 91.5 fL (ref 80.0–100.0)
Monocytes Absolute: 0.8 10*3/uL (ref 0.1–1.0)
Monocytes Relative: 11 %
Neutro Abs: 5.5 10*3/uL (ref 1.7–7.7)
Neutrophils Relative %: 74 %
Platelets: 148 10*3/uL — ABNORMAL LOW (ref 150–400)
RBC: 3.75 MIL/uL — ABNORMAL LOW (ref 3.87–5.11)
RDW: 15 % (ref 11.5–15.5)
WBC: 7.5 10*3/uL (ref 4.0–10.5)
nRBC: 0 % (ref 0.0–0.2)

## 2019-08-24 LAB — GLUCOSE, CAPILLARY
Glucose-Capillary: 142 mg/dL — ABNORMAL HIGH (ref 70–99)
Glucose-Capillary: 165 mg/dL — ABNORMAL HIGH (ref 70–99)
Glucose-Capillary: 169 mg/dL — ABNORMAL HIGH (ref 70–99)
Glucose-Capillary: 171 mg/dL — ABNORMAL HIGH (ref 70–99)
Glucose-Capillary: 177 mg/dL — ABNORMAL HIGH (ref 70–99)
Glucose-Capillary: 189 mg/dL — ABNORMAL HIGH (ref 70–99)

## 2019-08-24 LAB — RENAL FUNCTION PANEL
Albumin: 2.1 g/dL — ABNORMAL LOW (ref 3.5–5.0)
Anion gap: 7 (ref 5–15)
BUN: 32 mg/dL — ABNORMAL HIGH (ref 8–23)
CO2: 28 mmol/L (ref 22–32)
Calcium: 8.4 mg/dL — ABNORMAL LOW (ref 8.9–10.3)
Chloride: 108 mmol/L (ref 98–111)
Creatinine, Ser: 0.76 mg/dL (ref 0.44–1.00)
GFR calc Af Amer: >60 mL/min (ref >60)
GFR calc non Af Amer: >60 mL/min (ref >60)
Glucose, Bld: 178 mg/dL — ABNORMAL HIGH (ref 70–99)
Phosphorus: 2.2 mg/dL — ABNORMAL LOW (ref 2.5–4.6)
Potassium: 4.3 mmol/L (ref 3.5–5.1)
Sodium: 143 mmol/L (ref 135–145)

## 2019-08-24 LAB — MAGNESIUM: Magnesium: 1.9 mg/dL (ref 1.7–2.4)

## 2019-08-24 MED ORDER — SODIUM PHOSPHATES 45 MMOLE/15ML IV SOLN
15.0000 mmol | Freq: Once | INTRAVENOUS | Status: AC
  Administered 2019-08-24: 15 mmol via INTRAVENOUS
  Filled 2019-08-24: qty 5

## 2019-08-24 NOTE — Plan of Care (Signed)
  Problem: Health Behavior/Discharge Planning: Goal: Ability to manage health-related needs will improve Outcome: Not Progressing   

## 2019-08-24 NOTE — Progress Notes (Signed)
PROGRESS NOTE    Melanie Lewis  CZY:[id number] DOB: 1934-05-23 DOA: 07/28/2019 PCP: Leonel Ramsay, MD  Brief Narrative: 85 year old female with history of dementia, paroxysmal SVT, diastolic CHF, CAD, HTN and hypothyroidism admitted to ICU on 07/28/2019 with acute respiratory failure in the setting of severe angioedema from ACE inhibitor's requiring intubation with mechanical ventilation until 08/19/2019.   Hospital course complicated by intermittent A. fib with RVR and acute metabolic encephalopathy.  Cardiology and neurology consulted.  MRI brain negative for acute intracranial finding.  Neurology query "apathic hyperthyroidism".  She was a started on Provigil with minimal improvement in her mental status.  Palliative medicine involved.  Patient was transferred to [facility] on 08/21/2019.  Remains encephalopathic.  On tube feed via an NG tube.   8/11: Patient seen and examined.  Appears to be trying to speak but 2-week.  NG tube remains in place.  Tube feeds running at 50 cc/h.  Patient does have some comprehension.  Nods her head yes and no appropriately to interview questions.  No visualized distress  8/12: Patient seen and examined.  Remains verbally unresponsive.  Is able to nod head yes or no to simple questions.  Daughter at bedside.  Lengthy conversation about patient's current status and the uncertainty regarding her recovery potential.  Daughter undecided on PEG tube.    Assessment & Plan:   Active Problems:   Angioedema  Acute respiratory failure with hypoxia in the setting of severe angioedema, resolved Presumed due to ACE inhibitor -ETT 7/16-8/7.   -Weaned off nasal cannula  Completed steroid tape plans leave -Discontinued home lisinopril and added to allergy list  Acute metabolic encephalopathy in patient with history of dementia remains encephalopathic.   Appears to be attempting to respond verbally Does open eyes and answers appropriately by  nodding her head  MRI brain without acute finding.  Neurology signed off. -Continue monitoring -Treat treatable course above. -Continue NG tube for now.  Placed 08/21/2019.  We will need to strongly consider PEG tube placement for continuing enteral feeds if mental status does not improve -Lengthy conversation with the daughter at bedside today 08/24/2019.  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  Her ability to recover is greatly in question at this time.  Prognosis is poor.  Daughter seems aware of current state however states that patient was previously very high functioning.  Undecided on PEG tube at this time however if we are to continue with aggressive care would likely need a PEG sometime in the next few days.  A. fib with RVR/history of paroxysmal SVT on amiodarone, sotalol and Coreg which have been on hold since admission.  RVR resolved.  TSH 0.014.  Free T4 1.28. -Continue propranolol in the setting of hyperthyroidism -Added Cardizem 30 mg every 6 hours -No amiodarone, sotalol and anticoagulation per cardiology  Hyperthyroidism: TSH 0.014.  Free T4 1.28. -Continue p.o. propranolol -Avoid amiodarone.  Dysphagia -Continue NG tube feeding.   - She may need G-tube if family wish to continue current scope of care. -SLP eval when mental status improves  Chronic diastolic CHF:  Echo in 0932 with EF of 65 to 70%, G1 DD and PA PP of 41.  Appears euvolemic except some trace edema.   she is on Demadex 20 mg twice daily at home.   Adequate urine output on Demadex.  Renal function stable. Plan: -Continue Demadex 20 mg daily -Monitor fluid status  and renal function  Uncontrolled hypertension:  Now normotensive. -Continue propranolol, Cardizem and torsemide  Acute kidney injury, resolved  thought to be due to ATN.   Renal ultrasound reassuring.   Resolved. -Monitor  intermittently  CAD-seems stable. -Resume home medications  Goal of care-patient is very sick.  Grim prognosis.  She is currently partial code with no CPR or intubation but NIPPV and ACLS meds.  Palliative medicine involved.   Continuing discussions with family regarding goals of care. -follow palliative medicine recommendations   DVT prophylaxis: Lovenox Code Status: Partial Family Communication: Daughter at bedside Disposition Plan: Status is: Inpatient  Remains inpatient appropriate because:Inpatient level of care appropriate due to severity of illness   Dispo: The patient is from: Home              Anticipated d/c is to: SNF              Anticipated d/c date is: > 3 days              Patient currently is not medically stable to d/c.  Patient still encephalopathic.  Her mental status makes it unsafe to give her p.o.  Remains with NG tube in place.  Encephalopathy appears to be persistent although could be slightly improved.  Patient is able to understand and can respond in single head nods.  Disposition plan unclear at this time.  Consultants:   None  Procedures:   Endotracheal intubation, now extubated  Antimicrobials:   None   Subjective: Patient seen and examined.  Verbally unresponsive however does nod head yes or no.  Visibly does not appear in distress.  Objective: Vitals:   08/23/19 1152 08/23/19 1546 08/24/19 0058 08/24/19 0750  BP: 133/68 (!) 143/70 (!) 143/79 (!) 144/83  Pulse: 94 86 94 (!) 107  Resp: _0 Temp:   99.7 F (37.6 C) 98.8 F (37.1 C)  TempSrc:   Oral Oral  SpO2: 100% 100% 99% 100%  Weight:      Height:        Intake/Output Summary (Last 24 hours) at 08/24/2019 1342 Last data filed at 08/24/2019 1131 Gross per 24 hour  Intake 3 ml  Output 3200 ml  Net -3197 ml   Filed Weights   08/18/19 0500 08/19/19 0447 08/23/19 0400  Weight: 87.5 kg 87.5 kg 84.8 kg    Examination:  General exam:  Markedly weak.  Deconditioned.    Respiratory system: Bibasilar crackles.  Normal work of breathing.  Room air Cardiovascular system: S1 & S2 heard, RRR. No JVD, murmurs, rubs, gallops or clicks. No pedal edema. Gastrointestinal system: Abdomen is nondistended, soft and nontender. No organomegaly or masses felt. Normal bowel sounds heard.  NG tube in place Central nervous system: Unable to assess due to encephalopathy. Extremities: Unable to assess power.  Muscle wasting noted bilaterally Skin: No rashes, lesions or ulcers Psychiatry: Unable to assess due to encephalopathy     Data Reviewed: I have personally reviewed following labs and imaging studies  CBC: Recent Labs  Lab 08/20/19 0604 08/21/19 0439 08/22/19 0602 08/23/19 0402 08/24/19 0352  WBC 9.3 9.6 9.8 11.1* 7.5  NEUTROABS 7.0 7.3 7.6 8.4* 5.5  HGB 11.8* 12.1 12.0 10.9* 11.3*  HCT 37.3 37.4 39.1 34.9* 34.3*  MCV 93.5 91.9 96.1 94.3 91.5  PLT 157 195 154 185 383*   Basic Metabolic Panel: Recent Labs  Lab 08/20/19 0604 08/21/19 0439 08/22/19 0602 08/23/19 0402 08/24/19 0352  NA 143 145 143  144 143  K 4.4 4.8 5.1 4.5 4.3  CL 116* 118* 116* 114* 108  CO2 20* 20* 21* 23 28  GLUCOSE 109* 123* 157* 172* 178*  BUN 37* 31* 33* 33* 32*  CREATININE 0.60 0.65 0.77 0.75 0.76  CALCIUM 8.5* 8.5* 8.3* 8.5* 8.4*  MG 2.6* 2.6* 2.3 1.8 1.9  PHOS 3.1 3.4 2.7 2.0* 2.2*   GFR: Estimated Creatinine Clearance: 54.8 mL/min (by C-G formula based on SCr of 0.76 mg/dL). Liver Function Tests: Recent Labs  Lab 08/20/19 0604 08/21/19 0439 08/22/19 0602 08/23/19 0402 08/24/19 0352  ALBUMIN 2.1* 2.3* 2.1* 2.2* 2.1*   No results for input(s): LIPASE, AMYLASE in the last 168 hours. No results for input(s): AMMONIA in the last 168 hours. Coagulation Profile: No results for input(s): INR, PROTIME in the last 168 hours. Cardiac Enzymes: No results for input(s): CKTOTAL, CKMB, CKMBINDEX, TROPONINI in the last 168 hours. BNP (last 3 results) No results for  input(s): PROBNP in the last 8760 hours. HbA1C: No results for input(s): HGBA1C in the last 72 hours. CBG: Recent Labs  Lab 08/23/19 2043 08/24/19 0054 08/24/19 0417 08/24/19 0747 08/24/19 1134  GLUCAP 187* 177* 169* 165* 142*   Lipid Profile: No results for input(s): CHOL, HDL, LDLCALC, TRIG, CHOLHDL, LDLDIRECT in the last 72 hours. Thyroid Function Tests: No results for input(s): TSH, T4TOTAL, FREET4, T3FREE, THYROIDAB in the last 72 hours. Anemia Panel: No results for input(s): VITAMINB12, FOLATE, FERRITIN, TIBC, IRON, RETICCTPCT in the last 72 hours. Sepsis Labs: No results for input(s): PROCALCITON, LATICACIDVEN in the last 168 hours.  No results found for this or any previous visit (from the past 240 hour(s)).       Radiology Studies: CT HEAD WO CONTRAST  Result Date: 08/24/2019 CLINICAL DATA:  Mental status change, unknown cause. Additional history provided: Atrial fibrillation with RVR and acute metabolic encephalopathy. EXAM: CT HEAD WITHOUT CONTRAST TECHNIQUE: Contiguous axial images were obtained from the base of the skull through the vertex without intravenous contrast. COMPARISON:  Brain MRI 08/10/2019. FINDINGS: Brain: Cerebral volume is normal for age. There is no acute intracranial hemorrhage. No demarcated cortical infarct is identified. No extra-axial fluid collection. No evidence of intracranial mass. No midline shift. 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 Vascular: No hyperdense vessel.  Atherosclerotic calcifications. Skull: Normal. Negative for fracture or focal lesion. Sinuses/Orbits: Visualized orbits show no acute finding. Paranasal sinus disease. Most notably, there is near complete opacification of the right sphenoid and left maxillary sinuses. No significant mastoid effusion. IMPRESSION: No CT evidence of acute intracranial abnormality. Paranasal sinus disease as described. Most notably, there is near complete opacification of the right  sphenoid and left maxillary sinuses. Electronically Signed   By: Kellie Simmering DO   On: 08/24/2019 11:48        Scheduled Meds: . atorvastatin  10 mg Per Tube Daily  . chlorhexidine gluconate (MEDLINE KIT)  15 mL Mouth Rinse BID  . Chlorhexidine Gluconate Cloth  6 each Topical Daily  . diltiazem  30 mg Per Tube Q6H  . donepezil  10 mg Per Tube QHS  . enoxaparin (LOVENOX) injection  40 mg Subcutaneous Q24H  . feeding supplement (PROSource TF)  45 mL Per Tube Daily  . free water  60 mL Per Tube Q4H  . insulin aspart  0-20 Units Subcutaneous Q4H  . memantine  5 mg Per Tube BID  . propranolol  20 mg Per Tube TID  . propylthiouracil  50 mg Per NG tube Q8H  .  sertraline  25 mg Per Tube Daily  . sodium chloride flush  3 mL Intravenous Q12H  . torsemide  20 mg Per Tube BID   Continuous Infusions: . sodium chloride 250 mL (08/10/19 1547)  . sodium chloride Stopped (08/23/19 1120)  . feeding supplement (OSMOLITE 1.5 CAL) 1,000 mL (08/23/19 1551)  . sodium phosphate  Dextrose 5% IVPB 15 mmol (08/24/19 1130)     LOS: 27 days    Time spent: 25 minutes    Sidney Ace, MD [facility] Pager [phone]  If 7PM-7AM, please contact night-coverage 08/24/2019, 1:42 PM

## 2019-08-24 NOTE — TOC Initial Note (Signed)
Transition of Care [facility]) - Initial/Assessment Note    Patient Details  Name: Melanie Lewis MRN: [id number] Date of Birth: 1934-07-16  Transition of Care [facility]) CM/SW Contact:    Margarito Liner, LCSW Phone Number: 08/24/2019, 1:45 PM  Clinical Narrative: Patient's daughter at bedside. CSW told daughter that, per [facility], insurance likely will not cover LTACH placement. Discussed potential for SNF. Daughter does not have any experience with SNF placement. CSW provided CMS list for facilities within 25 miles of patient's zip code. Encouraged daughter to talk to friends and family and see if they have any recommendations. Explained that we will hold off on sending referral out until feeding options are determined. No further concerns. CSW encouraged patient's daughter to contact CSW as needed. CSW will continue to follow patient and her daughter for support and facilitate discharge to SNF once medically stable.  Expected Discharge Plan: Skilled Nursing Facility Barriers to Discharge: Continued Medical Work up   Patient Goals and CMS Choice Patient states their goals for this hospitalization and ongoing recovery are:: Patient not fully oriented. CMS Medicare.gov Compare Post Acute Care list provided to:: Patient Represenative (must comment) (Daughter)    Expected Discharge Plan and Services Expected Discharge Plan: Skilled Nursing Facility       Living arrangements for the past 2 months: Single Family Home                                      Prior Living Arrangements/Services Living arrangements for the past 2 months: Single Family Home   Patient language and need for interpreter reviewed:: Yes Do you feel safe going back to the place where you live?: Yes      Need for Family Participation in Patient Care: Yes (Comment)     Criminal Activity/Legal Involvement Pertinent to Current Situation/Hospitalization: No - Comment as needed  Activities of Daily Living Home  Assistive Devices/Equipment: None, Walker (specify type) ADL Screening (condition at time of admission) Patient's cognitive ability adequate to safely complete daily activities?: Yes Is the patient deaf or have difficulty hearing?: No Does the patient have difficulty seeing, even when wearing glasses/contacts?: No Does the patient have difficulty concentrating, remembering, or making decisions?: Yes Patient able to express need for assistance with ADLs?: Yes Does the patient have difficulty dressing or bathing?: No Independently performs ADLs?: Yes (appropriate for developmental age) Does the patient have difficulty walking or climbing stairs?: Yes Weakness of Legs: Both Weakness of Arms/Hands: None  Permission Sought/Granted Permission sought to share information with : Family Supports    Share Information with NAME: Gareth Eagle  Permission granted to share info w AGENCY: SNF's  Permission granted to share info w Relationship: Daughter  Permission granted to share info w Contact Information: [phone]  Emotional Assessment Appearance:: Appears stated age Attitude/Demeanor/Rapport: Unable to Assess Affect (typically observed): Unable to Assess Orientation: :  (Nonverbal) Alcohol / Substance Use: Not Applicable Psych Involvement: No (comment)  Admission diagnosis:  Angioedema [T78.3XXA] Patient Active Problem List   Diagnosis Date Noted  . Angioedema 07/28/2019  . Hyperthyroidism 04/20/2017  . Coronary artery disease of native artery of native heart with stable angina pectoris (HCC) 04/20/2017  . Pure hypercholesterolemia 04/20/2017  . Dementia without behavioral disturbance (HCC) 04/20/2017  . Osteopenia of lumbar spine 04/20/2017  . History of non-ST elevation myocardial infarction (NSTEMI) 08/14/2014  . Chronic diastolic heart failure (HCC) 06/29/2014  . Paroxysmal supraventricular  tachycardia (HCC) 06/23/2014  . Essential hypertension 04/19/2014  . Bradycardia  04/19/2014   PCP:  Mick Sell, MD Pharmacy:   [facility] [address] [place] - [address] AT [facility] [address] [place] [address] Phone: [phone] Fax: [phone]     Social Determinants of Health (SDOH) Interventions    Readmission Risk Interventions No flowsheet data found.

## 2019-08-24 NOTE — Progress Notes (Addendum)
PHARMACY CONSULT NOTE  Pharmacy Consult for Electrolyte Monitoring and Replacement   Recent Labs: Potassium (mmol/L)  Date Value  08/24/2019 4.3  02/11/2014 4.3   Magnesium (mg/dL)  Date Value  38/33/3832 1.9  02/07/2014 1.8   Calcium (mg/dL)  Date Value  91/91/6606 8.4 (L)   Calcium, Total (mg/dL)  Date Value  00/45/9977 9.4   Albumin (g/dL)  Date Value  41/42/3953 2.1 (L)  02/11/2014 2.9 (L)   Phosphorus (mg/dL)  Date Value  20/23/3435 2.2 (L)   Sodium (mmol/L)  Date Value  08/24/2019 143  09/18/2014 142  02/11/2014 139   Corrected Ca: 9.9 mg/dL   Assessment: 85 year old female admitted to the ICU with angioedema s/t ACE inhibitor use. Angioedema improved, patient with encephalopathy and significant hyperthyroidism.She was extubated on 08/18/19.   Pharmacy to manage electrolytes.  Goal of Therapy:  Electrolytes WNL  Plan:   Patient is at risk for refeeding.   Potassium  wnl's  Magnesium wnl's   Phosphorus 2.2 - will replace with Sodium Phosphate 15 mmol IV times 1  F/U electrolytes with morning labs   Albina Billet, PharmD, BCPS Clinical Pharmacist 08/24/2019 7:21 AM

## 2019-08-24 NOTE — Plan of Care (Signed)
PMT note:  Patient is off unit. Daughter is at bedside. She states a conversation regarding a feeding tube has been had. She states she is unsure of what they would like to do. She states her mother would not want a feeding tube. Will continue to follow.

## 2019-08-25 DIAGNOSIS — T783XXD Angioneurotic edema, subsequent encounter: Secondary | ICD-10-CM | POA: Diagnosis not present

## 2019-08-25 DIAGNOSIS — Z7189 Other specified counseling: Secondary | ICD-10-CM | POA: Diagnosis not present

## 2019-08-25 DIAGNOSIS — Z515 Encounter for palliative care: Secondary | ICD-10-CM | POA: Diagnosis not present

## 2019-08-25 LAB — RENAL FUNCTION PANEL
Albumin: 2 g/dL — ABNORMAL LOW (ref 3.5–5.0)
Anion gap: 10 (ref 5–15)
BUN: 25 mg/dL — ABNORMAL HIGH (ref 8–23)
CO2: 29 mmol/L (ref 22–32)
Calcium: 8.3 mg/dL — ABNORMAL LOW (ref 8.9–10.3)
Chloride: 101 mmol/L (ref 98–111)
Creatinine, Ser: 0.46 mg/dL (ref 0.44–1.00)
GFR calc Af Amer: >60 mL/min (ref >60)
GFR calc non Af Amer: >60 mL/min (ref >60)
Glucose, Bld: 182 mg/dL — ABNORMAL HIGH (ref 70–99)
Phosphorus: 2.5 mg/dL (ref 2.5–4.6)
Potassium: 4.1 mmol/L (ref 3.5–5.1)
Sodium: 140 mmol/L (ref 135–145)

## 2019-08-25 LAB — CBC WITH DIFFERENTIAL/PLATELET
Abs Immature Granulocytes: 0.17 10*3/uL — ABNORMAL HIGH (ref 0.00–0.07)
Basophils Absolute: 0 10*3/uL (ref 0.0–0.1)
Basophils Relative: 0 %
Eosinophils Absolute: 0 10*3/uL (ref 0.0–0.5)
Eosinophils Relative: 0 %
HCT: 36.1 % (ref 36.0–46.0)
Hemoglobin: 11.3 g/dL — ABNORMAL LOW (ref 12.0–15.0)
Immature Granulocytes: 2 %
Lymphocytes Relative: 14 %
Lymphs Abs: 1 10*3/uL (ref 0.7–4.0)
MCH: 29.4 pg (ref 26.0–34.0)
MCHC: 31.3 g/dL (ref 30.0–36.0)
MCV: 93.8 fL (ref 80.0–100.0)
Monocytes Absolute: 0.7 10*3/uL (ref 0.1–1.0)
Monocytes Relative: 9 %
Neutro Abs: 5.4 10*3/uL (ref 1.7–7.7)
Neutrophils Relative %: 75 %
Platelets: 128 10*3/uL — ABNORMAL LOW (ref 150–400)
RBC: 3.85 MIL/uL — ABNORMAL LOW (ref 3.87–5.11)
RDW: 14.8 % (ref 11.5–15.5)
WBC: 7.3 10*3/uL (ref 4.0–10.5)
nRBC: 0.3 % — ABNORMAL HIGH (ref 0.0–0.2)

## 2019-08-25 LAB — GLUCOSE, CAPILLARY
Glucose-Capillary: 141 mg/dL — ABNORMAL HIGH (ref 70–99)
Glucose-Capillary: 164 mg/dL — ABNORMAL HIGH (ref 70–99)
Glucose-Capillary: 166 mg/dL — ABNORMAL HIGH (ref 70–99)
Glucose-Capillary: 179 mg/dL — ABNORMAL HIGH (ref 70–99)
Glucose-Capillary: 183 mg/dL — ABNORMAL HIGH (ref 70–99)
Glucose-Capillary: 196 mg/dL — ABNORMAL HIGH (ref 70–99)
Glucose-Capillary: 202 mg/dL — ABNORMAL HIGH (ref 70–99)
Glucose-Capillary: 208 mg/dL — ABNORMAL HIGH (ref 70–99)

## 2019-08-25 LAB — CREATININE, SERUM
Creatinine, Ser: 0.54 mg/dL (ref 0.44–1.00)
GFR calc Af Amer: >60 mL/min (ref >60)
GFR calc non Af Amer: >60 mL/min (ref >60)

## 2019-08-25 LAB — MAGNESIUM: Magnesium: 1.5 mg/dL — ABNORMAL LOW (ref 1.7–2.4)

## 2019-08-25 MED ORDER — MAGNESIUM SULFATE 2 GM/50ML IV SOLN
2.0000 g | Freq: Once | INTRAVENOUS | Status: AC
  Administered 2019-08-25: 2 g via INTRAVENOUS
  Filled 2019-08-25: qty 50

## 2019-08-25 MED ORDER — OSMOLITE 1.5 CAL PO LIQD
1000.0000 mL | ORAL | Status: DC
  Administered 2019-08-25 – 2019-08-28 (×3): 1000 mL

## 2019-08-25 NOTE — Progress Notes (Signed)
PROGRESS NOTE    Melanie Lewis  AJO:[id number] DOB: 1934-06-22 DOA: 07/28/2019 PCP: Leonel Ramsay, MD  Brief Narrative: 85 year old female with history of dementia, paroxysmal SVT, diastolic CHF, CAD, HTN and hypothyroidism admitted to ICU on 07/28/2019 with acute respiratory failure in the setting of severe angioedema from ACE inhibitor's requiring intubation with mechanical ventilation until 08/19/2019.   Hospital course complicated by intermittent A. fib with RVR and acute metabolic encephalopathy.  Cardiology and neurology consulted.  MRI brain negative for acute intracranial finding.  Neurology query "apathic hyperthyroidism".  She was a started on Provigil with minimal improvement in her mental status.  Palliative medicine involved.  Patient was transferred to [facility] hospitalist service on 08/21/2019.  Remains encephalopathic.  On tube feed via an NG tube.   8/11: Patient seen and examined.  Appears to be trying to speak but 2-week.  NG tube remains in place.  Tube feeds running at 50 cc/h.  Patient does have some comprehension.  Nods her head yes and no appropriately to interview questions.  No visualized distress  8/12: Patient seen and examined.  Remains verbally unresponsive.  Is able to nod head yes or no to simple questions.  Daughter at bedside.  Lengthy conversation about patient's current status and the uncertainty regarding her recovery potential.  Daughter undecided on PEG tube.  8/13: Patient seen and examined.  Verbally unresponsive.  Does open eyes on command.  Unable to lift arms or speak.  Does nod head yes or no when asked simple questions.  No family members at bedside this morning.  The daughter and I spoke about PEG tube placement yesterday.  She is still undecided.    Assessment & Plan:   Active Problems:   Angioedema  Acute respiratory failure with hypoxia in the setting of severe angioedema, resolved Presumed due to ACE inhibitor -ETT 7/16-8/7.    -Weaned off nasal cannula  Completed steroid taper -Discontinued home lisinopril and added to allergy list  Acute metabolic encephalopathy in patient with history of dementia remains encephalopathic.   Appears to be attempting to respond verbally Does open eyes and answers appropriately by nodding her head  MRI brain without acute finding.  Neurology signed off. Repeat CT head on 08/24/2019, no acute findings Plan: -Continue monitoring -Treat treatable course above. -Continue NG tube for now.  Placed 08/21/2019.  We will need to strongly consider PEG tube placement for continuing enteral feeds if mental status does not improve if this is within the family's goals of care -Lengthy conversation with the daughter at bedside today 08/24/2019.  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  Her ability to recover is greatly in question at this time.  Prognosis is poor.  Daughter seems aware of current state however states that patient was previously very high functioning.  Undecided on PEG tube at this time however if we are to continue with aggressive care would likely need a PEG sometime in the next few days. -Plan of care discussed with palliative care NP Crystal.  We will proceed with swallow evaluation today.  Pending outpatient as we will communicate further with family to assess their wishes and goals of care  A. fib with RVR/history of paroxysmal SVT on amiodarone, sotalol and Coreg which have been on hold since admission.  RVR resolved.  TSH 0.014.  Free T4 1.28. -Continue propranolol in the setting of hyperthyroidism -Added Cardizem 30 mg every 6 hours -  No amiodarone, sotalol and anticoagulation per cardiology  Hyperthyroidism: TSH 0.014.  Free T4 1.28. -Continue p.o. propranolol -Avoid amiodarone.  Dysphagia -Continue NG tube feeding.   - She may need G-tube if family wish to  continue current scope of care. -SLP evaluation ordered 1/47/8295  Chronic diastolic CHF:  Echo in 6213 with EF of 65 to 70%, G1 DD and PA PP of 41.  Appears euvolemic except some trace edema.   she is on Demadex 20 mg twice daily at home.   Adequate urine output on Demadex.  Renal function stable. Plan: -Continue Demadex 20 mg daily -Monitor fluid status and renal function  Uncontrolled hypertension:  Now normotensive. -Continue propranolol, Cardizem and torsemide  Acute kidney injury, resolved  thought to be due to ATN.   Renal ultrasound reassuring.   Resolved. -Monitor intermittently  CAD-seems stable. -Resume home medications  Goals of care-patient is very sick.  Grim prognosis.  She is currently partial code with no CPR or intubation but NIPPV and ACLS meds.  Palliative medicine involved.   Continuing discussions with family regarding goals of care. -follow palliative medicine recommendations   DVT prophylaxis: Lovenox Code Status: Partial Family Communication: Daughter at bedside 8/12 Disposition Plan: Status is: Inpatient  Remains inpatient appropriate because:Inpatient level of care appropriate due to severity of illness   Dispo: The patient is from: Home              Anticipated d/c is to: SNF              Anticipated d/c date is: > 3 days              Patient currently is not medically stable to d/c.  Patient remains markedly encephalopathic.  She is unable to speak no raise her arms.  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  Her ability to recover is greatly in question.  Currently in talks with the patient's family as well as palliative care service to determine goals of care and appropriate course of action going forward.  If we are to proceed with aggressive care patient will require long-term enteral feed via PEG tube and placement in SNF versus long-term care facility.  Consultants:   None  Procedures:   Endotracheal  intubation, now extubated  Antimicrobials:   None   Subjective: Patient seen and examined.  Verbally unresponsive.  Does not head yes or no.  Visibly does not appear in distress.  Denies any pain complaints..  Objective: Vitals:   08/24/19 1555 08/24/19 2353 08/25/19 0558 08/25/19 0733  BP: (!) 146/77 (!) 152/88  (!) 138/93  Pulse: (!) 101 89  84  Resp: 20 18  20   Temp: 99 F (37.2 C) 98.3 F (36.8 C)  99 F (37.2 C)  TempSrc: Oral Oral  Oral  SpO2: 100% 99%  100%  Weight:   79.9 kg   Height:        Intake/Output Summary (Last 24 hours) at 08/25/2019 1026 Last data filed at 08/25/2019 0600 Gross per 24 hour  Intake 933 ml  Output 3200 ml  Net -2267 ml   Filed Weights   08/19/19 0447 08/23/19 0400 08/25/19 0558  Weight: 87.5 kg 84.8 kg 79.9 kg    Examination:  General: Lethargic, encephalopathic HEENT: Normocephalic, atraumatic Neck, supple, trachea midline, no tenderness Heart: Regular rate and rhythm, S1/S2 normal, no murmurs Lungs: Bibasilar crackles.  Normal work of breathing.  Room air Abdomen: Soft, nontender, nondistended, positive bowel  sounds, NG tube in place Extremities: Atraumatic.  No clubbing or cyanosis.  Muscle wasting noted bilaterally Skin: No rashes or lesions, normal color Neurologic: Unable to assess due to encephalopathy Psychiatric: Unable to assess due to encephalopathy      Data Reviewed: I have personally reviewed following labs and imaging studies  CBC: Recent Labs  Lab 08/21/19 0439 08/22/19 0602 08/23/19 0402 08/24/19 0352 08/25/19 0438  WBC 9.6 9.8 11.1* 7.5 7.3  NEUTROABS 7.3 7.6 8.4* 5.5 5.4  HGB 12.1 12.0 10.9* 11.3* 11.3*  HCT 37.4 39.1 34.9* 34.3* 36.1  MCV 91.9 96.1 94.3 91.5 93.8  PLT 195 154 185 148* 694*   Basic Metabolic Panel: Recent Labs  Lab 08/21/19 0439 08/22/19 0602 08/23/19 0402 08/24/19 0352 08/25/19 0438  NA 145 143 144 143 140  K 4.8 5.1 4.5 4.3 4.1  CL 118* 116* 114* 108 101  CO2 20* 21*  23 28 29   GLUCOSE 123* 157* 172* 178* 182*  BUN 31* 33* 33* 32* 25*  CREATININE 0.65 0.77 0.75 0.76 0.46  0.54  CALCIUM 8.5* 8.3* 8.5* 8.4* 8.3*  MG 2.6* 2.3 1.8 1.9 1.5*  PHOS 3.4 2.7 2.0* 2.2* 2.5   GFR: Estimated Creatinine Clearance: 53.1 mL/min (by C-G formula based on SCr of 0.54 mg/dL). Liver Function Tests: Recent Labs  Lab 08/21/19 0439 08/22/19 0602 08/23/19 0402 08/24/19 0352 08/25/19 0438  ALBUMIN 2.3* 2.1* 2.2* 2.1* 2.0*   No results for input(s): LIPASE, AMYLASE in the last 168 hours. No results for input(s): AMMONIA in the last 168 hours. Coagulation Profile: No results for input(s): INR, PROTIME in the last 168 hours. Cardiac Enzymes: No results for input(s): CKTOTAL, CKMB, CKMBINDEX, TROPONINI in the last 168 hours. BNP (last 3 results) No results for input(s): PROBNP in the last 8760 hours. HbA1C: No results for input(s): HGBA1C in the last 72 hours. CBG: Recent Labs  Lab 08/24/19 1557 08/24/19 2033 08/24/19 2344 08/25/19 0259 08/25/19 0739  GLUCAP 189* 171* 179* 202* 141*   Lipid Profile: No results for input(s): CHOL, HDL, LDLCALC, TRIG, CHOLHDL, LDLDIRECT in the last 72 hours. Thyroid Function Tests: No results for input(s): TSH, T4TOTAL, FREET4, T3FREE, THYROIDAB in the last 72 hours. Anemia Panel: No results for input(s): VITAMINB12, FOLATE, FERRITIN, TIBC, IRON, RETICCTPCT in the last 72 hours. Sepsis Labs: No results for input(s): PROCALCITON, LATICACIDVEN in the last 168 hours.  No results found for this or any previous visit (from the past 240 hour(s)).       Radiology Studies: CT HEAD WO CONTRAST  Result Date: 08/24/2019 CLINICAL DATA:  Mental status change, unknown cause. Additional history provided: Atrial fibrillation with RVR and acute metabolic encephalopathy. EXAM: CT HEAD WITHOUT CONTRAST TECHNIQUE: Contiguous axial images were obtained from the base of the skull through the vertex without intravenous contrast. COMPARISON:   Brain MRI 08/10/2019. FINDINGS: Brain: Cerebral volume is normal for age. There is no acute intracranial hemorrhage. No demarcated cortical infarct is identified. No extra-axial fluid collection. No evidence of intracranial mass. No midline shift. 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 Vascular: No hyperdense vessel.  Atherosclerotic calcifications. Skull: Normal. Negative for fracture or focal lesion. Sinuses/Orbits: Visualized orbits show no acute finding. Paranasal sinus disease. Most notably, there is near complete opacification of the right sphenoid and left maxillary sinuses. No significant mastoid effusion. IMPRESSION: No CT evidence of acute intracranial abnormality. Paranasal sinus disease as described. Most notably, there is near complete opacification of the right sphenoid and left maxillary sinuses. Electronically Signed  By: Kellie Simmering DO   On: 08/24/2019 11:48        Scheduled Meds: . atorvastatin  10 mg Per Tube Daily  . chlorhexidine gluconate (MEDLINE KIT)  15 mL Mouth Rinse BID  . Chlorhexidine Gluconate Cloth  6 each Topical Daily  . diltiazem  30 mg Per Tube Q6H  . donepezil  10 mg Per Tube QHS  . enoxaparin (LOVENOX) injection  40 mg Subcutaneous Q24H  . feeding supplement (PROSource TF)  45 mL Per Tube Daily  . free water  60 mL Per Tube Q4H  . insulin aspart  0-20 Units Subcutaneous Q4H  . memantine  5 mg Per Tube BID  . propranolol  20 mg Per Tube TID  . propylthiouracil  50 mg Per NG tube Q8H  . sertraline  25 mg Per Tube Daily  . sodium chloride flush  3 mL Intravenous Q12H  . torsemide  20 mg Per Tube BID   Continuous Infusions: . sodium chloride 250 mL (08/10/19 1547)  . sodium chloride Stopped (08/23/19 1120)  . feeding supplement (OSMOLITE 1.5 CAL) 1,000 mL (08/24/19 1409)     LOS: 28 days    Time spent: 25 minutes    Sidney Ace, MD [facility] Pager [phone]  If 7PM-7AM, please contact  night-coverage 08/25/2019, 10:26 AM

## 2019-08-25 NOTE — Care Management Important Message (Signed)
Important Message  Patient Details  Name: Melanie Lewis MRN: [id number] Date of Birth: 07/02/1934   Medicare Important Message Given:  Yes     Danely Bayliss, Stephan Minister 08/25/2019, 1:20 PM

## 2019-08-25 NOTE — Progress Notes (Signed)
Nutrition Follow-up  DOCUMENTATION CODES:   Obesity unspecified  INTERVENTION:   Increase Osmolite 1.5 to 52m/hr   Continue Pro-Source 461mdaily via tube, provides 40kcal and 11g of protein per serving   Continue free water flushes 6031m4 hours   Regimen provides 2200kcal/day, 101g/day protein and 1457m42my free water.  NUTRITION DIAGNOSIS:   Inadequate oral intake related to inability to eat (pt sedated and ventilated) as evidenced by NPO status. Ongoing.  GOAL:   Patient will meet greater than or equal to 90% of their needs -met with tube feeds   MONITOR:   Diet advancement, Labs, Weight trends, TF tolerance, Skin, I & O's  ASSESSMENT:   84 y2 female with h/o HTN, CHF, CAD, dementia, MI and hyperthyroidism who is admitted with angioedema requiring intubation.   Pt continues on tube feeds at goal rate; pt tolerating well. Pt being followed by SLP and is still unable to initiate on oral diet. Palliative care following. Family currently deciding on whether or not to place PEG tube. Per chart, pt down 21lbs since admit; pt is now 30lbs(15%) below her UBW. RD will increase tube feeds to provide and additional 360kcal/day. RD will monitor for GOC.   Medications reviewed and include: lovenox, insulin, torsemide   Labs reviewed: BUN 25(H), K 4.4 wnl, P 2.5 wnl, Mg 1.5(L) cbgs- 202, 141, 183 x 24 hrs  Diet Order:   Diet Order            Diet NPO time specified  Diet effective now                EDUCATION NEEDS:   No education needs have been identified at this time  Skin:  Skin Assessment: Reviewed RN Assessment (MASD to groin)  Last BM:  8/11- type 6  Height:   Ht Readings from Last 1 Encounters:  08/23/19 _0  (1.575 m)   Weight:   Wt Readings from Last 1 Encounters:  08/25/19 79.9 kg   Ideal Body Weight:  50 kg  BMI:  Body mass index is 32.23 kg/m.  Estimated Nutritional Needs:   Kcal:  1700-1900kcal/day  Protein:  85-95g/day  Fluid:   1.5 L/day  CaseKoleen Distance RD, LDN Please refer to [facility] and/or RD on-call/weekend/after hours pager

## 2019-08-25 NOTE — Progress Notes (Signed)
Daily Progress Note   Patient Name: Melanie Lewis       Date: 08/25/2019 DOB: Aug 16, 1934  Age: 85 y.o. MRN#: [id number] Attending Physician: Sidney Ace, MD Primary Care Physician: Leonel Ramsay, MD Admit Date: 07/28/2019  Reason for Consultation/Follow-up: Psychosocial/spiritual support  Subjective: Patient is resting in bed with feeding tube to nose. She looks over a t me at times. Daughter is at bedside. She states her mother closes her mouth every time you put something near it. She states her mother knows what she is doing and does not want anything in her mouth. She states her mother would not want a feeding tube. Upon discussing inability to sustain without adequate nutrition, and discussion of comfort care, she states her mother does not understand what she is saying and doing, and that the family needs to work with her to get her to want to eat and drink. She states she has requested SLP to attempt reevaluation again. I will f/u Monday.   Length of Stay: 28  Current Medications: Scheduled Meds:  . atorvastatin  10 mg Per Tube Daily  . chlorhexidine gluconate (MEDLINE KIT)  15 mL Mouth Rinse BID  . Chlorhexidine Gluconate Cloth  6 each Topical Daily  . diltiazem  30 mg Per Tube Q6H  . donepezil  10 mg Per Tube QHS  . enoxaparin (LOVENOX) injection  40 mg Subcutaneous Q24H  . feeding supplement (PROSource TF)  45 mL Per Tube Daily  . free water  60 mL Per Tube Q4H  . insulin aspart  0-20 Units Subcutaneous Q4H  . memantine  5 mg Per Tube BID  . propranolol  20 mg Per Tube TID  . propylthiouracil  50 mg Per NG tube Q8H  . sertraline  25 mg Per Tube Daily  . sodium chloride flush  3 mL Intravenous Q12H  . torsemide  20 mg Per Tube BID    Continuous  Infusions: . sodium chloride 250 mL (08/10/19 1547)  . sodium chloride Stopped (08/23/19 1120)  . feeding supplement (OSMOLITE 1.5 CAL) 1,000 mL (08/25/19 1500)    PRN Meds: sodium chloride, hydrALAZINE, sodium chloride flush  Physical Exam Skin:    General: Skin is warm and dry.  Neurological:     Mental Status: She is alert.  Vital Signs: BP 139/79 (BP Location: Right Arm)   Pulse 82   Temp 98.6 F (37 C) (Oral)   Resp 20   Ht _0  (1.575 m)   Wt 79.9 kg   LMP  (LMP Unknown)   SpO2 100%   BMI 32.23 kg/m  SpO2: SpO2: 100 % O2 Device: O2 Device: Nasal Cannula O2 Flow Rate: O2 Flow Rate (L/min): 2 L/min  Intake/output summary:   Intake/Output Summary (Last 24 hours) at 08/25/2019 1630 Last data filed at 08/25/2019 1500 Gross per 24 hour  Intake 1380.77 ml  Output 3100 ml  Net -1719.23 ml   LBM: Last BM Date: 08/23/19 Baseline Weight: Weight: 93 kg Most recent weight: Weight: 79.9 kg       Palliative Assessment/Data:    Flowsheet Rows     Most Recent Value  Intake Tab  Referral Department Hospitalist  Unit at Time of Referral ICU  Date Notified 08/13/19  Palliative Care Type New Palliative care  Reason for referral Clarify Goals of Care  Date of Admission 07/28/19  Date first seen by Palliative Care 08/15/19  # of days Palliative referral response time 2 Day(s)  # of days IP prior to Palliative referral 16  Clinical Assessment  Psychosocial & Spiritual Assessment  Palliative Care Outcomes      Patient Active Problem List   Diagnosis Date Noted  . Angioedema 07/28/2019  . Hyperthyroidism 04/20/2017  . Coronary artery disease of native artery of native heart with stable angina pectoris ([place]) 04/20/2017  . Pure hypercholesterolemia 04/20/2017  . Dementia without behavioral disturbance ([place]) 04/20/2017  . Osteopenia of lumbar spine 04/20/2017  . History of non-ST elevation myocardial infarction (NSTEMI) 08/14/2014  . Chronic diastolic  heart failure ([place]) 06/29/2014  . Paroxysmal supraventricular tachycardia ([place]) 06/23/2014  . Essential hypertension 04/19/2014  . Bradycardia 04/19/2014    Palliative Care Assessment & Plan   Recommendations/Plan: Plans for patient to move to [facility].  Recommend palliative to follow at D/C. Will f/u Monday     Code Status:    Code Status Orders  (From admission, onward)         Start     Ordered   08/15/19 1450  Limited resuscitation (code)  Continuous       Question Answer Comment  In the event of cardiac or respiratory ARREST: Initiate Code Blue, Call Rapid Response Yes   In the event of cardiac or respiratory ARREST: Perform CPR No   In the event of cardiac or respiratory ARREST: Perform Intubation/Mechanical Ventilation No   In the event of cardiac or respiratory ARREST: Use NIPPV/BiPAp only if indicated Yes   In the event of cardiac or respiratory ARREST: Administer ACLS medications if indicated Yes   In the event of cardiac or respiratory ARREST: Perform Defibrillation or Cardioversion if indicated No   Comments No defibrillation or CPR. No reintubation/trach following this extubation which should occur when medically optimized.      08/15/19 1451        Code Status History    Date Active Date Inactive Code Status Order ID Comments User Context   07/28/2019 1542 08/15/2019 1451 Full Code [id number]  Flora Lipps, MD ED   08/14/2014 1415 08/15/2014 1531 Full Code [id number]  Isaias Cowman, MD Inpatient   08/13/2014 2206 08/14/2014 1415 Full Code [id number]  Lytle Butte, MD ED   06/23/2014 0527 06/24/2014 1617 Full Code [id number]  Juluis Mire, MD Inpatient   Advance Care Planning Activity  Prognosis:  Poor overall    Thank you for allowing the Palliative Medicine Team to assist in the care of this patient.   Total Time 25 min Prolonged Time Billed  no      Greater than 50%  of this time was spent counseling and coordinating care related to the above  assessment and plan.  Asencion Gowda, NP  Please contact Palliative Medicine Team phone at [phone] for questions and concerns.

## 2019-08-25 NOTE — Progress Notes (Signed)
Physical Therapy Treatment Patient Details Name: Melanie Lewis MRN: [id number] DOB: February 13, 1934 Today's Date: 08/25/2019    History of Present Illness Pt is an 85 y.o. female presenting to hospital 7/16 with lip swelling, tongue swelling, and some neck fullness; pt with SOB and difficulty breathing; intubated in ED emergently.  Pt admitted with severe acute hypoxic and hypercapnic respiratory failure from acute angioedema leading to severe airway obstruction; also AKI/renal failure.  During hospitalization pt found to have a-fib with RVR; pt also noted with metabolic encephalopathy.  PMH includes htn, CAD, CHF, dementia, heart attack, MI, bradycardia, PSVT.    PT Comments    Pt alert upon arrival to room, daughter present.  Pt is unable to respond to questions asked during today's session.  Pt is able to move her mouth to an open position, but is unable to communicate.  UE and LE exercises performed in bed passively at this time.  Pt was unable to assist, although was asked to do so.  Daughter was educated on how to perform passive ROM of the LE's while in room.  Pt will be monitored for improvement over course of stay.       Follow Up Recommendations  SNF     Equipment Recommendations  None recommended by PT;Other (comment) (TBD)    Recommendations for Other Services       Precautions / Restrictions Precautions Precautions: Fall Precaution Comments: NPO; NG tube; rectal tube Restrictions Weight Bearing Restrictions: No    Mobility  Bed Mobility                  Transfers                    Ambulation/Gait                 Stairs             Wheelchair Mobility    Modified Rankin (Stroke Patients Only)       Balance                                            Cognition Arousal/Alertness: Lethargic Behavior During Therapy: Flat affect Overall Cognitive Status: Difficult to assess Area of Impairment: Following  commands               Rancho Levels of Cognitive Functioning [place] Mirant Scales of Cognitive Functioning: Generalized response               General Comments: patient unable to verbalize, does not follow direction when given cues. Does not respond to questions.      Exercises Other Exercises Other Exercises: Bilateral LE exercises: 10 reps of ankle DF/PF, hip abd/add, Knee flexion/ext passively. B UE exercises: 10 reps wrist flexion/ext, elbow and shoulder flex/ext passive    General Comments        Pertinent Vitals/Pain Pain Assessment: Faces Faces Pain Scale: No hurt    Home Living                      Prior Function            PT Goals (current goals can now be found in the care plan section) Acute Rehab PT Goals Patient Stated Goal: none stated PT Goal Formulation: Patient unable to participate in goal setting Time For Goal Achievement: 09/04/19  Additional Goals Additional Goal #1: Assess mobility once appropriate and update functional mobility goals at that time. Progress towards PT goals: Not progressing toward goals - comment    Frequency    Min 2X/week      PT Plan Discharge plan needs to be updated    Co-evaluation              AM-PAC PT "6 Clicks" Mobility   Outcome Measure  Help needed turning from your back to your side while in a flat bed without using bedrails?: Total Help needed moving from lying on your back to sitting on the side of a flat bed without using bedrails?: Total Help needed moving to and from a bed to a chair (including a wheelchair)?: Total Help needed standing up from a chair using your arms (e.g., wheelchair or bedside chair)?: Total Help needed to walk in hospital room?: Total Help needed climbing 3-5 steps with a railing? : Total 6 Click Score: 6    End of Session Equipment Utilized During Treatment: Oxygen Activity Tolerance: Patient limited by lethargy Patient left: in bed;with call  bell/phone within reach;with bed alarm set Nurse Communication: Mobility status PT Visit Diagnosis: Muscle weakness (generalized) (M62.81);Other abnormalities of gait and mobility (R26.89);Adult, failure to thrive (R62.7)     Time: 7017-7939 PT Time Calculation (min) (ACUTE ONLY): 18 min  Charges:  $Therapeutic Exercise: 8-22 mins                     Nolon Bussing, PT, DPT 08/25/19, 4:34 PM

## 2019-08-25 NOTE — Evaluation (Signed)
Clinical/Bedside Swallow Evaluation Patient Details  Name: Melanie Lewis MRN: [id number] Date of Birth: 03-30-1934  Today's Date: 08/25/2019 Time: SLP Start Time (ACUTE ONLY): 1230 SLP Stop Time (ACUTE ONLY): 1300 SLP Time Calculation (min) (ACUTE ONLY): 30 min  Past Medical History:  Past Medical History:  Diagnosis Date  . CHF (congestive heart failure) (HCC)   . Coronary artery disease   . Dementia (HCC)   . Heart attack (HCC)   . Hypertension   . hyperthyroid   . Hyperthyroidism   . MI (myocardial infarction) [facility])    Past Surgical History:  Past Surgical History:  Procedure Laterality Date  . CARDIAC CATHETERIZATION N/A 08/14/2014   Procedure: Left Heart Cath and Coronary Angiography;  Surgeon: Marcina Millard, MD;  Location: [facility] INVASIVE CV LAB;  Service: Cardiovascular;  Laterality: N/A;  . CHOLECYSTECTOMY    . PARTIAL HYSTERECTOMY     HPI:  Patient is a 85 year old female with history of dementia, paroxysmal SVT, diastolic CHF, CAD, HTN and hypothyroidism admitted to ICU on 07/28/2019 with acute respiratory failure in the setting of severe angioedema from ACE inhibitor's requiring intubation with mechanical ventilation until 08/18/2019. Hospital course complicated by intermittent A. fib with RVR and acute metabolic encephalopathy. Palliative medicine involved. Patient verbally unreponsive, does open eyes on command and respond with head shakes/nods to occassional y/n ques. Currently on tube feed via NG tube, however discussion re: appropriateness to resume PO diet. SLP to complete BSE today per MD order.   Assessment / Plan / Recommendation Clinical Impression  Patient presents with oral dysphagia c/b cognitive feeding deficits, decreased awareness of bolus in oral cavity, and poor oral manipulation of small ice chips and tsp thins. Unable to determine presence/absence of pharyngeal dysphagia d/t patient's severe resistance towards accepting PO trials today (I.e. closed  lips when placing spoon in visual field and on patient's labial surface) and decreased ability to follow simple 1 step commands during oral motor examination, however no coughing/choking noted on the x2 small ice chips and x2 tsp thins provided given max assistance for jaw opening. Laryngeal movement was noted following x2 of the boluses presented. Patient required maximum cues/encouragement in order to increase alertness for participation in today's session and to orally accept small amounts of ice chips and thin liquids. Unable to adequately assess the status of patient's oral cavity, however able to provided some oral care with toothbrush/toothpaste given max assistance prior to PO trials.  D/t patient's medical history, recent prolonged intubation, suspected cognitive/language deficits, decreased accepted of PO trials, prolonged NPO status, need for supplemental oxygen, and decreased alertness, patient at severe risk for aspiration and, in turn, at risk for malnutrition/dehydration.   Thorough education provided to daughter present re: SLP's inability to gather adequate view of patient's oropharyngeal swallowing function d/t patient's resistance to PO intake today even with max cues/encouragement and recommendation to continue NPO status. May have SMALL ice chips SPARINGLY after oral care if patient is alert/upright and accepting of PO ice chips. Daughter verbalized understanding and agreement. Will continue an ongoing clinical swallowing assessment to determine readiness for PO intake and/or appropriateness to participate in MBSS during hospital stay to r/o pharyngeal dysphagia.   SLP Visit Diagnosis: Dysphagia, oral phase (R13.11)    Aspiration Risk  Severe aspiration risk;Risk for inadequate nutrition/hydration    Diet Recommendation NPO;Ice chips PRN after oral care;Other (Comment) (May have small ice chips sparingly after oral care if alert/upright)   Medication Administration: Via alternative  means Postural Changes: Seated upright  at 90 degrees    Other  Recommendations Oral Care Recommendations: Oral care prior to ice chip/H20   Follow up Recommendations  (TBD)      Frequency and Duration min 3x week  2 weeks       Prognosis Prognosis for Safe Diet Advancement: Guarded Barriers to Reach Goals: Cognitive deficits;Severity of deficits;Language deficits      Swallow Study   General Date of Onset: 07/28/19 HPI: Patient is a 85 year old female with history of dementia, paroxysmal SVT, diastolic CHF, CAD, HTN and hypothyroidism admitted to ICU on 07/28/2019 with acute respiratory failure in the setting of severe angioedema from ACE inhibitor's requiring intubation with mechanical ventilation until 08/18/2019. Hospital course complicated by intermittent A. fib with RVR and acute metabolic encephalopathy. Palliative medicine involved. Patient verbally unreponsive, does open eyes on command and respond with head shakes/nods to occassional y/n ques. Currently on tube feed via NG tube, however discussion re: appropriateness to resume PO diet. SLP to complete BSE today per MD order. Type of Study: Bedside Swallow Evaluation Previous Swallow Assessment: No reported history of previous swallowing assessment. Diet Prior to this Study: NPO;NG Tube Respiratory Status: Nasal cannula;Other (comment) (2L O2 via [place]) Behavior/Cognition: Lethargic/Drowsy;Requires cueing Oral Cavity Assessment: Other (comment) (unable to adequately assess oral cavity d/t patient's resistance towards opening mouth) Oral Cavity - Dentition: Missing dentition Self-Feeding Abilities: Total assist Patient Positioning: Upright in bed Baseline Vocal Quality: Not observed Volitional Cough: Other (Comment) (not observed) Volitional Swallow:  (laryngeal movement not present upon command)    Oral/Motor/Sensory Function Overall Oral Motor/Sensory Function: Other (comment) (Difficult to determine d/t patient's resistance  towards opening mouth and following directions for OME)   Ice Chips Ice chips: Impaired Oral Phase Impairments: Reduced lingual movement/coordination;Poor awareness of bolus   Thin Liquid Thin Liquid: Impaired Presentation: Spoon Oral Phase Impairments: Reduced labial seal;Poor awareness of bolus    Nectar Thick Nectar Thick Liquid: Not tested   Honey Thick Honey Thick Liquid: Not tested   Puree Puree: Not tested   Solid     Solid: Not tested     Everlean Patterson, M.S. CCC-SLP Speech-Language Pathologist  Everlean Patterson 08/25/2019,1:06 PM

## 2019-08-25 NOTE — Progress Notes (Signed)
PHARMACY CONSULT NOTE  Pharmacy Consult for Electrolyte Monitoring and Replacement   Recent Labs: Potassium (mmol/L)  Date Value  08/25/2019 4.1  02/11/2014 4.3   Magnesium (mg/dL)  Date Value  96/75/9163 1.5 (L)  02/07/2014 1.8   Calcium (mg/dL)  Date Value  84/66/5993 8.3 (L)   Calcium, Total (mg/dL)  Date Value  57/01/7791 9.4   Albumin (g/dL)  Date Value  90/30/0923 2.0 (L)  02/11/2014 2.9 (L)   Phosphorus (mg/dL)  Date Value  30/07/6224 2.5   Sodium (mmol/L)  Date Value  08/25/2019 140  09/18/2014 142  02/11/2014 139   Corrected Ca: 9.9 mg/dL   Assessment: 85 year old female admitted to the ICU with angioedema s/t ACE inhibitor use. Angioedema improved, patient with encephalopathy and significant hyperthyroidism.She was extubated on 08/18/19. Transferred out of the ICU on 8/8.   Pharmacy to manage electrolytes.  Goal of Therapy:  Electrolytes WNL  Plan:   Patient is at risk for refeeding.   Potassium  wnl  Magnesium 1.5 - will order magnesium sulfate 2g IV x1  Phosphorus wnl  F/U electrolytes with morning labs   Marty Heck, PharmD, BCPS Clinical Pharmacist 08/25/2019 7:35 AM

## 2019-08-26 DIAGNOSIS — T783XXD Angioneurotic edema, subsequent encounter: Secondary | ICD-10-CM | POA: Diagnosis not present

## 2019-08-26 LAB — CBC WITH DIFFERENTIAL/PLATELET
Abs Immature Granulocytes: 0.17 10*3/uL — ABNORMAL HIGH (ref 0.00–0.07)
Basophils Absolute: 0 10*3/uL (ref 0.0–0.1)
Basophils Relative: 0 %
Eosinophils Absolute: 0 10*3/uL (ref 0.0–0.5)
Eosinophils Relative: 0 %
HCT: 38.8 % (ref 36.0–46.0)
Hemoglobin: 12.2 g/dL (ref 12.0–15.0)
Immature Granulocytes: 2 %
Lymphocytes Relative: 17 %
Lymphs Abs: 1.5 10*3/uL (ref 0.7–4.0)
MCH: 29.3 pg (ref 26.0–34.0)
MCHC: 31.4 g/dL (ref 30.0–36.0)
MCV: 93.3 fL (ref 80.0–100.0)
Monocytes Absolute: 0.8 10*3/uL (ref 0.1–1.0)
Monocytes Relative: 9 %
Neutro Abs: 6.4 10*3/uL (ref 1.7–7.7)
Neutrophils Relative %: 72 %
Platelets: 151 10*3/uL (ref 150–400)
RBC: 4.16 MIL/uL (ref 3.87–5.11)
RDW: 14.8 % (ref 11.5–15.5)
WBC: 8.9 10*3/uL (ref 4.0–10.5)
nRBC: 0.4 % — ABNORMAL HIGH (ref 0.0–0.2)

## 2019-08-26 LAB — RENAL FUNCTION PANEL
Albumin: 2.1 g/dL — ABNORMAL LOW (ref 3.5–5.0)
Anion gap: 8 (ref 5–15)
BUN: 34 mg/dL — ABNORMAL HIGH (ref 8–23)
CO2: 31 mmol/L (ref 22–32)
Calcium: 8.5 mg/dL — ABNORMAL LOW (ref 8.9–10.3)
Chloride: 98 mmol/L (ref 98–111)
Creatinine, Ser: 0.63 mg/dL (ref 0.44–1.00)
GFR calc Af Amer: >60 mL/min (ref >60)
GFR calc non Af Amer: >60 mL/min (ref >60)
Glucose, Bld: 241 mg/dL — ABNORMAL HIGH (ref 70–99)
Phosphorus: 2.7 mg/dL (ref 2.5–4.6)
Potassium: 4.8 mmol/L (ref 3.5–5.1)
Sodium: 137 mmol/L (ref 135–145)

## 2019-08-26 LAB — GLUCOSE, CAPILLARY
Glucose-Capillary: 212 mg/dL — ABNORMAL HIGH (ref 70–99)
Glucose-Capillary: 124 mg/dL — ABNORMAL HIGH (ref 70–99)
Glucose-Capillary: 190 mg/dL — ABNORMAL HIGH (ref 70–99)
Glucose-Capillary: 182 mg/dL — ABNORMAL HIGH (ref 70–99)
Glucose-Capillary: 193 mg/dL — ABNORMAL HIGH (ref 70–99)

## 2019-08-26 LAB — MAGNESIUM: Magnesium: 2 mg/dL (ref 1.7–2.4)

## 2019-08-26 NOTE — Progress Notes (Signed)
PROGRESS NOTE    Melanie Lewis  EVO:[id number] DOB: Apr 08, 1934 DOA: 07/28/2019 PCP: Leonel Ramsay, MD  Brief Narrative: 85 year old female with history of dementia, paroxysmal SVT, diastolic CHF, CAD, HTN and hypothyroidism admitted to ICU on 07/28/2019 with acute respiratory failure in the setting of severe angioedema from ACE inhibitor's requiring intubation with mechanical ventilation until 08/19/2019.   Hospital course complicated by intermittent A. fib with RVR and acute metabolic encephalopathy.  Cardiology and neurology consulted.  MRI brain negative for acute intracranial finding.  Neurology query "apathic hyperthyroidism".  She was a started on Provigil with minimal improvement in her mental status.  Palliative medicine involved.  Patient was transferred to [facility] on 08/21/2019.  Remains encephalopathic.  On tube feed via an NG tube.   8/11: Patient seen and examined.  Appears to be trying to speak but 2-week.  NG tube remains in place.  Tube feeds running at 50 cc/h.  Patient does have some comprehension.  Nods her head yes and no appropriately to interview questions.  No visualized distress  8/12: Patient seen and examined.  Remains verbally unresponsive.  Is able to nod head yes or no to simple questions.  Daughter at bedside.  Lengthy conversation about patient's current status and the uncertainty regarding her recovery potential.  Daughter undecided on PEG tube.  8/13: Patient seen and examined.  Verbally unresponsive.  Does open eyes on command.  Unable to lift arms or speak.  Does nod head yes or no when asked simple questions.  No family members at bedside this morning.  The daughter and I spoke about PEG tube placement yesterday.  She is still undecided.  8/14: Patient seen and examined.  Remains verbally unresponsive.  Does open eyes on command.  I had a long conversation with speech therapist today.  She reach out to the patient's wife addressing the  concerns.  I am recommending strict n.p.o. given patient's nearly complete lack of ability to open her mouth and protect her airway when eating.  Metabolic panel and hemogram largely within normal limits.  No acute decompensating events overnight.  Patient remains very grim prognosis and high risk for further decompensation.    Assessment & Plan:   Active Problems:   Angioedema  Acute respiratory failure with hypoxia in the setting of severe angioedema, resolved Presumed due to ACE inhibitor -ETT 7/16-8/7.   -Weaned off nasal cannula  Completed steroid taper -Discontinued home lisinopril and added to allergy list  Acute metabolic encephalopathy in patient with history of dementia remains encephalopathic.   Appears to be attempting to respond verbally Does open eyes and answers appropriately by nodding her head  MRI brain without acute finding.  Neurology signed off. Repeat CT head on 08/24/2019, no acute findings Plan: -Continue monitoring -Treat treatable course above. -Continue NG tube for now.  Placed 08/21/2019.  We will need to strongly consider PEG tube placement for continuing enteral feeds if mental status does not improve if this is within the family's goals of care -Lengthy conversation with the daughter at bedside today 08/24/2019.  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  Her ability to recover is greatly in question at this time.  Prognosis is poor.  Daughter seems aware of current state however states that patient was previously very high functioning.  Undecided on PEG tube at this time however if we are to continue with aggressive care would likely need  a PEG sometime in the next few days. -Plan of care discussed with palliative care NP Crystal and SLP.  Patient failed speech evaluation bedside and recommending strict n.p.o. status for now.  Speech therapy spoke to the  patient's wife per MD request.  Daughter would like [place] to give the patient more time post ICU stay however she understands the skilled speech therapy services are appropriate only when patient is able to consistently perceive ice chips at her lips.  At this time she is unable to do so. -Overall prognosis remains grim.  Patient's ability to recover is greatly in question.  A. fib with RVR/history of paroxysmal SVT on amiodarone, sotalol and Coreg which have been on hold since admission.  RVR resolved.  TSH 0.014.  Free T4 1.28. -Continue propranolol in the setting of hyperthyroidism -Added Cardizem 30 mg every 6 hours -No amiodarone, sotalol and anticoagulation per cardiology  Hyperthyroidism: TSH 0.014.  Free T4 1.28. -Continue p.o. propranolol -Avoid amiodarone.  Dysphagia -Continue NG tube feeding.   - She may need G-tube if family wish to continue current scope of care. -SLP evaluation ordered 08/25/2019: Strict n.p.o.  Chronic diastolic CHF:  Echo in 1025 with EF of 65 to 70%, G1 DD and PA PP of 41.  Appears euvolemic except some trace edema.   she is on Demadex 20 mg twice daily at home.   Adequate urine output on Demadex.  Renal function stable. Plan: -Continue Demadex 20 mg daily -Monitor fluid status and renal function  Uncontrolled hypertension:  Now normotensive. -Continue propranolol, Cardizem and torsemide  Acute kidney injury, resolved  thought to be due to ATN.   Renal ultrasound reassuring.   Resolved. -Monitor intermittently  CAD-seems stable. -Resume home medications  Goals of care-patient is very sick.  Grim prognosis.  She is currently partial code with no CPR or intubation but NIPPV and ACLS meds.  Palliative medicine involved.   Continuing discussions with family regarding goals of care. -follow palliative medicine recommendations   DVT prophylaxis: Lovenox Code Status: Partial Family Communication: Daughter at bedside 8/12 Disposition Plan:  Status is: Inpatient  Remains inpatient appropriate because:Inpatient level of care appropriate due to severity of illness   Dispo: The patient is from: Home              Anticipated d/c is to: SNF              Anticipated d/c date is: > 3 days              Patient currently is not medically stable to d/c.  Patient remains markedly encephalopathic.  She is unable to speak no raise her arms.  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  Her ability to recover is greatly in question.  Currently in talks with the patient's family as well as palliative care service to determine goals of care and appropriate course of action going forward.  I have a lengthy discussions with the patient's daughter regarding PEG tube.  Per the daughter the patient most likely would not want to have a PEG tube placed.  She is currently with NG tube in place and strict n.p.o. status.  She remains high risk for aspiration.  Palliative care to follow-up regarding family wishes and goals of care.  Consultants:   None  Procedures:   Endotracheal intubation, now extubated  Antimicrobials:   None   Subjective: Patient seen and examined.  Verbally unresponsive.  Does not head yes  or no.  Visibly does not appear in distress.  Objective: Vitals:   08/25/19 1554 08/25/19 2300 08/26/19 0535 08/26/19 0732  BP:  138/67  128/81  Pulse: 82 76  78  Resp:  17  13  Temp:  99.4 F (37.4 C)  98.4 F (36.9 C)  TempSrc:  Axillary  Oral  SpO2: 100% 100%  100%  Weight:   81.6 kg   Height:        Intake/Output Summary (Last 24 hours) at 08/26/2019 1138 Last data filed at 08/26/2019 0949 Gross per 24 hour  Intake 453.77 ml  Output 1450 ml  Net -996.23 ml   Filed Weights   08/23/19 0400 08/25/19 0558 08/26/19 0535  Weight: 84.8 kg 79.9 kg 81.6 kg    Examination: General: Lethargic, encephalopathic HEENT: Normocephalic, atraumatic Neck, supple, trachea midline, no tenderness Heart: Regular  rate and rhythm, S1/S2 normal, no murmurs Lungs: Bibasilar crackles.  Normal work of breathing.  Room air Abdomen: Soft, nontender, nondistended, positive bowel sounds, NG tube in place Extremities: Atraumatic.  No clubbing or cyanosis.  Muscle wasting noted bilaterally Skin: No rashes or lesions, normal color Neurologic: Unable to assess due to encephalopathy Psychiatric: Unable to assess due to encephalopathy   Data Reviewed: I have personally reviewed following labs and imaging studies  CBC: Recent Labs  Lab 08/22/19 0602 08/23/19 0402 08/24/19 0352 08/25/19 0438 08/26/19 0500  WBC 9.8 11.1* 7.5 7.3 8.9  NEUTROABS 7.6 8.4* 5.5 5.4 6.4  HGB 12.0 10.9* 11.3* 11.3* 12.2  HCT 39.1 34.9* 34.3* 36.1 38.8  MCV 96.1 94.3 91.5 93.8 93.3  PLT 154 185 148* 128* 102   Basic Metabolic Panel: Recent Labs  Lab 08/22/19 0602 08/23/19 0402 08/24/19 0352 08/25/19 0438 08/26/19 0500  NA 143 144 143 140 137  K 5.1 4.5 4.3 4.1 4.8  CL 116* 114* 108 101 98  CO2 21* 23 28 29 31   GLUCOSE 157* 172* 178* 182* 241*  BUN 33* 33* 32* 25* 34*  CREATININE 0.77 0.75 0.76 0.46  0.54 0.63  CALCIUM 8.3* 8.5* 8.4* 8.3* 8.5*  MG 2.3 1.8 1.9 1.5* 2.0  PHOS 2.7 2.0* 2.2* 2.5 2.7   GFR: Estimated Creatinine Clearance: 53.7 mL/min (by C-G formula based on SCr of 0.63 mg/dL). Liver Function Tests: Recent Labs  Lab 08/22/19 0602 08/23/19 0402 08/24/19 0352 08/25/19 0438 08/26/19 0500  ALBUMIN 2.1* 2.2* 2.1* 2.0* 2.1*   No results for input(s): LIPASE, AMYLASE in the last 168 hours. No results for input(s): AMMONIA in the last 168 hours. Coagulation Profile: No results for input(s): INR, PROTIME in the last 168 hours. Cardiac Enzymes: No results for input(s): CKTOTAL, CKMB, CKMBINDEX, TROPONINI in the last 168 hours. BNP (last 3 results) No results for input(s): PROBNP in the last 8760 hours. HbA1C: No results for input(s): HGBA1C in the last 72 hours. CBG: Recent Labs  Lab  08/25/19 1725 08/25/19 2017 08/25/19 2357 08/26/19 0358 08/26/19 0735  GLUCAP 208* 196* 164* 212* 124*   Lipid Profile: No results for input(s): CHOL, HDL, LDLCALC, TRIG, CHOLHDL, LDLDIRECT in the last 72 hours. Thyroid Function Tests: No results for input(s): TSH, T4TOTAL, FREET4, T3FREE, THYROIDAB in the last 72 hours. Anemia Panel: No results for input(s): VITAMINB12, FOLATE, FERRITIN, TIBC, IRON, RETICCTPCT in the last 72 hours. Sepsis Labs: No results for input(s): PROCALCITON, LATICACIDVEN in the last 168 hours.  No results found for this or any previous visit (from the past 240 hour(s)).  Radiology Studies: No results found.      Scheduled Meds: . atorvastatin  10 mg Per Tube Daily  . chlorhexidine gluconate (MEDLINE KIT)  15 mL Mouth Rinse BID  . Chlorhexidine Gluconate Cloth  6 each Topical Daily  . diltiazem  30 mg Per Tube Q6H  . donepezil  10 mg Per Tube QHS  . enoxaparin (LOVENOX) injection  40 mg Subcutaneous Q24H  . feeding supplement (PROSource TF)  45 mL Per Tube Daily  . free water  60 mL Per Tube Q4H  . insulin aspart  0-20 Units Subcutaneous Q4H  . memantine  5 mg Per Tube BID  . propranolol  20 mg Per Tube TID  . propylthiouracil  50 mg Per NG tube Q8H  . sertraline  25 mg Per Tube Daily  . sodium chloride flush  3 mL Intravenous Q12H  . torsemide  20 mg Per Tube BID   Continuous Infusions: . sodium chloride 250 mL (08/10/19 1547)  . sodium chloride Stopped (08/23/19 1120)  . feeding supplement (OSMOLITE 1.5 CAL) 1,000 mL (08/25/19 1500)     LOS: 29 days    Time spent: 15 minutes    Sidney Ace, MD [facility] Pager [phone]  If 7PM-7AM, please contact night-coverage 08/26/2019, 11:38 AM

## 2019-08-26 NOTE — Progress Notes (Signed)
PHARMACY CONSULT NOTE  Pharmacy Consult for Electrolyte Monitoring and Replacement   Recent Labs: Potassium (mmol/L)  Date Value  08/26/2019 4.8  02/11/2014 4.3   Magnesium (mg/dL)  Date Value  23/34/3568 2.0  02/07/2014 1.8   Calcium (mg/dL)  Date Value  61/68/3729 8.5 (L)   Calcium, Total (mg/dL)  Date Value  02/22/1550 9.4   Albumin (g/dL)  Date Value  08/14/2334 2.1 (L)  02/11/2014 2.9 (L)   Phosphorus (mg/dL)  Date Value  01/04/4974 2.7   Sodium (mmol/L)  Date Value  08/26/2019 137  09/18/2014 142  02/11/2014 139   Corrected Ca: 9.9 mg/dL   Assessment: 85 year old female admitted to the ICU with angioedema s/t ACE inhibitor use. Angioedema improved, patient with encephalopathy and significant hyperthyroidism.She was extubated on 08/18/19. Transferred out of the ICU on 8/8.   Pharmacy to manage electrolytes.  Goal of Therapy:  Electrolytes WNL  Plan:  Electrolytes WNL today. Currently ordered daily. Will continue to follow along for now.   Pricilla Riffle, PharmD Clinical Pharmacist 08/26/2019 9:20 AM

## 2019-08-26 NOTE — Progress Notes (Signed)
°  Speech Language Pathology Treatment: Dysphagia  Patient Details Name: Melanie Lewis MRN: [id number] DOB: [birth year] Today's Date: 08/26/2019 Time: 1191-4782 SLP Time Calculation (min) (ACUTE ONLY): 20 min  Assessment / Plan / Recommendation Clinical Impression  Despite maximal support by SLP, pt only moved briefly to her name. Meaningful arousal was not observed. In an effort to promote more functional arousal, I gently place an ice chip to pt's lips. No response was observed. Recommend continue NPO status with nursing able to administer ice chips after oral care when pt is appropriately alert.   This Clinical research associate spoke with pt's daughter via phone. We spoke of daughter's request to have ST re-evaluated pt this morning. I shared with her that pt didn't preceive any stimulation to her face or lips. I attempted an ice chip gently rubbed to pt's outer lip. Pt didn't have a response. I shared with donzella that ST intervention is no longer indicated. I have established a plan with nursing (MD aware) for them to present ice chips to pt's lips when pt is appropriately aroused.  Pt will be appropriate for another ST consult when she is able to consistently and appropriately perceive ice chip at lips. While pt's daughter wants [place] "to give her more time" post ICU stay, she voiced understanding that skilled ST services are only appropriate when the pt is able to consistently and appropriately perceive ice chips at her lips.     HPI HPI: Patient is a 85 year old female with history of dementia, paroxysmal SVT, diastolic CHF, CAD, HTN and hypothyroidism admitted to ICU on 07/28/2019 with acute respiratory failure in the setting of severe angioedema from ACE inhibitor's requiring intubation with mechanical ventilation until 08/18/2019. Hospital course complicated by intermittent A. fib with RVR and acute metabolic encephalopathy. Palliative medicine involved. Patient verbally unreponsive, does open eyes on command and  respond with head shakes/nods to occassional y/n ques. Currently on tube feed via NG tube, however discussion re: appropriateness to resume PO diet. SLP to complete BSE today per MD order.      SLP Plan  Discharge SLP treatment due to (comment) (severity of cognitive deficits)       Recommendations  Diet recommendations: NPO (nursing can try ice chips after oral care) Medication Administration: Via alternative means                Oral Care Recommendations: Oral care prior to ice chip/H20 Follow up Recommendations:  (Palliative Care/Hospice) SLP Visit Diagnosis: Dysphagia, oral phase (R13.11) Plan: Discharge SLP treatment due to (comment) (severity of cognitive deficits)       GO               Hermon Zea B. Dreama Saa M.S., CCC-SLP, [facility] Speech-Language Pathologist Rehabilitation Services Office [phone]  Reuel Derby 08/26/2019, 9:57 AM

## 2019-08-26 NOTE — Progress Notes (Signed)
Occupational Therapy Treatment Patient Details Name: ERISA MEHLMAN MRN: [id number] DOB: 1934/11/26 Today's Date: 08/26/2019    History of present illness Pt is an 85 y.o. female presenting to hospital 7/16 with lip swelling, tongue swelling, and some neck fullness; pt with SOB and difficulty breathing; intubated in ED emergently.  Pt admitted with severe acute hypoxic and hypercapnic respiratory failure from acute angioedema leading to severe airway obstruction; also AKI/renal failure.  During hospitalization pt found to have a-fib with RVR; pt also noted with metabolic encephalopathy.  PMH includes htn, CAD, CHF, dementia, heart attack, MI, bradycardia, PSVT.   OT comments  Pt. tolerated gentle PROM to the BUEs. Upon arrival, Pt. presented with lateral neck flexion to the right. Pt. Was assisted with repositioning of the head, and neck in midline as well as the BUEs for comfort. Pt. with limited ability to engage in the session, and is unable to follow directions/commands. Pt. continues to benefit from OT services for ADL training, positioning, UE ther. Ex, and pt. education about home modification, and DME.  Pt. would benefit from SNF level of care upon discharge, with follow-up OT services.  Follow Up Recommendations       Equipment Recommendations  SNF   Recommendations for Other Services      Precautions / Restrictions Precautions Precautions: Fall Restrictions Weight Bearing Restrictions: No       Mobility Bed Mobility                  Transfers                 General transfer comment: Deferred    Balance                                           ADL either performed or assessed with clinical judgement   ADL                                         General ADL Comments: Total A for ADLs,     Vision       Perception     Praxis      Cognition Arousal/Alertness: Lethargic Behavior During Therapy: Flat  affect Overall Cognitive Status: Difficult to assess Area of Impairment: Following commands                                        Exercises     Shoulder Instructions       General Comments      Pertinent Vitals/ Pain          Home Living                                          Prior Functioning/Environment              Frequency  Min 1X/week        Progress Toward Goals  OT Goals(current goals can now be found in the care plan section)  Progress towards OT goals: Progressing toward goals  Acute Rehab OT Goals OT Goal Formulation: With family Potential  to Achieve Goals: Fair  Plan      Co-evaluation                 AM-PAC OT "6 Clicks" Daily Activity     Outcome Measure   Help from another person eating meals?: Total Help from another person taking care of personal grooming?: Total Help from another person toileting, which includes using toliet, bedpan, or urinal?: Total Help from another person bathing (including washing, rinsing, drying)?: Total Help from another person to put on and taking off regular upper body clothing?: Total Help from another person to put on and taking off regular lower body clothing?: Total 6 Click Score: 6    End of Session    OT Visit Diagnosis: Other abnormalities of gait and mobility (R26.89);Muscle weakness (generalized) (M62.81)   Activity Tolerance Other (comment)   Patient Left in bed;with call bell/phone within reach;with bed alarm set;with family/visitor present   Nurse Communication Other (comment)        Time: 1040-1055 OT Time Calculation (min): 15 min  Charges: OT General Charges $OT Visit: 1 Visit OT Treatments $Self Care/Home Management : 8-22 mins  Olegario Messier, MS, OTR/L   Olegario Messier 08/26/2019, 11:31 AM

## 2019-08-27 DIAGNOSIS — T783XXD Angioneurotic edema, subsequent encounter: Secondary | ICD-10-CM | POA: Diagnosis not present

## 2019-08-27 LAB — RENAL FUNCTION PANEL
Albumin: 2 g/dL — ABNORMAL LOW (ref 3.5–5.0)
Anion gap: 12 (ref 5–15)
BUN: 42 mg/dL — ABNORMAL HIGH (ref 8–23)
CO2: 29 mmol/L (ref 22–32)
Calcium: 8.4 mg/dL — ABNORMAL LOW (ref 8.9–10.3)
Chloride: 98 mmol/L (ref 98–111)
Creatinine, Ser: 0.86 mg/dL (ref 0.44–1.00)
GFR calc Af Amer: >60 mL/min (ref >60)
GFR calc non Af Amer: >60 mL/min (ref >60)
Glucose, Bld: 223 mg/dL — ABNORMAL HIGH (ref 70–99)
Phosphorus: 2.5 mg/dL (ref 2.5–4.6)
Potassium: 4.8 mmol/L (ref 3.5–5.1)
Sodium: 139 mmol/L (ref 135–145)

## 2019-08-27 LAB — CBC WITH DIFFERENTIAL/PLATELET
Abs Immature Granulocytes: 0.15 10*3/uL — ABNORMAL HIGH (ref 0.00–0.07)
Basophils Absolute: 0 10*3/uL (ref 0.0–0.1)
Basophils Relative: 0 %
Eosinophils Absolute: 0 10*3/uL (ref 0.0–0.5)
Eosinophils Relative: 0 %
HCT: 36.7 % (ref 36.0–46.0)
Hemoglobin: 11.5 g/dL — ABNORMAL LOW (ref 12.0–15.0)
Immature Granulocytes: 2 %
Lymphocytes Relative: 18 %
Lymphs Abs: 1.5 10*3/uL (ref 0.7–4.0)
MCH: 29.8 pg (ref 26.0–34.0)
MCHC: 31.3 g/dL (ref 30.0–36.0)
MCV: 95.1 fL (ref 80.0–100.0)
Monocytes Absolute: 0.9 10*3/uL (ref 0.1–1.0)
Monocytes Relative: 11 %
Neutro Abs: 5.6 10*3/uL (ref 1.7–7.7)
Neutrophils Relative %: 69 %
Platelets: 118 10*3/uL — ABNORMAL LOW (ref 150–400)
RBC: 3.86 MIL/uL — ABNORMAL LOW (ref 3.87–5.11)
RDW: 14.8 % (ref 11.5–15.5)
Smear Review: UNDETERMINED
WBC: 8.1 10*3/uL (ref 4.0–10.5)
nRBC: 0.2 % (ref 0.0–0.2)

## 2019-08-27 LAB — GLUCOSE, CAPILLARY
Glucose-Capillary: 180 mg/dL — ABNORMAL HIGH (ref 70–99)
Glucose-Capillary: 182 mg/dL — ABNORMAL HIGH (ref 70–99)
Glucose-Capillary: 199 mg/dL — ABNORMAL HIGH (ref 70–99)
Glucose-Capillary: 202 mg/dL — ABNORMAL HIGH (ref 70–99)
Glucose-Capillary: 202 mg/dL — ABNORMAL HIGH (ref 70–99)
Glucose-Capillary: 229 mg/dL — ABNORMAL HIGH (ref 70–99)
Glucose-Capillary: 234 mg/dL — ABNORMAL HIGH (ref 70–99)

## 2019-08-27 LAB — MAGNESIUM: Magnesium: 2 mg/dL (ref 1.7–2.4)

## 2019-08-27 NOTE — Progress Notes (Signed)
PHARMACY CONSULT NOTE  Pharmacy Consult for Electrolyte Monitoring and Replacement   Recent Labs: Potassium (mmol/L)  Date Value  08/27/2019 4.8  02/11/2014 4.3   Magnesium (mg/dL)  Date Value  59/53/9672 2.0  02/07/2014 1.8   Calcium (mg/dL)  Date Value  89/79/1504 8.4 (L)   Calcium, Total (mg/dL)  Date Value  13/64/3837 9.4   Albumin (g/dL)  Date Value  79/39/6886 2.0 (L)  02/11/2014 2.9 (L)   Phosphorus (mg/dL)  Date Value  48/47/2072 2.5   Sodium (mmol/L)  Date Value  08/27/2019 139  09/18/2014 142  02/11/2014 139   Corrected Ca: 9.9 mg/dL   Assessment: 85 year old female admitted to the ICU with angioedema s/t ACE inhibitor use. Angioedema improved, patient with encephalopathy and significant hyperthyroidism.She was extubated on 08/18/19. Transferred out of the ICU on 8/8.   Pharmacy to manage electrolytes.  Goal of Therapy:  Electrolytes WNL  Plan:  Patient no longer requiring frequent replacement. Discontinue pharmacy consult.   Pricilla Riffle, PharmD Clinical Pharmacist 08/27/2019 7:47 AM

## 2019-08-27 NOTE — Progress Notes (Signed)
PROGRESS NOTE    Melanie Lewis  PTW:[id number] DOB: 1934/05/08 DOA: 07/28/2019 PCP: Leonel Ramsay, MD  Brief Narrative: 85 year old female with history of dementia, paroxysmal SVT, diastolic CHF, CAD, HTN and hypothyroidism admitted to ICU on 07/28/2019 with acute respiratory failure in the setting of severe angioedema from ACE inhibitor's requiring intubation with mechanical ventilation until 08/19/2019.   Hospital course complicated by intermittent A. fib with RVR and acute metabolic encephalopathy.  Cardiology and neurology consulted.  MRI brain negative for acute intracranial finding.  Neurology query "apathic hyperthyroidism".  She was a started on Provigil with minimal improvement in her mental status.  Palliative medicine involved.  Patient was transferred to [facility] on 08/21/2019.  Remains encephalopathic.  On tube feed via an NG tube.   8/11: Patient seen and examined.  Appears to be trying to speak but 2-week.  NG tube remains in place.  Tube feeds running at 50 cc/h.  Patient does have some comprehension.  Nods her head yes and no appropriately to interview questions.  No visualized distress  8/12: Patient seen and examined.  Remains verbally unresponsive.  Is able to nod head yes or no to simple questions.  Daughter at bedside.  Lengthy conversation about patient's current status and the uncertainty regarding her recovery potential.  Daughter undecided on PEG tube.  8/13: Patient seen and examined.  Verbally unresponsive.  Does open eyes on command.  Unable to lift arms or speak.  Does nod head yes or no when asked simple questions.  No family members at bedside this morning.  The daughter and I spoke about PEG tube placement yesterday.  She is still undecided.  8/14: Patient seen and examined.  Remains verbally unresponsive.  Does open eyes on command.  I had a long conversation with speech therapist today.  She reach out to the patient's wife addressing the  concerns.  I am recommending strict n.p.o. given patient's nearly complete lack of ability to open her mouth and protect her airway when eating.  Metabolic panel and hemogram largely within normal limits.  No acute decompensating events overnight.  Patient remains very grim prognosis and high risk for further decompensation.  8/15: Patient seen and examined.  Remains verbally unresponsive.  On prompting she seemed like she was trying to speak today.  Does open eyes on command.  Nods head yes or no.  Still completely encephalopathic though.  Metabolic profile and hemodynamics within normal limits.  No decompensating events overnight.  Remains on tube feeds at 60 cc/h.  Patient remains high risk for further decompensation.  Overall prognosis poor.    Assessment & Plan:   Active Problems:   Angioedema  Acute respiratory failure with hypoxia in the setting of severe angioedema, resolved Presumed due to ACE inhibitor -ETT 7/16-8/7.   -Weaned off nasal cannula  Completed steroid taper -Discontinued home lisinopril and added to allergy list  Acute metabolic encephalopathy in patient with history of dementia remains encephalopathic.   Appears to be attempting to respond verbally Does open eyes and answers appropriately by nodding her head  MRI brain without acute finding.  Neurology signed off. Repeat CT head on 08/24/2019, no acute findings Plan: -Continue monitoring -Treat treatable course above. -Continue NG tube for now.  Placed 08/21/2019.  We will need to strongly consider PEG tube placement for continuing enteral feeds if mental status does not improve if this is within the family's goals of care -Lengthy conversation with the daughter at bedside today 08/24/2019.  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  Her ability to recover is greatly in question at this time.  Prognosis is poor.   Daughter seems aware of current state however states that patient was previously very high functioning.  Undecided on PEG tube at this time however if we are to continue with aggressive care would likely need a PEG sometime in the next few days.  -Plan of care discussed with palliative care NP Crystal and SLP.  Patient failed speech evaluation bedside and recommending strict n.p.o. status for now.  Speech therapy spoke to the patient's wife per MD request.  Daughter would like [place] to give the patient more time post ICU stay however she understands the skilled speech therapy services are appropriate only when patient is able to consistently perceive ice chips at her lips.  At this time she is unable to do so.  -Overall prognosis remains grim.  Patient's ability to recover is greatly in question.  A. fib with RVR/history of paroxysmal SVT on amiodarone, sotalol and Coreg which have been on hold since admission.  RVR resolved.  TSH 0.014.  Free T4 1.28. -Continue propranolol in the setting of hyperthyroidism -Added Cardizem 30 mg every 6 hours -No amiodarone, sotalol and anticoagulation per cardiology  Hyperthyroidism: TSH 0.014.  Free T4 1.28. -Continue p.o. propranolol -Avoid amiodarone.  Dysphagia -Continue NG tube feeding.   - She may need G-tube if family wish to continue current scope of care. -SLP evaluation ordered 08/25/2019: Strict n.p.o.  Chronic diastolic CHF:  Echo in 0865 with EF of 65 to 70%, G1 DD and PA PP of 41.  Appears euvolemic except some trace edema.   she is on Demadex 20 mg twice daily at home.   Adequate urine output on Demadex.  Renal function stable. Plan: -Continue Demadex 20 mg daily -Monitor fluid status and renal function  Uncontrolled hypertension:  Now normotensive. -Continue propranolol, Cardizem and torsemide  Acute kidney injury, resolved  thought to be due to ATN.   Renal ultrasound reassuring.   Resolved. -Monitor  intermittently  CAD-seems stable. -Resume home medications  Goals of care-patient is very sick.  Grim prognosis.  She is currently partial code with no CPR or intubation but NIPPV and ACLS meds.  Palliative medicine involved.   Continuing discussions with family regarding goals of care. -follow palliative medicine recommendations   DVT prophylaxis: Lovenox Code Status: Partial Family Communication: Daughter at bedside 8/12 Disposition Plan: Status is: Inpatient  Remains inpatient appropriate because:Inpatient level of care appropriate due to severity of illness   Dispo: The patient is from: Home              Anticipated d/c is to: SNF              Anticipated d/c date is: > 3 days              Patient currently is not medically stable to d/c.  Patient remains very ill.  Markedly encephalopathic.  Severe critical illness myopathy encephalopathy.  No clearly reversible cause.  Suspect this is secondary to intubation and likely ICU stay.  Unfortunately feel that her long-term prognosis is very poor.  Consultants:   None  Procedures:   Endotracheal intubation, now extubated  Antimicrobials:   None   Subjective: Patient seen and examined.  Looks like she is attempting to speak but is unable.  Still cannot raise her arms.  Objective: Vitals:   08/26/19 0732 08/26/19 1538 08/27/19 0023 08/27/19 0746  BP: 128/81 139/71 (!) 146/70 127/68  Pulse: 78 77 (!) 47 95  Resp: 13 19 20 20   Temp: 98.4 F (36.9 C) 98.8 F (37.1 C) 98.9 F (37.2 C) 99.3 F (37.4 C)  TempSrc: Oral Oral Oral Oral  SpO2: 100% 100% 100% 100%  Weight:      Height:        Intake/Output Summary (Last 24 hours) at 08/27/2019 1228 Last data filed at 08/27/2019 0500 Gross per 24 hour  Intake --  Output 900 ml  Net -900 ml   Filed Weights   08/23/19 0400 08/25/19 0558 08/26/19 0535  Weight: 84.8 kg 79.9 kg 81.6 kg    Examination:  General:Lethargic, encephalopathic HEENT: Normocephalic,  atraumatic Neck, supple, trachea midline, no tenderness Heart: Regular rate and rhythm, S1/S2 normal, no murmurs Lungs:Bibasilar crackles. Normal work of breathing. Room air Abdomen: Soft, nontender, nondistended, positive bowel sounds, NG tube in place Extremities:Atraumatic. No clubbing or cyanosis. Muscle wasting noted bilaterally Skin: No rashes or lesions, normal color Neurologic:Unable to assess due to encephalopathy Psychiatric:Unable to assess due to encephalopathy    Data Reviewed: I have personally reviewed following labs and imaging studies  CBC: Recent Labs  Lab 08/23/19 0402 08/24/19 0352 08/25/19 0438 08/26/19 0500 08/27/19 0536  WBC 11.1* 7.5 7.3 8.9 8.1  NEUTROABS 8.4* 5.5 5.4 6.4 5.6  HGB 10.9* 11.3* 11.3* 12.2 11.5*  HCT 34.9* 34.3* 36.1 38.8 36.7  MCV 94.3 91.5 93.8 93.3 95.1  PLT 185 148* 128* 151 606*   Basic Metabolic Panel: Recent Labs  Lab 08/23/19 0402 08/24/19 0352 08/25/19 0438 08/26/19 0500 08/27/19 0536  NA 144 143 140 137 139  K 4.5 4.3 4.1 4.8 4.8  CL 114* 108 101 98 98  CO2 23 28 29 31 29   GLUCOSE 172* 178* 182* 241* 223*  BUN 33* 32* 25* 34* 42*  CREATININE 0.75 0.76 0.46  0.54 0.63 0.86  CALCIUM 8.5* 8.4* 8.3* 8.5* 8.4*  MG 1.8 1.9 1.5* 2.0 2.0  PHOS 2.0* 2.2* 2.5 2.7 2.5   GFR: Estimated Creatinine Clearance: 49.9 mL/min (by C-G formula based on SCr of 0.86 mg/dL). Liver Function Tests: Recent Labs  Lab 08/23/19 0402 08/24/19 0352 08/25/19 0438 08/26/19 0500 08/27/19 0536  ALBUMIN 2.2* 2.1* 2.0* 2.1* 2.0*   No results for input(s): LIPASE, AMYLASE in the last 168 hours. No results for input(s): AMMONIA in the last 168 hours. Coagulation Profile: No results for input(s): INR, PROTIME in the last 168 hours. Cardiac Enzymes: No results for input(s): CKTOTAL, CKMB, CKMBINDEX, TROPONINI in the last 168 hours. BNP (last 3 results) No results for input(s): PROBNP in the last 8760 hours. HbA1C: No results for  input(s): HGBA1C in the last 72 hours. CBG: Recent Labs  Lab 08/26/19 2018 08/27/19 0026 08/27/19 0442 08/27/19 0748 08/27/19 1154  GLUCAP 193* 229* 202* 199* 202*   Lipid Profile: No results for input(s): CHOL, HDL, LDLCALC, TRIG, CHOLHDL, LDLDIRECT in the last 72 hours. Thyroid Function Tests: No results for input(s): TSH, T4TOTAL, FREET4, T3FREE, THYROIDAB in the last 72 hours. Anemia Panel: No results for input(s): VITAMINB12, FOLATE, FERRITIN, TIBC, IRON, RETICCTPCT in the last 72 hours. Sepsis Labs: No results for input(s): PROCALCITON, LATICACIDVEN in the last 168 hours.  No results found for this or any previous visit (from the past 240 hour(s)).       Radiology Studies: No results found.      Scheduled  Meds: . atorvastatin  10 mg Per Tube Daily  . chlorhexidine gluconate (MEDLINE KIT)  15 mL Mouth Rinse BID  . Chlorhexidine Gluconate Cloth  6 each Topical Daily  . diltiazem  30 mg Per Tube Q6H  . donepezil  10 mg Per Tube QHS  . enoxaparin (LOVENOX) injection  40 mg Subcutaneous Q24H  . feeding supplement (PROSource TF)  45 mL Per Tube Daily  . free water  60 mL Per Tube Q4H  . insulin aspart  0-20 Units Subcutaneous Q4H  . memantine  5 mg Per Tube BID  . propranolol  20 mg Per Tube TID  . propylthiouracil  50 mg Per NG tube Q8H  . sertraline  25 mg Per Tube Daily  . sodium chloride flush  3 mL Intravenous Q12H  . torsemide  20 mg Per Tube BID   Continuous Infusions: . sodium chloride 250 mL (08/10/19 1547)  . sodium chloride Stopped (08/23/19 1120)  . feeding supplement (OSMOLITE 1.5 CAL) 1,000 mL (08/25/19 1500)     LOS: 30 days    Time spent: 15 minutes    Sidney Ace, MD [facility] Pager [phone]  If 7PM-7AM, please contact night-coverage 08/27/2019, 12:28 PM

## 2019-08-28 ENCOUNTER — Inpatient Hospital Stay: Payer: Medicare Other

## 2019-08-28 DIAGNOSIS — Z515 Encounter for palliative care: Secondary | ICD-10-CM | POA: Diagnosis not present

## 2019-08-28 DIAGNOSIS — Z7189 Other specified counseling: Secondary | ICD-10-CM | POA: Diagnosis not present

## 2019-08-28 DIAGNOSIS — T783XXD Angioneurotic edema, subsequent encounter: Secondary | ICD-10-CM | POA: Diagnosis not present

## 2019-08-28 LAB — CBC WITH DIFFERENTIAL/PLATELET
Abs Immature Granulocytes: 0.23 10*3/uL — ABNORMAL HIGH (ref 0.00–0.07)
Basophils Absolute: 0 10*3/uL (ref 0.0–0.1)
Basophils Relative: 0 %
Eosinophils Absolute: 0 10*3/uL (ref 0.0–0.5)
Eosinophils Relative: 0 %
HCT: 39.1 % (ref 36.0–46.0)
Hemoglobin: 12.2 g/dL (ref 12.0–15.0)
Immature Granulocytes: 2 %
Lymphocytes Relative: 13 %
Lymphs Abs: 1.9 10*3/uL (ref 0.7–4.0)
MCH: 29.4 pg (ref 26.0–34.0)
MCHC: 31.2 g/dL (ref 30.0–36.0)
MCV: 94.2 fL (ref 80.0–100.0)
Monocytes Absolute: 1.4 10*3/uL — ABNORMAL HIGH (ref 0.1–1.0)
Monocytes Relative: 9 %
Neutro Abs: 11.6 10*3/uL — ABNORMAL HIGH (ref 1.7–7.7)
Neutrophils Relative %: 76 %
Platelets: 183 10*3/uL (ref 150–400)
RBC: 4.15 MIL/uL (ref 3.87–5.11)
RDW: 14.8 % (ref 11.5–15.5)
WBC: 15.1 10*3/uL — ABNORMAL HIGH (ref 4.0–10.5)
nRBC: 0.3 % — ABNORMAL HIGH (ref 0.0–0.2)

## 2019-08-28 LAB — URINALYSIS, COMPLETE (UACMP) WITH MICROSCOPIC
Bilirubin Urine: NEGATIVE
Glucose, UA: NEGATIVE mg/dL
Ketones, ur: NEGATIVE mg/dL
Nitrite: POSITIVE — AB
Protein, ur: NEGATIVE mg/dL
Specific Gravity, Urine: 1.015 (ref 1.005–1.030)
Squamous Epithelial / HPF: NONE SEEN (ref 0–5)
pH: 5 (ref 5.0–8.0)

## 2019-08-28 LAB — RENAL FUNCTION PANEL
Albumin: 2.2 g/dL — ABNORMAL LOW (ref 3.5–5.0)
Anion gap: 11 (ref 5–15)
BUN: 38 mg/dL — ABNORMAL HIGH (ref 8–23)
CO2: 32 mmol/L (ref 22–32)
Calcium: 8.7 mg/dL — ABNORMAL LOW (ref 8.9–10.3)
Chloride: 96 mmol/L — ABNORMAL LOW (ref 98–111)
Creatinine, Ser: 0.67 mg/dL (ref 0.44–1.00)
GFR calc Af Amer: >60 mL/min (ref >60)
GFR calc non Af Amer: >60 mL/min (ref >60)
Glucose, Bld: 244 mg/dL — ABNORMAL HIGH (ref 70–99)
Phosphorus: 2.4 mg/dL — ABNORMAL LOW (ref 2.5–4.6)
Potassium: 5 mmol/L (ref 3.5–5.1)
Sodium: 139 mmol/L (ref 135–145)

## 2019-08-28 LAB — GLUCOSE, CAPILLARY
Glucose-Capillary: 237 mg/dL — ABNORMAL HIGH (ref 70–99)
Glucose-Capillary: 231 mg/dL — ABNORMAL HIGH (ref 70–99)
Glucose-Capillary: 220 mg/dL — ABNORMAL HIGH (ref 70–99)
Glucose-Capillary: 155 mg/dL — ABNORMAL HIGH (ref 70–99)
Glucose-Capillary: 147 mg/dL — ABNORMAL HIGH (ref 70–99)

## 2019-08-28 LAB — PROCALCITONIN: Procalcitonin: 0.53 ng/mL

## 2019-08-28 LAB — MAGNESIUM: Magnesium: 2.1 mg/dL (ref 1.7–2.4)

## 2019-08-28 MED ORDER — ACETAMINOPHEN 10 MG/ML IV SOLN
1000.0000 mg | Freq: Four times a day (QID) | INTRAVENOUS | Status: AC | PRN
  Administered 2019-08-28: 1000 mg via INTRAVENOUS
  Filled 2019-08-28: qty 100

## 2019-08-28 MED ORDER — FUROSEMIDE 10 MG/ML IJ SOLN
20.0000 mg | Freq: Two times a day (BID) | INTRAMUSCULAR | Status: DC
  Administered 2019-08-28 – 2019-08-30 (×3): 20 mg via INTRAVENOUS
  Filled 2019-08-28 (×3): qty 4

## 2019-08-28 MED ORDER — SODIUM CHLORIDE 0.9 % IV SOLN
3.0000 g | Freq: Four times a day (QID) | INTRAVENOUS | Status: DC
  Administered 2019-08-28 – 2019-08-29 (×4): 3 g via INTRAVENOUS
  Filled 2019-08-28 (×3): qty 8
  Filled 2019-08-28: qty 3
  Filled 2019-08-28: qty 8
  Filled 2019-08-28: qty 3

## 2019-08-28 MED ORDER — DILTIAZEM HCL 25 MG/5ML IV SOLN
3.0000 mg | Freq: Four times a day (QID) | INTRAVENOUS | Status: DC
  Administered 2019-08-28 – 2019-08-31 (×8): 3 mg via INTRAVENOUS
  Filled 2019-08-28 (×14): qty 5

## 2019-08-28 MED ORDER — DILTIAZEM HCL 25 MG/5ML IV SOLN: 5.0000 mg | Freq: Four times a day (QID) | INTRAVENOUS | Status: DC

## 2019-08-28 NOTE — Progress Notes (Signed)
Physical Therapy Treatment Patient Details Name: Melanie Lewis MRN: [id number] DOB: 27-Jun-1934 Today's Date: 08/28/2019    History of Present Illness Pt is an 85 y.o. female presenting to hospital 7/16 with lip swelling, tongue swelling, and some neck fullness; pt with SOB and difficulty breathing; intubated in ED emergently.  Pt admitted with severe acute hypoxic and hypercapnic respiratory failure from acute angioedema leading to severe airway obstruction; also AKI/renal failure.  During hospitalization pt found to have a-fib with RVR; pt also noted with metabolic encephalopathy.  PMH includes htn, CAD, CHF, dementia, heart attack, MI, bradycardia, PSVT.    PT Comments    Patient with decreased level of alertness during PT session. Attempts made to stimulate patient for increased arousal and active participation. Patient has mostly generalized and inconsistent response to all stimuli. Bed placed in chair position in attempts to arouse further for participation. Patient does appear to open her eyes for longer periods (no more than 30 seconds) with head of bed elevated. PROM provided to BLE with no active movement noted or participation from patient. Stimulated patient also with light touch/cool wash cloth to face, and patient again opens eyes and attempts to look to left and right to voice for brief periods. Otherwise, patient has eyes closed. Heart rate 61bpm and Sp02 97% at end of session. Head/neck positioned in neutral at end of session as patient with tendency to have left head turn. Daughter at bedside discussed patient's care with Palliative Care during PT session. Daughter expressed she wants physical therapy to continue with efforts despite patient's level of alertness. Recommend for PT to follow up again to see if patient is more alert or able to participate with therapy and to provide education to family for PROM of extremities to meet goals of care.    Follow Up Recommendations   SNF     Equipment Recommendations  None recommended by PT    Recommendations for Other Services       Precautions / Restrictions Precautions Precautions: Fall Precaution Comments: NPO; NG tube; rectal tube Restrictions Weight Bearing Restrictions: No    Mobility  Bed Mobility Overal bed mobility: Needs Assistance             General bed mobility comments: total assistance for bed mobility for repositioning. no active participation provided by patient   Transfers                 General transfer comment: Deferred  Ambulation/Gait                 Stairs             Wheelchair Mobility    Modified Rankin (Stroke Patients Only)       Balance                                            Cognition Arousal/Alertness: Lethargic Behavior During Therapy: Flat affect Overall Cognitive Status: Impaired/Different from baseline                 Rancho Levels of Cognitive Functioning Ranch[place] Mirant Scales of Cognitive Functioning: Generalized response               General Comments: patient is unable to follow commands at this time       Exercises General Exercises - Lower Extremity Ankle Circles/Pumps: PROM;Both;10 reps;Seated (from chair  position in bed ) Long Arc Quad: PROM;Both;10 reps;Seated (from chair position of bed ) Heel Slides: PROM;Both;10 reps;Seated (from chair position in bed ) Hip ABduction/ADduction: PROM;Both;10 reps;Seated (from chair position in bed ) Other Exercises Other Exercises: despite faciliation and verbal cues for participation with PROM efforts, no active participation provided by patient with ROM efforts     General Comments        Pertinent Vitals/Pain Pain Assessment: Faces Faces Pain Scale: No hurt Pain Location: patient unable to report pain at this time. no signs or symptoms of pain during session     Home Living                      Prior Function             PT Goals (current goals can now be found in the care plan section) Acute Rehab PT Goals Patient Stated Goal: patient unable to participate with goal setting  PT Goal Formulation: Patient unable to participate in goal setting Time For Goal Achievement: 09/04/19 Potential to Achieve Goals: Poor Progress towards PT goals: Not progressing toward goals - comment    Frequency    Min 2X/week      PT Plan Current plan remains appropriate    Co-evaluation              AM-PAC PT "6 Clicks" Mobility   Outcome Measure  Help needed turning from your back to your side while in a flat bed without using bedrails?: Total Help needed moving from lying on your back to sitting on the side of a flat bed without using bedrails?: Total Help needed moving to and from a bed to a chair (including a wheelchair)?: Total Help needed standing up from a chair using your arms (e.g., wheelchair or bedside chair)?: Total Help needed to walk in hospital room?: Total Help needed climbing 3-5 steps with a railing? : Total 6 Click Score: 6    End of Session Equipment Utilized During Treatment: Oxygen Activity Tolerance: Patient limited by lethargy Patient left: in bed;with call bell/phone within reach;with family/visitor present Nurse Communication: Mobility status PT Visit Diagnosis: Muscle weakness (generalized) (M62.81);Other abnormalities of gait and mobility (R26.89);Adult, failure to thrive (R62.7)     Time: 9485-4627 PT Time Calculation (min) (ACUTE ONLY): 25 min  Charges:  $Therapeutic Exercise: 8-22 mins $Therapeutic Activity: 8-22 mins                     Donna Bernard, PT, MPT    Melanie Lewis 08/28/2019, 3:33 PM

## 2019-08-28 NOTE — Progress Notes (Signed)
Provided oral suction due to pt not able to swallow puree food. Attempted to provide finely crushed medications mixed with water. Result: Little to no success. Notified MD.

## 2019-08-28 NOTE — Progress Notes (Deleted)
   08/28/19 0757  Vitals  Temp (!) 102.7 F (39.3 C)  Temp Source Axillary  BP (!) 146/75  MAP (mmHg) 95  BP Location Right Arm  BP Method Automatic  Patient Position (if appropriate) Lying  Pulse Rate 75  Pulse Rate Source Monitor  Resp 20  MEWS COLOR  MEWS Score Color Yellow  Oxygen Therapy  SpO2 99 %  O2 Device Nasal Cannula  O2 Flow Rate (L/min) 2 L/min  MEWS Score  MEWS Temp 2  MEWS Systolic 0  MEWS Pulse 0  MEWS RR 0  MEWS LOC 0  MEWS Score 2  Provider Notification  Provider Name/Title Dr. Georgeann Oppenheim  Date Provider Notified 08/28/19  Time Provider Notified [phone]  Notification Type Page  Notification Reason Other (Comment) (elevating temp/BP)  Response See new orders  Date of Provider Response 08/28/19  Time of Provider Response [phone]

## 2019-08-28 NOTE — Progress Notes (Signed)
Daughter, Melanie Lewis, at bedside. NP informed daughter regarding changes being made as of 08/28/2019. NP and MD discussed plan of care with daughter. Result: Discontinued Nasogastric feeding tube and advanced diet to puree. NP notified daughter of the outcomes and risks pt potentially face, such as aspiration, leading to development of aspiration pneumonia. Daughter verbalized understanding. NP reviewed pt's  code status"Partial" listed on chart.

## 2019-08-28 NOTE — Care Management (Signed)
Discussions today involving the patient's daughter at bedside as well as another daughter via phone and palliative care service.  Patient is clinically declining.  She is currently maintaining her airway however she began to spike fevers today prompting concern for impending sepsis likely secondary to aspiration pneumonia.  The patient has had current NG tube in place for 7days.  She had previous OG tube for extended period of time while on mechanical ventilation.  At this point we are greatly risking severe mucosal injury from long-term oral and nasogastric tube introduction.  After discussion with palliative care and the patient's daughter we will attempt to discontinue NG tube to assess level of orientation and ability to swallow.  Speech therapy has already seen the patient and recommended strict n.p.o. status.  Will convert medications to IV as appropriate.  We will hold medications at likely or not impacting the patient's clinical course at this time.  Later on received a call from bedside RN that she needed to suction apple sauce out of the patient's mouth.  At this point it is too hazardous to administer oral medications as again the risk of aspiration is very very high.  Will attempt to convert oral diltiazem 30 mg every 6 hours to intravenous form.  Heart rate control likely be an issue.  Unfortunately this patient's prognosis is quite grim.  She is shown little to no awareness and remains unable to work with therapy in any meaningful way.  Patient's daughter who is apparently a Engineer, civil (consulting) in [place] states that she is going to be in the hospital tomorrow 08/29/2019.  We will have further discussions regarding patient's clinical status, goals of care, recommended disposition.  Patient is listed as a partial code however she is a DO NOT RESUSCITATE, DO NOT INTUBATE.  We will attempt all possible measures however prognosis remains very grim.  Lolita Patella MD

## 2019-08-28 NOTE — Progress Notes (Signed)
PROGRESS NOTE    Melanie Lewis  ZGY:[id number] DOB: 26-Aug-1934 DOA: 07/28/2019 PCP: Leonel Ramsay, MD  Brief Narrative: 85 year old female with history of dementia, paroxysmal SVT, diastolic CHF, CAD, HTN and hypothyroidism admitted to ICU on 07/28/2019 with acute respiratory failure in the setting of severe angioedema from ACE inhibitor's requiring intubation with mechanical ventilation until 08/19/2019.   Hospital course complicated by intermittent A. fib with RVR and acute metabolic encephalopathy.  Cardiology and neurology consulted.  MRI brain negative for acute intracranial finding.  Neurology query "apathic hyperthyroidism".  She was a started on Provigil with minimal improvement in her mental status.  Palliative medicine involved.  Patient was transferred to [facility] on 08/21/2019.  Remains encephalopathic.  On tube feed via an NG tube.   8/11: Patient seen and examined.  Appears to be trying to speak but 2-week.  NG tube remains in place.  Tube feeds running at 50 cc/h.  Patient does have some comprehension.  Nods her head yes and no appropriately to interview questions.  No visualized distress  8/12: Patient seen and examined.  Remains verbally unresponsive.  Is able to nod head yes or no to simple questions.  Daughter at bedside.  Lengthy conversation about patient's current status and the uncertainty regarding her recovery potential.  Daughter undecided on PEG tube.  8/13: Patient seen and examined.  Verbally unresponsive.  Does open eyes on command.  Unable to lift arms or speak.  Does nod head yes or no when asked simple questions.  No family members at bedside this morning.  The daughter and I spoke about PEG tube placement yesterday.  She is still undecided.  8/14: Patient seen and examined.  Remains verbally unresponsive.  Does open eyes on command.  I had a long conversation with speech therapist today.  She reach out to the patient's wife addressing the  concerns.  I am recommending strict n.p.o. given patient's nearly complete lack of ability to open her mouth and protect her airway when eating.  Metabolic panel and hemogram largely within normal limits.  No acute decompensating events overnight.  Patient remains very grim prognosis and high risk for further decompensation.  8/15: Patient seen and examined.  Remains verbally unresponsive.  On prompting she seemed like she was trying to speak today.  Does open eyes on command.  Nods head yes or no.  Still completely encephalopathic though.  Metabolic profile and hemodynamics within normal limits.  No decompensating events overnight.  Remains on tube feeds at 60 cc/h.  Patient remains high risk for further decompensation.  Overall prognosis poor.  8/16: Patient seen and examined.  Does open eyes and nods head briefly as you know.  Remains verbally unresponsive.  Unable to raise her arms or follow any other commands.  Overnight and this morning patient had acute onset of fever.  Worsening white blood cell count.  Given Tylenol with defervescence.  Started on Unasyn for presumed aspiration.  Received notification from bedside RN that patient's daughter was requesting conference call.  I am available via phone to provide my clinical impression and opinion.  Unfortunately long-term prognosis remains very poor.    Assessment & Plan:   Active Problems:   Angioedema  New onset fever Unclear source Procalcitonin minimally elevated Suspect aspiration pneumonia given continuous tube feeds No clear infiltrate on chest x-ray however may be early in the disease course Plan: Start empiric Unasyn 3 g every 6 hours Blood cultures x2 Urine cultures Monitor fever curve  Ofirmev every 6 hours as needed fever   Acute respiratory failure with hypoxia in the setting of severe angioedema, resolved Presumed due to ACE inhibitor -ETT 7/16-8/7.   -Weaned off nasal cannula  Completed steroid taper -Discontinued  home lisinopril and added to allergy list  Acute metabolic encephalopathy in patient with history of dementia remains encephalopathic.   Appears to be attempting to respond verbally Does open eyes and answers appropriately by nodding her head  MRI brain without acute finding.  Neurology signed off. Repeat CT head on 08/24/2019, no acute findings Plan: -Continue monitoring -Treat treatable course above. -Continue NG tube for now.  Placed 08/21/2019.  We will need to strongly consider PEG tube placement for continuing enteral feeds if mental status does not improve if this is within the family's goals of care  -Lengthy conversation with the daughter at bedside 08/24/2019.  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  Her ability to recover is greatly in question at this time.  Prognosis is poor.  Daughter seems aware of current state however states that patient was previously very high functioning.  Undecided on PEG tube at this time however if we are to continue with aggressive care would likely need a PEG sometime in the next few days.  -Plan of care discussed with palliative care NP Crystal and SLP.  Patient failed speech evaluation bedside and recommending strict n.p.o. status for now.  Speech therapy spoke to the patient's wife per MD request.  Daughter would like [place] to give the patient more time post ICU stay however she understands the skilled speech therapy services are appropriate only when patient is able to consistently perceive ice chips at her lips.  At this time she is unable to do so.  -Unfortunately we are rapidly approaching a point where our interventions are likely causing more harm than good.  The patient has had a NG tube in place since 08/21/2019.  This is left in much longer but will start to cause mucosal damage so we need to have a direction.  Palliative care is following and  consistent talks with the family.  I understand there is a daughter that is requesting a conference call.  I am happy to participate and share my opinions however we do need a definitive course of direction.  A. fib with RVR/history of paroxysmal SVT on amiodarone, sotalol and Coreg which have been on hold since admission.  RVR resolved.  TSH 0.014.  Free T4 1.28. -Continue propranolol in the setting of hyperthyroidism -Added Cardizem 30 mg every 6 hours -No amiodarone, sotalol and anticoagulation per cardiology  Hyperthyroidism: TSH 0.014.  Free T4 1.28. -Continue p.o. propranolol -Avoid amiodarone.  Dysphagia -Continue NG tube feeding.   - She may need G-tube if family wish to continue current scope of care. -SLP evaluation ordered 08/25/2019: Strict n.p.o.  Chronic diastolic CHF:  Echo in 0539 with EF of 65 to 70%, G1 DD and PA PP of 41.  Appears euvolemic except some trace edema.   she is on Demadex 20 mg twice daily at home.   Adequate urine output on Demadex.  Renal function stable. Plan: -Continue Demadex 20 mg daily -Monitor fluid status and renal function  Uncontrolled hypertension:  Now normotensive. -Continue propranolol, Cardizem and torsemide  Acute kidney injury, resolved  thought to be due to ATN.   Renal ultrasound reassuring.   Resolved. -Monitor intermittently  CAD-seems stable. -Resume  home medications  Goals of care-patient is very sick.  Grim prognosis.  She is currently partial code with no CPR or intubation but NIPPV and ACLS meds.  Palliative medicine involved.   Continuing discussions with family regarding goals of care. -follow palliative medicine recommendations   DVT prophylaxis: Lovenox Code Status: Partial Family Communication: Daughter at bedside 8/12 Disposition Plan: Status is: Inpatient  Remains inpatient appropriate because:Inpatient level of care appropriate due to severity of illness   Dispo: The patient is from: Home               Anticipated d/c is to: SNF              Anticipated d/c date is: > 3 days              Patient currently is not medically stable to d/c.  Patient remains very ill.  Markedly encephalopathic.  Severe critical illness myopathy encephalopathy.  No clearly reversible cause.  Suspect this is secondary to intubation and likely ICU stay.  Unfortunately feel that her long-term prognosis is very poor.  Consultants:   None  Procedures:   Endotracheal intubation, now extubated  Antimicrobials:   None   Subjective: Appears weak and deconditioned.  Attempting to speak but looks like she cannot.  She cannot raise her arms.  Objective: Vitals:   08/27/19 2349 08/28/19 0424 08/28/19 0757 08/28/19 1015  BP: 124/77  (!) 146/75 127/83  Pulse: 96  75 (!) 104  Resp: 16  20 20   Temp: (!) 101.4 F (38.6 C)  (!) 102.7 F (39.3 C) 100.2 F (37.9 C)  TempSrc: Oral  Axillary   SpO2: 100%  99% 100%  Weight:  82.1 kg    Height:        Intake/Output Summary (Last 24 hours) at 08/28/2019 1041 Last data filed at 08/28/2019 0450 Gross per 24 hour  Intake --  Output 1250 ml  Net -1250 ml   Filed Weights   08/25/19 0558 08/26/19 0535 08/28/19 0424  Weight: 79.9 kg 81.6 kg 82.1 kg    Examination:  General:Lethargic, encephalopathic, warm to touch HEENT: Normocephalic, atraumatic, poor dentition Neck, supple, trachea midline, no tenderness Heart: Tachycardic rhythm, no murmurs Lungs:Bibasilar crackles. Normal work of breathing. Room air Abdomen: Soft, nontender, nondistended, positive bowel sounds, NG tube in place Extremities:Atraumatic. No clubbing or cyanosis. Muscle wasting noted bilaterally Skin: No rashes or lesions, normal color Neurologic:Unable to assess due to encephalopathy Psychiatric:Unable to assess due to encephalopathy    Data Reviewed: I have personally reviewed following labs and imaging studies  CBC: Recent Labs  Lab 08/24/19 0352 08/25/19 0438  08/26/19 0500 08/27/19 0536 08/28/19 0254  WBC 7.5 7.3 8.9 8.1 15.1*  NEUTROABS 5.5 5.4 6.4 5.6 11.6*  HGB 11.3* 11.3* 12.2 11.5* 12.2  HCT 34.3* 36.1 38.8 36.7 39.1  MCV 91.5 93.8 93.3 95.1 94.2  PLT 148* 128* 151 118* 092   Basic Metabolic Panel: Recent Labs  Lab 08/24/19 0352 08/25/19 0438 08/26/19 0500 08/27/19 0536 08/28/19 0254  NA 143 140 137 139 139  K 4.3 4.1 4.8 4.8 5.0  CL 108 101 98 98 96*  CO2 28 29 31 29  32  GLUCOSE 178* 182* 241* 223* 244*  BUN 32* 25* 34* 42* 38*  CREATININE 0.76 0.46  0.54 0.63 0.86 0.67  CALCIUM 8.4* 8.3* 8.5* 8.4* 8.7*  MG 1.9 1.5* 2.0 2.0 2.1  PHOS 2.2* 2.5 2.7 2.5 2.4*   GFR: Estimated Creatinine Clearance: 53.8 mL/min (by  C-G formula based on SCr of 0.67 mg/dL). Liver Function Tests: Recent Labs  Lab 08/24/19 0352 08/25/19 0438 08/26/19 0500 08/27/19 0536 08/28/19 0254  ALBUMIN 2.1* 2.0* 2.1* 2.0* 2.2*   No results for input(s): LIPASE, AMYLASE in the last 168 hours. No results for input(s): AMMONIA in the last 168 hours. Coagulation Profile: No results for input(s): INR, PROTIME in the last 168 hours. Cardiac Enzymes: No results for input(s): CKTOTAL, CKMB, CKMBINDEX, TROPONINI in the last 168 hours. BNP (last 3 results) No results for input(s): PROBNP in the last 8760 hours. HbA1C: No results for input(s): HGBA1C in the last 72 hours. CBG: Recent Labs  Lab 08/27/19 1634 08/27/19 1959 08/27/19 2347 08/28/19 0409 08/28/19 0759  GLUCAP 182* 180* 234* 237* 231*   Lipid Profile: No results for input(s): CHOL, HDL, LDLCALC, TRIG, CHOLHDL, LDLDIRECT in the last 72 hours. Thyroid Function Tests: No results for input(s): TSH, T4TOTAL, FREET4, T3FREE, THYROIDAB in the last 72 hours. Anemia Panel: No results for input(s): VITAMINB12, FOLATE, FERRITIN, TIBC, IRON, RETICCTPCT in the last 72 hours. Sepsis Labs: Recent Labs  Lab 08/28/19 0254  PROCALCITON 0.53    No results found for this or any previous visit (from  the past 240 hour(s)).       Radiology Studies: DG Chest Port 1 View  Result Date: 08/28/2019 CLINICAL DATA:  Encephalopathy. EXAM: PORTABLE CHEST 1 VIEW COMPARISON:  Chest x-ray dated August 10, 2019. FINDINGS: Interval extubation. Unchanged enteric tube entering the stomach with the tip below the field of view. Stable cardiomegaly. Atherosclerotic calcification of the aortic arch. Normal pulmonary vascularity. No focal consolidation, pleural effusion, or pneumothorax. No acute osseous abnormality. IMPRESSION: 1. No active disease. Electronically Signed   By: Titus Dubin M.D.   On: 08/28/2019 08:10        Scheduled Meds: . atorvastatin  10 mg Per Tube Daily  . chlorhexidine gluconate (MEDLINE KIT)  15 mL Mouth Rinse BID  . Chlorhexidine Gluconate Cloth  6 each Topical Daily  . diltiazem  30 mg Per Tube Q6H  . donepezil  10 mg Per Tube QHS  . enoxaparin (LOVENOX) injection  40 mg Subcutaneous Q24H  . feeding supplement (PROSource TF)  45 mL Per Tube Daily  . free water  60 mL Per Tube Q4H  . insulin aspart  0-20 Units Subcutaneous Q4H  . memantine  5 mg Per Tube BID  . propranolol  20 mg Per Tube TID  . propylthiouracil  50 mg Per NG tube Q8H  . sertraline  25 mg Per Tube Daily  . sodium chloride flush  3 mL Intravenous Q12H  . torsemide  20 mg Per Tube BID   Continuous Infusions: . sodium chloride 250 mL (08/10/19 1547)  . sodium chloride Stopped (08/23/19 1120)  . acetaminophen 1,000 mg (08/28/19 0843)  . ampicillin-sulbactam (UNASYN) IV 3 g (08/28/19 9741)  . feeding supplement (OSMOLITE 1.5 CAL) 1,000 mL (08/28/19 6384)     LOS: 31 days    Time spent: 25 minutes    Sidney Ace, MD [facility] Pager [phone]  If 7PM-7AM, please contact night-coverage 08/28/2019, 10:41 AM

## 2019-08-28 NOTE — Plan of Care (Signed)
PMT note:  Attempted to call Melanie Lewis, who is only contact listed. No answer and no VM picked up.

## 2019-08-28 NOTE — Progress Notes (Signed)
   08/28/19 0757  Vitals  Temp (!) 102.7 F (39.3 C)  Temp Source Axillary  BP (!) 146/75  MAP (mmHg) 95  BP Location Right Arm  BP Method Automatic  Patient Position (if appropriate) Lying  Pulse Rate 75  Pulse Rate Source Monitor  Resp 20  MEWS COLOR  MEWS Score Color Yellow  Oxygen Therapy  SpO2 99 %  O2 Device Nasal Cannula  O2 Flow Rate (L/min) 2 L/min  Pain Assessment  Pain Scale Faces  Pain Score Asleep  MEWS Score  MEWS Temp 2  MEWS Systolic 0  MEWS Pulse 0  MEWS RR 0  MEWS LOC 0  MEWS Score 2  Provider Notification  Provider Name/Title Dr. Georgeann Oppenheim  Date Provider Notified 08/28/19  Time Provider Notified [phone]  Notification Type Page  Notification Reason Other (Comment) (elevating temp/BP)  Response See new orders  Date of Provider Response 08/28/19  Time of Provider Response [phone]

## 2019-08-28 NOTE — Progress Notes (Addendum)
Occupational Therapy Treatment Patient Details Name: Melanie Lewis MRN: [id number] DOB: Mar 28, 1934 Today's Date: 08/28/2019    History of present illness Pt is an 85 y.o. female presenting to hospital 7/16 with lip swelling, tongue swelling, and some neck fullness; pt with SOB and difficulty breathing; intubated in ED emergently.  Pt admitted with severe acute hypoxic and hypercapnic respiratory failure from acute angioedema leading to severe airway obstruction; also AKI/renal failure.  During hospitalization pt found to have a-fib with RVR; pt also noted with metabolic encephalopathy.  PMH includes htn, CAD, CHF, dementia, heart attack, MI, bradycardia, PSVT.   OT comments  Pt seen for OT tx this date to f/u with involved family re: positioning and PROM recommendations. Pt continues to appear primarily obtunded and unable to actively participate with therapy. She did show some increased visual attention  and somewhat improved ability to be active with therapist despite still being unable to move limbs in a previous session. However, this date, pt with less visual attention throughout. OT confirms ed re: positioning and PROM with Pt's daughter and she verbalized understanding. In addition, Ed provided re: continuing to assess for pt's ability to participate and skilled needs versus what aspects could potentially be assumed by pt family or nursing staff in which case skilled therapy would no longer be warranted.  Will re-assess next session. RN and MD notified.   Follow Up Recommendations  SNF;Supervision/Assistance - 24 hour;LTACH    Equipment Recommendations  Other (comment) (defer to next level of care, anticiapte extensive equipment needs potentially, at least hosptial bed.)    Recommendations for Other Services      Precautions / Restrictions Precautions Precautions: Fall Precaution Comments: NPO; NG tube; rectal tube Restrictions Weight Bearing Restrictions: No       Mobility  Bed Mobility               General bed mobility comments: Deferred  Transfers                 General transfer comment: Deferred    Balance                                           ADL either performed or assessed with clinical judgement   ADL                                         General ADL Comments: unable to participate, TOTAL A     Vision   Additional Comments: unable to assess, decreased visual attention this date.   Perception     Praxis      Cognition Arousal/Alertness: Lethargic Behavior During Therapy: WFL for tasks assessed/performed Overall Cognitive Status: Difficult to assess                                 General Comments: Pt unable to verbalize, did have increased visual attention, some lip movement to minimally contribute to communuication in a previous session. This date, pt with no attempts to communicate and decreased visual attention/arousal in general.        Exercises Other Exercises Other Exercises: OT facilitates ed with pt's daughter who is present re: UE positioning-elevation for edema, PROM to maintain joint  integrity, pt's dtr with good understanding. Lastly, ed re: continuing to assess for appropriateness of skilled therapy as pt remains largely obtunded and unable to actively particiapte. Will re-assess next session to determine if skilled needs still present or if all care would be better assumed by nursing staff and family.   Shoulder Instructions       General Comments      Pertinent Vitals/ Pain       Pain Assessment: Faces Faces Pain Scale: No hurt  Home Living                                          Prior Functioning/Environment              Frequency  Min 1X/week        Progress Toward Goals  OT Goals(current goals can now be found in the care plan section)  Progress towards OT goals: OT to reassess next treatment  Acute  Rehab OT Goals Patient Stated Goal: none stated OT Goal Formulation: With family Time For Goal Achievement: 09/03/19 Potential to Achieve Goals: Fair  Plan Discharge plan remains appropriate    Co-evaluation                 AM-PAC OT "6 Clicks" Daily Activity     Outcome Measure   Help from another person eating meals?: Total Help from another person taking care of personal grooming?: Total Help from another person toileting, which includes using toliet, bedpan, or urinal?: Total Help from another person bathing (including washing, rinsing, drying)?: Total Help from another person to put on and taking off regular upper body clothing?: Total Help from another person to put on and taking off regular lower body clothing?: Total 6 Click Score: 6    End of Session    OT Visit Diagnosis: Other abnormalities of gait and mobility (R26.89);Muscle weakness (generalized) (M62.81)   Activity Tolerance     Patient Left in bed;with call bell/phone within reach;with bed alarm set;with family/visitor present   Nurse Communication Other (comment) (communicated to nurse re: potential need for nursing to assume carryover of ROM in conjunction with family for joint and skin integrity.)        Time: 0737-1062 OT Time Calculation (min): 12 min  Charges: OT General Charges $OT Visit: 1 Visit OT Treatments $Self Care/Home Management : 8-22 mins   Rejeana Brock, MS, OTR/L ascom [phone] 08/28/19, 12:17 PM

## 2019-08-28 NOTE — Progress Notes (Deleted)
   08/28/19 0757  Assess: MEWS Score  Temp (!) 102.7 F (39.3 C)  BP (!) 146/75  Pulse Rate 75  Resp 20  SpO2 99 %  O2 Device Nasal Cannula  O2 Flow Rate (L/min) 2 L/min  Assess: MEWS Score  MEWS Temp 2  MEWS Systolic 0  MEWS Pulse 0  MEWS RR 0  MEWS LOC 0  MEWS Score 2  MEWS Score Color Yellow  Assess: if the MEWS score is Yellow or Red  Were vital signs taken at a resting state? Yes  Focused Assessment No change from prior assessment  Early Detection of Sepsis Score *See Row Information* Medium  MEWS guidelines implemented *See Row Information* No, previously yellow, continue vital signs every 4 hours  Treat  MEWS Interventions Administered prn meds/treatments  Pain Scale Faces  Pain Score Asleep  Take Vital Signs  Increase Vital Sign Frequency  Yellow: Q 2hr X 2 then Q 4hr X 2, if remains yellow, continue Q 4hrs  Escalate  MEWS: Escalate Yellow: discuss with charge nurse/RN and consider discussing with provider and RRT  Notify: Charge Nurse/RN  Name of Charge Nurse/RN Notified Victorino Dike  Date Charge Nurse/RN Notified 08/28/19  Time Charge Nurse/RN Notified 0815  Notify: Provider  Provider Name/Title Dr. Georgeann Oppenheim  Date Provider Notified 08/28/19  Time Provider Notified [phone]  Notification Type Page  Notification Reason Other (Comment) (elevating temp/BP)  Response See new orders  Date of Provider Response 08/28/19  Time of Provider Response [phone]

## 2019-08-28 NOTE — Care Management Important Message (Signed)
Important Message  Patient Details  Name: Melanie Lewis MRN: [id number] Date of Birth: [birth year]   Medicare Important Message Given:  Yes     Bernadette Hoit 08/28/2019, 11:23 AM

## 2019-08-28 NOTE — Progress Notes (Addendum)
Daily Progress Note   Patient Name: Melanie Lewis       Date: 08/28/2019 DOB: 04/24/1934  Age: 85 y.o. MRN#: [id number] Attending Physician: Sidney Ace, MD Primary Care Physician: Leonel Ramsay, MD Admit Date: 07/28/2019  Reason for Consultation/Follow-up: Psychosocial/spiritual support  Subjective: Patient is resting in bed with feeding tube to nose. Daughter is at bedside. PT is working with patient. She states she has been advised that the NGT is not conducive to the health of her airway tissue. She is aware her mother is not considered safe to swallow.   She states she does not want a PEG tube for her mother. She does not want to transition to comfort care. She states she wants the NGT removed. She wants her mother to have a pureed diet, and to see how she does. She is aware there is extremely high risk of aspiration and subsequent PNA. Inquired of what would happen if she were to develop PNA. She states she will cross that bridge when the time comes, but she wants the tube out now, and a pureed diet. Inquired about calling to speak with the other 2 siblings as we have no name or number in the system for them. No name or number was provided.  Stepped out to call primary MD. He states he is okay with this and wants to confirm DNR/DNI.  Returned to confirm DNR/DNI, and RN was at bedside during this conversation. Daughter confirms this.    RN has a number for daughter [name] who called recently. Called to speak with [name] who is enroute to [facility]. She voices concern over her mother's status. 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 She states she is hopeful to speak to staff in person. Updated her on current care. She will come tomorrow. Phone handed to primary MD who is present with me.        Length of Stay: 31  Current Medications: Scheduled Meds:   atorvastatin  10 mg Per Tube Daily   chlorhexidine gluconate (MEDLINE KIT)  15 mL Mouth Rinse BID   Chlorhexidine Gluconate Cloth  6 each Topical Daily   diltiazem  30 mg Per Tube Q6H   donepezil  10 mg Per Tube QHS   enoxaparin (LOVENOX) injection  40 mg Subcutaneous Q24H   feeding supplement (PROSource  TF)  45 mL Per Tube Daily   free water  60 mL Per Tube Q4H   insulin aspart  0-20 Units Subcutaneous Q4H   memantine  5 mg Per Tube BID   propranolol  20 mg Per Tube TID   propylthiouracil  50 mg Per NG tube Q8H   sertraline  25 mg Per Tube Daily   sodium chloride flush  3 mL Intravenous Q12H   torsemide  20 mg Per Tube BID    Continuous Infusions:  sodium chloride 250 mL (08/10/19 1547)   sodium chloride Stopped (08/23/19 1120)   acetaminophen 1,000 mg (08/28/19 0843)   ampicillin-sulbactam (UNASYN) IV 3 g (08/28/19 0808)   feeding supplement (OSMOLITE 1.5 CAL) 1,000 mL (08/28/19 0865)    PRN Meds: sodium chloride, acetaminophen, hydrALAZINE, sodium chloride flush  Physical Exam Skin:    General: Skin is warm and dry.  Neurological:     Mental Status: She is alert.             Vital Signs: BP 128/71 (BP Location: Right Arm)    Pulse 88    Temp 98.5 F (36.9 C) (Oral)    Resp 20    Ht 5' 2"  (1.575 m)    Wt 82.1 kg    LMP  (LMP Unknown)    SpO2 100%    BMI 33.10 kg/m  SpO2: SpO2: 100 % O2 Device: O2 Device: Nasal Cannula O2 Flow Rate: O2 Flow Rate (L/min): 2 L/min  Intake/output summary:   Intake/Output Summary (Last 24 hours) at 08/28/2019 1427 Last data filed at 08/28/2019 1424 Gross per 24 hour  Intake --  Output 1975 ml  Net -1975 ml   LBM: Last BM Date: 08/26/19 Baseline Weight: Weight: 93 kg Most recent weight: Weight: 82.1 kg       Palliative Assessment/Data:    Flowsheet Rows     Most Recent Value  Intake Tab  Referral Department Hospitalist  Unit at Time  of Referral ICU  Date Notified 08/13/19  Palliative Care Type New Palliative care  Reason for referral Clarify Goals of Care  Date of Admission 07/28/19  Date first seen by Palliative Care 08/15/19  # of days Palliative referral response time 2 Day(s)  # of days IP prior to Palliative referral 16  Clinical Assessment  Psychosocial & Spiritual Assessment  Palliative Care Outcomes      Patient Active Problem List   Diagnosis Date Noted   Angioedema 07/28/2019   Hyperthyroidism 04/20/2017   Coronary artery disease of native artery of native heart with stable angina pectoris ([place]) 04/20/2017   Pure hypercholesterolemia 04/20/2017   Dementia without behavioral disturbance ([place]) 04/20/2017   Osteopenia of lumbar spine 04/20/2017   History of non-ST elevation myocardial infarction (NSTEMI) 08/14/2014   Chronic diastolic heart failure ([place]) 06/29/2014   Paroxysmal supraventricular tachycardia ([place]) 06/23/2014   Essential hypertension 04/19/2014   Bradycardia 04/19/2014    Palliative Care Assessment & Plan   Recommendations/Plan: DNR/DNI. Daughter would like NGT removed and her mother to have a diet. Patient is at extremely high risk of aspiration and daughter at bedside and [name] is aware.    Code Status:    Code Status Orders  (From admission, onward)         Start     Ordered   08/15/19 1450  Limited resuscitation (code)  Continuous       Question Answer Comment  In the event of cardiac or respiratory ARREST: Initiate Code Blue, Call  Rapid Response Yes   In the event of cardiac or respiratory ARREST: Perform CPR No   In the event of cardiac or respiratory ARREST: Perform Intubation/Mechanical Ventilation No   In the event of cardiac or respiratory ARREST: Use NIPPV/BiPAp only if indicated Yes   In the event of cardiac or respiratory ARREST: Administer ACLS medications if indicated Yes   In the event of cardiac or respiratory ARREST: Perform Defibrillation or  Cardioversion if indicated No   Comments No defibrillation or CPR. No reintubation/trach following this extubation which should occur when medically optimized.      08/15/19 1451        Code Status History    Date Active Date Inactive Code Status Order ID Comments User Context   07/28/2019 1542 08/15/2019 1451 Full Code [id number]  Flora Lipps, MD ED   08/14/2014 1415 08/15/2014 1531 Full Code [id number]  Isaias Cowman, MD Inpatient   08/13/2014 2206 08/14/2014 1415 Full Code [id number]  Lytle Butte, MD ED   06/23/2014 0527 06/24/2014 1617 Full Code [id number]  Juluis Mire, MD Inpatient   Advance Care Planning Activity      Prognosis:  Poor overall    Thank you for allowing the Palliative Medicine Team to assist in the care of this patient.   Total Time 2:05-3:20  1 hour 79mn Prolonged Time Billed  yes      Greater than 50%  of this time was spent counseling and coordinating care related to the above assessment and plan.  CAsencion Gowda NP  Please contact Palliative Medicine Team phone at [phone] questions and concerns.

## 2019-08-28 NOTE — Progress Notes (Signed)
[place] Nurse removed NG-Tube at this time.  Oral suction placed in room.  Will monitor.

## 2019-08-29 ENCOUNTER — Encounter: Payer: Self-pay | Admitting: Radiology

## 2019-08-29 ENCOUNTER — Inpatient Hospital Stay: Payer: Medicare Other

## 2019-08-29 DIAGNOSIS — Z515 Encounter for palliative care: Secondary | ICD-10-CM | POA: Diagnosis not present

## 2019-08-29 DIAGNOSIS — Z7189 Other specified counseling: Secondary | ICD-10-CM | POA: Diagnosis not present

## 2019-08-29 LAB — RENAL FUNCTION PANEL
Albumin: 2.1 g/dL — ABNORMAL LOW (ref 3.5–5.0)
Anion gap: 15 (ref 5–15)
BUN: 47 mg/dL — ABNORMAL HIGH (ref 8–23)
CO2: 32 mmol/L (ref 22–32)
Calcium: 8.4 mg/dL — ABNORMAL LOW (ref 8.9–10.3)
Chloride: 98 mmol/L (ref 98–111)
Creatinine, Ser: 0.76 mg/dL (ref 0.44–1.00)
GFR calc Af Amer: >60 mL/min (ref >60)
GFR calc non Af Amer: >60 mL/min (ref >60)
Glucose, Bld: 97 mg/dL (ref 70–99)
Phosphorus: 4.5 mg/dL (ref 2.5–4.6)
Potassium: 4.4 mmol/L (ref 3.5–5.1)
Sodium: 145 mmol/L (ref 135–145)

## 2019-08-29 LAB — CBC WITH DIFFERENTIAL/PLATELET
Abs Immature Granulocytes: 0.49 10*3/uL — ABNORMAL HIGH (ref 0.00–0.07)
Basophils Absolute: 0.1 10*3/uL (ref 0.0–0.1)
Basophils Relative: 0 %
Eosinophils Absolute: 0 10*3/uL (ref 0.0–0.5)
Eosinophils Relative: 0 %
HCT: 39.8 % (ref 36.0–46.0)
Hemoglobin: 12.7 g/dL (ref 12.0–15.0)
Immature Granulocytes: 2 %
Lymphocytes Relative: 14 %
Lymphs Abs: 3.1 10*3/uL (ref 0.7–4.0)
MCH: 29.3 pg (ref 26.0–34.0)
MCHC: 31.9 g/dL (ref 30.0–36.0)
MCV: 91.9 fL (ref 80.0–100.0)
Monocytes Absolute: 2.4 10*3/uL — ABNORMAL HIGH (ref 0.1–1.0)
Monocytes Relative: 11 %
Neutro Abs: 16.4 10*3/uL — ABNORMAL HIGH (ref 1.7–7.7)
Neutrophils Relative %: 73 %
Platelets: 189 10*3/uL (ref 150–400)
RBC: 4.33 MIL/uL (ref 3.87–5.11)
RDW: 15.2 % (ref 11.5–15.5)
Smear Review: NORMAL
WBC: 22.5 10*3/uL — ABNORMAL HIGH (ref 4.0–10.5)
nRBC: 0 % (ref 0.0–0.2)

## 2019-08-29 LAB — GLUCOSE, CAPILLARY
Glucose-Capillary: 107 mg/dL — ABNORMAL HIGH (ref 70–99)
Glucose-Capillary: 116 mg/dL — ABNORMAL HIGH (ref 70–99)
Glucose-Capillary: 128 mg/dL — ABNORMAL HIGH (ref 70–99)
Glucose-Capillary: 128 mg/dL — ABNORMAL HIGH (ref 70–99)
Glucose-Capillary: 131 mg/dL — ABNORMAL HIGH (ref 70–99)
Glucose-Capillary: 139 mg/dL — ABNORMAL HIGH (ref 70–99)
Glucose-Capillary: 172 mg/dL — ABNORMAL HIGH (ref 70–99)

## 2019-08-29 LAB — MAGNESIUM: Magnesium: 2.3 mg/dL (ref 1.7–2.4)

## 2019-08-29 MED ORDER — SODIUM CHLORIDE 0.9 % IV SOLN
3.0000 g | Freq: Four times a day (QID) | INTRAVENOUS | Status: DC
  Administered 2019-08-29 – 2019-09-02 (×15): 3 g via INTRAVENOUS
  Filled 2019-08-29 (×5): qty 8
  Filled 2019-08-29: qty 3
  Filled 2019-08-29: qty 8
  Filled 2019-08-29: qty 3
  Filled 2019-08-29 (×3): qty 8
  Filled 2019-08-29: qty 0.86
  Filled 2019-08-29 (×7): qty 8

## 2019-08-29 MED ORDER — VANCOMYCIN HCL 750 MG/150ML IV SOLN: 750.0000 mg | INTRAVENOUS | Status: DC

## 2019-08-29 MED ORDER — DEXTROSE-NACL 5-0.45 % IV SOLN: INTRAVENOUS | Status: DC

## 2019-08-29 MED ORDER — SODIUM CHLORIDE 0.9 % IV SOLN
2.0000 g | Freq: Two times a day (BID) | INTRAVENOUS | Status: DC
  Administered 2019-08-29: 2 g via INTRAVENOUS
  Filled 2019-08-29 (×2): qty 2

## 2019-08-29 MED ORDER — IOHEXOL 300 MG/ML  SOLN
75.0000 mL | Freq: Once | INTRAMUSCULAR | Status: AC | PRN
  Administered 2019-08-29: 75 mL via INTRAVENOUS

## 2019-08-29 MED ORDER — VANCOMYCIN HCL 1750 MG/350ML IV SOLN
1750.0000 mg | Freq: Once | INTRAVENOUS | Status: AC
  Administered 2019-08-29: 1750 mg via INTRAVENOUS
  Filled 2019-08-29: qty 350

## 2019-08-29 NOTE — Progress Notes (Signed)
PROGRESS NOTE    Melanie Lewis  YQI:[id number] DOB: Jan 31, 1934 DOA: 07/28/2019 PCP: Leonel Ramsay, MD  Brief Narrative: 85 year old female with history of dementia, paroxysmal SVT, diastolic CHF, CAD, HTN and hypothyroidism admitted to ICU on 07/28/2019 with acute respiratory failure in the setting of severe angioedema from ACE inhibitor's requiring intubation with mechanical ventilation until 08/19/2019.   Hospital course complicated by intermittent A. fib with RVR and acute metabolic encephalopathy.  Cardiology and neurology consulted.  MRI brain negative for acute intracranial finding.  Neurology query "apathic hyperthyroidism".  She was a started on Provigil with minimal improvement in her mental status.  Palliative medicine involved.  Patient was transferred to [facility] on 08/21/2019.  Remains encephalopathic.  On tube feed via an NG tube.   8/11: Patient seen and examined.  Appears to be trying to speak but 2-week.  NG tube remains in place.  Tube feeds running at 50 cc/h.  Patient does have some comprehension.  Nods her head yes and no appropriately to interview questions.  No visualized distress  8/12: Patient seen and examined.  Remains verbally unresponsive.  Is able to nod head yes or no to simple questions.  Daughter at bedside.  Lengthy conversation about patient's current status and the uncertainty regarding her recovery potential.  Daughter undecided on PEG tube.  8/13: Patient seen and examined.  Verbally unresponsive.  Does open eyes on command.  Unable to lift arms or speak.  Does nod head yes or no when asked simple questions.  No family members at bedside this morning.  The daughter and I spoke about PEG tube placement yesterday.  She is still undecided.  8/14: Patient seen and examined.  Remains verbally unresponsive.  Does open eyes on command.  I had a long conversation with speech therapist today.  She reach out to the patient's wife addressing the  concerns.  I am recommending strict n.p.o. given patient's nearly complete lack of ability to open her mouth and protect her airway when eating.  Metabolic panel and hemogram largely within normal limits.  No acute decompensating events overnight.  Patient remains very grim prognosis and high risk for further decompensation.  8/15: Patient seen and examined.  Remains verbally unresponsive.  On prompting she seemed like she was trying to speak today.  Does open eyes on command.  Nods head yes or no.  Still completely encephalopathic though.  Metabolic profile and hemodynamics within normal limits.  No decompensating events overnight.  Remains on tube feeds at 60 cc/h.  Patient remains high risk for further decompensation.  Overall prognosis poor.  8/16: Patient seen and examined.  Does open eyes and nods head briefly as you know.  Remains verbally unresponsive.  Unable to raise her arms or follow any other commands.  Overnight and this morning patient had acute onset of fever.  Worsening white blood cell count.  Given Tylenol with defervescence.  Started on Unasyn for presumed aspiration.  Received notification from bedside RN that patient's daughter was requesting conference call.  I am available via phone to provide my clinical impression and opinion.  Unfortunately long-term prognosis remains very poor.  8/17: Patient seen and examined.  No clinical changes.  I removed NG tube yesterday and attempted pured diet per the daughter's request.  This was total failure.  Upon attempts to get the patient to swallow pured food she was unable to do so.  Nurse had to suction applesauce out of her mouth.  She is unable  to take any oral medications.  Medications changed IV.  Patient had some painful right-sided facial swelling overnight.  CT maxillofacial with IV contrast was ordered.  Demonstrates right-sided parotitis and submandibular gland swelling.  No abscess or distinct fluid collection.  No stone  identified.    Assessment & Plan:   Active Problems:   Angioedema  New onset fever Unclear source Procalcitonin minimally elevated Suspect aspiration pneumonia given continuous tube feeds Parotitis also on differential now given the CT findings No clear infiltrate on chest x-ray however may be early in the disease course Plan: Continue Unasyn 3 g every 6 hours Blood cultures x2, no growth to date Urine cultures, no growth to date Monitor fever curve Ofirmev every 6 hours as needed fever  Acute respiratory failure with hypoxia in the setting of severe angioedema, resolved Presumed due to ACE inhibitor -ETT 7/16-8/7.   -Weaned off nasal cannula  Completed steroid taper -Discontinued home lisinopril and added to allergy list  Acute metabolic encephalopathy in patient with history of dementia remains encephalopathic.   Appears to be attempting to respond verbally Does open eyes and answers appropriately by nodding her head  MRI brain without acute finding.  Neurology signed off. Repeat CT head on 08/24/2019, no acute findings Plan:  -Lengthy conversation with the daughter at bedside 08/24/2019.  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  Her ability to recover is greatly in question at this time.  Prognosis is poor.  Daughter seems aware of current state however states that patient was previously very high functioning.  Undecided on PEG tube at this time however if we are to continue with aggressive care would likely need a PEG sometime in the next few days.  -Plan of care discussed with palliative care NP Crystal and SLP.  Patient failed speech evaluation bedside and recommending strict n.p.o. status for now.  Speech therapy spoke to the patient's wife per MD request.  Daughter would like [place] to give the patient more time post ICU stay however she understands the skilled speech  therapy services are appropriate only when patient is able to consistently perceive ice chips at her lips.  At this time she is unable to do so.  -Unfortunately we are rapidly approaching a point where our interventions are likely causing more harm than good.  The patient has had a NG tube in place since 08/21/2019.  After discussions with the family and palliative care we attempted to remove the NG tube and start tube feeds on 08/28/2019.  This was unsuccessful.  The patient had no ability to swallow.  Nurse had to suction applesauce out of her mouth  - 8/17: Another daughter at bedside today, tissue.  We had a lengthy conversation about the patient's current state and her hospital course.  I relayed that at this point her chance for meaningful recovery was very very small.  Apparently the patient had already made her wishes known not to have a feeding tube.  So at this time this is not the route we are taking.  We do not have a clear direction as of yet.  We will need repeat discussions with the family and confirmation that the patient has no recovery potential.  Prognosis remains very grim.  Continue to recommend hospice/palliative care.  Dysphagia NG tube has been discontinued 08/28/2019.  We are currently in talks with the family.  We do not feel a feeding tube would  be appropriate course of action.  This also apparently not within the patient's goals of care.  Family has been unable or unwilling to make a decision regarding PEG tube versus comfort measures.  At this point strict NPO.    A. fib with RVR/history of paroxysmal SVT on amiodarone, sotalol and Coreg which have been on hold since admission.  RVR resolved.  TSH 0.014.  Free T4 1.28. -Continue propranolol in the setting of hyperthyroidism -Added Cardizem 30 mg every 6 hours -No amiodarone, sotalol and anticoagulation per cardiology  Hyperthyroidism: TSH 0.014.  Free T4 1.28. -Continue p.o. propranolol -Avoid amiodarone.    Chronic  diastolic CHF:  Echo in 5726 with EF of 65 to 70%, G1 DD and PA PP of 41.  Appears euvolemic except some trace edema.   she is on Demadex 20 mg twice daily at home.   Adequate urine output on Demadex.  Renal function stable. Plan: Converted to IV Lasix given lack of oral access  Uncontrolled hypertension:  Now normotensive. Propranolol held.  Cardizem switched to IV.  Torsemide switched to furosemide IV  Acute kidney injury, resolved  thought to be due to ATN.   Renal ultrasound reassuring.   Resolved. -Monitor intermittently  CAD-seems stable.   Goals of care-patient is very sick.  Grim prognosis.  She is currently partial code with no CPR or intubation but NIPPV and ACLS meds.  Palliative medicine involved.   Continuing discussions with family regarding goals of care. -follow palliative medicine recommendations   DVT prophylaxis: Lovenox Code Status: Partial (ACS meds and NIPPV ok, but NO CPR OR INTUBATION) Family Communication: Daughter at bedside 8/17 Disposition Plan: Status is: Inpatient  Remains inpatient appropriate because:Inpatient level of care appropriate due to severity of illness   Dispo: The patient is from: Home              Anticipated d/c is to: SNF              Anticipated d/c date is: > 3 days              Patient currently is not medically stable to d/c.  Patient remains very ill.  Markedly encephalopathic.  Severe critical illness myopathy and encephalopathy.  No clearly reversible cause.  Patient has shown minimal signs of recovery in the past 7 days.  Unfortunately for long-term prognosis is very poor.  Consultants:   None  Procedures:   Endotracheal intubation, now extubated  Antimicrobials:   None   Subjective: Appears weak and deconditioned.  Attempting to speak but looks like she cannot.  She cannot raise her arms.  Objective: Vitals:   08/28/19 2005 08/28/19 2356 08/29/19 0443 08/29/19 0736  BP: (!) 132/98 (!) 146/93 110/75  116/61  Pulse: 92 71 (!) 55 62  Resp: (!) 24 (!) 22 (!) 22 17  Temp: 97.8 F (36.6 C) 99 F (37.2 C) 98.7 F (37.1 C) 98.2 F (36.8 C)  TempSrc: Oral Oral Oral Oral  SpO2: 100% 100% 100% 100%  Weight:      Height:        Intake/Output Summary (Last 24 hours) at 08/29/2019 1126 Last data filed at 08/29/2019 0449 Gross per 24 hour  Intake 4520 ml  Output 1400 ml  Net 3120 ml   Filed Weights   08/25/19 0558 08/26/19 0535 08/28/19 0424  Weight: 79.9 kg 81.6 kg 82.1 kg    Examination:  General:Lethargic, encephalopathic, warm to touch HEENT: Normocephalic, atraumatic, poor dentition Neck, supple, trachea  midline, no tenderness Heart: Tachycardic rhythm, no murmurs Lungs:Bibasilar crackles. Normal work of breathing. Room air Abdomen: Soft, nontender, nondistended, positive bowel sounds, NG tube in place Extremities:Atraumatic. No clubbing or cyanosis. Muscle wasting noted bilaterally Skin: No rashes or lesions, normal color Neurologic:Unable to assess due to encephalopathy Psychiatric:Unable to assess due to encephalopathy    Data Reviewed: I have personally reviewed following labs and imaging studies  CBC: Recent Labs  Lab 08/25/19 0438 08/26/19 0500 08/27/19 0536 08/28/19 0254 08/29/19 0519  WBC 7.3 8.9 8.1 15.1* 22.5*  NEUTROABS 5.4 6.4 5.6 11.6* 16.4*  HGB 11.3* 12.2 11.5* 12.2 12.7  HCT 36.1 38.8 36.7 39.1 39.8  MCV 93.8 93.3 95.1 94.2 91.9  PLT 128* 151 118* 183 491   Basic Metabolic Panel: Recent Labs  Lab 08/25/19 0438 08/26/19 0500 08/27/19 0536 08/28/19 0254 08/29/19 0519  NA 140 137 139 139 145  K 4.1 4.8 4.8 5.0 4.4  CL 101 98 98 96* 98  CO2 _0 32 32  GLUCOSE 182* 241* 223* 244* 97  BUN 25* 34* 42* 38* 47*  CREATININE 0.46  0.54 0.63 0.86 0.67 0.76  CALCIUM 8.3* 8.5* 8.4* 8.7* 8.4*  MG 1.5* 2.0 2.0 2.1 2.3  PHOS 2.5 2.7 2.5 2.4* 4.5   GFR: Estimated Creatinine Clearance: 53.8 mL/min (by C-G formula based on SCr of 0.76  mg/dL). Liver Function Tests: Recent Labs  Lab 08/25/19 0438 08/26/19 0500 08/27/19 0536 08/28/19 0254 08/29/19 0519  ALBUMIN 2.0* 2.1* 2.0* 2.2* 2.1*   No results for input(s): LIPASE, AMYLASE in the last 168 hours. No results for input(s): AMMONIA in the last 168 hours. Coagulation Profile: No results for input(s): INR, PROTIME in the last 168 hours. Cardiac Enzymes: No results for input(s): CKTOTAL, CKMB, CKMBINDEX, TROPONINI in the last 168 hours. BNP (last 3 results) No results for input(s): PROBNP in the last 8760 hours. HbA1C: No results for input(s): HGBA1C in the last 72 hours. CBG: Recent Labs  Lab 08/28/19 1640 08/28/19 2006 08/28/19 2359 08/29/19 0445 08/29/19 0737  GLUCAP 155* 147* 139* 116* 107*   Lipid Profile: No results for input(s): CHOL, HDL, LDLCALC, TRIG, CHOLHDL, LDLDIRECT in the last 72 hours. Thyroid Function Tests: No results for input(s): TSH, T4TOTAL, FREET4, T3FREE, THYROIDAB in the last 72 hours. Anemia Panel: No results for input(s): VITAMINB12, FOLATE, FERRITIN, TIBC, IRON, RETICCTPCT in the last 72 hours. Sepsis Labs: Recent Labs  Lab 08/28/19 0254  PROCALCITON 0.53    Recent Results (from the past 240 hour(s))  CULTURE, BLOOD (ROUTINE X 2) w Reflex to ID Panel     Status: None (Preliminary result)   Collection Time: 08/28/19  9:29 AM   Specimen: BLOOD  Result Value Ref Range Status   Specimen Description BLOOD BLOOD RIGHT HAND  Final   Special Requests   Final    BOTTLES DRAWN AEROBIC ONLY Blood Culture adequate volume   Culture   Final    NO GROWTH < 24 HOURS Performed at [facility], [address], [place], [place] [address]    Report Status PENDING  Incomplete  CULTURE, BLOOD (ROUTINE X 2) w Reflex to ID Panel     Status: None (Preliminary result)   Collection Time: 08/28/19 10:35 AM   Specimen: BLOOD  Result Value Ref Range Status   Specimen Description BLOOD RIGHT HAND  Final   Special Requests   Final     BOTTLES DRAWN AEROBIC AND ANAEROBIC Blood Culture adequate volume   Culture  Final    NO GROWTH < 24 HOURS Performed at [facility], [address], [place], [place] [address]    Report Status PENDING  Incomplete         Radiology Studies: CT MAXILLOFACIAL W CONTRAST  Result Date: 08/29/2019 CLINICAL DATA:  Right facial swelling. EXAM: CT MAXILLOFACIAL WITH CONTRAST TECHNIQUE: Multidetector CT imaging of the maxillofacial structures was performed with intravenous contrast. Multiplanar CT image reconstructions were also generated. CONTRAST:  26m OMNIPAQUE IOHEXOL 300 MG/ML  SOLN COMPARISON:  None. FINDINGS: Osseous: Negative for fracture. No skeletal lesion. No dental abscess identified. Orbits: Normal orbit bilaterally. Globe is normal. No orbital mass or edema Sinuses: Extensive mucosal edema left maxillary sinus and right sphenoid sinus. Remaining sinuses clear. Soft tissues: Soft tissue swelling in the right neck along the platysmas muscle. This extends to the right parotid gland where there is asymmetric enlargement and heterogeneous enhancement. No abscess mass or stone. There is also edema extending around the right submandibular gland which shows mild hyperenhancement. Findings compatible with right-sided parotitis and submandibular gland inflammation. Left parotid normal.  Left neck negative. Limited intracranial: No acute intracranial abnormality. IMPRESSION: Enlargement of the right parotid gland with soft tissue swelling throughout the right neck. There is also hyperenhancement and edema around the right submandibular gland. Findings compatible with right-sided parotitis and submandibular gland inflammation. No soft tissue abscess. Near complete opacification of the left maxillary sinus and right sphenoid sinus. Electronically Signed   By: CFranchot GalloM.D.   On: 08/29/2019 10:36   DG Chest Port 1 View  Result Date: 08/28/2019 CLINICAL DATA:  Encephalopathy. EXAM:  PORTABLE CHEST 1 VIEW COMPARISON:  Chest x-ray dated August 10, 2019. FINDINGS: Interval extubation. Unchanged enteric tube entering the stomach with the tip below the field of view. Stable cardiomegaly. Atherosclerotic calcification of the aortic arch. Normal pulmonary vascularity. No focal consolidation, pleural effusion, or pneumothorax. No acute osseous abnormality. IMPRESSION: 1. No active disease. Electronically Signed   By: WTitus DubinM.D.   On: 08/28/2019 08:10        Scheduled Meds: . chlorhexidine gluconate (MEDLINE KIT)  15 mL Mouth Rinse BID  . Chlorhexidine Gluconate Cloth  6 each Topical Daily  . diltiazem  3 mg Intravenous Q6H  . enoxaparin (LOVENOX) injection  40 mg Subcutaneous Q24H  . free water  60 mL Per Tube Q4H  . furosemide  20 mg Intravenous Q12H  . insulin aspart  0-20 Units Subcutaneous Q4H  . propranolol  20 mg Per Tube TID  . sodium chloride flush  3 mL Intravenous Q12H   Continuous Infusions: . sodium chloride 250 mL (08/10/19 1547)  . ceFEPime (MAXIPIME) IV 2 g (08/29/19 0851)  . dextrose 5 % and 0.45% NaCl 75 mL/hr at 08/29/19 0848  . [START ON 08/30/2019] vancomycin       LOS: 32 days    Time spent: 25 minutes    SSidney Ace MD [facility] Pager [phone]  If 7PM-7AM, please contact night-coverage 08/29/2019, 11:26 AM

## 2019-08-29 NOTE — Progress Notes (Addendum)
Daily Progress Note   Patient Name: Melanie Lewis       Date: 08/29/2019 DOB: [birth year]  Age: 85 y.o. MRN#: [id number] Attending Physician: Sidney Ace, MD Primary Care Physician: Leonel Ramsay, MD Admit Date: 07/28/2019  Reason for Consultation/Follow-up: Psychosocial/spiritual support  Subjective: Patient is resting in bed, no feeding tube in place. Daughter Melanie Lewis is at bedside. She states she has spoke with her sisters. She is trying to take in what all has transpired, and understand why her mother had to have the level of sedation she required while on the ventilator.    Per notes, attempts to feed patient have been made, and patient required suction to remove the contents. Melanie Lewis states she wants to speak with the attending MD about next steps. Discussed that her mother will need nourishment to sustain. We discussed feeding tube access as well as transition to comfort care. We discussed Ms. Hackley's desire not to have a feeding tube. Melanie Lewis states she understands her mother cannot stay in the hospital indefinitely. Discussed that the three children will talk themselves and decide on plans moving forward. She has no questions for palliative medicine at this time.   ADDENDUM: Per staff and attending, family would like feeding tube access and to proceed with treating the tratable. Recommend palliative to follow outpatient. Palliative will sign off at this time.       Length of Stay: 32  Current Medications: Scheduled Meds:   chlorhexidine gluconate (MEDLINE KIT)  15 mL Mouth Rinse BID   Chlorhexidine Gluconate Cloth  6 each Topical Daily   diltiazem  3 mg Intravenous Q6H   enoxaparin (LOVENOX) injection  40 mg Subcutaneous Q24H   free water  60 mL Per Tube Q4H    furosemide  20 mg Intravenous Q12H   insulin aspart  0-20 Units Subcutaneous Q4H   propranolol  20 mg Per Tube TID   sodium chloride flush  3 mL Intravenous Q12H    Continuous Infusions:  sodium chloride 250 mL (08/10/19 1547)   ampicillin-sulbactam (UNASYN) IV     dextrose 5 % and 0.45% NaCl 75 mL/hr at 08/29/19 0848    PRN Meds: sodium chloride, hydrALAZINE, sodium chloride flush  Physical Exam Skin:    General: Skin is warm and dry.  Neurological:     Mental  Status: She is alert.             Vital Signs: BP 116/61 (BP Location: Right Arm)    Pulse 62    Temp 98.2 F (36.8 C) (Oral)    Resp 17    Ht 5' 2"  (1.575 m)    Wt 82.1 kg    LMP  (LMP Unknown)    SpO2 100%    BMI 33.10 kg/m  SpO2: SpO2: 100 % O2 Device: O2 Device: Nasal Cannula O2 Flow Rate: O2 Flow Rate (L/min): 2 L/min  Intake/output summary:   Intake/Output Summary (Last 24 hours) at 08/29/2019 1238 Last data filed at 08/29/2019 0449 Gross per 24 hour  Intake 4520 ml  Output 1400 ml  Net 3120 ml   LBM: Last BM Date: 08/26/19 Baseline Weight: Weight: 93 kg Most recent weight: Weight: 82.1 kg       Palliative Assessment/Data:    Flowsheet Rows     Most Recent Value  Intake Tab  Referral Department Hospitalist  Unit at Time of Referral ICU  Date Notified 08/13/19  Palliative Care Type New Palliative care  Reason for referral Clarify Goals of Care  Date of Admission 07/28/19  Date first seen by Palliative Care 08/15/19  # of days Palliative referral response time 2 Day(s)  # of days IP prior to Palliative referral 16  Clinical Assessment  Psychosocial & Spiritual Assessment  Palliative Care Outcomes      Patient Active Problem List   Diagnosis Date Noted   Angioedema 07/28/2019   Hyperthyroidism 04/20/2017   Coronary artery disease of native artery of native heart with stable angina pectoris ([place]) 04/20/2017   Pure hypercholesterolemia 04/20/2017   Dementia without behavioral  disturbance ([place]) 04/20/2017   Osteopenia of lumbar spine 04/20/2017   History of non-ST elevation myocardial infarction (NSTEMI) 08/14/2014   Chronic diastolic heart failure ([place]) 06/29/2014   Paroxysmal supraventricular tachycardia ([place]) 06/23/2014   Essential hypertension 04/19/2014   Bradycardia 04/19/2014    Palliative Care Assessment & Plan   Recommendations/Plan: DNR/DNI. Daughter Melanie Lewis is at bedside. Family will need to talk to determine a plan.   Addendum: Family has decided on feeding tube access. Recommend palliative outpatient. We will sign off at this time.     Code Status:    Code Status Orders  (From admission, onward)         Start     Ordered   08/15/19 1450  Limited resuscitation (code)  Continuous       Question Answer Comment  In the event of cardiac or respiratory ARREST: Initiate Code Blue, Call Rapid Response Yes   In the event of cardiac or respiratory ARREST: Perform CPR No   In the event of cardiac or respiratory ARREST: Perform Intubation/Mechanical Ventilation No   In the event of cardiac or respiratory ARREST: Use NIPPV/BiPAp only if indicated Yes   In the event of cardiac or respiratory ARREST: Administer ACLS medications if indicated Yes   In the event of cardiac or respiratory ARREST: Perform Defibrillation or Cardioversion if indicated No   Comments No defibrillation or CPR. No reintubation/trach following this extubation which should occur when medically optimized.      08/15/19 1451        Code Status History    Date Active Date Inactive Code Status Order ID Comments User Context   07/28/2019 1542 08/15/2019 1451 Full Code [id number]  Flora Lipps, MD ED   08/14/2014 1415 08/15/2014 1531 Full Code [id number]  Isaias Cowman, MD Inpatient   08/13/2014 2206 08/14/2014 1415 Full Code [id number]  Lytle Butte, MD ED   06/23/2014 0527 06/24/2014 1617 Full Code [id number]  Juluis Mire, MD Inpatient   Advance Care Planning Activity       Prognosis:  Poor overall    Thank you for allowing the Palliative Medicine Team to assist in the care of this patient.   Total Time 25 min Prolonged Time Billed  no      Greater than 50%  of this time was spent counseling and coordinating care related to the above assessment and plan.  Asencion Gowda, NP  Please contact Palliative Medicine Team phone at [phone] for questions and concerns.

## 2019-08-29 NOTE — Care Management (Addendum)
Both myself and palliative care np Crystal have spoken at length with the patient's daughters.  Today daughter Morrison Old arrived from [place].  Lengthy discussion regarding patient's clinical course and overall grim outlook.  Patient daughter expressed understanding.  Few hours later after left the room received notification of bedside RN that family would like to proceed with some form of enteral nutrition.  This is despite explanation that likely would not benefit the patient and would not improve her quality of life.  Despite this education this is what the family is desiring.  Will proceed with PEG tube evaluation with interventional radiology.  IR consult placed.  Lolita Patella MD

## 2019-08-30 ENCOUNTER — Encounter: Payer: Self-pay | Admitting: Internal Medicine

## 2019-08-30 DIAGNOSIS — I4891 Unspecified atrial fibrillation: Secondary | ICD-10-CM

## 2019-08-30 DIAGNOSIS — R1310 Dysphagia, unspecified: Secondary | ICD-10-CM

## 2019-08-30 DIAGNOSIS — I5032 Chronic diastolic (congestive) heart failure: Secondary | ICD-10-CM | POA: Diagnosis not present

## 2019-08-30 DIAGNOSIS — R1312 Dysphagia, oropharyngeal phase: Secondary | ICD-10-CM

## 2019-08-30 DIAGNOSIS — K1121 Acute sialoadenitis: Secondary | ICD-10-CM | POA: Diagnosis not present

## 2019-08-30 DIAGNOSIS — F0390 Unspecified dementia without behavioral disturbance: Secondary | ICD-10-CM

## 2019-08-30 DIAGNOSIS — T783XXD Angioneurotic edema, subsequent encounter: Secondary | ICD-10-CM | POA: Diagnosis not present

## 2019-08-30 LAB — GLUCOSE, CAPILLARY
Glucose-Capillary: 109 mg/dL — ABNORMAL HIGH (ref 70–99)
Glucose-Capillary: 115 mg/dL — ABNORMAL HIGH (ref 70–99)
Glucose-Capillary: 115 mg/dL — ABNORMAL HIGH (ref 70–99)
Glucose-Capillary: 120 mg/dL — ABNORMAL HIGH (ref 70–99)
Glucose-Capillary: 131 mg/dL — ABNORMAL HIGH (ref 70–99)
Glucose-Capillary: 99 mg/dL (ref 70–99)

## 2019-08-30 LAB — CBC WITH DIFFERENTIAL/PLATELET
Abs Immature Granulocytes: 0.27 10*3/uL — ABNORMAL HIGH (ref 0.00–0.07)
Basophils Absolute: 0.1 10*3/uL (ref 0.0–0.1)
Basophils Relative: 0 %
Eosinophils Absolute: 0.1 10*3/uL (ref 0.0–0.5)
Eosinophils Relative: 1 %
HCT: 36.7 % (ref 36.0–46.0)
Hemoglobin: 11.4 g/dL — ABNORMAL LOW (ref 12.0–15.0)
Immature Granulocytes: 2 %
Lymphocytes Relative: 13 %
Lymphs Abs: 2.1 10*3/uL (ref 0.7–4.0)
MCH: 29.5 pg (ref 26.0–34.0)
MCHC: 31.1 g/dL (ref 30.0–36.0)
MCV: 95.1 fL (ref 80.0–100.0)
Monocytes Absolute: 1.6 10*3/uL — ABNORMAL HIGH (ref 0.1–1.0)
Monocytes Relative: 9 %
Neutro Abs: 12.9 10*3/uL — ABNORMAL HIGH (ref 1.7–7.7)
Neutrophils Relative %: 75 %
Platelets: 182 10*3/uL (ref 150–400)
RBC: 3.86 MIL/uL — ABNORMAL LOW (ref 3.87–5.11)
RDW: 15.4 % (ref 11.5–15.5)
WBC: 17 10*3/uL — ABNORMAL HIGH (ref 4.0–10.5)
nRBC: 0 % (ref 0.0–0.2)

## 2019-08-30 LAB — RENAL FUNCTION PANEL
Albumin: 1.9 g/dL — ABNORMAL LOW (ref 3.5–5.0)
Anion gap: 12 (ref 5–15)
BUN: 56 mg/dL — ABNORMAL HIGH (ref 8–23)
CO2: 30 mmol/L (ref 22–32)
Calcium: 8.1 mg/dL — ABNORMAL LOW (ref 8.9–10.3)
Chloride: 104 mmol/L (ref 98–111)
Creatinine, Ser: 0.83 mg/dL (ref 0.44–1.00)
GFR calc Af Amer: >60 mL/min (ref >60)
GFR calc non Af Amer: >60 mL/min (ref >60)
Glucose, Bld: 124 mg/dL — ABNORMAL HIGH (ref 70–99)
Phosphorus: 4.5 mg/dL (ref 2.5–4.6)
Potassium: 3.3 mmol/L — ABNORMAL LOW (ref 3.5–5.1)
Sodium: 146 mmol/L — ABNORMAL HIGH (ref 135–145)

## 2019-08-30 LAB — MAGNESIUM: Magnesium: 2.4 mg/dL (ref 1.7–2.4)

## 2019-08-30 MED ORDER — POTASSIUM CHLORIDE 10 MEQ/100ML IV SOLN
10.0000 meq | INTRAVENOUS | Status: AC
  Administered 2019-08-30 (×4): 10 meq via INTRAVENOUS
  Filled 2019-08-30: qty 100

## 2019-08-30 MED ORDER — KCL IN DEXTROSE-NACL 40-5-0.45 MEQ/L-%-% IV SOLN
INTRAVENOUS | Status: DC
  Filled 2019-08-30 (×3): qty 1000

## 2019-08-30 NOTE — Progress Notes (Signed)
PT Cancellation Note  Patient Details Name: Melanie Lewis MRN: [id number] DOB: 1934-04-14   Cancelled Treatment:    Reason Eval/Treat Not Completed: Patient not medically ready.  Pt's HR noted to be in a-fib rhythm (ranging 113-131 bpm) with pt resting in bed (nurse notified).  D/t elevated HR at rest, will hold PT at this time and re-attempt PT treatment session at a later date/time as medically appropriate.  Hendricks Limes, PT 08/30/19, 2:24 PM

## 2019-08-30 NOTE — Progress Notes (Signed)
Occupational Therapy Treatment Patient Details Name: PAISLEIGH MARONEY MRN: [id number] DOB: [birth year] Today's Date: 08/30/2019    History of present illness Pt is an 85 y.o. female presenting to hospital 7/16 with lip swelling, tongue swelling, and some neck fullness; pt with SOB and difficulty breathing; intubated in ED emergently.  Pt admitted with severe acute hypoxic and hypercapnic respiratory failure from acute angioedema leading to severe airway obstruction; also AKI/renal failure.  During hospitalization pt found to have a-fib with RVR; pt also noted with metabolic encephalopathy.  PMH includes htn, CAD, CHF, dementia, heart attack, MI, bradycardia, PSVT.   OT comments  Pt supine in bed and sleeping soundly. No family present this session. Pt placed in chair position in bed and cold cloth placed on face in an attempt to increase alertness. Pt does not open eyes during this time. OT utilized suction toothbrush for oral care with pt opening eyes briefly and then demonstrating bite reflex once suction enters pt's mouth. She is unable to follow any commands this session and remains nonverbal. OT providing total A for positioning in bed. PROM to B UEs in all planes of movement with pt not interacting with therapist at all. To date pt has made no progress towards occupational therapy goals and appears to have had functional decline. OT SIGNING OFF at this time secondary to pt being unable to participate in therapeutic intervention. Family has been taught PROM techniques for B UEs. Please reconsult if appropriate.    Follow Up Recommendations  SNF;Supervision/Assistance - 24 hour;LTACH    Equipment Recommendations  Other (comment) (defer to next venue of care - if going home will at least need hospital bed)       Precautions / Restrictions Precautions Precautions: Fall Precaution Comments: NPO; rectal tube Restrictions Other Position/Activity Restrictions: monitor HR, fluctuating at rest  this date up to 120       Mobility Bed Mobility Overal bed mobility: Needs Assistance      General bed mobility comments: total assistance for bed mobility for repositioning. no active participation provided by patient   Transfers    General transfer comment: Deferred for safety        ADL either performed or assessed with clinical judgement   ADL      General ADL Comments: dependent +2 in all aspects of care               Cognition Arousal/Alertness: Lethargic Behavior During Therapy: Flat affect Overall Cognitive Status: Impaired/Different from baseline    General Comments: difficult to assess secondary to lethargy and nonverbal at this time                   Pertinent Vitals/ Pain       Pain Assessment: Faces Faces Pain Scale: No hurt Pain Location: patient unable to report pain at this time. no signs or symptoms of pain during session   Home Living Family/patient expects to be discharged to:: Skilled nursing facility               Frequency  Min 1X/week        Progress Toward Goals  OT Goals(current goals can now be found in the care plan section)  Progress towards OT goals: Not progressing toward goals - comment  Acute Rehab OT Goals Patient Stated Goal: patient unable to participate with goal setting  OT Goal Formulation: Patient unable to participate in goal setting Time For Goal Achievement: 09/03/19 Potential to Achieve Goals: Poor  Plan  Discharge plan remains appropriate       AM-PAC OT "6 Clicks" Daily Activity     Outcome Measure   Help from another person eating meals?: Total Help from another person taking care of personal grooming?: Total Help from another person toileting, which includes using toliet, bedpan, or urinal?: Total Help from another person bathing (including washing, rinsing, drying)?: Total Help from another person to put on and taking off regular upper body clothing?: Total Help from another person to put  on and taking off regular lower body clothing?: Total 6 Click Score: 6    End of Session Equipment Utilized During Treatment: Oxygen  OT Visit Diagnosis: Other abnormalities of gait and mobility (R26.89);Muscle weakness (generalized) (M62.81)   Activity Tolerance Patient limited by lethargy   Patient Left in bed;with call bell/phone within reach;with bed alarm set   Nurse Communication Other (comment) (discussed OT discharge secondary to lack of progress/appropriateness at this time)        Time: 1007-1219 OT Time Calculation (min): 24 min  Charges: OT General Charges $OT Visit: 1 Visit OT Treatments $Self Care/Home Management : 8-22 mins $Therapeutic Activity: 8-22 mins  Jackquline Denmark, MS, OTR/L , CBIS ascom [phone]  08/30/19, 12:04 PM

## 2019-08-30 NOTE — Progress Notes (Addendum)
Progress Note    Melanie Lewis  KMQ:[id number] DOB: [birth year]  DOA: 07/28/2019 PCP: Leonel Ramsay, MD      Brief Narrative:    Medical records reviewed and are as summarized below:  Melanie Lewis is a 85 y.o. female with history of dementia, paroxysmal SVT, diastolic CHF, CAD, HTN and hypothyroidism admitted to ICU on 07/28/2019 with acute respiratory failure in the setting of severe angioedema from ACE inhibitor's requiring intubation with mechanical ventilation until 08/19/2019.   Hospital course complicated by intermittent A. fib with RVR and acute metabolic encephalopathy. Cardiology and neurology consulted. MRI brain negative for acute intracranial finding. Neurology query "apathic hyperthyroidism". She was a started on Provigil with minimal improvement in her mental status. Palliative medicine involved.  [facility] was transferred [facility] on 08/21/2019.      Assessment/Plan:   Active Problems:   Chronic diastolic heart failure (HCC)   History of non-ST elevation myocardial infarction (NSTEMI)   Dementia without behavioral disturbance (HCC)   Angioedema   Acute parotitis   Atrial fibrillation with RVR (HCC)   Dysphagia   Nutrition Problem: Inadequate oral intake Etiology: inability to eat (pt sedated and ventilated)  Signs/Symptoms: NPO status   Body mass index is 33.1 kg/m.  Diet Order            Diet NPO time specified  Diet effective ____                  Acute metabolic encephalopathy Acute parotitis, fever of 102.7 on 08/28/2019 Dementia Severe dysphagia Hypernatremia Hypokalemia A. fib with RVR History of proximal SVT Hyperthyroidism-TSH 0.014, free T4 1.65 Chronic diastolic CHF Hypertension CAD with history of NSTEMI   PLAN  Patient is stable.  Unable to take any medication by mouth because of severe dysphagia. Plan for PEG tube placement on 09/01/2019 by interventional radiologist. Continue  IV fluids for hydration Continue IV Cardizem. Continue IV Unasyn for parotitis Propranolol via NG tube has been discontinued since patient doesn't have an NG tube in place. Will consider IV metoprolol as needed for tachycardia. Discontinue IV Lasix Monitor BMP.       Medications:   . chlorhexidine gluconate (MEDLINE KIT)  15 mL Mouth Rinse BID  . Chlorhexidine Gluconate Cloth  6 each Topical Daily  . diltiazem  3 mg Intravenous Q6H  . enoxaparin (LOVENOX) injection  40 mg Subcutaneous Q24H  . free water  60 mL Per Tube Q4H  . insulin aspart  0-20 Units Subcutaneous Q4H  . sodium chloride flush  3 mL Intravenous Q12H   Continuous Infusions: . sodium chloride 250 mL (08/10/19 1547)  . ampicillin-sulbactam (UNASYN) IV 3 g (08/30/19 1116)  . dextrose 5 % and 0.45 % NaCl with KCl 40 mEq/L    . potassium chloride       Anti-infectives (From admission, onward)   Start     Dose/Rate Route Frequency Ordered Stop   08/30/19 0900  vancomycin (VANCOREADY) IVPB 750 mg/150 mL  Status:  Discontinued        750 mg 150 mL/hr over 60 Minutes Intravenous Every 24 hours 08/29/19 1112 08/29/19 1141   08/29/19 1400  Ampicillin-Sulbactam (UNASYN) 3 g in sodium chloride 0.9 % 100 mL IVPB     Discontinue     3 g 200 mL/hr over 30 Minutes Intravenous Every 6 hours 08/29/19 1141     08/29/19 0900  vancomycin (VANCOREADY) IVPB 1750 mg/350 mL  1,750 mg 175 mL/hr over 120 Minutes Intravenous  Once 08/29/19 0724 08/29/19 1100   08/29/19 0830  ceFEPIme (MAXIPIME) 2 g in sodium chloride 0.9 % 100 mL IVPB  Status:  Discontinued        2 g 200 mL/hr over 30 Minutes Intravenous Every 12 hours 08/29/19 0722 08/29/19 1141   08/28/19 0800  Ampicillin-Sulbactam (UNASYN) 3 g in sodium chloride 0.9 % 100 mL IVPB  Status:  Discontinued        3 g 200 mL/hr over 30 Minutes Intravenous Every 6 hours 08/28/19 0714 08/29/19 0711   08/02/19 1200  Ampicillin-Sulbactam (UNASYN) 3 g in sodium chloride 0.9 % 100  mL IVPB  Status:  Discontinued        3 g 200 mL/hr over 30 Minutes Intravenous Every 6 hours 08/02/19 1014 08/07/19 1003             Family Communication/Anticipated D/C date and plan/Code Status   DVT prophylaxis: enoxaparin (LOVENOX) injection 40 mg Start: 08/09/19 1000 SCDs Start: 07/28/19 1541     Code Status: Partial Code  Family Communication: None Disposition Plan:    Status is: Inpatient  Remains inpatient appropriate because:IV treatments appropriate due to intensity of illness or inability to take PO and Inpatient level of care appropriate due to severity of illness   Dispo: The patient is from: Home              Anticipated d/c is to: SNF              Anticipated d/c date is: > 3 days              Patient currently is not medically stable to d/c.           Subjective:   She is confused and unable to provide any history  Objective:    Vitals:   08/29/19 2138 08/29/19 2329 08/29/19 2346 08/30/19 0753  BP: 122/64  (!) 141/72 (!) 148/64  Pulse: 67 (!) 129 67 62  Resp: _0 Temp: 98.5 F (36.9 C)  98.4 F (36.9 C) 97.7 F (36.5 C)  TempSrc: Oral  Oral Oral  SpO2: 100%  100% 100%  Weight:      Height:       No data found.   Intake/Output Summary (Last 24 hours) at 08/30/2019 1423 Last data filed at 08/30/2019 1017 Gross per 24 hour  Intake 642.78 ml  Output 1125 ml  Net -482.22 ml   Filed Weights   08/25/19 0558 08/26/19 0535 08/28/19 0424  Weight: 79.9 kg 81.6 kg 82.1 kg    Exam:  GEN: NAD SKIN: Warm and dry EYES: EOMI ENT: MMM CV: RRR PULM: CTA B ABD: soft, ND, NT, +BS CNS: Alert.  Speech is soft and barely audible.  She is confused.  She is unable to move any of her extremities EXT: Mild edema bilateral upper extremities.  No edema lower extremities.   Data Reviewed:   I have personally reviewed following labs and imaging studies:  Labs: Labs show the following:   Basic Metabolic Panel: Recent Labs  Lab  08/26/19 0500 08/26/19 0500 08/27/19 0536 08/27/19 0536 08/28/19 0254 08/28/19 0254 08/29/19 0519 08/30/19 0512  NA 137  --  139  --  139  --  145 146*  K 4.8   < > 4.8   < > 5.0   < > 4.4 3.3*  CL 98  --  98  --  96*  --  98 104  CO2 31  --  29  --  32  --  32 30  GLUCOSE 241*  --  223*  --  244*  --  97 124*  BUN 34*  --  42*  --  38*  --  47* 56*  CREATININE 0.63  --  0.86  --  0.67  --  0.76 0.83  CALCIUM 8.5*  --  8.4*  --  8.7*  --  8.4* 8.1*  MG 2.0  --  2.0  --  2.1  --  2.3 2.4  PHOS 2.7  --  2.5  --  2.4*  --  4.5 4.5   < > = values in this interval not displayed.   GFR Estimated Creatinine Clearance: 51.9 mL/min (by C-G formula based on SCr of 0.83 mg/dL). Liver Function Tests: Recent Labs  Lab 08/26/19 0500 08/27/19 0536 08/28/19 0254 08/29/19 0519 08/30/19 0512  ALBUMIN 2.1* 2.0* 2.2* 2.1* 1.9*   No results for input(s): LIPASE, AMYLASE in the last 168 hours. No results for input(s): AMMONIA in the last 168 hours. Coagulation profile No results for input(s): INR, PROTIME in the last 168 hours.  CBC: Recent Labs  Lab 08/26/19 0500 08/27/19 0536 08/28/19 0254 08/29/19 0519 08/30/19 0512  WBC 8.9 8.1 15.1* 22.5* 17.0*  NEUTROABS 6.4 5.6 11.6* 16.4* 12.9*  HGB 12.2 11.5* 12.2 12.7 11.4*  HCT 38.8 36.7 39.1 39.8 36.7  MCV 93.3 95.1 94.2 91.9 95.1  PLT 151 118* 183 189 182   Cardiac Enzymes: No results for input(s): CKTOTAL, CKMB, CKMBINDEX, TROPONINI in the last 168 hours. BNP (last 3 results) No results for input(s): PROBNP in the last 8760 hours. CBG: Recent Labs  Lab 08/29/19 2000 08/29/19 2353 08/30/19 0432 08/30/19 0754 08/30/19 1138  GLUCAP 172* 128* 115* 99 115*   D-Dimer: No results for input(s): DDIMER in the last 72 hours. Hgb A1c: No results for input(s): HGBA1C in the last 72 hours. Lipid Profile: No results for input(s): CHOL, HDL, LDLCALC, TRIG, CHOLHDL, LDLDIRECT in the last 72 hours. Thyroid function studies: No results  for input(s): TSH, T4TOTAL, T3FREE, THYROIDAB in the last 72 hours.  Invalid input(s): FREET3 Anemia work up: No results for input(s): VITAMINB12, FOLATE, FERRITIN, TIBC, IRON, RETICCTPCT in the last 72 hours. Sepsis Labs: Recent Labs  Lab 08/27/19 0536 08/28/19 0254 08/29/19 0519 08/30/19 0512  PROCALCITON  --  0.53  --   --   WBC 8.1 15.1* 22.5* 17.0*    Microbiology Recent Results (from the past 240 hour(s))  CULTURE, BLOOD (ROUTINE X 2) w Reflex to ID Panel     Status: None (Preliminary result)   Collection Time: 08/28/19  9:29 AM   Specimen: BLOOD  Result Value Ref Range Status   Specimen Description BLOOD BLOOD RIGHT HAND  Final   Special Requests   Final    BOTTLES DRAWN AEROBIC ONLY Blood Culture adequate volume   Culture   Final    NO GROWTH 2 DAYS Performed at [facility], [address], [place], [place] [address]    Report Status PENDING  Incomplete  CULTURE, BLOOD (ROUTINE X 2) w Reflex to ID Panel     Status: None (Preliminary result)   Collection Time: 08/28/19 10:35 AM   Specimen: BLOOD  Result Value Ref Range Status   Specimen Description BLOOD RIGHT HAND  Final   Special Requests   Final    BOTTLES DRAWN AEROBIC AND ANAEROBIC Blood  Culture adequate volume   Culture   Final    NO GROWTH 2 DAYS Performed at [facility], [address] [place], [place] [address]    Report Status PENDING  Incomplete    Procedures and diagnostic studies:  CT ABDOMEN WO CONTRAST  Result Date: 08/29/2019 CLINICAL DATA:  Abdominal pain. EXAM: CT ABDOMEN WITHOUT CONTRAST TECHNIQUE: Multidetector CT imaging of the abdomen was performed following the standard protocol without IV contrast. COMPARISON:  None. FINDINGS: Lower chest: Mild to moderate severity atelectasis is seen along the posteromedial aspects of the bilateral lung bases. Hepatobiliary: No focal liver abnormality is seen. Status post cholecystectomy. No biliary dilatation. Pancreas:  Unremarkable. No pancreatic ductal dilatation or surrounding inflammatory changes. Spleen: Normal in size without focal abnormality. Adrenals/Urinary Tract: The right adrenal gland is normal in appearance. A 1.3 cm isodense left adrenal mass is seen (axial CT image 28, CT series number 2). Kidneys are normal in size, without renal calculi or hydronephrosis. A 2.2 cm diameter exophytic cyst is seen along the posterolateral aspect of the mid right kidney. An ill-defined 9 cm cystic appearing area is noted within the posterolateral aspect of the mid to lower left kidney. Stomach/Bowel: Stomach is within normal limits. The appendix is poorly visualized. No evidence of bowel dilatation. Noninflamed diverticula are seen throughout the large bowel. Vascular/Lymphatic: No significant vascular findings are present. No enlarged abdominal or pelvic lymph nodes. Other: No abdominal wall hernia or abnormality. Musculoskeletal: No acute or significant osseous findings. IMPRESSION: 1. 1.3 cm isodense left adrenal mass which may represent an adrenal adenoma. Correlation with adrenal protocol CT is recommended. 2. Bilateral renal cysts. 3. Noninflamed diverticula throughout the large bowel. 4. Mild to moderate severity atelectasis along the posteromedial aspects of the bilateral lung bases. 5. Evidence of prior cholecystectomy. Electronically Signed   By: Virgina Norfolk M.D.   On: 08/29/2019 20:52   CT MAXILLOFACIAL W CONTRAST  Result Date: 08/29/2019 CLINICAL DATA:  Right facial swelling. EXAM: CT MAXILLOFACIAL WITH CONTRAST TECHNIQUE: Multidetector CT imaging of the maxillofacial structures was performed with intravenous contrast. Multiplanar CT image reconstructions were also generated. CONTRAST:  60m OMNIPAQUE IOHEXOL 300 MG/ML  SOLN COMPARISON:  None. FINDINGS: Osseous: Negative for fracture. No skeletal lesion. No dental abscess identified. Orbits: Normal orbit bilaterally. Globe is normal. No orbital mass or edema  Sinuses: Extensive mucosal edema left maxillary sinus and right sphenoid sinus. Remaining sinuses clear. Soft tissues: Soft tissue swelling in the right neck along the platysmas muscle. This extends to the right parotid gland where there is asymmetric enlargement and heterogeneous enhancement. No abscess mass or stone. There is also edema extending around the right submandibular gland which shows mild hyperenhancement. Findings compatible with right-sided parotitis and submandibular gland inflammation. Left parotid normal.  Left neck negative. Limited intracranial: No acute intracranial abnormality. IMPRESSION: Enlargement of the right parotid gland with soft tissue swelling throughout the right neck. There is also hyperenhancement and edema around the right submandibular gland. Findings compatible with right-sided parotitis and submandibular gland inflammation. No soft tissue abscess. Near complete opacification of the left maxillary sinus and right sphenoid sinus. Electronically Signed   By: CFranchot GalloM.D.   On: 08/29/2019 10:36               LOS: 33 days   Prisca Gearing  [facility]   Pager on [email or web address] If 7PM-7AM, please contact night-coverage at [email or web address]     08/30/2019, 2:23 PM

## 2019-08-30 NOTE — Progress Notes (Addendum)
Nutrition Follow-up  DOCUMENTATION CODES:   Obesity unspecified  INTERVENTION:  Once G-tube placed and okay to use: -Resume Osmolite 1.5 Cal at 60 mL/hr + PROSource TF 45 mL daily -Provides 2200 kcal, 101 grams of protein, 1094 mL H2O daily -Recommend free water flush of 120 mL Q4hrs (provides total of 1814 mL H2O daily including water in TF regimen)  NUTRITION DIAGNOSIS:   Inadequate oral intake related to inability to eat as evidenced by NPO status.  Ongoing.  GOAL:   Patient will meet greater than or equal to 90% of their needs  Not met.  MONITOR:   Labs, Weight trends, TF tolerance, Skin, I & O's  REASON FOR ASSESSMENT:   Consult Enteral/tube feeding initiation and management  ASSESSMENT:   85 y/o female with h/o HTN, CHF, CAD, dementia, MI and hyperthyroidism who is admitted with angioedema requiring intubation.  8/6 extubated 8/8 NGT placed 8/9 tube feeds resumed 8/13 SLP evaluation: pt to remain NPO 8/16 NGT was removed and diet was advanced to dysphagia 1 with thin liquids per family request; however pt was not able to swallow any puree foods and had to be orally suctioned  Patient sleeping at time of RD assessment today. RN and family member present in room. Family member reports patient was not able to tolerate any oral intake. She is requesting diet order be discontinued to trays are not still sent to room. Messaged MD and he discontinued diet order. Plan is for placement of G-tube on 8/20 by IR. Plan is to resume tube feeds after G-tube placement.   Medications reviewed and include: Novolog 0-20 units Q4hrs, Unasyn, D5-1/2NS at 75 mL/hr, potassium chloride 10 mEq IV x4 today.  Labs reviewed: CBG 99-128, Sodium 146, Potassium 3.3, BUN 56.  I/O: 1125 mL UOP yesterday (0.6 mL/kg/hr)  Weight trend: 82.1 kg on 8/16; -7.6 kg from 7/16  Diet Order:   Diet Order            Diet NPO time specified  Diet effective now                EDUCATION NEEDS:    No education needs have been identified at this time  Skin:  Skin Assessment: Reviewed RN Assessment  Last BM:  08/30/2019 - small type 7  Height:   Ht Readings from Last 1 Encounters:  08/23/19 _0  (1.575 m)   Weight:   Wt Readings from Last 1 Encounters:  08/31/19 84.3 kg   Ideal Body Weight:  50 kg  BMI:  Body mass index is 33.99 kg/m.  Estimated Nutritional Needs:   Kcal:  2050-2250  Protein:  100-115 grams  Fluid:  1.8-2 L/day  Jacklynn Barnacle, MS, RD, LDN Pager number available on [email or web address]

## 2019-08-31 LAB — CBC WITH DIFFERENTIAL/PLATELET
Abs Immature Granulocytes: 0.11 10*3/uL — ABNORMAL HIGH (ref 0.00–0.07)
Basophils Absolute: 0 10*3/uL (ref 0.0–0.1)
Basophils Relative: 0 %
Eosinophils Absolute: 0.1 10*3/uL (ref 0.0–0.5)
Eosinophils Relative: 1 %
HCT: 34.3 % — ABNORMAL LOW (ref 36.0–46.0)
Hemoglobin: 10.9 g/dL — ABNORMAL LOW (ref 12.0–15.0)
Immature Granulocytes: 1 %
Lymphocytes Relative: 17 %
Lymphs Abs: 1.9 10*3/uL (ref 0.7–4.0)
MCH: 29.5 pg (ref 26.0–34.0)
MCHC: 31.8 g/dL (ref 30.0–36.0)
MCV: 92.7 fL (ref 80.0–100.0)
Monocytes Absolute: 1.1 10*3/uL — ABNORMAL HIGH (ref 0.1–1.0)
Monocytes Relative: 10 %
Neutro Abs: 7.9 10*3/uL — ABNORMAL HIGH (ref 1.7–7.7)
Neutrophils Relative %: 71 %
Platelets: 202 10*3/uL (ref 150–400)
RBC: 3.7 MIL/uL — ABNORMAL LOW (ref 3.87–5.11)
RDW: 15.3 % (ref 11.5–15.5)
WBC: 11 10*3/uL — ABNORMAL HIGH (ref 4.0–10.5)
nRBC: 0.3 % — ABNORMAL HIGH (ref 0.0–0.2)

## 2019-08-31 LAB — RENAL FUNCTION PANEL
Albumin: 1.9 g/dL — ABNORMAL LOW (ref 3.5–5.0)
Anion gap: 10 (ref 5–15)
BUN: 49 mg/dL — ABNORMAL HIGH (ref 8–23)
CO2: 29 mmol/L (ref 22–32)
Calcium: 8.1 mg/dL — ABNORMAL LOW (ref 8.9–10.3)
Chloride: 110 mmol/L (ref 98–111)
Creatinine, Ser: 0.81 mg/dL (ref 0.44–1.00)
GFR calc Af Amer: >60 mL/min (ref >60)
GFR calc non Af Amer: >60 mL/min (ref >60)
Glucose, Bld: 119 mg/dL — ABNORMAL HIGH (ref 70–99)
Phosphorus: 3.1 mg/dL (ref 2.5–4.6)
Potassium: 4 mmol/L (ref 3.5–5.1)
Sodium: 149 mmol/L — ABNORMAL HIGH (ref 135–145)

## 2019-08-31 LAB — PROTIME-INR
INR: 1.2 (ref 0.8–1.2)
Prothrombin Time: 14.5 seconds (ref 11.4–15.2)

## 2019-08-31 LAB — MAGNESIUM: Magnesium: 2.7 mg/dL — ABNORMAL HIGH (ref 1.7–2.4)

## 2019-08-31 LAB — GLUCOSE, CAPILLARY
Glucose-Capillary: 110 mg/dL — ABNORMAL HIGH (ref 70–99)
Glucose-Capillary: 108 mg/dL — ABNORMAL HIGH (ref 70–99)
Glucose-Capillary: 119 mg/dL — ABNORMAL HIGH (ref 70–99)
Glucose-Capillary: 132 mg/dL — ABNORMAL HIGH (ref 70–99)
Glucose-Capillary: 131 mg/dL — ABNORMAL HIGH (ref 70–99)

## 2019-08-31 MED ORDER — DIGOXIN 0.25 MG/ML IJ SOLN
0.2500 mg | Freq: Once | INTRAMUSCULAR | Status: AC
  Administered 2019-08-31: 0.25 mg via INTRAVENOUS
  Filled 2019-08-31: qty 2

## 2019-08-31 MED ORDER — ENOXAPARIN SODIUM 40 MG/0.4ML ~~LOC~~ SOLN
40.0000 mg | SUBCUTANEOUS | Status: DC
  Administered 2019-09-02 – 2019-09-05 (×4): 40 mg via SUBCUTANEOUS
  Filled 2019-08-31 (×4): qty 0.4

## 2019-08-31 MED ORDER — POTASSIUM CL IN DEXTROSE 5% 20 MEQ/L IV SOLN
20.0000 meq | INTRAVENOUS | Status: DC
  Administered 2019-08-31 – 2019-09-01 (×3): 20 meq via INTRAVENOUS
  Filled 2019-08-31 (×5): qty 1000

## 2019-08-31 MED ORDER — DILTIAZEM HCL 25 MG/5ML IV SOLN
5.0000 mg | Freq: Four times a day (QID) | INTRAVENOUS | Status: DC
  Administered 2019-08-31 – 2019-09-02 (×8): 5 mg via INTRAVENOUS
  Filled 2019-08-31 (×10): qty 5

## 2019-08-31 NOTE — Progress Notes (Addendum)
Progress Note    Melanie Lewis  QMG:[id number] DOB: 1934-09-15  DOA: 07/28/2019 PCP: Leonel Ramsay, MD      Brief Narrative:    Medical records reviewed and are as summarized below:  Melanie Lewis is a 85 y.o. female with history of dementia, paroxysmal SVT, diastolic CHF, CAD, HTN and hypothyroidism admitted to ICU on 07/28/2019 with acute respiratory failure in the setting of severe angioedema from ACE inhibitor's requiring intubation with mechanical ventilation until 08/19/2019.   Hospital course complicated by intermittent A. fib with RVR and acute metabolic encephalopathy. Cardiology and neurology consulted. MRI brain negative for acute intracranial finding. Neurology query "apathic hyperthyroidism". She was a started on Provigil with minimal improvement in her mental status. Palliative medicine involved.  [facility] on 08/21/2019.      Assessment/Plan:   Active Problems:   Chronic diastolic heart failure (HCC)   History of non-ST elevation myocardial infarction (NSTEMI)   Dementia without behavioral disturbance (HCC)   Angioedema   Acute parotitis   Atrial fibrillation with RVR (HCC)   Dysphagia   Nutrition Problem: Inadequate oral intake Etiology: inability to eat  Signs/Symptoms: NPO status   Body mass index is 33.99 kg/m.  Diet Order            Diet NPO time specified  Diet effective now                  Acute metabolic encephalopathy Acute parotitis, fever of 102.7 on 08/28/2019 Dementia Severe dysphagia Hypernatremia-worsening Hypokalemia-improved A. fib with RVR-heart rate is still uncontrolled History of proximal SVT Hyperthyroidism-TSH 0.014, free T4 8.91 Chronic diastolic CHF Hypertension CAD with history of NSTEMI   PLAN  Unable to take any medication by mouth because of severe dysphagia. Plan for PEG tube placement tomorrow by interventional radiologist. Continue  IV fluids for hydration (changed D5 half-normal saline with KCl to D5W with KCl) IV digoxin 0.25 mg was given for rate control today. Continue IV Cardizem (increased dose for rate control). Continue IV Unasyn for parotitis Will consider IV metoprolol as needed for tachycardia. Monitor BMP.       Medications:   . chlorhexidine gluconate (MEDLINE KIT)  15 mL Mouth Rinse BID  . Chlorhexidine Gluconate Cloth  6 each Topical Daily  . diltiazem  5 mg Intravenous Q6H  . enoxaparin (LOVENOX) injection  40 mg Subcutaneous Q24H  . free water  60 mL Per Tube Q4H  . insulin aspart  0-20 Units Subcutaneous Q4H  . sodium chloride flush  3 mL Intravenous Q12H   Continuous Infusions: . sodium chloride 250 mL (08/10/19 1547)  . ampicillin-sulbactam (UNASYN) IV 3 g (08/31/19 0900)  . dextrose 5 % with KCl 20 mEq / L 20 mEq (08/31/19 1019)     Anti-infectives (From admission, onward)   Start     Dose/Rate Route Frequency Ordered Stop   08/30/19 0900  vancomycin (VANCOREADY) IVPB 750 mg/150 mL  Status:  Discontinued        750 mg 150 mL/hr over 60 Minutes Intravenous Every 24 hours 08/29/19 1112 08/29/19 1141   08/29/19 1400  Ampicillin-Sulbactam (UNASYN) 3 g in sodium chloride 0.9 % 100 mL IVPB        3 g 200 mL/hr over 30 Minutes Intravenous Every 6 hours 08/29/19 1141     08/29/19 0900  vancomycin (VANCOREADY) IVPB 1750 mg/350 mL        1,750 mg 175 mL/hr over  120 Minutes Intravenous  Once 08/29/19 0724 08/29/19 1100   08/29/19 0830  ceFEPIme (MAXIPIME) 2 g in sodium chloride 0.9 % 100 mL IVPB  Status:  Discontinued        2 g 200 mL/hr over 30 Minutes Intravenous Every 12 hours 08/29/19 0722 08/29/19 1141   08/28/19 0800  Ampicillin-Sulbactam (UNASYN) 3 g in sodium chloride 0.9 % 100 mL IVPB  Status:  Discontinued        3 g 200 mL/hr over 30 Minutes Intravenous Every 6 hours 08/28/19 0714 08/29/19 0711   08/02/19 1200  Ampicillin-Sulbactam (UNASYN) 3 g in sodium chloride 0.9 % 100 mL  IVPB  Status:  Discontinued        3 g 200 mL/hr over 30 Minutes Intravenous Every 6 hours 08/02/19 1014 08/07/19 1003             Family Communication/Anticipated D/C date and plan/Code Status   DVT prophylaxis: enoxaparin (LOVENOX) injection 40 mg Start: 08/09/19 1000 SCDs Start: 07/28/19 1541     Code Status: Partial Code  Family Communication: Called her daughter, [name], but there was no response Disposition Plan:    Status is: Inpatient  Remains inpatient appropriate because:IV treatments appropriate due to intensity of illness or inability to take PO and Inpatient level of care appropriate due to severity of illness   Dispo: The patient is from: Home              Anticipated d/c is to: SNF              Anticipated d/c date is: > 3 days              Patient currently is not medically stable to d/c.           Subjective:   She is confused and unable to provide any history  Objective:    Vitals:   08/30/19 1810 08/30/19 2339 08/31/19 0424 08/31/19 0723  BP: (!) 120/93 137/80  (!) 142/87  Pulse:  67  63  Resp:  16  16  Temp:  97.7 F (36.5 C)  98.7 F (37.1 C)  TempSrc:  Oral    SpO2:  100%  100%  Weight:   84.3 kg   Height:       No data found.   Intake/Output Summary (Last 24 hours) at 08/31/2019 1411 Last data filed at 08/31/2019 0900 Gross per 24 hour  Intake 600 ml  Output 1225 ml  Net -625 ml   Filed Weights   08/26/19 0535 08/28/19 0424 08/31/19 0424  Weight: 81.6 kg 82.1 kg 84.3 kg    Exam:  GEN: NAD SKIN: Warm and dry EYES: no pallor or icterus ENT: MMM CV: Irregular rate and rhythm, tachycardic PULM: No wheezing or rales heard ABD: soft, ND, NT, +BS CNS: Alert.  Speech is soft and barely audible.  She is confused.  She is unable to move any of her extremities EXT: Mild edema bilateral upper extremities.  No edema lower extremities. GU: Foley catheter in place   Data Reviewed:   I have personally reviewed  following labs and imaging studies:  Labs: Labs show the following:   Basic Metabolic Panel: Recent Labs  Lab 08/27/19 0536 08/27/19 0536 08/28/19 0254 08/28/19 0254 08/29/19 0519 08/29/19 0519 08/30/19 0512 08/31/19 0446  NA 139  --  139  --  145  --  146* 149*  K 4.8   < > 5.0   < > 4.4   < >  3.3* 4.0  CL 98  --  96*  --  98  --  104 110  CO2 29  --  32  --  32  --  30 29  GLUCOSE 223*  --  244*  --  97  --  124* 119*  BUN 42*  --  38*  --  47*  --  56* 49*  CREATININE 0.86  --  0.67  --  0.76  --  0.83 0.81  CALCIUM 8.4*  --  8.7*  --  8.4*  --  8.1* 8.1*  MG 2.0  --  2.1  --  2.3  --  2.4 2.7*  PHOS 2.5  --  2.4*  --  4.5  --  4.5 3.1   < > = values in this interval not displayed.   GFR Estimated Creatinine Clearance: 53.9 mL/min (by C-G formula based on SCr of 0.81 mg/dL). Liver Function Tests: Recent Labs  Lab 08/27/19 0536 08/28/19 0254 08/29/19 0519 08/30/19 0512 08/31/19 0446  ALBUMIN 2.0* 2.2* 2.1* 1.9* 1.9*   No results for input(s): LIPASE, AMYLASE in the last 168 hours. No results for input(s): AMMONIA in the last 168 hours. Coagulation profile Recent Labs  Lab 08/31/19 0446  INR 1.2    CBC: Recent Labs  Lab 08/27/19 0536 08/28/19 0254 08/29/19 0519 08/30/19 0512 08/31/19 0446  WBC 8.1 15.1* 22.5* 17.0* 11.0*  NEUTROABS 5.6 11.6* 16.4* 12.9* 7.9*  HGB 11.5* 12.2 12.7 11.4* 10.9*  HCT 36.7 39.1 39.8 36.7 34.3*  MCV 95.1 94.2 91.9 95.1 92.7  PLT 118* 183 189 182 202   Cardiac Enzymes: No results for input(s): CKTOTAL, CKMB, CKMBINDEX, TROPONINI in the last 168 hours. BNP (last 3 results) No results for input(s): PROBNP in the last 8760 hours. CBG: Recent Labs  Lab 08/30/19 1952 08/30/19 2335 08/31/19 0412 08/31/19 0805 08/31/19 1103  GLUCAP 109* 131* 110* 108* 119*   D-Dimer: No results for input(s): DDIMER in the last 72 hours. Hgb A1c: No results for input(s): HGBA1C in the last 72 hours. Lipid Profile: No results for  input(s): CHOL, HDL, LDLCALC, TRIG, CHOLHDL, LDLDIRECT in the last 72 hours. Thyroid function studies: No results for input(s): TSH, T4TOTAL, T3FREE, THYROIDAB in the last 72 hours.  Invalid input(s): FREET3 Anemia work up: No results for input(s): VITAMINB12, FOLATE, FERRITIN, TIBC, IRON, RETICCTPCT in the last 72 hours. Sepsis Labs: Recent Labs  Lab 08/28/19 0254 08/29/19 0519 08/30/19 0512 08/31/19 0446  PROCALCITON 0.53  --   --   --   WBC 15.1* 22.5* 17.0* 11.0*    Microbiology Recent Results (from the past 240 hour(s))  CULTURE, BLOOD (ROUTINE X 2) w Reflex to ID Panel     Status: None (Preliminary result)   Collection Time: 08/28/19  9:29 AM   Specimen: BLOOD  Result Value Ref Range Status   Specimen Description BLOOD BLOOD RIGHT HAND  Final   Special Requests   Final    BOTTLES DRAWN AEROBIC ONLY Blood Culture adequate volume   Culture   Final    NO GROWTH 3 DAYS Performed at [facility], [address] [place], [place] [address]    Report Status PENDING  Incomplete  CULTURE, BLOOD (ROUTINE X 2) w Reflex to ID Panel     Status: None (Preliminary result)   Collection Time: 08/28/19 10:35 AM   Specimen: BLOOD  Result Value Ref Range Status   Specimen Description BLOOD RIGHT HAND  Final   Special Requests  Final    BOTTLES DRAWN AEROBIC AND ANAEROBIC Blood Culture adequate volume   Culture   Final    NO GROWTH 3 DAYS Performed at [facility], [address] [place], [place] [address]    Report Status PENDING  Incomplete    Procedures and diagnostic studies:  CT ABDOMEN WO CONTRAST  Result Date: 08/29/2019 CLINICAL DATA:  Abdominal pain. EXAM: CT ABDOMEN WITHOUT CONTRAST TECHNIQUE: Multidetector CT imaging of the abdomen was performed following the standard protocol without IV contrast. COMPARISON:  None. FINDINGS: Lower chest: Mild to moderate severity atelectasis is seen along the posteromedial aspects of the bilateral lung bases.  Hepatobiliary: No focal liver abnormality is seen. Status post cholecystectomy. No biliary dilatation. Pancreas: Unremarkable. No pancreatic ductal dilatation or surrounding inflammatory changes. Spleen: Normal in size without focal abnormality. Adrenals/Urinary Tract: The right adrenal gland is normal in appearance. A 1.3 cm isodense left adrenal mass is seen (axial CT image 28, CT series number 2). Kidneys are normal in size, without renal calculi or hydronephrosis. A 2.2 cm diameter exophytic cyst is seen along the posterolateral aspect of the mid right kidney. An ill-defined 9 cm cystic appearing area is noted within the posterolateral aspect of the mid to lower left kidney. Stomach/Bowel: Stomach is within normal limits. The appendix is poorly visualized. No evidence of bowel dilatation. Noninflamed diverticula are seen throughout the large bowel. Vascular/Lymphatic: No significant vascular findings are present. No enlarged abdominal or pelvic lymph nodes. Other: No abdominal wall hernia or abnormality. Musculoskeletal: No acute or significant osseous findings. IMPRESSION: 1. 1.3 cm isodense left adrenal mass which may represent an adrenal adenoma. Correlation with adrenal protocol CT is recommended. 2. Bilateral renal cysts. 3. Noninflamed diverticula throughout the large bowel. 4. Mild to moderate severity atelectasis along the posteromedial aspects of the bilateral lung bases. 5. Evidence of prior cholecystectomy. Electronically Signed   By: Virgina Norfolk M.D.   On: 08/29/2019 20:52               LOS: 34 days   David Towson  [facility]   Pager on [email or web address]. If 7PM-7AM, please contact night-coverage at [email or web address]     08/31/2019, 2:11 PM

## 2019-08-31 NOTE — Progress Notes (Signed)
Scheduled for gastrostomy tube tomorrow around 9am. Daughter aware and signed consent. Cardiazem and lanoxin adm today for increased heart rate. Heart rate now around 100 to 120 atrial fib. Daughter questioning plan for long term and showed me a video of her mom dancing just prior to admission.

## 2019-08-31 NOTE — Progress Notes (Signed)
PT Cancellation Note  Patient Details Name: Melanie Lewis MRN: [id number] DOB: 07/27/1934   Cancelled Treatment:    Reason Eval/Treat Not Completed: Patient not medically ready.  Chart reviewed.  Pt's HR noted to be elevated and fluctuating between 133-152 bpm at rest (discussed with pt's nurse); d/t this will hold PT at this time and re-attempt PT treatment session at a later date/time as medically appropriate.  Hendricks Limes, PT 08/31/19, 9:17 AM

## 2019-08-31 NOTE — Care Management Important Message (Signed)
Important Message  Patient Details  Name: Melanie Lewis MRN: [id number] Date of Birth: 1934/02/07   Medicare Important Message Given:  Yes     Bernadette Hoit 08/31/2019, 10:48 AM

## 2019-09-01 ENCOUNTER — Inpatient Hospital Stay: Payer: Medicare Other

## 2019-09-01 HISTORY — PX: IR GASTROSTOMY TUBE MOD SED: IMG625

## 2019-09-01 LAB — RENAL FUNCTION PANEL
Albumin: 1.8 g/dL — ABNORMAL LOW (ref 3.5–5.0)
Anion gap: 7 (ref 5–15)
BUN: 28 mg/dL — ABNORMAL HIGH (ref 8–23)
CO2: 30 mmol/L (ref 22–32)
Calcium: 8 mg/dL — ABNORMAL LOW (ref 8.9–10.3)
Chloride: 110 mmol/L (ref 98–111)
Creatinine, Ser: 0.73 mg/dL (ref 0.44–1.00)
GFR calc Af Amer: >60 mL/min (ref >60)
GFR calc non Af Amer: >60 mL/min (ref >60)
Glucose, Bld: 109 mg/dL — ABNORMAL HIGH (ref 70–99)
Phosphorus: 2.5 mg/dL (ref 2.5–4.6)
Potassium: 3.6 mmol/L (ref 3.5–5.1)
Sodium: 147 mmol/L — ABNORMAL HIGH (ref 135–145)

## 2019-09-01 LAB — CBC WITH DIFFERENTIAL/PLATELET
Abs Immature Granulocytes: 0.1 10*3/uL — ABNORMAL HIGH (ref 0.00–0.07)
Basophils Absolute: 0 10*3/uL (ref 0.0–0.1)
Basophils Relative: 0 %
Eosinophils Absolute: 0 10*3/uL (ref 0.0–0.5)
Eosinophils Relative: 1 %
HCT: 34 % — ABNORMAL LOW (ref 36.0–46.0)
Hemoglobin: 10.5 g/dL — ABNORMAL LOW (ref 12.0–15.0)
Immature Granulocytes: 1 %
Lymphocytes Relative: 24 %
Lymphs Abs: 1.8 10*3/uL (ref 0.7–4.0)
MCH: 29.6 pg (ref 26.0–34.0)
MCHC: 30.9 g/dL (ref 30.0–36.0)
MCV: 95.8 fL (ref 80.0–100.0)
Monocytes Absolute: 0.8 10*3/uL (ref 0.1–1.0)
Monocytes Relative: 11 %
Neutro Abs: 4.8 10*3/uL (ref 1.7–7.7)
Neutrophils Relative %: 63 %
Platelets: 205 10*3/uL (ref 150–400)
RBC: 3.55 MIL/uL — ABNORMAL LOW (ref 3.87–5.11)
RDW: 15.1 % (ref 11.5–15.5)
WBC: 7.6 10*3/uL (ref 4.0–10.5)
nRBC: 0.7 % — ABNORMAL HIGH (ref 0.0–0.2)

## 2019-09-01 LAB — GLUCOSE, CAPILLARY
Glucose-Capillary: 105 mg/dL — ABNORMAL HIGH (ref 70–99)
Glucose-Capillary: 105 mg/dL — ABNORMAL HIGH (ref 70–99)
Glucose-Capillary: 141 mg/dL — ABNORMAL HIGH (ref 70–99)
Glucose-Capillary: 85 mg/dL (ref 70–99)
Glucose-Capillary: 89 mg/dL (ref 70–99)

## 2019-09-01 LAB — CREATININE, SERUM
Creatinine, Ser: 0.74 mg/dL (ref 0.44–1.00)
GFR calc Af Amer: >60 mL/min (ref >60)
GFR calc non Af Amer: >60 mL/min (ref >60)

## 2019-09-01 LAB — MAGNESIUM: Magnesium: 2.6 mg/dL — ABNORMAL HIGH (ref 1.7–2.4)

## 2019-09-01 MED ORDER — CEFAZOLIN SODIUM-DEXTROSE 2-4 GM/100ML-% IV SOLN
INTRAVENOUS | Status: AC
  Filled 2019-09-01: qty 100

## 2019-09-01 MED ORDER — OSMOLITE 1.2 CAL PO LIQD: 1000.0000 mL | ORAL | Status: DC

## 2019-09-01 MED ORDER — FENTANYL CITRATE (PF) 100 MCG/2ML IJ SOLN
INTRAMUSCULAR | Status: AC
  Filled 2019-09-01: qty 2

## 2019-09-01 MED ORDER — CEFAZOLIN SODIUM-DEXTROSE 2-4 GM/100ML-% IV SOLN
INTRAVENOUS | Status: AC
  Administered 2019-09-01: 2 g via INTRAVENOUS
  Filled 2019-09-01: qty 100

## 2019-09-01 MED ORDER — IODIXANOL 320 MG/ML IV SOLN: 50.0000 mL | Freq: Once | INTRAVENOUS | Status: DC | PRN

## 2019-09-01 MED ORDER — MIDAZOLAM HCL 2 MG/2ML IJ SOLN
INTRAMUSCULAR | Status: AC
  Filled 2019-09-01: qty 2

## 2019-09-01 MED ORDER — FENTANYL CITRATE (PF) 100 MCG/2ML IJ SOLN
INTRAMUSCULAR | Status: AC | PRN
Start: 2019-09-01 — End: 2019-09-01
  Administered 2019-09-01: 25 ug via INTRAVENOUS

## 2019-09-01 MED ORDER — DIGOXIN 0.25 MG/ML IJ SOLN
0.1250 mg | Freq: Once | INTRAMUSCULAR | Status: AC
  Administered 2019-09-01: 0.125 mg via INTRAVENOUS
  Filled 2019-09-01: qty 2

## 2019-09-01 MED ORDER — MIDAZOLAM HCL 2 MG/2ML IJ SOLN
INTRAMUSCULAR | Status: AC | PRN
  Administered 2019-09-01: 0.5 mg via INTRAVENOUS

## 2019-09-01 MED ORDER — FREE WATER
120.0000 mL | Status: DC
  Administered 2019-09-02 – 2019-09-05 (×18): 120 mL

## 2019-09-01 MED ORDER — CEFAZOLIN SODIUM-DEXTROSE 2-4 GM/100ML-% IV SOLN
2.0000 g | Freq: Once | INTRAVENOUS | Status: AC
  Filled 2019-09-01: qty 100

## 2019-09-01 MED ORDER — PROSOURCE TF PO LIQD
45.0000 mL | Freq: Every day | ORAL | Status: DC
  Administered 2019-09-02 – 2019-09-05 (×4): 45 mL
  Filled 2019-09-01 (×4): qty 45

## 2019-09-01 MED ORDER — OSMOLITE 1.5 CAL PO LIQD
1000.0000 mL | ORAL | Status: DC
  Administered 2019-09-02 – 2019-09-05 (×2): 1000 mL

## 2019-09-01 NOTE — Progress Notes (Signed)
Progress Note    SHAUNTA Lewis  FYT:[id number] DOB: [birth year]  DOA: 07/28/2019 PCP: Leonel Ramsay, MD      Brief Narrative:    Medical records reviewed and are as summarized below:  Melanie Lewis is a 85 y.o. female with history of dementia, paroxysmal SVT, diastolic CHF, CAD, HTN and hypothyroidism admitted to ICU on 07/28/2019 with acute respiratory failure in the setting of severe angioedema from ACE inhibitor's requiring intubation with mechanical ventilation until 08/19/2019.   Hospital course complicated by intermittent A. fib with RVR and acute metabolic encephalopathy. Cardiology and neurology consulted. MRI brain negative for acute intracranial finding. Neurology query "apathic hyperthyroidism". She was a started on Provigil with minimal improvement in her mental status. Palliative medicine involved.  [facility] was [facility] on 08/21/2019.      Assessment/Plan:   Active Problems:   Chronic diastolic heart failure (HCC)   History of non-ST elevation myocardial infarction (NSTEMI)   Dementia without behavioral disturbance (HCC)   Angioedema   Acute parotitis   Atrial fibrillation with RVR (HCC)   Dysphagia   Nutrition Problem: Inadequate oral intake Etiology: inability to eat  Signs/Symptoms: NPO status   Body mass index is 33.95 kg/m.  Diet Order            Diet NPO time specified  Diet effective now                  Acute metabolic encephalopathy Acute parotitis, fever of 102.7 on 08/28/2019 Dementia Severe dysphagia Hypernatremia-sodium is trending down Hypokalemia-improved A. fib with RVR-heart rate is still uncontrolled History of proximal SVT Hyperthyroidism-TSH 0.014, free T4 1.28 Hypoalbuminemia Chronic diastolic CHF Hypertension CAD with history of NSTEMI   PLAN  Unable to take any medication by mouth because of severe dysphagia. S/p PEG tube placement today, 09/01/2019. This  will be ready for use tomorrow. Continue IV fluids for hydration  Continue to replete potassium intravenously Repeat IV digoxin today but at a lower dose, 0.125 mg Continue IV Cardizem  Continue IV Unasyn for parotitis Will consider IV metoprolol as needed for tachycardia. Monitor BMP. Plan discussed with Wannetta Sender, her daughter, at the bedside.  She is visiting from [place].       Medications:   . chlorhexidine gluconate (MEDLINE KIT)  15 mL Mouth Rinse BID  . Chlorhexidine Gluconate Cloth  6 each Topical Daily  . digoxin  0.125 mg Intravenous Once  . diltiazem  5 mg Intravenous Q6H  . [START ON 09/02/2019] enoxaparin (LOVENOX) injection  40 mg Subcutaneous Q24H  . [START ON 09/02/2019] feeding supplement (PROSource TF)  45 mL Per Tube Daily  . fentaNYL      . [START ON 09/02/2019] free water  120 mL Per Tube Q4H  . insulin aspart  0-20 Units Subcutaneous Q4H  . midazolam      . sodium chloride flush  3 mL Intravenous Q12H   Continuous Infusions: . sodium chloride 250 mL (08/10/19 1547)  . ampicillin-sulbactam (UNASYN) IV Stopped (09/01/19 0158)  . ceFAZolin    . dextrose 5 % with KCl 20 mEq / L 75 mL/hr at 09/01/19 0500  . [START ON 09/02/2019] feeding supplement (OSMOLITE 1.5 CAL)       Anti-infectives (From admission, onward)   Start     Dose/Rate Route Frequency Ordered Stop   09/01/19 1000  ceFAZolin (ANCEF) IVPB 2g/100 mL premix        2 g 200 mL/hr over  30 Minutes Intravenous  Once 09/01/19 0955 09/01/19 1206   09/01/19 0950  ceFAZolin (ANCEF) 2-4 GM/100ML-% IVPB       Note to Pharmacy: Forestine Chute   : cabinet override      09/01/19 0950 09/01/19 2159   08/30/19 0900  vancomycin (VANCOREADY) IVPB 750 mg/150 mL  Status:  Discontinued        750 mg 150 mL/hr over 60 Minutes Intravenous Every 24 hours 08/29/19 1112 08/29/19 1141   08/29/19 1400  Ampicillin-Sulbactam (UNASYN) 3 g in sodium chloride 0.9 % 100 mL IVPB        3 g 200 mL/hr over 30 Minutes Intravenous  Every 6 hours 08/29/19 1141     08/29/19 0900  vancomycin (VANCOREADY) IVPB 1750 mg/350 mL        1,750 mg 175 mL/hr over 120 Minutes Intravenous  Once 08/29/19 0724 08/29/19 1100   08/29/19 0830  ceFEPIme (MAXIPIME) 2 g in sodium chloride 0.9 % 100 mL IVPB  Status:  Discontinued        2 g 200 mL/hr over 30 Minutes Intravenous Every 12 hours 08/29/19 0722 08/29/19 1141   08/28/19 0800  Ampicillin-Sulbactam (UNASYN) 3 g in sodium chloride 0.9 % 100 mL IVPB  Status:  Discontinued        3 g 200 mL/hr over 30 Minutes Intravenous Every 6 hours 08/28/19 0714 08/29/19 0711   08/02/19 1200  Ampicillin-Sulbactam (UNASYN) 3 g in sodium chloride 0.9 % 100 mL IVPB  Status:  Discontinued        3 g 200 mL/hr over 30 Minutes Intravenous Every 6 hours 08/02/19 1014 08/07/19 1003             Family Communication/Anticipated D/C date and plan/Code Status   DVT prophylaxis: enoxaparin (LOVENOX) injection 40 mg Start: 09/02/19 1000 SCDs Start: 07/28/19 1541     Code Status: Partial Code  Family Communication: Plan discussed with daughter, Tish (from [place]), at the bedside Disposition Plan:    Status is: Inpatient  Remains inpatient appropriate because:IV treatments appropriate due to intensity of illness or inability to take PO and Inpatient level of care appropriate due to severity of illness   Dispo: The patient is from: Home              Anticipated d/c is to: SNF              Anticipated d/c date is: > 3 days              Patient currently is not medically stable to d/c.           Subjective:   Interval events noted.  Patient is still tachycardic on telemetry  Objective:    Vitals:   09/01/19 1150 09/01/19 1207 09/01/19 1215 09/01/19 1230  BP: (!) 165/75 (!) 159/83  (!) 176/70  Pulse: (!) 110 (!) 112 (!) 122 (!) 111  Resp: 16 15 16 16   Temp:      TempSrc:      SpO2: 100% 98% 96% 99%  Weight:      Height:       No data found.   Intake/Output Summary  (Last 24 hours) at 09/01/2019 1426 Last data filed at 09/01/2019 0500 Gross per 24 hour  Intake 2053.94 ml  Output 950 ml  Net 1103.94 ml   Filed Weights   08/31/19 0424 09/01/19 0500 09/01/19 0941  Weight: 84.3 kg 84.2 kg 84.2 kg    Exam:  GEN: No  acute distress SKIN: Warm and dry EYES: no pallor or icterus ENT: MMM CV: Irregular rate and rhythm, tachycardic PULM: Clear to auscultation bilaterally ABD: soft, ND, NT, +BS CNS: Alert.  Speech is soft and barely audible.  She is confused.  She is unable to move any of her extremities EXT: Mild edema bilateral upper extremities.  No edema lower extremities. GU: Foley catheter in place   Data Reviewed:   I have personally reviewed following labs and imaging studies:  Labs: Labs show the following:   Basic Metabolic Panel: Recent Labs  Lab 08/28/19 0254 08/28/19 0254 08/29/19 0519 08/29/19 0519 08/30/19 0512 08/30/19 0512 08/31/19 0446 09/01/19 0643  NA 139  --  145  --  146*  --  149* 147*  K 5.0   < > 4.4   < > 3.3*   < > 4.0 3.6  CL 96*  --  98  --  104  --  110 110  CO2 32  --  32  --  30  --  29 30  GLUCOSE 244*  --  97  --  124*  --  119* 109*  BUN 38*  --  47*  --  56*  --  49* 28*  CREATININE 0.67  --  0.76  --  0.83  --  0.81 0.73  0.74  CALCIUM 8.7*  --  8.4*  --  8.1*  --  8.1* 8.0*  MG 2.1  --  2.3  --  2.4  --  2.7* 2.6*  PHOS 2.4*  --  4.5  --  4.5  --  3.1 2.5   < > = values in this interval not displayed.   GFR Estimated Creatinine Clearance: 54.5 mL/min (by C-G formula based on SCr of 0.74 mg/dL). Liver Function Tests: Recent Labs  Lab 08/28/19 0254 08/29/19 0519 08/30/19 0512 08/31/19 0446 09/01/19 0643  ALBUMIN 2.2* 2.1* 1.9* 1.9* 1.8*   No results for input(s): LIPASE, AMYLASE in the last 168 hours. No results for input(s): AMMONIA in the last 168 hours. Coagulation profile Recent Labs  Lab 08/31/19 0446  INR 1.2    CBC: Recent Labs  Lab 08/28/19 0254 08/29/19 0519  08/30/19 0512 08/31/19 0446 09/01/19 0643  WBC 15.1* 22.5* 17.0* 11.0* 7.6  NEUTROABS 11.6* 16.4* 12.9* 7.9* 4.8  HGB 12.2 12.7 11.4* 10.9* 10.5*  HCT 39.1 39.8 36.7 34.3* 34.0*  MCV 94.2 91.9 95.1 92.7 95.8  PLT 183 189 182 202 205   Cardiac Enzymes: No results for input(s): CKTOTAL, CKMB, CKMBINDEX, TROPONINI in the last 168 hours. BNP (last 3 results) No results for input(s): PROBNP in the last 8760 hours. CBG: Recent Labs  Lab 08/31/19 1635 08/31/19 2022 09/01/19 0008 09/01/19 0336 09/01/19 0807  GLUCAP 132* 131* 85 89 105*   D-Dimer: No results for input(s): DDIMER in the last 72 hours. Hgb A1c: No results for input(s): HGBA1C in the last 72 hours. Lipid Profile: No results for input(s): CHOL, HDL, LDLCALC, TRIG, CHOLHDL, LDLDIRECT in the last 72 hours. Thyroid function studies: No results for input(s): TSH, T4TOTAL, T3FREE, THYROIDAB in the last 72 hours.  Invalid input(s): FREET3 Anemia work up: No results for input(s): VITAMINB12, FOLATE, FERRITIN, TIBC, IRON, RETICCTPCT in the last 72 hours. Sepsis Labs: Recent Labs  Lab 08/28/19 0254 08/28/19 0254 08/29/19 0519 08/30/19 0512 08/31/19 0446 09/01/19 0643  PROCALCITON 0.53  --   --   --   --   --   WBC 15.1*   < >  22.5* 17.0* 11.0* 7.6   < > = values in this interval not displayed.    Microbiology Recent Results (from the past 240 hour(s))  CULTURE, BLOOD (ROUTINE X 2) w Reflex to ID Panel     Status: None (Preliminary result)   Collection Time: 08/28/19  9:29 AM   Specimen: BLOOD  Result Value Ref Range Status   Specimen Description BLOOD BLOOD RIGHT HAND  Final   Special Requests   Final    BOTTLES DRAWN AEROBIC ONLY Blood Culture adequate volume   Culture   Final    NO GROWTH 4 DAYS Performed at [facility] [address], [place], [place] [address]    Report Status PENDING  Incomplete  CULTURE, BLOOD (ROUTINE X 2) w Reflex to ID Panel     Status: None (Preliminary result)    Collection Time: 08/28/19 10:35 AM   Specimen: BLOOD  Result Value Ref Range Status   Specimen Description BLOOD RIGHT HAND  Final   Special Requests   Final    BOTTLES DRAWN AEROBIC AND ANAEROBIC Blood Culture adequate volume   Culture   Final    NO GROWTH 4 DAYS Performed at [facility], [address], [place], [place] [address]    Report Status PENDING  Incomplete    Procedures and diagnostic studies:  No results found.             LOS: 35 days   [place] [employer], advertising on [email or web address]. If 7PM-7AM, please contact night-coverage at [email or web address]     09/01/2019, 2:26 PM

## 2019-09-01 NOTE — Progress Notes (Signed)
Physical Therapy Treatment Patient Details Name: Melanie Lewis MRN: [id number] DOB: 1934/06/21 Today's Date: 09/01/2019    History of Present Illness Pt is an 85 y.o. female presenting to hospital 7/16 with lip swelling, tongue swelling, and some neck fullness; pt with SOB and difficulty breathing; intubated in ED emergently.  Pt admitted with severe acute hypoxic and hypercapnic respiratory failure from acute angioedema leading to severe airway obstruction; also AKI/renal failure.  During hospitalization pt found to have a-fib with RVR; pt also noted with metabolic encephalopathy.  PMH includes htn, CAD, CHF, dementia, heart attack, MI, bradycardia, PSVT.    PT Comments    Pt sleeping in bed upon PT arrival.  Initially pt able to be woken but would not initiate any movement/participate in sessions activities and would quickly fall back asleep.  Performed B UE PROM ex's and then started B LE PROM ex's.  Then pt appeared to wake up d/t discomfort with L knee PROM although pt appearing more comfortable with gentle L knee PROM repetition.  D/t pt appearing more awake, therapist asked pt if she enjoyed dancing and pt nodded her head yes.  Therapist attempted to have pt initiate movement with R UE but pt unable with max cueing.  Therapist then asked pt to wiggle her toes and pt able to perform this B.  Re-attempted to have pt flex R elbow to bring R hand to face and trace contraction noted; therapist then asked pt to extend R elbow to bring R hand away from face and trace contraction noted.  Therapist talking with pt but unable to get pt to verbalize any words but pt did appear to chuckle 2x's while therapist was talking with pt.  Therapist was setting pt up end of session when pt's daughter arrived and pt able to say "hi" to daughter but did not verbalize anything else.  Plan for percutaneous gastrostomy tube placement today.  Will continue to monitor pt's progress during hospital stay and update POC as  appropriate.   Follow Up Recommendations  SNF     Equipment Recommendations  Other (comment) (TBD at next facility)    Recommendations for Other Services OT consult     Precautions / Restrictions Precautions Precautions: Fall Precaution Comments: NPO; rectal tube Restrictions Weight Bearing Restrictions: No Other Position/Activity Restrictions: monitor HR    Mobility  Bed Mobility               General bed mobility comments: Deferred d/t significant weakness/impaired initiation of movement  Transfers                    Ambulation/Gait                 Stairs             Wheelchair Mobility    Modified Rankin (Stroke Patients Only)       Balance                                            Cognition Arousal/Alertness:  (initially lethargic but became more awake during session) Behavior During Therapy: Flat affect (appeared to chuckle a couple times during session) Overall Cognitive Status: Impaired/Different from baseline Area of Impairment: Attention;Following commands;Awareness                 Orientation Level:  (unable to verbalize to assess) Current  Attention Level: Selective   Following Commands: Follows one step commands inconsistently Safety/Judgement:  (unable to verbalize) Awareness: Anticipatory Problem Solving: Slow processing;Decreased initiation;Difficulty sequencing;Requires verbal cues;Requires tactile cues General Comments: Pt overall non-verbal but pt did say "hi" to her daughter (when pt's daughter entered room)      Exercises General Exercises - Upper Extremity Shoulder Flexion: PROM;Both;10 reps;Supine (to 90 degrees shoulder flexion) Elbow Flexion: PROM;Both;10 reps;Supine Elbow Extension: PROM;Both;10 reps;Supine General Exercises - Lower Extremity Heel Slides: PROM;Both;10 reps;Supine Hip ABduction/ADduction: PROM;Both;10 reps;Supine Other Exercises Other Exercises: 2x30  seconds B heelcord stretching semi-supine in bed    General Comments   Nursing cleared pt for participation in physical therapy.      Pertinent Vitals/Pain Pain Assessment: Faces Faces Pain Scale: No hurt (4/10 with L LE knee flexion PROM) Pain Location: L LE Pain Descriptors / Indicators: Grimacing Pain Intervention(s): Limited activity within patient's tolerance;Monitored during session;Repositioned  HR 103-126 bpm in a-fib rhythm during session.    Home Living                      Prior Function            PT Goals (current goals can now be found in the care plan section) Acute Rehab PT Goals Patient Stated Goal: pt unable to verbalize goals PT Goal Formulation: Patient unable to participate in goal setting Time For Goal Achievement: 09/04/19 Potential to Achieve Goals: Poor Additional Goals Additional Goal #1: Assess mobility once appropriate and update functional mobility goals at that time. Progress towards PT goals: Progressing toward goals    Frequency    Min 2X/week      PT Plan Current plan remains appropriate    Co-evaluation              AM-PAC PT "6 Clicks" Mobility   Outcome Measure  Help needed turning from your back to your side while in a flat bed without using bedrails?: Total Help needed moving from lying on your back to sitting on the side of a flat bed without using bedrails?: Total Help needed moving to and from a bed to a chair (including a wheelchair)?: Total Help needed standing up from a chair using your arms (e.g., wheelchair or bedside chair)?: Total Help needed to walk in hospital room?: Total Help needed climbing 3-5 steps with a railing? : Total 6 Click Score: 6    End of Session Equipment Utilized During Treatment: Oxygen Activity Tolerance: Patient tolerated treatment well Patient left: in bed;with call bell/phone within reach;with bed alarm set;with family/visitor present;Other (comment) (B heels floating via  pillow support) Nurse Communication: Mobility status;Precautions PT Visit Diagnosis: Muscle weakness (generalized) (M62.81);Other abnormalities of gait and mobility (R26.89);Adult, failure to thrive (R62.7)     Time: 5456-2563 PT Time Calculation (min) (ACUTE ONLY): 24 min  Charges:  $Therapeutic Exercise: 23-37 mins                    Hendricks Limes, PT 09/01/19, 3:30 PM

## 2019-09-01 NOTE — NC FL2 (Signed)
[place] LEVEL OF CARE SCREENING TOOL     IDENTIFICATION  Patient Name: Melanie Lewis Birthdate: 04-25-1934 Sex: female Admission Date (Current Location): 07/28/2019  [place] and [place] Number:      Facility and Address:  [facility], [address] [place], [place] [address]      Provider Number: [id number]  Attending Physician Name and Address:  Jennye Boroughs, MD  Relative Name and Phone Number:  Renea Ee    Current Level of Care: SNF Recommended Level of Care: [place] Prior Approval Number:    Date Approved/Denied:   PASRR Number: [id number]  Discharge Plan: SNF    Current Diagnoses: Patient Active Problem List   Diagnosis Date Noted  . Acute parotitis 08/30/2019  . Atrial fibrillation with RVR ([place]) 08/30/2019  . Dysphagia 08/30/2019  . Angioedema 07/28/2019  . Hyperthyroidism 04/20/2017  . Coronary artery disease of native artery of native heart with stable angina pectoris ([place]) 04/20/2017  . Pure hypercholesterolemia 04/20/2017  . Dementia without behavioral disturbance ([place]) 04/20/2017  . Osteopenia of lumbar spine 04/20/2017  . History of non-ST elevation myocardial infarction (NSTEMI) 08/14/2014  . Chronic diastolic heart failure ([place]) 06/29/2014  . Paroxysmal supraventricular tachycardia ([place]) 06/23/2014  . Essential hypertension 04/19/2014  . Bradycardia 04/19/2014    Orientation RESPIRATION BLADDER Height & Weight     Self (patient non verbal)  Normal Indwelling catheter Weight: 84.2 kg Height:  5' 2"  (157.5 cm)  BEHAVIORAL SYMPTOMS/MOOD NEUROLOGICAL BOWEL NUTRITION STATUS      Incontinent Feeding tube (PEG tube just placed)  AMBULATORY STATUS COMMUNICATION OF NEEDS Skin   Extensive Assist  (expressive aphasia) Normal                       Personal Care Assistance Level of Assistance  Total care       Total Care Assistance: Maximum assistance   Functional  Limitations Info  Sight, Hearing, Speech Sight Info: Adequate Hearing Info: Adequate Speech Info: Adequate (expressive aphasia)    SPECIAL CARE FACTORS FREQUENCY  PT (By licensed PT), OT (By licensed OT)                    Contractures Contractures Info: Not present    Additional Factors Info  Code Status, Allergies Code Status Info: Partial Allergies Info: Lisinopril           Current Medications (09/01/2019):  This is the current hospital active medication list Current Facility-Administered Medications  Medication Dose Route Frequency Provider Last Rate Last Admin  . 0.9 %  sodium chloride infusion  250 mL Intravenous PRN Flora Lipps, MD 5 mL/hr at 08/10/19 1547 250 mL at 08/10/19 1547  . Ampicillin-Sulbactam (UNASYN) 3 g in sodium chloride 0.9 % 100 mL IVPB  3 g Intravenous Q6H Sidney Ace, MD   Stopped at 09/01/19 0158  . ceFAZolin (ANCEF) 2-4 GM/100ML-% IVPB           . chlorhexidine gluconate (MEDLINE KIT) (PERIDEX) 0.12 % solution 15 mL  15 mL Mouth Rinse BID Flora Lipps, MD   15 mL at 08/31/19 2109  . Chlorhexidine Gluconate Cloth 2 % PADS 6 each  6 each Topical Daily Tukov-Yual, Magdalene S, NP   6 each at 09/01/19 [phone]  . dextrose 5 % with KCl 20 mEq / L  infusion  20 mEq Intravenous Continuous Jennye Boroughs, MD 75 mL/hr at 09/01/19 0500 Rate Verify at 09/01/19 0500  .  digoxin (LANOXIN) 0.25 MG/ML injection 0.125 mg  0.125 mg Intravenous Once Jennye Boroughs, MD      . diltiazem (CARDIZEM) injection 5 mg  5 mg Intravenous Q6H Jennye Boroughs, MD   5 mg at 09/01/19 0603  . [START ON 09/02/2019] enoxaparin (LOVENOX) injection 40 mg  40 mg Subcutaneous Q24H Candiss Norse A, PA-C      . [START ON 09/02/2019] feeding supplement (OSMOLITE 1.5 CAL) liquid 1,000 mL  1,000 mL Per Tube Continuous Jennye Boroughs, MD      . Derrill Memo ON 09/02/2019] feeding supplement (PROSource TF) liquid 45 mL  45 mL Per Tube Daily Jennye Boroughs, MD      . fentaNYL (SUBLIMAZE) 100  MCG/2ML injection           . [START ON 09/02/2019] free water 120 mL  120 mL Per Tube Q4H Jennye Boroughs, MD      . hydrALAZINE (APRESOLINE) injection 10 mg  10 mg Intravenous Q4H PRN Tukov-Yual, Magdalene S, NP   10 mg at 08/27/19 2211  . insulin aspart (novoLOG) injection 0-20 Units  0-20 Units Subcutaneous Q4H Flora Lipps, MD   3 Units at 08/31/19 2107  . iodixanol (VISIPAQUE) 320 MG/ML injection 50 mL  50 mL Other Once PRN Aletta Edouard, MD      . midazolam (VERSED) 2 MG/2ML injection           . sodium chloride flush (NS) 0.9 % injection 3 mL  3 mL Intravenous Q12H Flora Lipps, MD   3 mL at 08/31/19 2111  . sodium chloride flush (NS) 0.9 % injection 3 mL  3 mL Intravenous PRN Flora Lipps, MD   3 mL at 08/19/19 1022     Discharge Medications: Please see discharge summary for a list of discharge medications.  Relevant Imaging Results:  Relevant Lab Results:   Additional Information SS# [id number]  Shelbie Ammons, RN

## 2019-09-01 NOTE — Plan of Care (Signed)
  Problem: Health Behavior/Discharge Planning: Goal: Ability to manage health-related needs will improve Outcome: Progressing   Problem: Clinical Measurements: Goal: Ability to maintain clinical measurements within normal limits will improve Outcome: Progressing Goal: Will remain free from infection Outcome: Progressing Goal: Diagnostic test results will improve Outcome: Progressing Goal: Respiratory complications will improve Outcome: Progressing Goal: Cardiovascular complication will be avoided Outcome: Progressing   Problem: Clinical Measurements: Goal: Will remain free from infection Outcome: Progressing   Problem: Activity: Goal: Risk for activity intolerance will decrease Outcome: Progressing   Problem: Nutrition: Goal: Adequate nutrition will be maintained Outcome: Progressing   Problem: Coping: Goal: Level of anxiety will decrease Outcome: Progressing   Problem: Elimination: Goal: Will not experience complications related to bowel motility Outcome: Progressing Goal: Will not experience complications related to urinary retention Outcome: Progressing   Problem: Safety: Goal: Ability to remain free from injury will improve Outcome: Progressing  Reviewed poc with pt family member at bedside - understanding voiced

## 2019-09-01 NOTE — Procedures (Signed)
Interventional Radiology Procedure Note  Procedure: Percutaneous gastrostomy tube placement  Complications: None  Estimated Blood Loss: < 10 mL  Findings: 20 Fr bumper retention gastrostomy tube placed with tip in body of stomach. OK to use in 24 hours.  [place] Nellums T. Aniela Caniglia, M.D Pager:  [phone]   

## 2019-09-01 NOTE — TOC Progression Note (Addendum)
Transition of Care [facility]) - Progression Note    Patient Details  Name: RASHELLE IRELAND MRN: [id number] Date of Birth: 02-13-1934  [facility] CM/SW Contact  Shelbie Ammons, RN Phone Number: 09/01/2019, 3:10 PM  Clinical Narrative:   RNCM met with daughter at bedside. Patient had PEG tube placement today. Discussed option of starting search for skilled nursing facility for which daughter is agreeable. Daughter reports that they are interested in [facility] but that she is ok with patient's information being sent out to all area facilities. RNCM completed PASSR, FL-2 and initiated bed search. RNCM will continue to follow.     Expected Discharge Plan: [place] Barriers to Discharge: Continued Medical Work up  Expected Discharge Plan and Services Expected Discharge Plan: [place] arrangements for the past 2 months: Single Family Home                                       Social Determinants of Health (SDOH) Interventions    Readmission Risk Interventions No flowsheet data found.

## 2019-09-02 LAB — MAGNESIUM: Magnesium: 2.6 mg/dL — ABNORMAL HIGH (ref 1.7–2.4)

## 2019-09-02 LAB — RENAL FUNCTION PANEL
Albumin: 2 g/dL — ABNORMAL LOW (ref 3.5–5.0)
Anion gap: 12 (ref 5–15)
BUN: 19 mg/dL (ref 8–23)
CO2: 26 mmol/L (ref 22–32)
Calcium: 8.3 mg/dL — ABNORMAL LOW (ref 8.9–10.3)
Chloride: 106 mmol/L (ref 98–111)
Creatinine, Ser: 0.63 mg/dL (ref 0.44–1.00)
GFR calc Af Amer: >60 mL/min (ref >60)
GFR calc non Af Amer: >60 mL/min (ref >60)
Glucose, Bld: 146 mg/dL — ABNORMAL HIGH (ref 70–99)
Phosphorus: 2.5 mg/dL (ref 2.5–4.6)
Potassium: 4.1 mmol/L (ref 3.5–5.1)
Sodium: 144 mmol/L (ref 135–145)

## 2019-09-02 LAB — CBC WITH DIFFERENTIAL/PLATELET
Abs Immature Granulocytes: 0.22 10*3/uL — ABNORMAL HIGH (ref 0.00–0.07)
Basophils Absolute: 0 10*3/uL (ref 0.0–0.1)
Basophils Relative: 1 %
Eosinophils Absolute: 0 10*3/uL (ref 0.0–0.5)
Eosinophils Relative: 0 %
HCT: 36.4 % (ref 36.0–46.0)
Hemoglobin: 10.9 g/dL — ABNORMAL LOW (ref 12.0–15.0)
Immature Granulocytes: 3 %
Lymphocytes Relative: 28 %
Lymphs Abs: 2.5 10*3/uL (ref 0.7–4.0)
MCH: 30.1 pg (ref 26.0–34.0)
MCHC: 29.9 g/dL — ABNORMAL LOW (ref 30.0–36.0)
MCV: 100.6 fL — ABNORMAL HIGH (ref 80.0–100.0)
Monocytes Absolute: 1 10*3/uL (ref 0.1–1.0)
Monocytes Relative: 12 %
Neutro Abs: 5 10*3/uL (ref 1.7–7.7)
Neutrophils Relative %: 56 %
Platelets: 234 10*3/uL (ref 150–400)
RBC: 3.62 MIL/uL — ABNORMAL LOW (ref 3.87–5.11)
RDW: 15.1 % (ref 11.5–15.5)
WBC: 8.9 10*3/uL (ref 4.0–10.5)
nRBC: 0.8 % — ABNORMAL HIGH (ref 0.0–0.2)

## 2019-09-02 LAB — CULTURE, BLOOD (ROUTINE X 2)
Culture: NO GROWTH
Culture: NO GROWTH
Special Requests: ADEQUATE
Special Requests: ADEQUATE

## 2019-09-02 LAB — GLUCOSE, CAPILLARY
Glucose-Capillary: 146 mg/dL — ABNORMAL HIGH (ref 70–99)
Glucose-Capillary: 155 mg/dL — ABNORMAL HIGH (ref 70–99)
Glucose-Capillary: 180 mg/dL — ABNORMAL HIGH (ref 70–99)
Glucose-Capillary: 186 mg/dL — ABNORMAL HIGH (ref 70–99)
Glucose-Capillary: 83 mg/dL (ref 70–99)
Glucose-Capillary: 86 mg/dL (ref 70–99)

## 2019-09-02 MED ORDER — HYDRALAZINE HCL 25 MG PO TABS
25.0000 mg | ORAL_TABLET | Freq: Two times a day (BID) | ORAL | Status: DC
  Administered 2019-09-02 – 2019-09-05 (×7): 25 mg
  Filled 2019-09-02 (×7): qty 1

## 2019-09-02 MED ORDER — CARVEDILOL 12.5 MG PO TABS
12.5000 mg | ORAL_TABLET | Freq: Two times a day (BID) | ORAL | Status: DC
  Administered 2019-09-02 – 2019-09-05 (×6): 12.5 mg
  Filled 2019-09-02 (×6): qty 1

## 2019-09-02 MED ORDER — ATORVASTATIN CALCIUM 10 MG PO TABS
10.0000 mg | ORAL_TABLET | Freq: Every day | ORAL | Status: DC
  Administered 2019-09-02 – 2019-09-05 (×4): 10 mg
  Filled 2019-09-02 (×4): qty 1

## 2019-09-02 MED ORDER — AMIODARONE HCL 200 MG PO TABS
100.0000 mg | ORAL_TABLET | Freq: Every day | ORAL | Status: DC
  Administered 2019-09-02 – 2019-09-04 (×3): 100 mg
  Filled 2019-09-02 (×4): qty 1

## 2019-09-02 MED ORDER — ASPIRIN 81 MG PO CHEW
81.0000 mg | CHEWABLE_TABLET | Freq: Every day | ORAL | Status: DC
  Administered 2019-09-02 – 2019-09-05 (×4): 81 mg
  Filled 2019-09-02 (×4): qty 1

## 2019-09-02 MED ORDER — METHIMAZOLE 5 MG PO TABS
2.5000 mg | ORAL_TABLET | Freq: Every day | ORAL | Status: DC
  Administered 2019-09-02 – 2019-09-05 (×4): 2.5 mg
  Filled 2019-09-02 (×5): qty 1

## 2019-09-02 MED ORDER — DONEPEZIL HCL 5 MG PO TABS
10.0000 mg | ORAL_TABLET | Freq: Every day | ORAL | Status: DC
  Administered 2019-09-02 – 2019-09-04 (×3): 10 mg
  Filled 2019-09-02 (×3): qty 2

## 2019-09-02 MED ORDER — MEMANTINE HCL 5 MG PO TABS
5.0000 mg | ORAL_TABLET | Freq: Two times a day (BID) | ORAL | Status: DC
  Administered 2019-09-02 – 2019-09-05 (×7): 5 mg
  Filled 2019-09-02 (×8): qty 1

## 2019-09-02 MED ORDER — AMOXICILLIN-POT CLAVULANATE 875-125 MG PO TABS
1.0000 | ORAL_TABLET | Freq: Two times a day (BID) | ORAL | Status: AC
  Administered 2019-09-02 – 2019-09-04 (×4): 1
  Filled 2019-09-02 (×4): qty 1

## 2019-09-02 MED ORDER — AMOXICILLIN-POT CLAVULANATE 875-125 MG PO TABS: 1.0000 | ORAL_TABLET | Freq: Two times a day (BID) | ORAL | Status: DC

## 2019-09-02 MED ORDER — SERTRALINE HCL 50 MG PO TABS
25.0000 mg | ORAL_TABLET | Freq: Every day | ORAL | Status: DC
  Administered 2019-09-02 – 2019-09-05 (×4): 25 mg
  Filled 2019-09-02 (×3): qty 1

## 2019-09-02 MED ORDER — SOTALOL HCL 80 MG PO TABS
40.0000 mg | ORAL_TABLET | Freq: Two times a day (BID) | ORAL | Status: DC
  Administered 2019-09-02 – 2019-09-05 (×7): 40 mg
  Filled 2019-09-02 (×9): qty 0.5

## 2019-09-02 NOTE — Progress Notes (Signed)
Progress Note    NATASSIA GUTHRIDGE  OZY:[id number] DOB: Dec 20, 1934  DOA: 07/28/2019 PCP: Leonel Ramsay, MD      Brief Narrative:    Medical records reviewed and are as summarized below:  ZEFFIE BICKERT is a 85 y.o. female with history of dementia, paroxysmal SVT, diastolic CHF, CAD, HTN and hypothyroidism admitted to ICU on 07/28/2019 with acute respiratory failure in the setting of severe angioedema from ACE inhibitor's requiring intubation with mechanical ventilation until 08/19/2019.   Hospital course complicated by intermittent A. fib with RVR and acute metabolic encephalopathy. Cardiology and neurology consulted. MRI brain negative for acute intracranial finding. Neurology query "apathic hyperthyroidism". She was a started on Provigil with minimal improvement in her mental status. Palliative medicine involved.  [facility] was [facility] on 08/21/2019.      Assessment/Plan:   Active Problems:   Chronic diastolic heart failure (HCC)   History of non-ST elevation myocardial infarction (NSTEMI)   Dementia without behavioral disturbance (HCC)   Angioedema   Acute parotitis   Atrial fibrillation with RVR (HCC)   Dysphagia   Nutrition Problem: Inadequate oral intake Etiology: inability to eat  Signs/Symptoms: NPO status   Body mass index is 33.95 kg/m.  Diet Order            Diet NPO time specified  Diet effective now                  Acute metabolic encephalopathy Acute parotitis, fever of 102.7 on 08/28/2019 Dementia Severe dysphagia Hypernatremia-resolved Hypokalemia-improved A. fib with RVR-heart rate is still uncontrolled History of proximal SVT Hyperthyroidism-TSH 0.014, free T4 1.28 Hypoalbuminemia Chronic diastolic CHF Hypertension CAD with history of NSTEMI   PLAN  S/p PEG tube placement today, 09/01/2019.  Start enteral nutrition  Resume amiodarone, sotalol for atrial fibrillation Resume  antihypertensives (hydralazine, Coreg) Resume methimazole for hyperthyroidism Resume aspirin for CAD Resume Namenda and Aricept for dementia Discontinue IV Cardizem Change IV Unasyn to Augmentin Monitor BMP. Awaiting placement to SNF       Medications:   . amiodarone  100 mg Per Tube Daily  . amoxicillin-clavulanate  1 tablet Per Tube Q12H  . aspirin  81 mg Per Tube Daily  . atorvastatin  10 mg Per Tube Daily  . carvedilol  12.5 mg Per Tube BID WC  . chlorhexidine gluconate (MEDLINE KIT)  15 mL Mouth Rinse BID  . Chlorhexidine Gluconate Cloth  6 each Topical Daily  . donepezil  10 mg Per Tube QHS  . enoxaparin (LOVENOX) injection  40 mg Subcutaneous Q24H  . feeding supplement (PROSource TF)  45 mL Per Tube Daily  . free water  120 mL Per Tube Q4H  . hydrALAZINE  25 mg Per Tube BID  . insulin aspart  0-20 Units Subcutaneous Q4H  . memantine  5 mg Per Tube BID  . methimazole  2.5 mg Per Tube Daily  . sertraline  25 mg Per Tube Daily  . sodium chloride flush  3 mL Intravenous Q12H  . sotalol  40 mg Per Tube BID   Continuous Infusions: . sodium chloride 250 mL (08/10/19 1547)  . feeding supplement (OSMOLITE 1.5 CAL) 1,000 mL (09/02/19 1347)     Anti-infectives (From admission, onward)   Start     Dose/Rate Route Frequency Ordered Stop   09/02/19 2200  amoxicillin-clavulanate (AUGMENTIN) 875-125 MG per tablet 1 tablet  Status:  Discontinued        1 tablet  Per Tube Every 12 hours 09/02/19 1352 09/02/19 1357   09/02/19 2200  amoxicillin-clavulanate (AUGMENTIN) 875-125 MG per tablet 1 tablet        1 tablet Per Tube Every 12 hours 09/02/19 1357 09/04/19 2159   09/01/19 1000  ceFAZolin (ANCEF) IVPB 2g/100 mL premix        2 g 200 mL/hr over 30 Minutes Intravenous  Once 09/01/19 0955 09/01/19 1206   09/01/19 0950  ceFAZolin (ANCEF) 2-4 GM/100ML-% IVPB       Note to Pharmacy: Forestine Chute   : cabinet override      09/01/19 0950 09/01/19 2159   08/30/19 0900  vancomycin  (VANCOREADY) IVPB 750 mg/150 mL  Status:  Discontinued        750 mg 150 mL/hr over 60 Minutes Intravenous Every 24 hours 08/29/19 1112 08/29/19 1141   08/29/19 1400  Ampicillin-Sulbactam (UNASYN) 3 g in sodium chloride 0.9 % 100 mL IVPB  Status:  Discontinued        3 g 200 mL/hr over 30 Minutes Intravenous Every 6 hours 08/29/19 1141 09/02/19 1352   08/29/19 0900  vancomycin (VANCOREADY) IVPB 1750 mg/350 mL        1,750 mg 175 mL/hr over 120 Minutes Intravenous  Once 08/29/19 0724 08/29/19 1100   08/29/19 0830  ceFEPIme (MAXIPIME) 2 g in sodium chloride 0.9 % 100 mL IVPB  Status:  Discontinued        2 g 200 mL/hr over 30 Minutes Intravenous Every 12 hours 08/29/19 0722 08/29/19 1141   08/28/19 0800  Ampicillin-Sulbactam (UNASYN) 3 g in sodium chloride 0.9 % 100 mL IVPB  Status:  Discontinued        3 g 200 mL/hr over 30 Minutes Intravenous Every 6 hours 08/28/19 0714 08/29/19 0711   08/02/19 1200  Ampicillin-Sulbactam (UNASYN) 3 g in sodium chloride 0.9 % 100 mL IVPB  Status:  Discontinued        3 g 200 mL/hr over 30 Minutes Intravenous Every 6 hours 08/02/19 1014 08/07/19 1003             Family Communication/Anticipated D/C date and plan/Code Status   DVT prophylaxis: enoxaparin (LOVENOX) injection 40 mg Start: 09/02/19 1000 SCDs Start: 07/28/19 1541     Code Status: Partial Code  Family Communication: None Disposition Plan:    Status is: Inpatient  Remains inpatient appropriate because:IV treatments appropriate due to intensity of illness or inability to take PO and Inpatient level of care appropriate due to severity of illness   Dispo: The patient is from: Home              Anticipated d/c is to: SNF              Anticipated d/c date is: 2 days              Patient currently is not medically stable to d/c.           Subjective:   No acute events overnight other than tachycardia on telemetry                        Objective:    Vitals:    09/01/19 1517 09/01/19 2054 09/02/19 0011 09/02/19 0821  BP: (!) 157/81 (!) 145/70 140/82 (!) 171/99  Pulse: (!) 107 70 91 (!) 102  Resp: _0 Temp: (!) 97.5 F (36.4 C) 98.5 F (36.9 C) 98.4 F (36.9 C) 98.3 F (36.8  C)  TempSrc: Oral Oral Oral Oral  SpO2: 100%  100% 100%  Weight:      Height:       No data found.   Intake/Output Summary (Last 24 hours) at 09/02/2019 1357 Last data filed at 09/02/2019 0500 Gross per 24 hour  Intake --  Output 650 ml  Net -650 ml   Filed Weights   08/31/19 0424 09/01/19 0500 09/01/19 0941  Weight: 84.3 kg 84.2 kg 84.2 kg    Exam:  GEN: No acute distress SKIN: Warm and dry EYES: no pallor or icterus ENT: MMM CV: Irregular rate and rhythm, tachycardic PULM: Clear to auscultation bilaterally ABD: soft, ND, NT, +BS CNS: Alert.  Speech is soft and barely audible.  She is confused.  She is unable to move any of her extremities EXT: Mild edema bilateral upper extremities.  No edema lower extremities. GU: Foley catheter in place   Data Reviewed:   I have personally reviewed following labs and imaging studies:  Labs: Labs show the following:   Basic Metabolic Panel: Recent Labs  Lab 08/29/19 0519 08/29/19 0519 08/30/19 0512 08/30/19 0512 08/31/19 0446 08/31/19 0446 09/01/19 0643 09/02/19 0450  NA 145  --  146*  --  149*  --  147* 144  K 4.4   < > 3.3*   < > 4.0   < > 3.6 4.1  CL 98  --  104  --  110  --  110 106  CO2 32  --  30  --  29  --  30 26  GLUCOSE 97  --  124*  --  119*  --  109* 146*  BUN 47*  --  56*  --  49*  --  28* 19  CREATININE 0.76  --  0.83  --  0.81  --  0.73  0.74 0.63  CALCIUM 8.4*  --  8.1*  --  8.1*  --  8.0* 8.3*  MG 2.3  --  2.4  --  2.7*  --  2.6* 2.6*  PHOS 4.5  --  4.5  --  3.1  --  2.5 2.5   < > = values in this interval not displayed.   GFR Estimated Creatinine Clearance: 54.5 mL/min (by C-G formula based on SCr of 0.63 mg/dL). Liver Function Tests: Recent Labs  Lab 08/29/19 0519  08/30/19 0512 08/31/19 0446 09/01/19 0643 09/02/19 0450  ALBUMIN 2.1* 1.9* 1.9* 1.8* 2.0*   No results for input(s): LIPASE, AMYLASE in the last 168 hours. No results for input(s): AMMONIA in the last 168 hours. Coagulation profile Recent Labs  Lab 08/31/19 0446  INR 1.2    CBC: Recent Labs  Lab 08/29/19 0519 08/30/19 0512 08/31/19 0446 09/01/19 0643 09/02/19 0450  WBC 22.5* 17.0* 11.0* 7.6 8.9  NEUTROABS 16.4* 12.9* 7.9* 4.8 5.0  HGB 12.7 11.4* 10.9* 10.5* 10.9*  HCT 39.8 36.7 34.3* 34.0* 36.4  MCV 91.9 95.1 92.7 95.8 100.6*  PLT 189 182 202 205 234   Cardiac Enzymes: No results for input(s): CKTOTAL, CKMB, CKMBINDEX, TROPONINI in the last 168 hours. BNP (last 3 results) No results for input(s): PROBNP in the last 8760 hours. CBG: Recent Labs  Lab 09/01/19 2110 09/02/19 0014 09/02/19 0420 09/02/19 0823 09/02/19 1149  GLUCAP 141* 146* 155* 83 86   D-Dimer: No results for input(s): DDIMER in the last 72 hours. Hgb A1c: No results for input(s): HGBA1C in the last 72 hours. Lipid Profile: No results for input(s): CHOL, HDL,  LDLCALC, TRIG, CHOLHDL, LDLDIRECT in the last 72 hours. Thyroid function studies: No results for input(s): TSH, T4TOTAL, T3FREE, THYROIDAB in the last 72 hours.  Invalid input(s): FREET3 Anemia work up: No results for input(s): VITAMINB12, FOLATE, FERRITIN, TIBC, IRON, RETICCTPCT in the last 72 hours. Sepsis Labs: Recent Labs  Lab 08/28/19 0254 08/29/19 0519 08/30/19 0512 08/31/19 0446 09/01/19 0643 09/02/19 0450  PROCALCITON 0.53  --   --   --   --   --   WBC 15.1*   < > 17.0* 11.0* 7.6 8.9   < > = values in this interval not displayed.    Microbiology Recent Results (from the past 240 hour(s))  CULTURE, BLOOD (ROUTINE X 2) w Reflex to ID Panel     Status: None   Collection Time: 08/28/19  9:29 AM   Specimen: BLOOD  Result Value Ref Range Status   Specimen Description BLOOD BLOOD RIGHT HAND  Final   Special Requests    Final    BOTTLES DRAWN AEROBIC ONLY Blood Culture adequate volume   Culture   Final    NO GROWTH 5 DAYS Performed at [facility], [address] [place], [place] [address]    Report Status 09/02/2019 FINAL  Final  CULTURE, BLOOD (ROUTINE X 2) w Reflex to ID Panel     Status: None   Collection Time: 08/28/19 10:35 AM   Specimen: BLOOD  Result Value Ref Range Status   Specimen Description BLOOD RIGHT HAND  Final   Special Requests   Final    BOTTLES DRAWN AEROBIC AND ANAEROBIC Blood Culture adequate volume   Culture   Final    NO GROWTH 5 DAYS Performed at [facility], [address] [place], [place] [address]    Report Status 09/02/2019 FINAL  Final    Procedures and diagnostic studies:  IR GASTROSTOMY TUBE MOD SED  Result Date: 09/01/2019 CLINICAL DATA:  Respiratory failure and need for percutaneous gastrostomy tube for nutrition. EXAM: PERCUTANEOUS GASTROSTOMY TUBE PLACEMENT ANESTHESIA/SEDATION: 0.5 mg IV Versed; 25 mcg IV Fentanyl. Total Moderate Sedation Time 26 minutes. The patient's level of consciousness and physiologic status were continuously monitored during the procedure by Radiology nursing. CONTRAST:  20 mL Visipaque 320 MEDICATIONS: 2 g IV Ancef. IV antibiotic was administered in an appropriate time interval prior to needle puncture of the skin. FLUOROSCOPY TIME:  5 minutes and 30 seconds.  40.2 mGy. PROCEDURE: The procedure, risks, benefits, and alternatives were explained to the patient's son. Questions regarding the procedure were encouraged and answered. The patient's son understands and consents to the procedure. \A 5-French catheter was then advanced through the patient's mouth under fluoroscopy into the esophagus and to the level of the stomach. This catheter was used to insufflate the stomach with air under fluoroscopy. The abdominal wall was prepped with chlorhexidine in a sterile fashion, and a sterile drape was applied covering the operative  field. A sterile gown and sterile gloves were used for the procedure. Local anesthesia was provided with 1% Lidocaine. A skin incision was made in the upper abdominal wall. Under fluoroscopy, an 18 gauge trocar needle was advanced into the stomach. Contrast injection was performed to confirm intraluminal position of the needle tip. A single T tack was then deployed in the lumen of the stomach. This was brought up to tension at the skin surface. Over a guidewire, a 9-French sheath was advanced into the lumen of the stomach. The wire was left in place as a safety wire.  A loop snare device from a percutaneous gastrostomy kit was then advanced into the stomach. A floppy guide wire was advanced through the orogastric catheter under fluoroscopy in the stomach. The loop snare advanced through the percutaneous gastric access was used to snare the guide wire. This allowed withdrawal of the loop snare out of the patient's mouth by retraction of the orogastric catheter and wire. A 20-French bumper retention gastrostomy tube was looped around the snare device. It was then pulled back through the patient's mouth. The retention bumper was brought up to the anterior gastric wall. The T tack suture was cut at the skin. The exiting gastrostomy tube was cut to appropriate length and a feeding adapter applied. The catheter was injected with contrast material to confirm position and a fluoroscopic spot image saved. The tube was then flushed with saline. A dressing was applied over the gastrostomy exit site. COMPLICATIONS: None. FINDINGS: The stomach distended well with air allowing safe placement of the gastrostomy tube. After placement, the tip of the gastrostomy tube lies in the body of the stomach. IMPRESSION: Percutaneous gastrostomy with placement of a 20-French bumper retention tube in the body of the stomach. This tube can be used for percutaneous feeds beginning in 24 hours after placement. Electronically Signed   By: Aletta Edouard M.D.   On: 09/01/2019 15:56               LOS: 36 days   [facility]   Pager on [email or web address]. If 7PM-7AM, please contact night-coverage at [email or web address]     09/02/2019, 1:57 PM

## 2019-09-03 LAB — RENAL FUNCTION PANEL
Albumin: 1.8 g/dL — ABNORMAL LOW (ref 3.5–5.0)
Anion gap: 8 (ref 5–15)
BUN: 21 mg/dL (ref 8–23)
CO2: 27 mmol/L (ref 22–32)
Calcium: 7.9 mg/dL — ABNORMAL LOW (ref 8.9–10.3)
Chloride: 105 mmol/L (ref 98–111)
Creatinine, Ser: 0.64 mg/dL (ref 0.44–1.00)
GFR calc Af Amer: >60 mL/min (ref >60)
GFR calc non Af Amer: >60 mL/min (ref >60)
Glucose, Bld: 183 mg/dL — ABNORMAL HIGH (ref 70–99)
Phosphorus: 2.1 mg/dL — ABNORMAL LOW (ref 2.5–4.6)
Potassium: 3.5 mmol/L (ref 3.5–5.1)
Sodium: 140 mmol/L (ref 135–145)

## 2019-09-03 LAB — GLUCOSE, CAPILLARY
Glucose-Capillary: 151 mg/dL — ABNORMAL HIGH (ref 70–99)
Glucose-Capillary: 160 mg/dL — ABNORMAL HIGH (ref 70–99)
Glucose-Capillary: 162 mg/dL — ABNORMAL HIGH (ref 70–99)
Glucose-Capillary: 165 mg/dL — ABNORMAL HIGH (ref 70–99)
Glucose-Capillary: 172 mg/dL — ABNORMAL HIGH (ref 70–99)
Glucose-Capillary: 190 mg/dL — ABNORMAL HIGH (ref 70–99)
Glucose-Capillary: 190 mg/dL — ABNORMAL HIGH (ref 70–99)
Glucose-Capillary: 205 mg/dL — ABNORMAL HIGH (ref 70–99)

## 2019-09-03 LAB — CBC WITH DIFFERENTIAL/PLATELET
Abs Immature Granulocytes: 0.43 10*3/uL — ABNORMAL HIGH (ref 0.00–0.07)
Basophils Absolute: 0 10*3/uL (ref 0.0–0.1)
Basophils Relative: 0 %
Eosinophils Absolute: 0 10*3/uL (ref 0.0–0.5)
Eosinophils Relative: 0 %
HCT: 34.1 % — ABNORMAL LOW (ref 36.0–46.0)
Hemoglobin: 10.2 g/dL — ABNORMAL LOW (ref 12.0–15.0)
Immature Granulocytes: 5 %
Lymphocytes Relative: 25 %
Lymphs Abs: 2 10*3/uL (ref 0.7–4.0)
MCH: 29.1 pg (ref 26.0–34.0)
MCHC: 29.9 g/dL — ABNORMAL LOW (ref 30.0–36.0)
MCV: 97.2 fL (ref 80.0–100.0)
Monocytes Absolute: 0.7 10*3/uL (ref 0.1–1.0)
Monocytes Relative: 8 %
Neutro Abs: 5.1 10*3/uL (ref 1.7–7.7)
Neutrophils Relative %: 62 %
Platelets: 228 10*3/uL (ref 150–400)
RBC: 3.51 MIL/uL — ABNORMAL LOW (ref 3.87–5.11)
RDW: 15 % (ref 11.5–15.5)
WBC: 8.3 10*3/uL (ref 4.0–10.5)
nRBC: 0.5 % — ABNORMAL HIGH (ref 0.0–0.2)

## 2019-09-03 LAB — MAGNESIUM: Magnesium: 2.4 mg/dL (ref 1.7–2.4)

## 2019-09-03 NOTE — Progress Notes (Signed)
Pt was stable with rectal tube, foley and PEG tube with continuous tube feeding, on tele ST. Foley was removed per order and MD wants to have a voiding trial. No significant event happened.

## 2019-09-03 NOTE — Progress Notes (Signed)
Progress Note    Melanie Lewis  QZE:[id number] DOB: Dec 03, 1934  DOA: 07/28/2019 PCP: Leonel Ramsay, MD      Brief Narrative:    Medical records reviewed and are as summarized below:  Melanie Lewis is a 85 y.o. female with history of dementia, paroxysmal SVT, diastolic CHF, CAD, HTN and hypothyroidism admitted to ICU on 07/28/2019 with acute respiratory failure in the setting of severe angioedema from ACE inhibitor's requiring intubation with mechanical ventilation until 08/19/2019.   Hospital course complicated by intermittent A. fib with RVR and acute metabolic encephalopathy. Cardiology and neurology consulted. MRI brain negative for acute intracranial finding. Neurology query "apathic hyperthyroidism". She was a started on Provigil with minimal improvement in her mental status. Palliative medicine involved.  Patient was transferred to [facility] on 08/21/2019.      Assessment/Plan:   Active Problems:   Chronic diastolic heart failure (HCC)   History of non-ST elevation myocardial infarction (NSTEMI)   Dementia without behavioral disturbance (HCC)   Angioedema   Acute parotitis   Atrial fibrillation with RVR (HCC)   Dysphagia   Nutrition Problem: Inadequate oral intake Etiology: inability to eat  Signs/Symptoms: NPO status   Body mass index is 33.79 kg/m.  Diet Order            Diet NPO time specified  Diet effective now                  Acute metabolic encephalopathy Acute parotitis, fever of 102.7 on 08/28/2019 Dementia Severe dysphagia Hypernatremia-resolved Hypokalemia-improved A. fib with RVR-heart rate is better. History of proximal SVT Hyperthyroidism-TSH 0.014, free T4 1.28 Hypoalbuminemia Chronic diastolic CHF Hypertension CAD with history of NSTEMI   PLAN  S/p PEG tube placement on 09/03/2019 Continue Augmentin for acute parotitis Continue enteral nutrition Continue antihypertensives and A. fib  medications Continue home medications for comorbidities Monitor BMP. Awaiting placement to SNF Remove Foley catheter and start voiding trial.       Medications:   . amiodarone  100 mg Per Tube Daily  . amoxicillin-clavulanate  1 tablet Per Tube Q12H  . aspirin  81 mg Per Tube Daily  . atorvastatin  10 mg Per Tube Daily  . carvedilol  12.5 mg Per Tube BID WC  . chlorhexidine gluconate (MEDLINE KIT)  15 mL Mouth Rinse BID  . Chlorhexidine Gluconate Cloth  6 each Topical Daily  . donepezil  10 mg Per Tube QHS  . enoxaparin (LOVENOX) injection  40 mg Subcutaneous Q24H  . feeding supplement (PROSource TF)  45 mL Per Tube Daily  . free water  120 mL Per Tube Q4H  . hydrALAZINE  25 mg Per Tube BID  . insulin aspart  0-20 Units Subcutaneous Q4H  . memantine  5 mg Per Tube BID  . methimazole  2.5 mg Per Tube Daily  . sertraline  25 mg Per Tube Daily  . sodium chloride flush  3 mL Intravenous Q12H  . sotalol  40 mg Per Tube BID   Continuous Infusions: . sodium chloride 250 mL (08/10/19 1547)  . feeding supplement (OSMOLITE 1.5 CAL) 1,000 mL (09/02/19 1347)     Anti-infectives (From admission, onward)   Start     Dose/Rate Route Frequency Ordered Stop   09/02/19 2200  amoxicillin-clavulanate (AUGMENTIN) 875-125 MG per tablet 1 tablet  Status:  Discontinued        1 tablet Per Tube Every 12 hours 09/02/19 1352 09/02/19 1357   09/02/19  2200  amoxicillin-clavulanate (AUGMENTIN) 875-125 MG per tablet 1 tablet        1 tablet Per Tube Every 12 hours 09/02/19 1357 09/04/19 2159   09/01/19 1000  ceFAZolin (ANCEF) IVPB 2g/100 mL premix        2 g 200 mL/hr over 30 Minutes Intravenous  Once 09/01/19 0955 09/01/19 1206   09/01/19 0950  ceFAZolin (ANCEF) 2-4 GM/100ML-% IVPB       Note to Pharmacy: Forestine Chute   : cabinet override      09/01/19 0950 09/01/19 2159   08/30/19 0900  vancomycin (VANCOREADY) IVPB 750 mg/150 mL  Status:  Discontinued        750 mg 150 mL/hr over 60 Minutes  Intravenous Every 24 hours 08/29/19 1112 08/29/19 1141   08/29/19 1400  Ampicillin-Sulbactam (UNASYN) 3 g in sodium chloride 0.9 % 100 mL IVPB  Status:  Discontinued        3 g 200 mL/hr over 30 Minutes Intravenous Every 6 hours 08/29/19 1141 09/02/19 1352   08/29/19 0900  vancomycin (VANCOREADY) IVPB 1750 mg/350 mL        1,750 mg 175 mL/hr over 120 Minutes Intravenous  Once 08/29/19 0724 08/29/19 1100   08/29/19 0830  ceFEPIme (MAXIPIME) 2 g in sodium chloride 0.9 % 100 mL IVPB  Status:  Discontinued        2 g 200 mL/hr over 30 Minutes Intravenous Every 12 hours 08/29/19 0722 08/29/19 1141   08/28/19 0800  Ampicillin-Sulbactam (UNASYN) 3 g in sodium chloride 0.9 % 100 mL IVPB  Status:  Discontinued        3 g 200 mL/hr over 30 Minutes Intravenous Every 6 hours 08/28/19 0714 08/29/19 0711   08/02/19 1200  Ampicillin-Sulbactam (UNASYN) 3 g in sodium chloride 0.9 % 100 mL IVPB  Status:  Discontinued        3 g 200 mL/hr over 30 Minutes Intravenous Every 6 hours 08/02/19 1014 08/07/19 1003             Family Communication/Anticipated D/C date and plan/Code Status   DVT prophylaxis: enoxaparin (LOVENOX) injection 40 mg Start: 09/02/19 1000 SCDs Start: 07/28/19 1541     Code Status: Partial Code  Family Communication: None Disposition Plan:    Status is: Inpatient  Remains inpatient appropriate because:IV treatments appropriate due to intensity of illness or inability to take PO and Inpatient level of care appropriate due to severity of illness   Dispo: The patient is from: Home              Anticipated d/c is to: SNF              Anticipated d/c date is: 2 days              Patient currently is not medically stable to d/c.           Subjective:   She is unable to provide any history                        Objective:    Vitals:   09/03/19 0011 09/03/19 0500 09/03/19 0754 09/03/19 0827  BP: 137/62  (!) 145/82 (!) 142/73  Pulse: 67  93 (!) 103  Resp: 16   18   Temp: 99.1 F (37.3 C)  98.6 F (37 C) (!) 97.4 F (36.3 C)  TempSrc: Oral  Oral Oral  SpO2: 100%  100% 100%  Weight:  83.8 kg  Height:       No data found.   Intake/Output Summary (Last 24 hours) at 09/03/2019 1454 Last data filed at 09/03/2019 0830 Gross per 24 hour  Intake --  Output 650 ml  Net -650 ml   Filed Weights   09/01/19 0500 09/01/19 0941 09/03/19 0500  Weight: 84.2 kg 84.2 kg 83.8 kg    Exam:  GEN: No acute distress SKIN: Warm and dry EYES: no pallor or icterus ENT: MMM CV: Regular rate and rhythm PULM: No wheezing or rales. ABD: Soft, nontender CNS: Alert but confused.  She is unable to move her extremities. EXT: Edema of bilateral upper extremities but no edema in lower extremities GU: Foley catheter in place   Data Reviewed:   I have personally reviewed following labs and imaging studies:  Labs: Labs show the following:   Basic Metabolic Panel: Recent Labs  Lab 08/30/19 0512 08/30/19 0512 08/31/19 0446 08/31/19 0446 09/01/19 0643 09/01/19 0643 09/02/19 0450 09/03/19 0434  NA 146*  --  149*  --  147*  --  144 140  K 3.3*   < > 4.0   < > 3.6   < > 4.1 3.5  CL 104  --  110  --  110  --  106 105  CO2 30  --  29  --  30  --  26 27  GLUCOSE 124*  --  119*  --  109*  --  146* 183*  BUN 56*  --  49*  --  28*  --  19 21  CREATININE 0.83  --  0.81  --  0.73  0.74  --  0.63 0.64  CALCIUM 8.1*  --  8.1*  --  8.0*  --  8.3* 7.9*  MG 2.4  --  2.7*  --  2.6*  --  2.6* 2.4  PHOS 4.5  --  3.1  --  2.5  --  2.5 2.1*   < > = values in this interval not displayed.   GFR Estimated Creatinine Clearance: 54.4 mL/min (by C-G formula based on SCr of 0.64 mg/dL). Liver Function Tests: Recent Labs  Lab 08/30/19 0512 08/31/19 0446 09/01/19 0643 09/02/19 0450 09/03/19 0434  ALBUMIN 1.9* 1.9* 1.8* 2.0* 1.8*   No results for input(s): LIPASE, AMYLASE in the last 168 hours. No results for input(s): AMMONIA in the last 168 hours. Coagulation  profile Recent Labs  Lab 08/31/19 0446  INR 1.2    CBC: Recent Labs  Lab 08/30/19 0512 08/31/19 0446 09/01/19 0643 09/02/19 0450 09/03/19 0434  WBC 17.0* 11.0* 7.6 8.9 8.3  NEUTROABS 12.9* 7.9* 4.8 5.0 5.1  HGB 11.4* 10.9* 10.5* 10.9* 10.2*  HCT 36.7 34.3* 34.0* 36.4 34.1*  MCV 95.1 92.7 95.8 100.6* 97.2  PLT 182 202 205 234 228   Cardiac Enzymes: No results for input(s): CKTOTAL, CKMB, CKMBINDEX, TROPONINI in the last 168 hours. BNP (last 3 results) No results for input(s): PROBNP in the last 8760 hours. CBG: Recent Labs  Lab 09/03/19 0011 09/03/19 0447 09/03/19 0753 09/03/19 0827 09/03/19 1219  GLUCAP 205* 190* 160* 151* 172*   D-Dimer: No results for input(s): DDIMER in the last 72 hours. Hgb A1c: No results for input(s): HGBA1C in the last 72 hours. Lipid Profile: No results for input(s): CHOL, HDL, LDLCALC, TRIG, CHOLHDL, LDLDIRECT in the last 72 hours. Thyroid function studies: No results for input(s): TSH, T4TOTAL, T3FREE, THYROIDAB in the last 72 hours.  Invalid input(s): FREET3 Anemia work up: No  results for input(s): VITAMINB12, FOLATE, FERRITIN, TIBC, IRON, RETICCTPCT in the last 72 hours. Sepsis Labs: Recent Labs  Lab 08/28/19 0254 08/29/19 0519 08/31/19 0446 09/01/19 0643 09/02/19 0450 09/03/19 0434  PROCALCITON 0.53  --   --   --   --   --   WBC 15.1*   < > 11.0* 7.6 8.9 8.3   < > = values in this interval not displayed.    Microbiology Recent Results (from the past 240 hour(s))  CULTURE, BLOOD (ROUTINE X 2) w Reflex to ID Panel     Status: None   Collection Time: 08/28/19  9:29 AM   Specimen: BLOOD  Result Value Ref Range Status   Specimen Description BLOOD BLOOD RIGHT HAND  Final   Special Requests   Final    BOTTLES DRAWN AEROBIC ONLY Blood Culture adequate volume   Culture   Final    NO GROWTH 5 DAYS Performed at [facility], [address] [place], [place] [address]    Report Status 09/02/2019 FINAL  Final   CULTURE, BLOOD (ROUTINE X 2) w Reflex to ID Panel     Status: None   Collection Time: 08/28/19 10:35 AM   Specimen: BLOOD  Result Value Ref Range Status   Specimen Description BLOOD RIGHT HAND  Final   Special Requests   Final    BOTTLES DRAWN AEROBIC AND ANAEROBIC Blood Culture adequate volume   Culture   Final    NO GROWTH 5 DAYS Performed at [facility], [address], [place], [place] [address]    Report Status 09/02/2019 FINAL  Final    Procedures and diagnostic studies:  No results found.             LOS: 37 days   [employer], advertising on [email or web address]. If 7PM-7AM, please contact night-coverage at [email or web address]     09/03/2019, 2:54 PM

## 2019-09-04 LAB — CBC WITH DIFFERENTIAL/PLATELET
Abs Immature Granulocytes: 0.58 10*3/uL — ABNORMAL HIGH (ref 0.00–0.07)
Basophils Absolute: 0 10*3/uL (ref 0.0–0.1)
Basophils Relative: 1 %
Eosinophils Absolute: 0 10*3/uL (ref 0.0–0.5)
Eosinophils Relative: 0 %
HCT: 32.6 % — ABNORMAL LOW (ref 36.0–46.0)
Hemoglobin: 9.7 g/dL — ABNORMAL LOW (ref 12.0–15.0)
Immature Granulocytes: 7 %
Lymphocytes Relative: 22 %
Lymphs Abs: 1.9 10*3/uL (ref 0.7–4.0)
MCH: 29.2 pg (ref 26.0–34.0)
MCHC: 29.8 g/dL — ABNORMAL LOW (ref 30.0–36.0)
MCV: 98.2 fL (ref 80.0–100.0)
Monocytes Absolute: 0.6 10*3/uL (ref 0.1–1.0)
Monocytes Relative: 7 %
Neutro Abs: 5.3 10*3/uL (ref 1.7–7.7)
Neutrophils Relative %: 63 %
Platelets: 223 10*3/uL (ref 150–400)
RBC: 3.32 MIL/uL — ABNORMAL LOW (ref 3.87–5.11)
RDW: 15.3 % (ref 11.5–15.5)
Smear Review: NORMAL
WBC: 8.4 10*3/uL (ref 4.0–10.5)
nRBC: 0.5 % — ABNORMAL HIGH (ref 0.0–0.2)

## 2019-09-04 LAB — RENAL FUNCTION PANEL
Albumin: 1.8 g/dL — ABNORMAL LOW (ref 3.5–5.0)
Anion gap: 10 (ref 5–15)
BUN: 23 mg/dL (ref 8–23)
CO2: 25 mmol/L (ref 22–32)
Calcium: 8 mg/dL — ABNORMAL LOW (ref 8.9–10.3)
Chloride: 106 mmol/L (ref 98–111)
Creatinine, Ser: 0.64 mg/dL (ref 0.44–1.00)
GFR calc Af Amer: >60 mL/min (ref >60)
GFR calc non Af Amer: >60 mL/min (ref >60)
Glucose, Bld: 191 mg/dL — ABNORMAL HIGH (ref 70–99)
Phosphorus: 2 mg/dL — ABNORMAL LOW (ref 2.5–4.6)
Potassium: 4.2 mmol/L (ref 3.5–5.1)
Sodium: 141 mmol/L (ref 135–145)

## 2019-09-04 LAB — GLUCOSE, CAPILLARY
Glucose-Capillary: 112 mg/dL — ABNORMAL HIGH (ref 70–99)
Glucose-Capillary: 173 mg/dL — ABNORMAL HIGH (ref 70–99)
Glucose-Capillary: 174 mg/dL — ABNORMAL HIGH (ref 70–99)
Glucose-Capillary: 177 mg/dL — ABNORMAL HIGH (ref 70–99)
Glucose-Capillary: 177 mg/dL — ABNORMAL HIGH (ref 70–99)
Glucose-Capillary: 195 mg/dL — ABNORMAL HIGH (ref 70–99)

## 2019-09-04 LAB — MAGNESIUM: Magnesium: 2.5 mg/dL — ABNORMAL HIGH (ref 1.7–2.4)

## 2019-09-04 MED ORDER — TORSEMIDE 20 MG PO TABS
20.0000 mg | ORAL_TABLET | Freq: Every day | ORAL | Status: DC
  Administered 2019-09-04 – 2019-09-05 (×2): 20 mg via ORAL
  Filled 2019-09-04 (×2): qty 1

## 2019-09-04 MED ORDER — K PHOS MONO-SOD PHOS DI & MONO 155-852-130 MG PO TABS
500.0000 mg | ORAL_TABLET | Freq: Three times a day (TID) | ORAL | Status: AC
  Administered 2019-09-04 (×3): 500 mg via ORAL
  Filled 2019-09-04 (×4): qty 2

## 2019-09-04 NOTE — Progress Notes (Addendum)
Progress Note    Melanie Lewis  EML:[id number] DOB: February 27, 1934  DOA: 07/28/2019 PCP: Leonel Ramsay, MD      Brief Narrative:    Medical records reviewed and are as summarized below:  Melanie Lewis is a 85 y.o. female with history of dementia, paroxysmal SVT, diastolic CHF, CAD, HTN and hypothyroidism admitted to ICU on 07/28/2019 with acute respiratory failure in the setting of severe angioedema from ACE inhibitor's requiring intubation with mechanical ventilation until 08/19/2019.   Hospital course complicated by intermittent A. fib with RVR and acute metabolic encephalopathy. Cardiology and neurology consulted. MRI brain negative for acute intracranial finding. Neurology query "apathic hyperthyroidism". She was a started on Provigil with minimal improvement in her mental status. Palliative medicine involved.  Patient was transferred to [facility]ist service on 08/21/2019.      Assessment/Plan:   Active Problems:   Chronic diastolic heart failure (HCC)   History of non-ST elevation myocardial infarction (NSTEMI)   Dementia without behavioral disturbance (HCC)   Angioedema   Acute parotitis   Atrial fibrillation with RVR (HCC)   Dysphagia   Nutrition Problem: Inadequate oral intake Etiology: inability to eat  Signs/Symptoms: NPO status   Body mass index is 33.79 kg/m.  Diet Order            Diet NPO time specified  Diet effective now                  Acute metabolic encephalopathy Acute parotitis, fever of 102.7 on 08/28/2019 Dementia Severe dysphagia Hypernatremia-resolved Hypokalemia-improved A. fib with RVR-heart rate is better. 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 Continue enteral  nutrition Continue antihypertensives Resume Torsemide Discontinue amiodarone because of suspected amiodarone induced hyperthyroidism Continue methimazole for hyperthyroidism Continue home medications for comorbidities Foley catheter was removed on 09/03/2019.  However, patient was found to have retained urine after bladder scan showed retained urine so in and out urethral catheterization was done today.  Continue voiding trial.  Foley catheter will be replaced if she fails voiding trial. Monitor electrolytes        Medications:   . aspirin  81 mg Per Tube Daily  . atorvastatin  10 mg Per Tube Daily  . carvedilol  12.5 mg Per Tube BID WC  . chlorhexidine gluconate (MEDLINE KIT)  15 mL Mouth Rinse BID  . Chlorhexidine Gluconate Cloth  6 each Topical Daily  . donepezil  10 mg Per Tube QHS  . enoxaparin (LOVENOX) injection  40 mg Subcutaneous Q24H  . feeding supplement (PROSource TF)  45 mL Per Tube Daily  . free water  120 mL Per Tube Q4H  . hydrALAZINE  25 mg Per Tube BID  . insulin aspart  0-20 Units Subcutaneous Q4H  . memantine  5 mg Per Tube BID  . methimazole  2.5 mg Per Tube Daily  . phosphorus  500 mg Oral TID  . sertraline  25 mg Per Tube Daily  . sodium chloride flush  3 mL Intravenous Q12H  . sotalol  40 mg Per Tube BID  . torsemide  20 mg Oral Daily   Continuous Infusions: . sodium chloride 250 mL (08/10/19 1547)  . feeding supplement (OSMOLITE 1.5 CAL) 1,000 mL (09/02/19 1347)     Anti-infectives (From admission, onward)   Start  Dose/Rate Route Frequency Ordered Stop   09/02/19 2200  amoxicillin-clavulanate (AUGMENTIN) 875-125 MG per tablet 1 tablet  Status:  Discontinued        1 tablet Per Tube Every 12 hours 09/02/19 1352 09/02/19 1357   09/02/19 2200  amoxicillin-clavulanate (AUGMENTIN) 875-125 MG per tablet 1 tablet        1 tablet Per Tube Every 12 hours 09/02/19 1357 09/04/19 0801   09/01/19 1000  ceFAZolin (ANCEF) IVPB 2g/100 mL premix        2  g 200 mL/hr over 30 Minutes Intravenous  Once 09/01/19 0955 09/01/19 1206   09/01/19 0950  ceFAZolin (ANCEF) 2-4 GM/100ML-% IVPB       Note to Pharmacy: Forestine Chute   : cabinet override      09/01/19 0950 09/01/19 2159   08/30/19 0900  vancomycin (VANCOREADY) IVPB 750 mg/150 mL  Status:  Discontinued        750 mg 150 mL/hr over 60 Minutes Intravenous Every 24 hours 08/29/19 1112 08/29/19 1141   08/29/19 1400  Ampicillin-Sulbactam (UNASYN) 3 g in sodium chloride 0.9 % 100 mL IVPB  Status:  Discontinued        3 g 200 mL/hr over 30 Minutes Intravenous Every 6 hours 08/29/19 1141 09/02/19 1352   08/29/19 0900  vancomycin (VANCOREADY) IVPB 1750 mg/350 mL        1,750 mg 175 mL/hr over 120 Minutes Intravenous  Once 08/29/19 0724 08/29/19 1100   08/29/19 0830  ceFEPIme (MAXIPIME) 2 g in sodium chloride 0.9 % 100 mL IVPB  Status:  Discontinued        2 g 200 mL/hr over 30 Minutes Intravenous Every 12 hours 08/29/19 0722 08/29/19 1141   08/28/19 0800  Ampicillin-Sulbactam (UNASYN) 3 g in sodium chloride 0.9 % 100 mL IVPB  Status:  Discontinued        3 g 200 mL/hr over 30 Minutes Intravenous Every 6 hours 08/28/19 0714 08/29/19 0711   08/02/19 1200  Ampicillin-Sulbactam (UNASYN) 3 g in sodium chloride 0.9 % 100 mL IVPB  Status:  Discontinued        3 g 200 mL/hr over 30 Minutes Intravenous Every 6 hours 08/02/19 1014 08/07/19 1003             Family Communication/Anticipated D/C date and plan/Code Status   DVT prophylaxis: enoxaparin (LOVENOX) injection 40 mg Start: 09/02/19 1000 SCDs Start: 07/28/19 1541     Code Status: Partial Code  Family Communication: None Disposition Plan:    Status is: Inpatient  Remains inpatient appropriate because:IV treatments appropriate due to intensity of illness or inability to take PO and Inpatient level of care appropriate due to severity of illness   Dispo: The patient is from: Home              Anticipated d/c is to: SNF               Anticipated d/c date is: 2 days              Patient currently is not medically stable to d/c.           Subjective:   She is unable to provide any history                        Objective:    Vitals:   09/03/19 1556 09/04/19 0124 09/04/19 0744 09/04/19 1515  BP: (!) 147/75 (!) 130/57 119/66 125/67  Pulse: 77 66  80 75  Resp:  _0 Temp: 98.4 F (36.9 C) 98.5 F (36.9 C) 98.2 F (36.8 C) (!) 97.4 F (36.3 C)  TempSrc: Oral Oral Oral   SpO2: 100% 100% 100% 100%  Weight:      Height:       No data found.   Intake/Output Summary (Last 24 hours) at 09/04/2019 1648 Last data filed at 09/04/2019 1400 Gross per 24 hour  Intake 2600 ml  Output 600 ml  Net 2000 ml   Filed Weights   09/01/19 0500 09/01/19 0941 09/03/19 0500  Weight: 84.2 kg 84.2 kg 83.8 kg    Exam:  GEN: No acute distress SKIN: Warm and dry EYES: no pallor or icterus ENT: MMM CV: Regular rate and rhythm PULM: No wheezing or rales. ABD: Soft, nontender CNS: Alert but confused.  She is unable to move her extremities. EXT: Edema of bilateral upper extremities but no edema in lower extremities GU: Foley catheter in place   Data Reviewed:   I have personally reviewed following labs and imaging studies:  Labs: Labs show the following:   Basic Metabolic Panel: Recent Labs  Lab 08/31/19 0446 08/31/19 0446 09/01/19 0643 09/01/19 0643 09/02/19 0450 09/02/19 0450 09/03/19 0434 09/04/19 0524  NA 149*  --  147*  --  144  --  140 141  K 4.0   < > 3.6   < > 4.1   < > 3.5 4.2  CL 110  --  110  --  106  --  105 106  CO2 29  --  30  --  26  --  27 25  GLUCOSE 119*  --  109*  --  146*  --  183* 191*  BUN 49*  --  28*  --  19  --  21 23  CREATININE 0.81  --  0.73  0.74  --  0.63  --  0.64 0.64  CALCIUM 8.1*  --  8.0*  --  8.3*  --  7.9* 8.0*  MG 2.7*  --  2.6*  --  2.6*  --  2.4 2.5*  PHOS 3.1  --  2.5  --  2.5  --  2.1* 2.0*   < > = values in this interval not displayed.    GFR Estimated Creatinine Clearance: 54.4 mL/min (by C-G formula based on SCr of 0.64 mg/dL). Liver Function Tests: Recent Labs  Lab 08/31/19 0446 09/01/19 0643 09/02/19 0450 09/03/19 0434 09/04/19 0524  ALBUMIN 1.9* 1.8* 2.0* 1.8* 1.8*   No results for input(s): LIPASE, AMYLASE in the last 168 hours. No results for input(s): AMMONIA in the last 168 hours. Coagulation profile Recent Labs  Lab 08/31/19 0446  INR 1.2    CBC: Recent Labs  Lab 08/31/19 0446 09/01/19 0643 09/02/19 0450 09/03/19 0434 09/04/19 0524  WBC 11.0* 7.6 8.9 8.3 8.4  NEUTROABS 7.9* 4.8 5.0 5.1 5.3  HGB 10.9* 10.5* 10.9* 10.2* 9.7*  HCT 34.3* 34.0* 36.4 34.1* 32.6*  MCV 92.7 95.8 100.6* 97.2 98.2  PLT 202 205 234 228 223   Cardiac Enzymes: No results for input(s): CKTOTAL, CKMB, CKMBINDEX, TROPONINI in the last 168 hours. BNP (last 3 results) No results for input(s): PROBNP in the last 8760 hours. CBG: Recent Labs  Lab 09/03/19 2353 09/04/19 0441 09/04/19 0745 09/04/19 1135 09/04/19 1616  GLUCAP 165* 177* 177* 112* 173*   D-Dimer: No results for input(s): DDIMER in the last 72 hours. Hgb A1c: No results for input(s):  HGBA1C in the last 72 hours. Lipid Profile: No results for input(s): CHOL, HDL, LDLCALC, TRIG, CHOLHDL, LDLDIRECT in the last 72 hours. Thyroid function studies: No results for input(s): TSH, T4TOTAL, T3FREE, THYROIDAB in the last 72 hours.  Invalid input(s): FREET3 Anemia work up: No results for input(s): VITAMINB12, FOLATE, FERRITIN, TIBC, IRON, RETICCTPCT in the last 72 hours. Sepsis Labs: Recent Labs  Lab 09/01/19 0643 09/02/19 0450 09/03/19 0434 09/04/19 0524  WBC 7.6 8.9 8.3 8.4    Microbiology Recent Results (from the past 240 hour(s))  CULTURE, BLOOD (ROUTINE X 2) w Reflex to ID Panel     Status: None   Collection Time: 08/28/19  9:29 AM   Specimen: BLOOD  Result Value Ref Range Status   Specimen Description BLOOD BLOOD RIGHT HAND  Final   Special  Requests   Final    BOTTLES DRAWN AEROBIC ONLY Blood Culture adequate volume   Culture   Final    NO GROWTH 5 DAYS Performed at [facility], [address] [place], [place] [address]    Report Status 09/02/2019 FINAL  Final  CULTURE, BLOOD (ROUTINE X 2) w Reflex to ID Panel     Status: None   Collection Time: 08/28/19 10:35 AM   Specimen: BLOOD  Result Value Ref Range Status   Specimen Description BLOOD RIGHT HAND  Final   Special Requests   Final    BOTTLES DRAWN AEROBIC AND ANAEROBIC Blood Culture adequate volume   Culture   Final    NO GROWTH 5 DAYS Performed at [facility], [address], [place], [place] [address]    Report Status 09/02/2019 FINAL  Final    Procedures and diagnostic studies:  No results found.             LOS: 38 days   Bland Copywriter, advertising on [email or web address]. If 7PM-7AM, please contact night-coverage at [email or web address]     09/04/2019, 4:48 PM

## 2019-09-04 NOTE — TOC Progression Note (Signed)
Transition of Care [facility]) - Progression Note    Patient Details  Name: Melanie Lewis MRN: [id number] Date of Birth: [birth year]  Transition of Care [facility]) CM/SW Contact  Barrie Dunker, RN Phone Number: 09/04/2019, 1:59 PM  Clinical Narrative:   Laqueta Linden at [place] [facility] [place] [facility] [employer] as well as Thayer Ohm at [facility].     Expected Discharge Plan: Skilled Nursing Facility Barriers to Discharge: Continued Medical Work up  Expected Discharge Plan and Services Expected Discharge Plan: Skilled Nursing Facility       Living arrangements for the past 2 months: Single Family Home                                       Social Determinants of Health (SDOH) Interventions    Readmission Risk Interventions No flowsheet data found.

## 2019-09-04 NOTE — Care Management Important Message (Signed)
Important Message  Patient Details  Name: LAQUASHA GROOME MRN: [id number] Date of Birth: [birth year]   Medicare Important Message Given:  Yes     Johnell Comings 09/04/2019, 11:27 AM

## 2019-09-04 NOTE — Progress Notes (Addendum)
Pt has no urine output since the foley was DC yesterdat 09/03/19 @ 4pm, currently on purewick, bladder scan was done 0700 with urine. MD was notified and wants to observe the pt. Bladder scan was done at 1100 with , MD was notified. In and Out catheter was done with output. To monitor further the urine output per MD.

## 2019-09-05 LAB — CBC WITH DIFFERENTIAL/PLATELET
Abs Immature Granulocytes: 0.56 10*3/uL — ABNORMAL HIGH (ref 0.00–0.07)
Basophils Absolute: 0 10*3/uL (ref 0.0–0.1)
Basophils Relative: 0 %
Eosinophils Absolute: 0 10*3/uL (ref 0.0–0.5)
Eosinophils Relative: 0 %
HCT: 30.3 % — ABNORMAL LOW (ref 36.0–46.0)
Hemoglobin: 9.5 g/dL — ABNORMAL LOW (ref 12.0–15.0)
Immature Granulocytes: 7 %
Lymphocytes Relative: 24 %
Lymphs Abs: 1.9 10*3/uL (ref 0.7–4.0)
MCH: 29.6 pg (ref 26.0–34.0)
MCHC: 31.4 g/dL (ref 30.0–36.0)
MCV: 94.4 fL (ref 80.0–100.0)
Monocytes Absolute: 0.5 10*3/uL (ref 0.1–1.0)
Monocytes Relative: 7 %
Neutro Abs: 4.8 10*3/uL (ref 1.7–7.7)
Neutrophils Relative %: 62 %
Platelets: 234 10*3/uL (ref 150–400)
RBC: 3.21 MIL/uL — ABNORMAL LOW (ref 3.87–5.11)
RDW: 15.4 % (ref 11.5–15.5)
Smear Review: NORMAL
WBC: 7.7 10*3/uL (ref 4.0–10.5)
nRBC: 0.4 % — ABNORMAL HIGH (ref 0.0–0.2)

## 2019-09-05 LAB — RENAL FUNCTION PANEL
Albumin: 1.7 g/dL — ABNORMAL LOW (ref 3.5–5.0)
Anion gap: 9 (ref 5–15)
BUN: 24 mg/dL — ABNORMAL HIGH (ref 8–23)
CO2: 29 mmol/L (ref 22–32)
Calcium: 7.9 mg/dL — ABNORMAL LOW (ref 8.9–10.3)
Chloride: 104 mmol/L (ref 98–111)
Creatinine, Ser: 0.67 mg/dL (ref 0.44–1.00)
GFR calc Af Amer: >60 mL/min (ref >60)
GFR calc non Af Amer: >60 mL/min (ref >60)
Glucose, Bld: 190 mg/dL — ABNORMAL HIGH (ref 70–99)
Phosphorus: 3.1 mg/dL (ref 2.5–4.6)
Potassium: 3.8 mmol/L (ref 3.5–5.1)
Sodium: 142 mmol/L (ref 135–145)

## 2019-09-05 LAB — SARS CORONAVIRUS 2 BY RT PCR (HOSPITAL ORDER, PERFORMED IN ~~LOC~~ HOSPITAL LAB): SARS Coronavirus 2: NEGATIVE

## 2019-09-05 LAB — GLUCOSE, CAPILLARY
Glucose-Capillary: 175 mg/dL — ABNORMAL HIGH (ref 70–99)
Glucose-Capillary: 148 mg/dL — ABNORMAL HIGH (ref 70–99)
Glucose-Capillary: 154 mg/dL — ABNORMAL HIGH (ref 70–99)

## 2019-09-05 LAB — MAGNESIUM: Magnesium: 2.3 mg/dL (ref 1.7–2.4)

## 2019-09-05 NOTE — TOC Progression Note (Signed)
Transition of Care [facility]) - Progression Note    Patient Details  Name: Melanie Lewis MRN: [id number] Date of Birth: [birth year]  Transition of Care [facility]) CM/SW Contact  Barrie Dunker, RN Phone Number: 09/05/2019, 12:01 PM  Clinical Narrative:    Sherron Monday with Shantel at Admission in [place] She needs Peg feeding order, I notified the physician, DC packet is on the chart and the nurse is aware, awaiting a covid test   Expected Discharge Plan: Skilled Nursing Facility Barriers to Discharge: Continued Medical Work up  Expected Discharge Plan and Services Expected Discharge Plan: Skilled Nursing Facility       Living arrangements for the past 2 months: Single Family Home                                       Social Determinants of Health (SDOH) Interventions    Readmission Risk Interventions No flowsheet data found.

## 2019-09-05 NOTE — Evaluation (Signed)
Physical Therapy Re-Evaluation Patient Details Name: Melanie Lewis MRN: [id number] DOB: 1934-02-09 Today's Date: 09/05/2019   History of Present Illness  Pt is an 85 y.o. female presenting to hospital 7/16 with lip swelling, tongue swelling, and some neck fullness; pt with SOB and difficulty breathing; intubated in ED emergently.  Pt admitted with severe acute hypoxic and hypercapnic respiratory failure from acute angioedema leading to severe airway obstruction; also AKI/renal failure.  During hospitalization pt found to have a-fib with RVR; pt also noted with metabolic encephalopathy.  S/p PEG tube placement 8/22.  PMH includes htn, CAD, CHF, dementia, heart attack, MI, bradycardia, PSVT.  Clinical Impression  PT re-evaluation performed d/t extended hospitalization.  Prior to hospital admission, pt was independent with functional mobility.  Pt awake upon PT entering pt's room and pt talking with therapist during session.  Pt reporting L knee pain with L knee/L LE movement during session (pt's daughter reports pt with h/o L knee pain).  Significant B UE and B LE weakness noted.  Max to total assist x2 semi-supine to/from sitting edge of bed.  Pt able to sit on edge of bed x6 minutes with min to mod assist x1 (SBA of 2nd for safety); 2x's pt able to sit with close SBA for approximately 10 seconds each time.  Decreased activity tolerance noted with activity.  Pt would benefit from skilled PT to address noted impairments and functional limitations (see below for any additional details).  Upon hospital discharge, pt would benefit from STR.  D/t pt's improvements, PT POC reviewed and updated.    Follow Up Recommendations SNF    Equipment Recommendations  Other (comment) (TBD at next facility)    Recommendations for Other Services OT consult     Precautions / Restrictions Precautions Precautions: Fall Precaution Comments: NPO; rectal tube; PEG tube; HOB >30 degrees Restrictions Weight Bearing  Restrictions: No Other Position/Activity Restrictions: Monitor HR      Mobility  Bed Mobility Overal bed mobility: Needs Assistance Bed Mobility: Supine to Sit;Sit to Supine     Supine to sit: Max assist;Total assist;+2 for physical assistance;HOB elevated Sit to supine: Max assist;Total assist;+2 for physical assistance;HOB elevated   General bed mobility comments: assist for trunk and B LE's semi-supine to/from sitting edge of bed  Transfers                 General transfer comment: Deferred d/t significant B UE and B LE weakness  Ambulation/Gait             General Gait Details: Deferred d/t significant B UE and B LE weakness  Stairs            Wheelchair Mobility    Modified Rankin (Stroke Patients Only)       Balance Overall balance assessment: Needs assistance Sitting-balance support: No upper extremity supported;Feet supported (pt not assisting with UE's for sitting balance (pt just resting B UE's onto bed)) Sitting balance-Leahy Scale: Poor Sitting balance - Comments: pt able to sit on edge of bed for 6 minutes with min to mod assist x1 plus SBA of 2nd for safety; 10 seconds x2 pt able to briefly sit on edge of bed with close SBA of 2 (but then pt would start leaning to L side requiring assist for balance and upright posture); pt only able to briefly hold her head up (when therapist assisted pt in positioning) Postural control: Posterior lean  Pertinent Vitals/Pain Pain Assessment: Faces Faces Pain Scale: No hurt (0/10 at rest; 6/10 with L knee movement (pt's daughter reports pt with h/o L knee pain)) Pain Location: L LE Pain Descriptors / Indicators: Grimacing Pain Intervention(s): Limited activity within patient's tolerance;Monitored during session;Repositioned  Vitals (HR and O2 on supplemental O2 via nasal cannula) stable and WFL throughout treatment session.    Home Living Family/patient  expects to be discharged to:: Skilled nursing facility Living Arrangements: Children (pt's daughter) Available Help at Discharge: Family;Available PRN/intermittently (daughter works) Type of Home: House Home Access: Level entry     Home Layout: One level Home Equipment: Cane - single point;Shower seat;Bedside commode      Prior Function Level of Independence: Independent         Comments: Pt occasionally using SPC.  No falls in past 6 months.  Pt able to cook and clean; managed medications herself.     Hand Dominance        Extremity/Trunk Assessment   Upper Extremity Assessment Upper Extremity Assessment: Defer to OT evaluation (weak B hand grip strength; 2-/5 B elbow flexion/extension and shoulder flexion)    Lower Extremity Assessment Lower Extremity Assessment: Generalized weakness (DF/PF B at least 2-/5, B knee flexion/extension at least 2-5, and hip flexion B at least 2-/5)    Cervical / Trunk Assessment Cervical / Trunk Assessment: Other exceptions (forward head/shoulders)  Communication   Communication:  (soft voice; mild increased time to verbalize)  Cognition Arousal/Alertness: Awake/alert Behavior During Therapy: Flat affect (able to smile briefly 2x's when cued) Overall Cognitive Status: Impaired/Different from baseline Area of Impairment: Attention;Following commands;Awareness;Problem solving;Orientation                 Orientation Level: Disoriented to;Time;Situation Current Attention Level: Selective   Following Commands: Follows one step commands inconsistently Safety/Judgement: Decreased awareness of deficits Awareness: Anticipatory Problem Solving: Slow processing;Decreased initiation;Difficulty sequencing;Requires verbal cues;Requires tactile cues General Comments: Pt talking during session with softer voice      General Comments General comments (skin integrity, edema, etc.): PEG tube site clean and intact beginning/end of session.   Nursing cleared pt for participation in physical therapy (nurse held PEG tube feeding for therapy session).  Pt agreeable to PT session.  Pt's eldest daughter present during session.    Exercises Total Joint Exercises Heel Slides: AAROM;Strengthening;Both;10 reps;Supine   Assessment/Plan    PT Assessment Patient needs continued PT services  PT Problem List Decreased strength;Decreased activity tolerance;Decreased balance;Decreased range of motion;Decreased mobility;Decreased cognition;Decreased knowledge of use of DME;Decreased knowledge of precautions       PT Treatment Interventions DME instruction;Gait training;Stair training;Functional mobility training;Therapeutic activities;Therapeutic exercise;Balance training;Patient/family education    PT Goals (Current goals can be found in the Care Plan section)  Acute Rehab PT Goals Patient Stated Goal: to improve strength and mobility PT Goal Formulation: With patient/family Time For Goal Achievement: 09/19/19 Potential to Achieve Goals: Fair    Frequency Min 2X/week   Barriers to discharge Decreased caregiver support      Co-evaluation               AM-PAC PT "6 Clicks" Mobility  Outcome Measure Help needed turning from your back to your side while in a flat bed without using bedrails?: Total Help needed moving from lying on your back to sitting on the side of a flat bed without using bedrails?: Total Help needed moving to and from a bed to a chair (including a wheelchair)?: Total Help needed standing up from  a chair using your arms (e.g., wheelchair or bedside chair)?: Total Help needed to walk in hospital room?: Total Help needed climbing 3-5 steps with a railing? : Total 6 Click Score: 6    End of Session Equipment Utilized During Treatment: Oxygen Activity Tolerance: Patient limited by fatigue Patient left: in bed;with call bell/phone within reach;with bed alarm set;with nursing/sitter in room;with family/visitor  present;with SCD's reapplied;Other (comment) (B heels floating via pillow support; B UE's elevated on pillows) Nurse Communication: Mobility status;Precautions PT Visit Diagnosis: Other abnormalities of gait and mobility (R26.89);Muscle weakness (generalized) (M62.81);Difficulty in walking, not elsewhere classified (R26.2)    Time: 1025-1100 PT Time Calculation (min) (ACUTE ONLY): 35 min   Charges:   PT Evaluation $PT Re-evaluation: 1 Re-eval PT Treatments $Therapeutic Exercise: 8-22 mins       Hendricks Limes, PT 09/05/19, 11:43 AM

## 2019-09-05 NOTE — TOC Progression Note (Signed)
Transition of Care [facility]) - Progression Note    Patient Details  Name: Melanie Lewis MRN: [id number] Date of Birth: Jan 02, 1935  Transition of Care [facility]) CM/SW Contact  Barrie Dunker, RN Phone Number: 09/05/2019, 12:56 PM  Clinical Narrative:    Alphonzo [place] and gave the Peg tube orders per VM  Expected Discharge Plan: Skilled Nursing Facility Barriers to Discharge: Continued Medical Work up  Expected Discharge Plan and Services Expected Discharge Plan: Skilled Nursing Facility       Living arrangements for the past 2 months: Single Family Home Expected Discharge Date: 09/05/19                                     Social Determinants of Health (SDOH) Interventions    Readmission Risk Interventions No flowsheet data found.

## 2019-09-05 NOTE — TOC Progression Note (Addendum)
Transition of Care [facility]) - Progression Note    Patient Details  Name: Melanie Lewis MRN: [id number] Date of Birth: October 06, 1934  [facility] of Care [facility]) CM/SW Contact  Barrie Dunker, RN Phone Number: 09/05/2019, 11:10 AM  Clinical Narrative:   Had discussion with the patients daughter and reviewed the bed offers, she chose a bed at [facility] in [place], I called and left a VM for Shantel in Admissions and provided my contact information, I called [employer] to start the insurance auth process, faxed the clinical notes to [employer] at [phone]   Expected Discharge Plan: Skilled Nursing Facility Barriers to Discharge: Continued Medical Work up  Expected Discharge Plan and Services Expected Discharge Plan: Skilled Nursing Facility       Living arrangements for the past 2 months: Single Family Home                                       Social Determinants of Health (SDOH) Interventions    Readmission Risk Interventions No flowsheet data found.

## 2019-09-05 NOTE — Progress Notes (Signed)
Patient is stable and ready for discharge going [facility] in [place]. Patient's IVs removed and flexiseal removed. Writer called report and spoke with Marchelle Folks, RN at [facility] and she verbalized understanding of report provided and had no questions, Clinical research associate call back number provided. Patient's daughter at bedside and taking patient's belongings with her. Patient is transporting via EMS.

## 2019-09-05 NOTE — TOC Progression Note (Signed)
Transition of Care [facility]) - Progression Note    Patient Details  Name: [name] MRN: [id number] Date of Birth: 23-Aug-1934  Transition of [facility]) CM/SW Contact  Barrie Dunker, RN Phone Number: 09/05/2019, 8:38 AM  Clinical Narrative:   Spoke to the patients daughter and explained that there is only one bed offer and it is [facility], She stated that they can't go there and she would like to go to [facility] instead. I sent the bed search to [facility]    Expected Discharge Plan: Skilled Nursing Facility Barriers to Discharge: Continued Medical Work up  Expected Discharge Plan and Services Expected Discharge Plan: Skilled Nursing Facility       Living arrangements for the past 2 months: Single Family Home                                       Social Determinants of Health (SDOH) Interventions    Readmission Risk Interventions No flowsheet data found.

## 2019-09-05 NOTE — Discharge Summary (Addendum)
Physician Discharge Summary  Melanie Lewis:[id number] DOB: August 23, 1934 DOA: 07/28/2019  PCP: Mick Sell, MD  Admit date: 07/28/2019 Discharge date: 09/05/2019  Discharge disposition: Skilled nursing facility   Recommendations for Outpatient Follow-Up:   Follow-up with physician at the nursing home within 3 days of discharge   Discharge Diagnosis:   Active Problems:   Chronic diastolic heart failure (HCC)   History of non-ST elevation myocardial infarction (NSTEMI)   Dementia without behavioral disturbance (HCC)   Angioedema   Acute parotitis   Atrial fibrillation with RVR (HCC)   Dysphagia    Discharge Condition: Stable.  Diet recommendation:  Diet Order            Osmolite 1.5 Cal  1,032ml, per PEG tube, at 60 mls per hour.  Free water 120 mls per PEG tube every 4 hours              Code Status: Partial Code     Hospital Course:   Ms. Melanie Lewis is a 85 y.o. female with history of dementia, paroxysmal SVT, chronic diastolic CHF, CAD, HTN and hyperthyroidism was admitted to ICU on 07/28/2019 with acute respiratory failure in the setting of severe angioedema from ACE inhibitor's requiring intubation with mechanical ventilation until 08/19/2019.   Hospital course complicated by intermittent A. fib with RVR and acute metabolic encephalopathy. MRI brain negative for acute intracranial finding. Neurology query "apathetic hyperthyroidism". 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  She was a started on Provigil with minimal improvement in her mental status.  She was given IV fluids for dehydration, hypernatremia and acute kidney injury. Palliative medicine involved.  Patient was transferred to [facility] service on 08/21/2019.  Patient required nasogastric tube for enteral nutrition and medications because of severe dysphagia and hypoalbuminemia.  Ultimately,  interventional radiologist was consulted for PEG tube placement on 09/01/2019 because of severe dysphagia.  She also developed atrial fibrillation with rapid regular response and required IV Cardizem and IV digoxin for rate control.  She had hypokalemia that was repleted.  She developed acute parotitis which was treated with IV Unasyn and was subsequently switched to Augmentin.  She developed urinary retention and required Foley catheter.  Patient was able to pass urine successfully after removal of Foley catheter.  She was evaluated by PT and OT who recommended further rehabilitation in a skilled nursing facility.  She is deemed stable for discharge to SNF today.  I called her daughter, [name], to give an update but there was no response.   Medical Consultants:    Cardiologist  Neurologist  Interventional radiologist  Nephrologist   Discharge Exam:    Vitals:   09/04/19 1515 09/04/19 2336 09/05/19 0500 09/05/19 0810  BP: 125/67 121/60  134/61  Pulse: 75 85  63  Resp: 18 18  16   Temp: (!) 97.4 F (36.3 C) 98.2 F (36.8 C)  98 F (36.7 C)  TempSrc:  Oral  Oral  SpO2: 100% 100%  100%  Weight:   91.4 kg   Height:         GEN: NAD SKIN: Warm and dry EYES: No pallor or icterus ENT: MMM CV: RRR PULM: CTA B ABD: soft, ND, NT, +BS CNS: Alert and oriented to person.  She is able to state her first name.  Speech is a little stronger today.  She is able to respond "good morning".  She is unable to move all 4  extremities EXT: Edema of bilateral upper extremities has improved.  No edema in lower extremities.  No tenderness.   The results of significant diagnostics from this hospitalization (including imaging, microbiology, ancillary and laboratory) are listed below for reference.     Procedures and Diagnostic Studies:   DG Chest Port 1 View  Result Date: 07/29/2019 CLINICAL DATA:  85 year old female with respiratory failure EXAM: PORTABLE CHEST 1 VIEW COMPARISON:   07/28/2019 FINDINGS: Cardiomediastinal silhouette unchanged in size and contour. Endotracheal tube terminates 3.8 cm above the carina. Gastric tube terminates in the upper abdomen with the side port persisting at the GE junction. No pneumothorax. Coarsened interstitial markings bilaterally, unchanged. No new confluent airspace disease. IMPRESSION: Similar appearance of the chest x-rays with bilateral mild interstitial opacities. Unchanged endotracheal tube. Unchanged gastric tube with the side port terminating near the GE junction. Electronically Signed   By: Gilmer Mor D.O.   On: 07/29/2019 09:13   DG Chest Portable 1 View  Result Date: 07/28/2019 CLINICAL DATA:  Cyst post intubation.  Respiratory failure. EXAM: PORTABLE CHEST 1 VIEW COMPARISON:  08/13/2014 FINDINGS: Endotracheal tube is now present, 5.3 cm above the carina. Nasogastric tube extends into the upper abdomen just beyond the gastroesophageal junction with its proximal side hole positioned at the gastroesophageal junction. The lungs are symmetrically expanded. No pneumothorax or pleural effusion. Trace interstitial pulmonary edema has developed, likely cardiogenic in nature. Mild cardiomegaly is present new since prior examination. IMPRESSION: Endotracheal tube in appropriate position. Nasogastric tube chest within the gastric lumen. Advancement of 5-10 cm may more optimally position the catheter. Mild cardiogenic failure.  New mild cardiomegaly. Electronically Signed   By: Helyn Numbers MD   On: 07/28/2019 15:45   DG Abd Portable 1 View  Result Date: 07/28/2019 CLINICAL DATA:  Post intubation EXAM: PORTABLE ABDOMEN - 1 VIEW COMPARISON:  None. FINDINGS: The bowel gas pattern is normal. Tip the NG tube is seen within the proximal stomach. No acute osseous. IMPRESSION: Tip the NG tube in the proximal stomach. Electronically Signed   By: Jonna Clark M.D.   On: 07/28/2019 15:57     Labs:   Basic Metabolic Panel: Recent Labs  Lab  09/01/19 2595 09/01/19 6387 09/02/19 0450 09/02/19 0450 09/03/19 0434 09/03/19 0434 09/04/19 0524 09/05/19 0552  NA 147*  --  144  --  140  --  141 142  K 3.6   < > 4.1   < > 3.5   < > 4.2 3.8  CL 110  --  106  --  105  --  106 104  CO2 30  --  26  --  27  --  25 29  GLUCOSE 109*  --  146*  --  183*  --  191* 190*  BUN 28*  --  19  --  21  --  23 24*  CREATININE 0.73  0.74  --  0.63  --  0.64  --  0.64 0.67  CALCIUM 8.0*  --  8.3*  --  7.9*  --  8.0* 7.9*  MG 2.6*  --  2.6*  --  2.4  --  2.5* 2.3  PHOS 2.5  --  2.5  --  2.1*  --  2.0* 3.1   < > = values in this interval not displayed.   GFR Estimated Creatinine Clearance: 57 mL/min (by C-G formula based on SCr of 0.67 mg/dL). Liver Function Tests: Recent Labs  Lab 09/01/19 5643 09/02/19 0450 09/03/19 0434 09/04/19 0524  09/05/19 0552  ALBUMIN 1.8* 2.0* 1.8* 1.8* 1.7*   No results for input(s): LIPASE, AMYLASE in the last 168 hours. No results for input(s): AMMONIA in the last 168 hours. Coagulation profile Recent Labs  Lab 08/31/19 0446  INR 1.2    CBC: Recent Labs  Lab 09/01/19 0643 09/02/19 0450 09/03/19 0434 09/04/19 0524 09/05/19 0552  WBC 7.6 8.9 8.3 8.4 7.7  NEUTROABS 4.8 5.0 5.1 5.3 4.8  HGB 10.5* 10.9* 10.2* 9.7* 9.5*  HCT 34.0* 36.4 34.1* 32.6* 30.3*  MCV 95.8 100.6* 97.2 98.2 94.4  PLT 205 234 228 223 234   Cardiac Enzymes: No results for input(s): CKTOTAL, CKMB, CKMBINDEX, TROPONINI in the last 168 hours. BNP: Invalid input(s): POCBNP CBG: Recent Labs  Lab 09/04/19 2116 09/04/19 2331 09/05/19 0419 09/05/19 0812 09/05/19 1158  GLUCAP 195* 174* 175* 148* 154*   D-Dimer No results for input(s): DDIMER in the last 72 hours. Hgb A1c No results for input(s): HGBA1C in the last 72 hours. Lipid Profile No results for input(s): CHOL, HDL, LDLCALC, TRIG, CHOLHDL, LDLDIRECT in the last 72 hours. Thyroid function studies No results for input(s): TSH, T4TOTAL, T3FREE, THYROIDAB in the last  72 hours.  Invalid input(s): FREET3 Anemia work up No results for input(s): VITAMINB12, FOLATE, FERRITIN, TIBC, IRON, RETICCTPCT in the last 72 hours. Microbiology Recent Results (from the past 240 hour(s))  CULTURE, BLOOD (ROUTINE X 2) w Reflex to ID Panel     Status: None   Collection Time: 08/28/19  9:29 AM   Specimen: BLOOD  Result Value Ref Range Status   Specimen Description BLOOD BLOOD RIGHT HAND  Final   Special Requests   Final    BOTTLES DRAWN AEROBIC ONLY Blood Culture adequate volume   Culture   Final    NO GROWTH 5 DAYS Performed at [facility], [address] [place], [place] [address]    Report Status 09/02/2019 FINAL  Final  CULTURE, BLOOD (ROUTINE X 2) w Reflex to ID Panel     Status: None   Collection Time: 08/28/19 10:35 AM   Specimen: BLOOD  Result Value Ref Range Status   Specimen Description BLOOD RIGHT HAND  Final   Special Requests   Final    BOTTLES DRAWN AEROBIC AND ANAEROBIC Blood Culture adequate volume   Culture   Final    NO GROWTH 5 DAYS Performed at [facility], [address] [place], [place] [address]    Report Status 09/02/2019 FINAL  Final     Discharge Instructions:   Discharge Instructions    AMB referral to CHF clinic   Complete by: As directed    Discharge diet:   Complete by: As directed    Osmolite 1.5 Cal 60 cc/h via PEG tube, free with 120 cc every 4 hours   Discharge wound care:   Complete by: As directed    Keep wounds clean and dry   Increase activity slowly   Complete by: As directed      Allergies as of 09/05/2019      Reactions   Lisinopril Swelling   Angioedema       Medication List    STOP taking these medications   amiodarone 200 MG tablet Commonly known as: PACERONE   cloNIDine 0.1 MG tablet Commonly known as: CATAPRES     TAKE these medications   aspirin EC 81 MG tablet Take 81 mg by mouth daily.   atorvastatin 10 MG tablet Commonly known as: LIPITOR Take 1  tablet (10  mg total) by mouth daily.   Biotin 1 MG Caps Take by mouth.   CALCIUM 1000 + D PO Calcium 600 mg + vit d 800 iu- 1 tab daily   carvedilol 12.5 MG tablet Commonly known as: COREG Take 12.5 mg by mouth 2 (two) times daily with a meal.   donepezil 10 MG tablet Commonly known as: ARICEPT Take 1 tablet (10 mg total) by mouth at bedtime.   hydrALAZINE 25 MG tablet Commonly known as: APRESOLINE Take 25 mg by mouth 2 (two) times daily.   memantine 5 MG tablet Commonly known as: NAMENDA Take 5 mg by mouth 2 (two) times daily.   methimazole 5 MG tablet Commonly known as: TAPAZOLE Take 0.5 tablets by mouth daily.   potassium chloride SA 20 MEQ tablet Commonly known as: KLOR-CON Take 1 tablet (20 mg total) by mouth every day What changed:   how much to take  how to take this  when to take this   sertraline 25 MG tablet Commonly known as: Zoloft Take 1 tablet (25 mg total) by mouth daily.   sotalol 80 MG tablet Commonly known as: BETAPACE Take 40 mg by mouth 2 (two) times daily.   torsemide 20 MG tablet Commonly known as: DEMADEX Take 20 mg by mouth 2 (two) times daily.   Vitamin D (Ergocalciferol) 1.25 MG (50000 UNIT) Caps capsule Commonly known as: DRISDOL Take 50,000 Units by mouth once a week.   Vitamin D3 25 MCG (1000 UT) Caps Take by mouth.            Discharge Care Instructions  (From admission, onward)         Start     Ordered   09/05/19 0000  Discharge wound care:       Comments: Keep wounds clean and dry   09/05/19 1234          Contact information for after-discharge care    Destination    [facility] .   Service: Skilled Nursing Contact information: [address] [place] [address] [phone]                   Time coordinating discharge: 36 minutes  Signed:  Lurene Shadow  [facility] 09/05/2019, 12:40 PM   Pager on [email or web address]. If 7PM-7AM, please contact night-coverage at  [email or web address]

## 2019-09-05 NOTE — TOC Progression Note (Signed)
Transition of Care [facility]) - Progression Note    Patient Details  Name: Melanie Lewis MRN: [id number] Date of Birth: Oct 16, 1934  Transition of Care [facility]) CM/SW Contact  Barrie Dunker, RN Phone Number: 09/05/2019, 1:49 PM  Clinical Narrative:     Called EMS for transport to [facility] in [place], The nurse Candace is aware, the patient is on )2cat 2 liters, the daughter is aware of the the DC  Expected Discharge Plan: Skilled Nursing Facility Barriers to Discharge: Continued Medical Work up  Expected Discharge Plan and Services Expected Discharge Plan: Skilled Nursing Facility       Living arrangements for the past 2 months: Single Family Home Expected Discharge Date: 09/05/19                                     Social Determinants of Health (SDOH) Interventions    Readmission Risk Interventions No flowsheet data found.

## 2019-09-05 NOTE — TOC Progression Note (Signed)
Transition of Care [facility]) - Progression Note    Patient Details  Name: Melanie Lewis MRN: [id number] Date of Birth: [birth year]  [facility] CM/SW Contact  Barrie Dunker, RN Phone Number: 09/05/2019, 2:24 PM  Clinical Narrative:    Received a call from [facility] with Auth approval Start date today, next review date 8/26  Arist is the coordinator, Ref number [id number]   Expected Discharge Plan: Skilled Nursing Facility Barriers to Discharge: Continued Medical Work up  Expected Discharge Plan and Services Expected Discharge Plan: Skilled Nursing Facility       Living arrangements for the past 2 months: Single Family Home Expected Discharge Date: 09/05/19                                     Social Determinants of Health (SDOH) Interventions    Readmission Risk Interventions No flowsheet data found.

## 2019-09-12 ENCOUNTER — Encounter (HOSPITAL_COMMUNITY): Payer: Self-pay

## 2019-09-12 ENCOUNTER — Emergency Department (HOSPITAL_COMMUNITY)
Admission: EM | Admit: 2019-09-12 | Discharge: 2019-09-13 | Disposition: A | Discharge status: Home / Self Care | Payer: Medicare Other | Attending: Emergency Medicine | Admitting: Emergency Medicine

## 2019-09-12 ENCOUNTER — Emergency Department (HOSPITAL_COMMUNITY): Payer: Medicare Other

## 2019-09-12 DIAGNOSIS — R600 Localized edema: Secondary | ICD-10-CM | POA: Diagnosis present

## 2019-09-12 DIAGNOSIS — Z79899 Other long term (current) drug therapy: Secondary | ICD-10-CM | POA: Diagnosis not present

## 2019-09-12 DIAGNOSIS — I25118 Atherosclerotic heart disease of native coronary artery with other forms of angina pectoris: Secondary | ICD-10-CM | POA: Insufficient documentation

## 2019-09-12 DIAGNOSIS — Z7982 Long term (current) use of aspirin: Secondary | ICD-10-CM | POA: Insufficient documentation

## 2019-09-12 DIAGNOSIS — R609 Edema, unspecified: Secondary | ICD-10-CM

## 2019-09-12 DIAGNOSIS — I4891 Unspecified atrial fibrillation: Secondary | ICD-10-CM | POA: Insufficient documentation

## 2019-09-12 DIAGNOSIS — I110 Hypertensive heart disease with heart failure: Secondary | ICD-10-CM | POA: Insufficient documentation

## 2019-09-12 DIAGNOSIS — I5032 Chronic diastolic (congestive) heart failure: Secondary | ICD-10-CM | POA: Diagnosis not present

## 2019-09-12 LAB — BASIC METABOLIC PANEL
Anion gap: 11 (ref 5–15)
BUN: 28 mg/dL — ABNORMAL HIGH (ref 8–23)
CO2: 27 mmol/L (ref 22–32)
Calcium: 8.8 mg/dL — ABNORMAL LOW (ref 8.9–10.3)
Chloride: 98 mmol/L (ref 98–111)
Creatinine, Ser: 0.7 mg/dL (ref 0.44–1.00)
GFR calc Af Amer: >60 mL/min (ref >60)
GFR calc non Af Amer: >60 mL/min (ref >60)
Glucose, Bld: 185 mg/dL — ABNORMAL HIGH (ref 70–99)
Potassium: 5 mmol/L (ref 3.5–5.1)
Sodium: 136 mmol/L (ref 135–145)

## 2019-09-12 LAB — TROPONIN I (HIGH SENSITIVITY): Troponin I (High Sensitivity): 30 ng/L — ABNORMAL HIGH (ref <18)

## 2019-09-12 LAB — CBC
HCT: 32.4 % — ABNORMAL LOW (ref 36.0–46.0)
Hemoglobin: 9.8 g/dL — ABNORMAL LOW (ref 12.0–15.0)
MCH: 29 pg (ref 26.0–34.0)
MCHC: 30.2 g/dL (ref 30.0–36.0)
MCV: 95.9 fL (ref 80.0–100.0)
Platelets: 233 10*3/uL (ref 150–400)
RBC: 3.38 MIL/uL — ABNORMAL LOW (ref 3.87–5.11)
RDW: 16.7 % — ABNORMAL HIGH (ref 11.5–15.5)
WBC: 11.5 10*3/uL — ABNORMAL HIGH (ref 4.0–10.5)
nRBC: 0.3 % — ABNORMAL HIGH (ref 0.0–0.2)

## 2019-09-12 LAB — BRAIN NATRIURETIC PEPTIDE: B Natriuretic Peptide: 362.3 pg/mL — ABNORMAL HIGH (ref 0.0–100.0)

## 2019-09-12 MED ORDER — FUROSEMIDE 10 MG/ML IJ SOLN
40.0000 mg | Freq: Once | INTRAMUSCULAR | Status: DC
  Filled 2019-09-12: qty 4

## 2019-09-12 MED ORDER — METOPROLOL TARTRATE 5 MG/5ML IV SOLN: 5.0000 mg | Freq: Once | INTRAVENOUS | Status: DC

## 2019-09-12 NOTE — ED Notes (Signed)
Attempted to start IV x2.  

## 2019-09-12 NOTE — ED Notes (Signed)
Per Renaye Rakers MD, hold lopressor and lasix

## 2019-09-12 NOTE — Discharge Instructions (Addendum)
Kashonda's labs and workup showed some mild worsening of her congestive heart failure.  The labs are included with her discharge instructions.  She was not requiring hospitalization, and was stable on room air. I would recommend increasing her torsemide dosing to 40 mg in the morning and 20 mg in the evening.  Call her cardiologist tomorrow to arrange for a follow up appointment.   Here in the ER, she also had an episode of rapid atrial fibrillation here, with a heart rate of 130.  She spontaneously converted back into normal sinus rhythm.  I suspect she had a similar episode this morning at the rehab clinic, when she felt short of breath.  If she goes back into rapid atrial fibrillation, you can discuss medications with the doctor on call at your facility.  If her blood pressure and oxygenation is stable, she could potentially be given a dose of a beta blocker or calcium channel blocker through her PEG tube.  The on-call doctor can make this decision.  The patient's daughter was updated in the ER.

## 2019-09-12 NOTE — ED Notes (Signed)
Called pt x3 for vitals, no response. 

## 2019-09-12 NOTE — ED Notes (Signed)
[facility] called  

## 2019-09-12 NOTE — ED Triage Notes (Signed)
Pt from [facility] for increased leg edema. Pt a.o sob. Hx of CHF, rales noted upon assessment. Hx of dementia. Bedbound. Pt alert

## 2019-09-12 NOTE — ED Notes (Signed)
Discharge instructions reviewed w/ April at [facility]. She verbalized understanding of discharge papers and instructions. Pt will be returning back to facility via [facility]

## 2019-09-12 NOTE — ED Provider Notes (Signed)
[facility] EMERGENCY DEPARTMENT Provider Note   CSN: [id number] Arrival date & time: 09/12/19  1207     History Chief Complaint  Patient presents with  . Shortness of Breath  . Leg Swelling    Melanie Lewis is a 85 y.o. female w/ pmhx of ace-inhibitor induced angioedema s/p intubation, CHF, NSTEMI, parox A Fib, dysphagia s/p G-tube placement, dementia, presented to emergency department with leg swelling and reported shortness of breath.  Patient presents from [facility] after discharge from the hospital 7 days ago on August 24.  She had recently had a very extensive and complicated hospitalization course from July 16 until August 24 of this year, with ACE inhibitor induced angioedema, status post intubation, complicated by A. fib, acute parotitis, dementia, status post PEG tube placement.  She eats nothing by mouth and gets her medications through her PEG tube.  Today she presents back with concern from [facility] staff for lower extremity leg swelling.  There is no one answers the phone call and attempt to contact Brewing technologist.  The patient's daughter is present at bedside.  She states her mother is currently at her mental baseline state.  She questions perhaps some mild bilateral knee swelling.  Patient herself is pleasantly demented but cannot provide any further history.  She tells me she has no chest pain.  Per her medical record review, the patient's cardiac medications currently include Coreg 12.5 mg twice daily, aspirin 81 mg daily, and torsemide 20 mg BID, and sotalol 40 mg BID.  She also takes hydralazine 25 mg twice a day for HTN.  She is DNR/DNI with a signed form present at bedside.  *  I spoke to [facility] her nurse at [place] who reports patient had "swelling" of her bilateral hands today, symmetrical, and her HR was 143, and she was complaining of SOB. The arm and hand swelling has happened intermittently this week, always  symmterical, and her doctor did give her an extra dose of lasix earlier this week, which had helped.  HPI     Past Medical History:  Diagnosis Date  . CHF (congestive heart failure) (HCC)   . Coronary artery disease   . Dementia (HCC)   . Heart attack (HCC)   . Hypertension   . hyperthyroid   . Hyperthyroidism   . MI (myocardial infarction) [facility])     Patient Active Problem List   Diagnosis Date Noted  . Acute parotitis 08/30/2019  . Atrial fibrillation with RVR (HCC) 08/30/2019  . Dysphagia 08/30/2019  . Angioedema 07/28/2019  . Hyperthyroidism 04/20/2017  . Coronary artery disease of native artery of native heart with stable angina pectoris (HCC) 04/20/2017  . Pure hypercholesterolemia 04/20/2017  . Dementia without behavioral disturbance (HCC) 04/20/2017  . Osteopenia of lumbar spine 04/20/2017  . History of non-ST elevation myocardial infarction (NSTEMI) 08/14/2014  . Chronic diastolic heart failure (HCC) 06/29/2014  . Paroxysmal supraventricular tachycardia (HCC) 06/23/2014  . Essential hypertension 04/19/2014  . Bradycardia 04/19/2014    Past Surgical History:  Procedure Laterality Date  . CARDIAC CATHETERIZATION N/A 08/14/2014   Procedure: Left Heart Cath and Coronary Angiography;  Surgeon: Marcina Millard, MD;  Location: [facility] INVASIVE CV LAB;  Service: Cardiovascular;  Laterality: N/A;  . CHOLECYSTECTOMY    . IR GASTROSTOMY TUBE MOD SED  09/01/2019  . PARTIAL HYSTERECTOMY       OB History   No obstetric history on file.     Family History  Problem Relation  Age of Onset  . Heart attack Sister   . Hypertension Sister   . Dementia Sister   . Heart attack Son   . Dementia Sister     Social History   Tobacco Use  . Smoking status: Never Smoker  . Smokeless tobacco: Never Used  Substance Use Topics  . Alcohol use: Yes    Alcohol/week: 2.0 standard drinks    Types: 2 Shots of liquor per week    Comment: occasional  . Drug use: No    Home  Medications Prior to Admission medications   Medication Sig Start Date End Date Taking? Authorizing Provider  aspirin EC 81 MG tablet Take 81 mg by mouth daily.    [provider]  atorvastatin (LIPITOR) 10 MG tablet Take 1 tablet (10 mg total) by mouth daily. 09/05/18   Galen Manila, NP  Biotin 1 MG CAPS Take by mouth.    [provider]  Calcium Carb-Cholecalciferol (CALCIUM 1000 + D PO) Calcium 600 mg + vit d 800 iu- 1 tab daily    [provider]  carvedilol (COREG) 12.5 MG tablet Take 12.5 mg by mouth 2 (two) times daily with a meal.    [provider]  Cholecalciferol (VITAMIN D3) 1000 units CAPS Take by mouth.    [provider]  donepezil (ARICEPT) 10 MG tablet Take 1 tablet (10 mg total) by mouth at bedtime. 09/05/18   Galen Manila, NP  hydrALAZINE (APRESOLINE) 25 MG tablet Take 25 mg by mouth 2 (two) times daily. 06/24/19   [provider]  memantine (NAMENDA) 5 MG tablet Take 5 mg by mouth 2 (two) times daily. 07/19/19   [provider]  methimazole (TAPAZOLE) 5 MG tablet Take 0.5 tablets by mouth daily.  04/13/18   [provider]  potassium chloride SA (K-DUR) 20 MEQ tablet Take 1 tablet (20 mg total) by mouth every day Patient taking differently: Take 20 mEq by mouth 2 (two) times daily. Take 1 tablet (20 mg total) by mouth every day 09/05/18   Galen Manila, NP  sertraline (ZOLOFT) 25 MG tablet Take 1 tablet (25 mg total) by mouth daily. 09/05/18   Galen Manila, NP  sotalol (BETAPACE) 80 MG tablet Take 40 mg by mouth 2 (two) times daily.  08/25/18 08/25/19  [provider]  torsemide (DEMADEX) 20 MG tablet Take 20 mg by mouth 2 (two) times daily.    [provider]  Vitamin D, Ergocalciferol, (DRISDOL) 1.25 MG (50000 UNIT) CAPS capsule Take 50,000 Units by mouth once a week. 04/25/19   [provider]    Allergies    Lisinopril  Review of Systems   Review  of Systems  Unable to perform ROS: Dementia (level 5 caveat)  All other systems reviewed and are negative.   Physical Exam Updated Vital Signs BP 123/68   Pulse 61   Temp 97.8 F (36.6 C) (Axillary)   Resp 20   LMP  (LMP Unknown)   SpO2 97%   Physical Exam Vitals and nursing note reviewed.  Constitutional:      General: She is not in acute distress.    Appearance: She is well-developed. She is obese.  HENT:     Head: Normocephalic and atraumatic.     Comments: Oropharynx non-erythematous.  No tonsillar swelling or exudate.  No uvular deviation.  No drooling. No brawny edema. No stridor. Voice is not muffled.  Eyes:     Conjunctiva/sclera: Conjunctivae normal.  Cardiovascular:     Rate and Rhythm: Normal rate and regular rhythm.  Pulmonary:     Effort: Pulmonary effort is normal. No respiratory distress.     Comments: 99% on room air Crackles in lung bases Abdominal:     Palpations: Abdomen is soft.     Tenderness: There is no abdominal tenderness.     Comments: Peg tube in place  Musculoskeletal:     Cervical back: Neck supple.     Right lower leg: No tenderness. No edema.     Left lower leg: No tenderness. No edema.  Skin:    General: Skin is warm and dry.  Neurological:     Mental Status: She is alert.     Comments: At baseline mental state per daughter's report Moving all extremities No facial droop     ED Results / Procedures / Treatments   Labs (all labs ordered are listed, but only abnormal results are displayed) Labs Reviewed  BASIC METABOLIC PANEL - Abnormal; Notable for the following components:      Result Value   Glucose, Bld 185 (*)    BUN 28 (*)    Calcium 8.8 (*)    All other components within normal limits  CBC - Abnormal; Notable for the following components:   WBC 11.5 (*)    RBC 3.38 (*)    Hemoglobin 9.8 (*)    HCT 32.4 (*)    RDW 16.7 (*)    nRBC 0.3 (*)    All other components within normal limits  BRAIN NATRIURETIC  PEPTIDE - Abnormal; Notable for the following components:   B Natriuretic Peptide 362.3 (*)    All other components within normal limits  TROPONIN I (HIGH SENSITIVITY) - Abnormal; Notable for the following components:   Troponin I (High Sensitivity) 30 (*)    All other components within normal limits    EKG EKG Interpretation  Date/Time:  Tuesday September 12 2019 17:21:52 EDT Ventricular Rate:  134 PR Interval:    QRS Duration: 85 QT Interval:  286 QTC Calculation: 427 R Axis:   76 Text Interpretation: A Fib Prolonged PR interval Repolarization abnormality, prob rate related No STEMI Confirmed by Alvester Chou [id number]) on 09/12/2019 5:24:53 PM   Radiology DG Chest 2 View  Result Date: 09/12/2019 CLINICAL DATA:  Congestive heart failure, shortness of breath EXAM: CHEST - 2 VIEW COMPARISON:  08/28/2019 FINDINGS: Of enteric tube. The heart size and mediastinal contours are stable. Atherosclerotic calcification of the aortic knob. Persistent elevation of the right hemidiaphragm. Streaky bibasilar opacities, left greater than right. Appreciable pleural fluid collection. No pneumothorax. IMPRESSION: Streaky bibasilar opacities, left greater than right, which may represent atelectasis or infiltrate. Electronically Signed   By: Duanne Guess D.O.   On: 09/12/2019 13:08    Procedures Procedures (including critical care time)  Medications Ordered in ED Medications - No data to display  ED Course  I have reviewed the triage vital signs and the nursing notes.  Pertinent labs & imaging results that were available during my care of the patient were reviewed by me and considered in my medical decision making (see chart for details).  This is a 85 year old female with history of congestive heart failure, NSTEMI, recent hospitalization and intubation for ACE inhibitor induced angioedema, with baseline dementia, with PEG tube, presenting to the ED with concern for bilateral hand and arm  swelling, tachycardia, and respiratory distress.  Upon my evaluation of the patient, he does not appear to have any  acute respiratory distress.  She is satting 99% on room air and is not tachypneic.  She does have crackles in the lower lung bases with chest x-ray suggestive of atelectasis, this was also seen on CT scan during her prior hospital visit.  I suspect this is chronic and less likely PNA.  She takes nothing by mouth in terms of aspiration risk.  She is not hypoxic or tachycardic at this time to suggest PE.  Cardiac work-up here showed an EKG which demonstrates normal sinus rhythm.  She is not in rapid A Fib here, but may have had a run of this at her facility earlier today.  Her BNP has some minor increase to the 362 from 210 one month ago. I do suspect this may be consistent with some worsening CHF.  We can increase her diuresis (her kidney function is acceptable) with her torsemide dosing, and recommend an outpatient echocardiogram, but I do not think she would need emergent hospitalization at this time for this issue alone.  We are awaiting troponin levels, and will reassess.  She is COVID vaccinated, and afebrile here.  Doubt covid PNA, sepsis, or other significant bacterial infection.  Additional history was provided by her daughter at bedside and nurse at facility I personally reviewed her prior medical records including last hospital course. I personally reviewed her labs as noted above.  Clinical Course as of Sep 12 2155  Tue Sep 12, 2019  1721 Patient now appears to be in A. fib her telemetry review with a heart rate of 130.  She is again experiencing some symptoms of shortness of breath.  We will give her rate control here.   [MT]  1844 Patient's heart rate spontaneously converted back to normal sinus rhythm with a rate of 60.  I have asked the nurse to hold the IV metoprolol.  I have also decided to hold the IV Lasix given the patient is on a respiratory distress.  I would  recommend some titration to her torsemide dosing.  Her rehab facility can increase the morning dose to keep her on a regular evening dose.  I would recommend that they discuss the case with her cardiologist tomorrow, as she needs better rate control.  I would advise that they continue her sotalol dosing for rate control tonight.   [MT]    Clinical Course User Index [MT] Taevon Aschoff, Kermit BaloMatthew J, MD    Final Clinical Impression(s) / ED Diagnoses Final diagnoses:  Atrial fibrillation with rapid ventricular response (HCC)  Edema, unspecified type    Rx / DC Orders ED Discharge Orders    None       Terald Sleeperrifan, Iria Jamerson J, MD 09/12/19 2157

## 2019-09-13 LAB — CBG MONITORING, ED: Glucose-Capillary: 126 mg/dL — ABNORMAL HIGH (ref 70–99)

## 2019-09-20 ENCOUNTER — Other Ambulatory Visit (HOSPITAL_COMMUNITY): Payer: Self-pay

## 2019-09-20 DIAGNOSIS — R1310 Dysphagia, unspecified: Secondary | ICD-10-CM

## 2019-09-27 ENCOUNTER — Other Ambulatory Visit: Payer: Self-pay

## 2019-09-27 ENCOUNTER — Ambulatory Visit (HOSPITAL_COMMUNITY)
Admission: RE | Admit: 2019-09-27 | Discharge: 2019-09-27 | Disposition: A | Discharge status: Home / Self Care | Payer: Medicare Other | Source: Ambulatory Visit | Attending: Family Medicine | Admitting: Family Medicine

## 2019-09-27 DIAGNOSIS — R1310 Dysphagia, unspecified: Secondary | ICD-10-CM

## 2019-09-27 NOTE — Progress Notes (Signed)
Modified Barium Swallow Progress Note  Patient Details  Name: Melanie Lewis MRN: [id number] Date of Birth: [birth year]  Today's Date: 09/27/2019  Modified Barium Swallow completed.  Full report located under Chart Review in the Imaging Section.  Brief recommendations include the following:  Clinical Impression  Pt presents with a mild oropharyngeal dysphagia. Her oral phase is characterized by lingual pumping and premature spillage at the level of the valleculae with honey and to the pyriform sinuses with nectar and thin liquids. She also demonstrated prolonged but adequate mastication during the oral phase with a soft solid. Pt had delayed swallow initiation to the level of the valleculae and pyriforms depending on bolus size. Trace penetration was observed with nectar. Patient demonstrated trace silent penetration and trace silent aspiration wtih thin liquids. Aspiration occured with larger bolus. Pt was in a chin tuck position during some sips of thin, but still had significant penetration. Suggest dys 1 (puree) and nectar. Defer to primary therapist.   Swallow Evaluation Recommendations       SLP Diet Recommendations: Dysphagia 1 (Puree) solids;Nectar thick liquid   Liquid Administration via: Straw;Cup   Medication Administration: Whole meds with puree   Supervision: Intermittent supervision to cue for compensatory strategies           Oral Care Recommendations: Oral care BID        Royetta Crochet 09/27/2019,2:11 PM

## 2019-10-12 ENCOUNTER — Other Ambulatory Visit (HOSPITAL_COMMUNITY): Payer: Self-pay | Admitting: *Deleted

## 2019-10-12 DIAGNOSIS — R1310 Dysphagia, unspecified: Secondary | ICD-10-CM

## 2019-10-18 ENCOUNTER — Ambulatory Visit (HOSPITAL_COMMUNITY)
Admission: RE | Admit: 2019-10-18 | Discharge: 2019-10-18 | Disposition: A | Discharge status: Home / Self Care | Payer: Medicare Other | Source: Ambulatory Visit | Attending: Family Medicine | Admitting: Family Medicine

## 2019-10-18 ENCOUNTER — Other Ambulatory Visit: Payer: Self-pay

## 2019-10-18 DIAGNOSIS — R1310 Dysphagia, unspecified: Secondary | ICD-10-CM | POA: Insufficient documentation

## 2019-10-18 NOTE — Progress Notes (Signed)
Objective Swallowing Evaluation: Type of Study: MBS-Modified Barium Swallow Study   Patient Details  Name: Melanie Lewis MRN: [id number] Date of Birth: 1934/02/27  Today's Date: 10/18/2019 Time: SLP Start Time (ACUTE ONLY): 1140 -SLP Stop Time (ACUTE ONLY): 1205  SLP Time Calculation (min) (ACUTE ONLY): 25 min   Past Medical History:  Past Medical History:  Diagnosis Date  . CHF (congestive heart failure) (HCC)   . Coronary artery disease   . Dementia (HCC)   . Heart attack (HCC)   . Hypertension   . hyperthyroid   . Hyperthyroidism   . MI (myocardial infarction) [facility])    Past Surgical History:  Past Surgical History:  Procedure Laterality Date  . CARDIAC CATHETERIZATION N/A 08/14/2014   Procedure: Left Heart Cath and Coronary Angiography;  Surgeon: Marcina Millard, MD;  Location: [facility] INVASIVE CV LAB;  Service: Cardiovascular;  Laterality: N/A;  . CHOLECYSTECTOMY    . IR GASTROSTOMY TUBE MOD SED  09/01/2019  . PARTIAL HYSTERECTOMY     HPI: Patient is a 85 year old female with history of dementia, paroxysmal SVT, diastolic CHF, CAD, HTN and hypothyroidism. She was admitted [facility] 07/28/2019 with acute respiratory failure in the setting of severe angioedema requiring intubation with mechanical ventilation until 08/18/2019. PEG placed during that admission.  MBS on Sept 15, 2021 revealed mild dysphagia with aspiration of thin liquids.  Pt has been eating a soft mechanical diet with nectar thick liquids at the SNF where she resides.    Subjective: alert, participatory    Assessment / Plan / Recommendation  CHL IP CLINICAL IMPRESSIONS 10/18/2019  Clinical Impression Pt's swallow function appears relatively consistent with results from September's study.  She demonstrates improved oral manipulation of solids.  Thin liquids continue to spill prematurely over base of tonge, and intermittently enter laryngeal vestibule before swallow trigger.  Laryngeal vestibule closure is  complete.  Efforts were made to instruct the pt to attempt to hold liquids orally before releasing them into the throat, but she had difficulty carrying over this technique.  Aspiration, when it occurred, was trace and reached the vocal cords and just below.  There was mild throat-clearing accompanying these events.  There was excellent pharyngeal clearance of all solid barium boluses.  Recommend that pt continue to have nectar thick liquids during meals.  Between meals, recommend initiating a free water protocol after SLP training, as well as focusing on improved timing of swallow initiation (oral control, oral holds) since her ability to close laryngeal vestibule is good, just not timed well with thin liquid consumption. Pt's daughter present - we viewed video and discussed recommendations; answered questions.  Recommend continued SLP therapy at SNF.    SLP Visit Diagnosis Dysphagia, oropharyngeal phase (R13.12)  Attention and concentration deficit following --  Frontal lobe and executive function deficit following --  Impact on safety and function Mild aspiration risk      CHL IP TREATMENT RECOMMENDATION 10/18/2019  Treatment Recommendations Defer treatment plan to f/u with SLP     Prognosis 08/25/2019  Prognosis for Safe Diet Advancement Guarded  Barriers to Reach Goals Cognitive deficits;Severity of deficits;Language deficits  Barriers/Prognosis Comment --    CHL IP DIET RECOMMENDATION 10/18/2019  SLP Diet Recommendations Dysphagia 3 (Mech soft) solids;Nectar thick liquid  Liquid Administration via Cup;Straw  Medication Administration Whole meds with puree  Compensations --  Postural Changes --      CHL IP OTHER RECOMMENDATIONS 10/18/2019  Recommended Consults --  Oral Care Recommendations Oral care BID  Other Recommendations --  CHL IP FOLLOW UP RECOMMENDATIONS 10/18/2019  Follow up Recommendations Skilled Nursing facility      [facility] IP FREQUENCY AND DURATION 08/25/2019  Speech  Therapy Frequency (ACUTE ONLY) min 3x week  Treatment Duration 2 weeks           CHL IP ORAL PHASE 10/18/2019  Oral Phase Impaired  Oral - Pudding Teaspoon --  Oral - Pudding Cup --  Oral - Honey Teaspoon NT  Oral - Honey Cup --  Oral - Nectar Teaspoon NT  Oral - Nectar Cup NT  Oral - Nectar Straw NT  Oral - Thin Teaspoon --  Oral - Thin Cup Piecemeal swallowing;Premature spillage  Oral - Thin Straw Piecemeal swallowing;Premature spillage  Oral - Puree WFL  Oral - Mech Soft --  Oral - Regular WFL  Oral - Multi-Consistency --  Oral - Pill --  Oral Phase - Comment --    CHL IP PHARYNGEAL PHASE 10/18/2019  Pharyngeal Phase Impaired  Pharyngeal- Pudding Teaspoon --  Pharyngeal --  Pharyngeal- Pudding Cup --  Pharyngeal --  Pharyngeal- Honey Teaspoon NT  Pharyngeal --  Pharyngeal- Honey Cup --  Pharyngeal --  Pharyngeal- Nectar Teaspoon NT  Pharyngeal --  Pharyngeal- Nectar Cup --  Pharyngeal --  Pharyngeal- Nectar Straw --  Pharyngeal --  Pharyngeal- Thin Teaspoon --  Pharyngeal --  Pharyngeal- Thin Cup Delayed swallow initiation-pyriform sinuses;Delayed swallow initiation-vallecula;Penetration/Aspiration before swallow;Trace aspiration  Pharyngeal Material enters airway, remains ABOVE vocal cords then ejected out;Material enters airway, remains ABOVE vocal cords and not ejected out  Pharyngeal- Thin Straw Delayed swallow initiation-pyriform sinuses;Penetration/Aspiration before swallow;Trace aspiration  Pharyngeal Material enters airway, passes BELOW cords and not ejected out despite cough attempt by patient;Material enters airway, remains ABOVE vocal cords then ejected out  Pharyngeal- Puree Delayed swallow initiation-vallecula  Pharyngeal --  Pharyngeal- Mechanical Soft --  Pharyngeal --  Pharyngeal- Regular --  Pharyngeal --  Pharyngeal- Multi-consistency --  Pharyngeal --  Pharyngeal- Pill --  Pharyngeal --  Pharyngeal Comment --     No flowsheet data  found.   Blenda Mounts Laurice 10/18/2019, 1:05 PM   Marigene Erler L. Samson Frederic, MA CCC/SLP Acute Rehabilitation Services Office number [phone] Pager [phone]

## 2019-12-13 ENCOUNTER — Other Ambulatory Visit (HOSPITAL_COMMUNITY): Payer: Self-pay | Admitting: Family Medicine

## 2019-12-13 DIAGNOSIS — R1312 Dysphagia, oropharyngeal phase: Secondary | ICD-10-CM

## 2019-12-19 ENCOUNTER — Other Ambulatory Visit: Payer: Self-pay

## 2019-12-19 ENCOUNTER — Ambulatory Visit (HOSPITAL_COMMUNITY)
Admission: RE | Admit: 2019-12-19 | Discharge: 2019-12-19 | Disposition: A | Discharge status: Home / Self Care | Payer: Medicare Other | Source: Ambulatory Visit | Attending: Family Medicine | Admitting: Family Medicine

## 2019-12-19 DIAGNOSIS — R1312 Dysphagia, oropharyngeal phase: Secondary | ICD-10-CM | POA: Diagnosis not present

## 2019-12-19 DIAGNOSIS — Z431 Encounter for attention to gastrostomy: Secondary | ICD-10-CM | POA: Diagnosis present

## 2019-12-19 HISTORY — PX: IR GASTROSTOMY TUBE REMOVAL: IMG5492

## 2019-12-19 MED ORDER — LIDOCAINE VISCOUS HCL 2 % MT SOLN
OROMUCOSAL | Status: AC
  Filled 2019-12-19: qty 15

## 2022-04-20 IMAGING — DX DG ABD PORTABLE 1V
1 series · 1 of 1 positions shown · non-contrast
Comparison: None.

CLINICAL DATA: Post intubation

EXAM:
PORTABLE ABDOMEN - 1 VIEW

[abdomen supine]
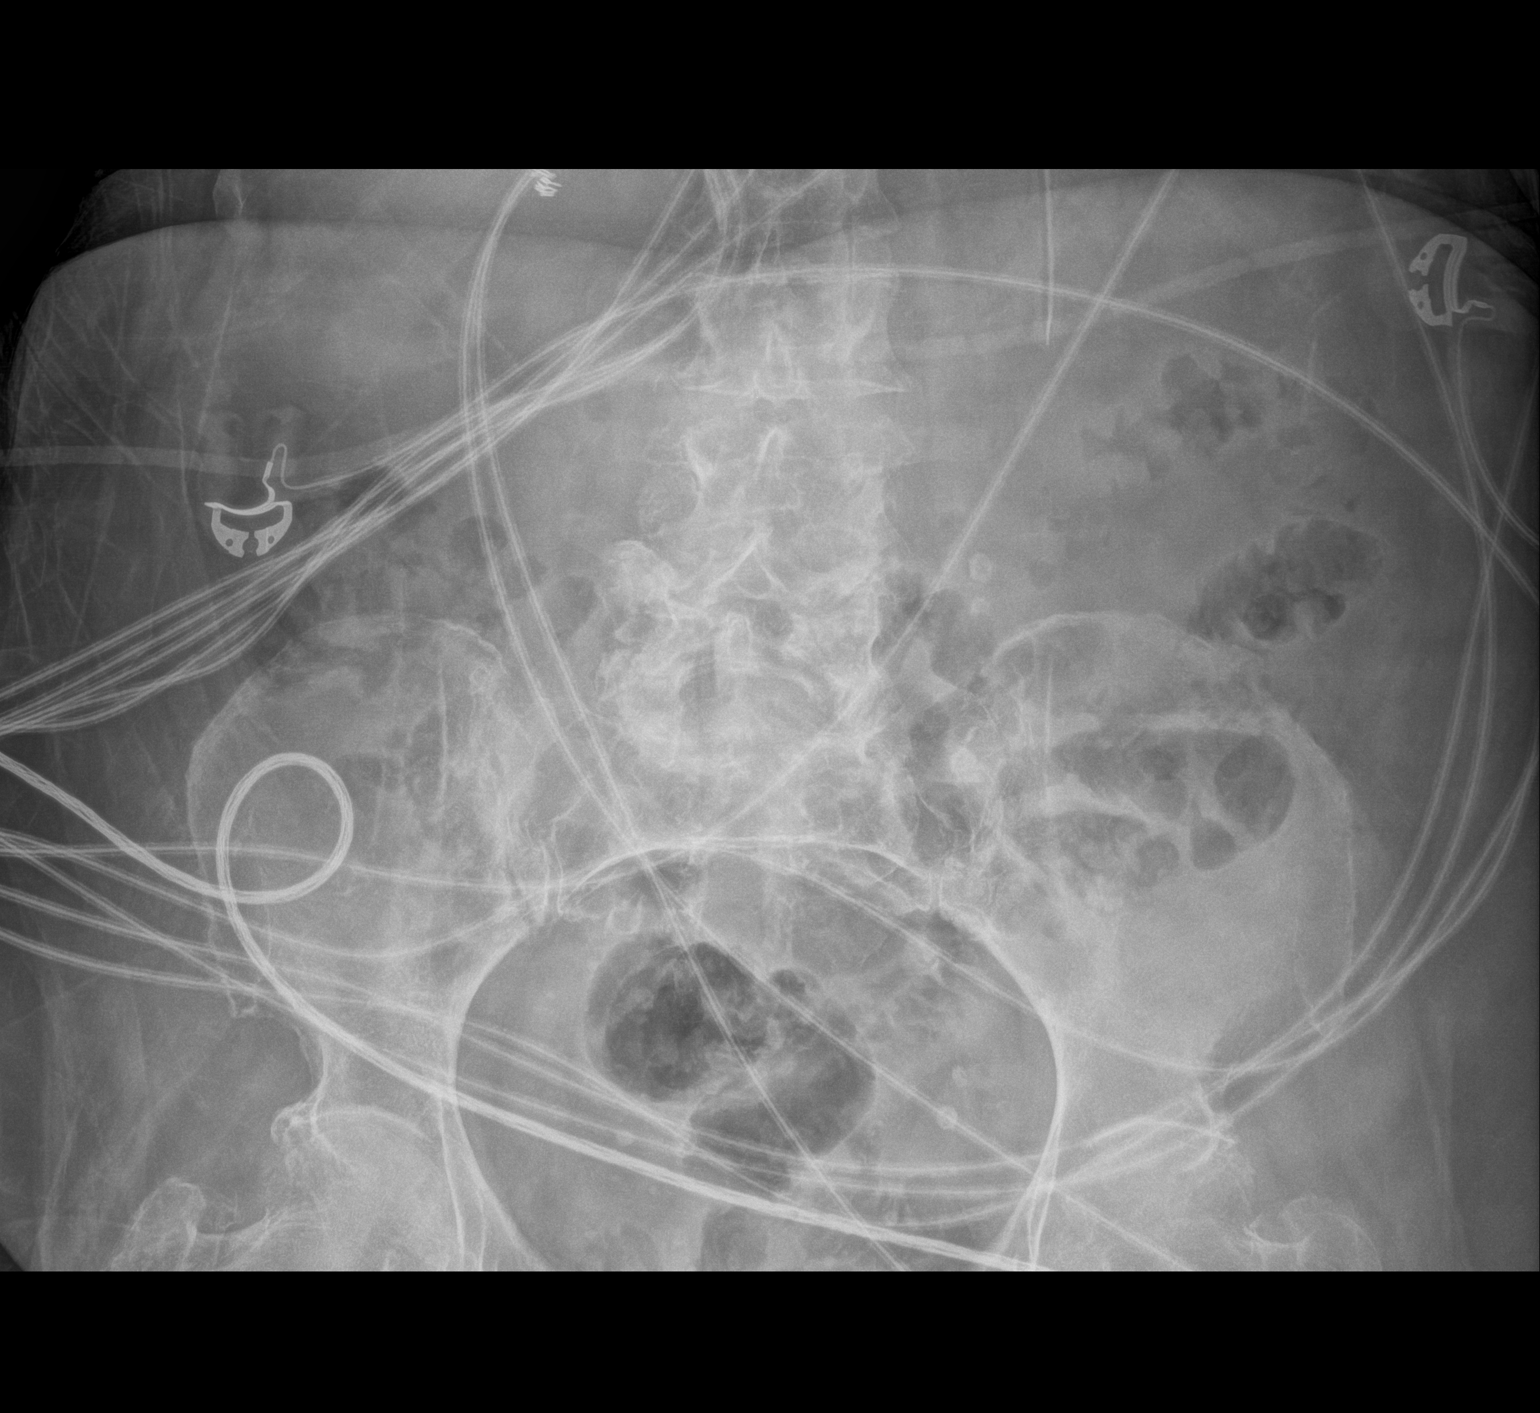

[1 of 1 positions shown; findings below may reference images not displayed]

FINDINGS: The bowel gas pattern is normal. Tip the NG tube is seen within the
proximal stomach. No acute osseous.
IMPRESSION: Tip the NG tube in the proximal stomach.

## 2022-04-29 ENCOUNTER — Encounter: Payer: Self-pay | Admitting: Emergency Medicine

## 2022-04-29 DIAGNOSIS — S61012A Laceration without foreign body of left thumb without damage to nail, initial encounter: Secondary | ICD-10-CM | POA: Diagnosis not present

## 2022-04-29 DIAGNOSIS — Z7901 Long term (current) use of anticoagulants: Secondary | ICD-10-CM | POA: Diagnosis not present

## 2022-04-29 DIAGNOSIS — Z23 Encounter for immunization: Secondary | ICD-10-CM | POA: Diagnosis not present

## 2022-04-29 DIAGNOSIS — Z7982 Long term (current) use of aspirin: Secondary | ICD-10-CM | POA: Diagnosis not present

## 2022-04-29 DIAGNOSIS — S6992XA Unspecified injury of left wrist, hand and finger(s), initial encounter: Secondary | ICD-10-CM | POA: Diagnosis present

## 2022-04-29 DIAGNOSIS — W260XXA Contact with knife, initial encounter: Secondary | ICD-10-CM | POA: Insufficient documentation

## 2022-04-29 DIAGNOSIS — Y93G1 Activity, food preparation and clean up: Secondary | ICD-10-CM | POA: Diagnosis not present

## 2022-04-29 NOTE — ED Triage Notes (Signed)
Pt presents ambulatory to triage via POV with complaints of laceration to the L thumb that occurred this evening around 1600. Pt states she was preparing some food and sliced the pad of her L thumb. Takes ASA daily; bleeding controlled with pressure, gauze, and coban. A&Ox4 at this time. Denies dizziness, CP or SOB.

## 2022-04-30 ENCOUNTER — Emergency Department
Admission: EM | Admit: 2022-04-30 | Discharge: 2022-04-30 | Disposition: A | Discharge status: Home / Self Care | Payer: 59 | Attending: Emergency Medicine | Admitting: Emergency Medicine

## 2022-04-30 DIAGNOSIS — S61012A Laceration without foreign body of left thumb without damage to nail, initial encounter: Secondary | ICD-10-CM | POA: Diagnosis not present

## 2022-04-30 MED ORDER — LIDOCAINE HCL (PF) 1 % IJ SOLN
INTRAMUSCULAR | Status: AC
  Administered 2022-04-30: 5 mL
  Filled 2022-04-30: qty 5

## 2022-04-30 MED ORDER — TETANUS-DIPHTH-ACELL PERTUSSIS 5-2.5-18.5 LF-MCG/0.5 IM SUSY
0.5000 mL | PREFILLED_SYRINGE | Freq: Once | INTRAMUSCULAR | Status: AC
  Administered 2022-04-30: 0.5 mL via INTRAMUSCULAR
  Filled 2022-04-30: qty 0.5

## 2022-04-30 NOTE — ED Provider Notes (Signed)
[facility] Provider Note    Event Date/Time   First MD Initiated Contact with Patient 04/30/22 [phone]     (approximate)   History   Laceration   HPI  Melanie Lewis is a 88 y.o. female who presents with a left thumb laceration, acute onset around 4 PM occurring when the patient accidentally cut it with a kitchen knife while preparing food.  She tried to apply pressure but the bleeding has persisted.  The patient is on aspirin and Eliquis.  She denies any other injuries.      Physical Exam   Triage Vital Signs: ED Triage Vitals  Enc Vitals Group     BP 04/29/22 2314 101/78     Pulse Rate 04/29/22 2314 (!) 108     Resp 04/29/22 2314 18     Temp 04/29/22 2314 97.9 F (36.6 C)     Temp Source 04/29/22 2314 Oral     SpO2 04/29/22 2314 100 %     Weight 04/29/22 2315 203 lb 7.8 oz (92.3 kg)     Height 04/29/22 2315  (1.575 m)     Head Circumference --      Peak Flow --      Pain Score 04/29/22 2314 7     Pain Loc --      Pain Edu? --      Excl. in GC? --     Most recent vital signs: Vitals:   04/29/22 2314  BP: 101/78  Pulse: (!) 108  Resp: 18  Temp: 97.9 F (36.6 C)  SpO2: 100%     General: Awake, no distress.  CV:  Good peripheral perfusion.  Resp:  Normal effort.  Abd:  No distention.  Other:  2 cm superficial C-shaped laceration to the volar aspect of the left thumb over the IP joint.  No visible tendon or deep soft tissue.  Full range of motion in flexion and extension.  Normal cap refill distally.   ED Results / Procedures / Treatments   Labs (all labs ordered are listed, but only abnormal results are displayed) Labs Reviewed - No data to display   EKG     RADIOLOGY    PROCEDURES:  Critical Care performed: No  ..Laceration Repair  Date/Time: 04/30/2022 1:06 AM  Performed by: Dionne Bucy, MD Authorized by: Dionne Bucy, MD   Consent:    Consent obtained:  Verbal   Consent given by:   Patient   Alternatives discussed:  No treatment Universal protocol:    Patient identity confirmed:  Verbally with patient Anesthesia:    Anesthesia method:  Local infiltration   Local anesthetic:  Lidocaine 1% w/o epi Laceration details:    Location:  Finger   Finger location:  L thumb   Length (cm):  2   Depth (mm):  3 Treatment:    Area cleansed with:  Povidone-iodine   Amount of cleaning:  Standard   Irrigation solution:  Sterile water   Irrigation volume:    Irrigation method:  Syringe   Debridement:  None Skin repair:    Repair method:  Sutures   Suture size:  4-0   Suture material:  Nylon   Suture technique:  Simple interrupted   Number of sutures:  3 Approximation:    Approximation:  Close Repair type:    Repair type:  Simple Post-procedure details:    Dressing:  Sterile dressing   Procedure completion:  Tolerated well, no immediate complications  MEDICATIONS ORDERED IN ED: Medications  Tdap (BOOSTRIX) injection 0.5 mL (0.5 mLs Intramuscular Given 04/30/22 0100)  lidocaine (PF) (XYLOCAINE) 1 % injection (5 mLs  Given 04/30/22 0045)     IMPRESSION / MDM / ASSESSMENT AND PLAN / ED COURSE  I reviewed the triage vital signs and the nursing notes.  Differential diagnosis includes, but is not limited to, superficial skin laceration.  There is no evidence of tendon or other deep soft tissue injury.  Persistent bleeding is likely due to the patient being on Eliquis.  I repaired the laceration and hemostasis has been achieved.  I applied a sterile dressing.  The patient is stable for discharge.  We updated her tetanus.  Patient's presentation is most consistent with acute, uncomplicated illness.  I instructed the patient to return in 7 days for suture removal.  I gave strict return precautions and she expressed understanding.   FINAL CLINICAL IMPRESSION(S) / ED DIAGNOSES   Final diagnoses:  Laceration of left thumb without foreign body without damage to  nail, initial encounter     Rx / DC Orders   ED Discharge Orders     None        Note:  This document was prepared using Dragon voice recognition software and may include unintentional dictation errors.   Dionne Bucy, MD 04/30/22 [phone]

## 2022-04-30 NOTE — Discharge Instructions (Signed)
Keep the area clean and dry.  Apply bacitracin or Neosporin ointment twice daily.  Return in 7 days to have the sutures removed.

## 2022-05-01 IMAGING — DX DG CHEST 1V PORT
1 series · 1 of 1 positions shown · non-contrast
Comparison: August 05, 2019.

CLINICAL DATA: Hypoxia

EXAM:
PORTABLE CHEST 1 VIEW

[chest ap]
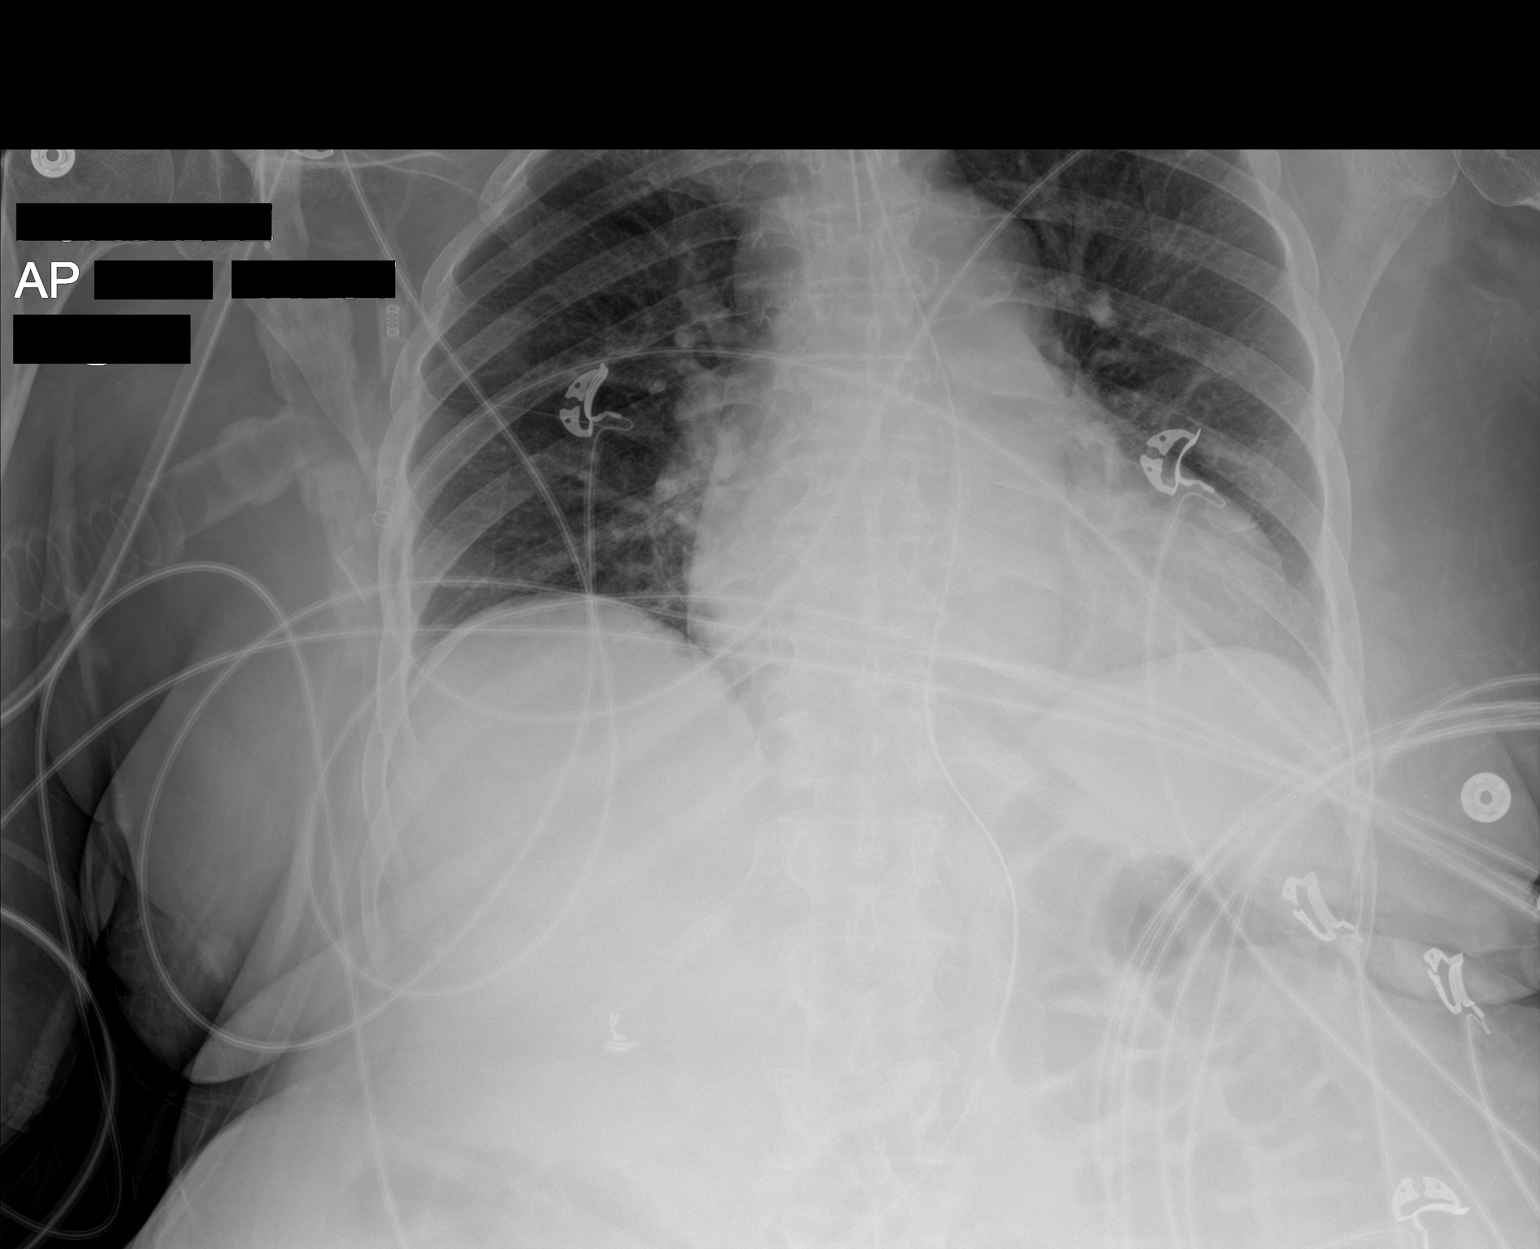

[1 of 1 positions shown; findings below may reference images not displayed]

FINDINGS: Endotracheal tube tip is 3.4 cm above the carina. Nasogastric tube
tip and side port are in the stomach. No pneumothorax. Lungs are
clear. Heart is mildly enlarged with pulmonary vascularity normal.
No adenopathy. There is aortic atherosclerosis. No bone lesions.
IMPRESSION: Tube positions as described without pneumothorax. Lungs clear.
Stable cardiac prominence.

Aortic Atherosclerosis ([id number]-BIE.E).

## 2022-05-05 IMAGING — US US RENAL
1 series · 15 of 25 positions shown · non-contrast
Comparison: Prior radiograph from 07/28/2019

CLINICAL DATA: Initial evaluation for acute renal failure.

EXAM:
RENAL / URINARY TRACT ULTRASOUND COMPLETE

[Series 1: us renal · 15 of 26 slices shown]
[im 1/26]
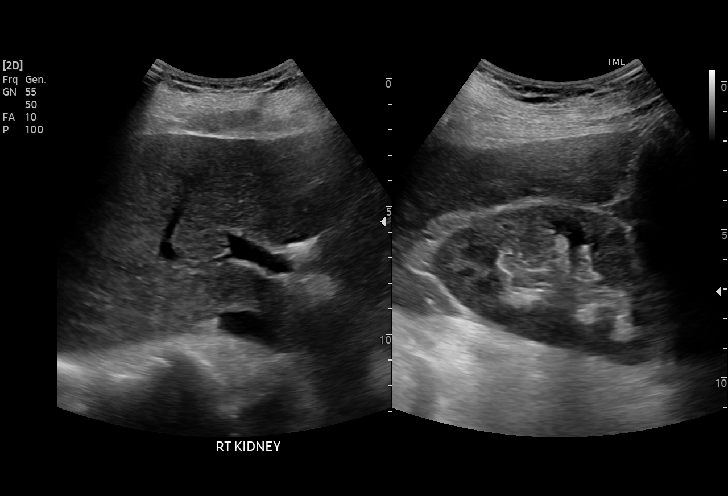
[im 3/26]
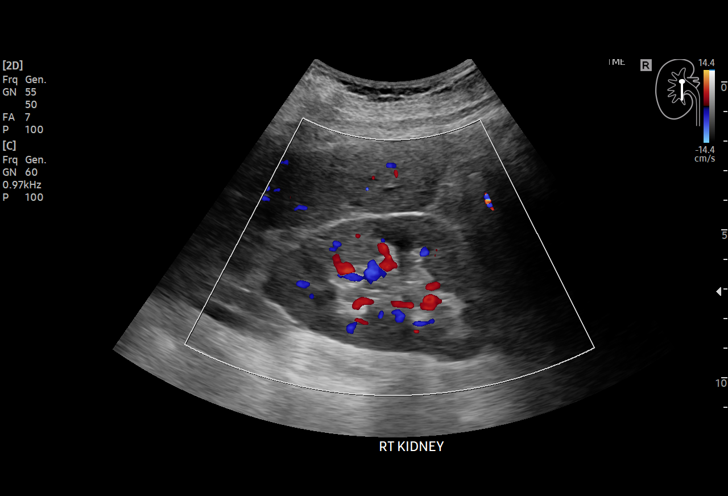
[im 5/26]
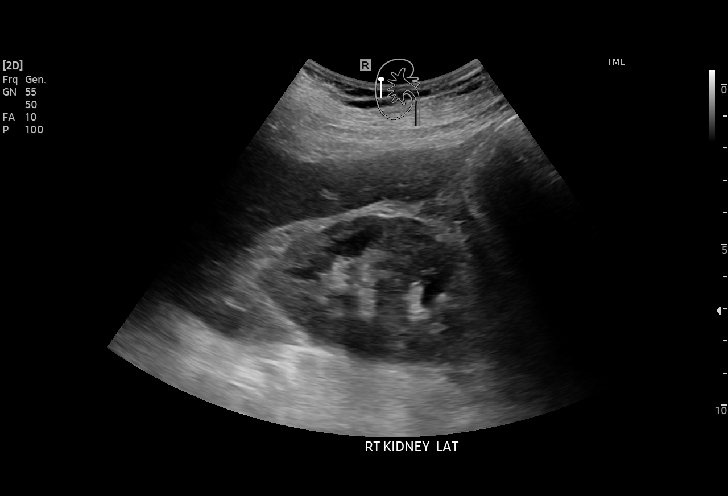
[im 6/26]
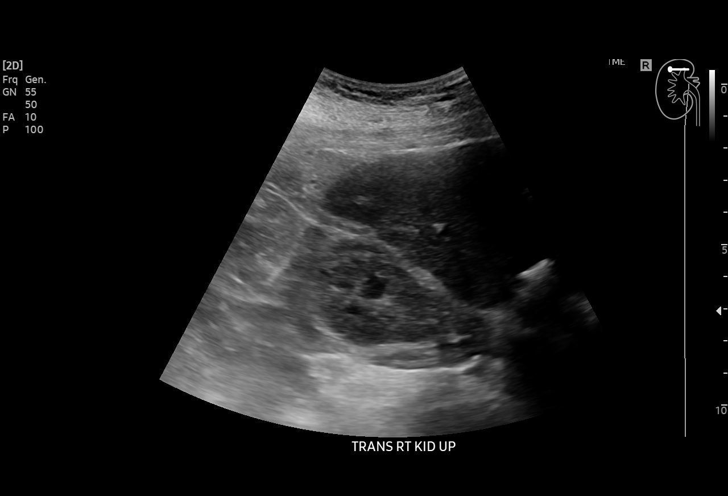
[im 8/26]
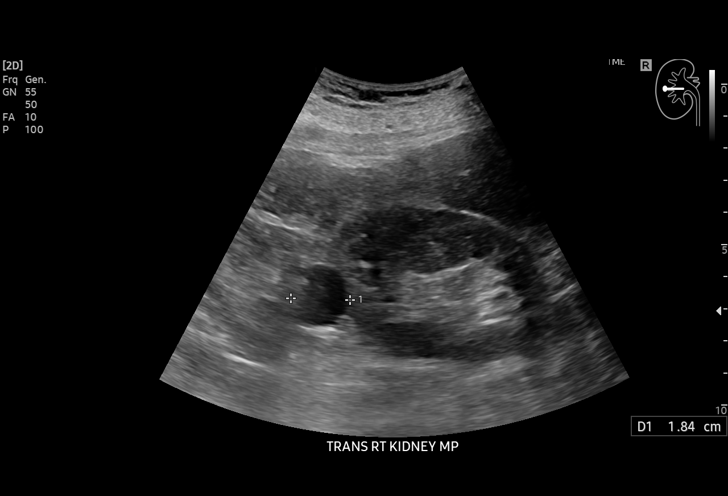
[im 10/26]
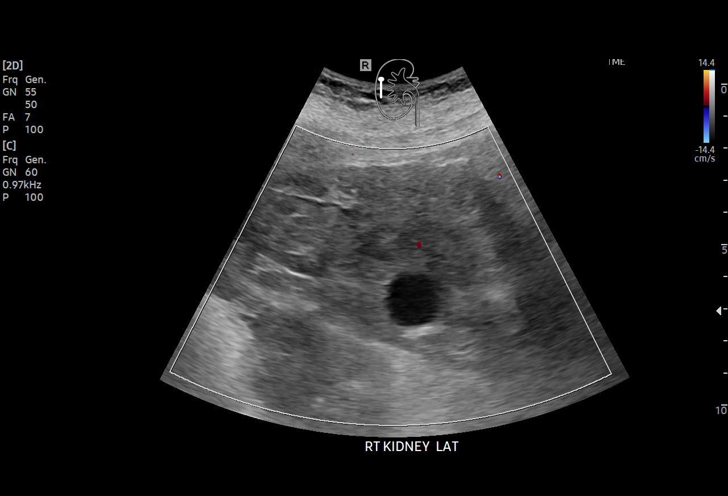
[im 11/26]
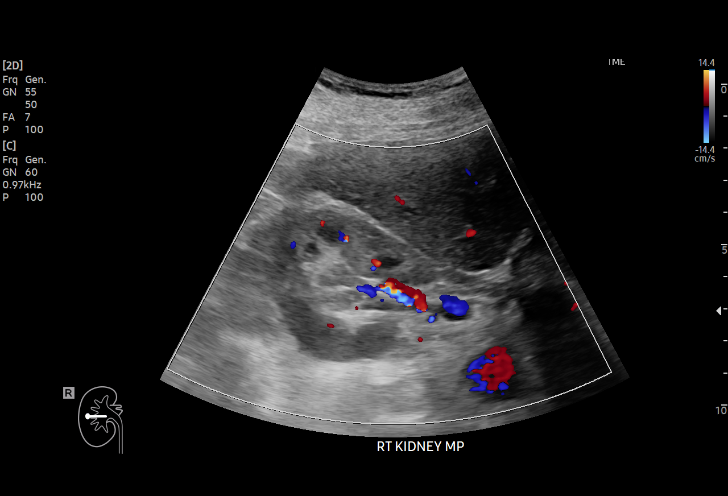
[im 13/26]
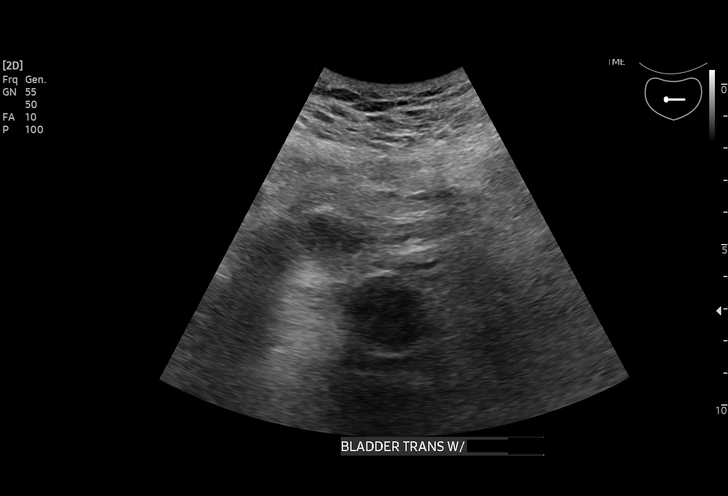
[im 15/26]
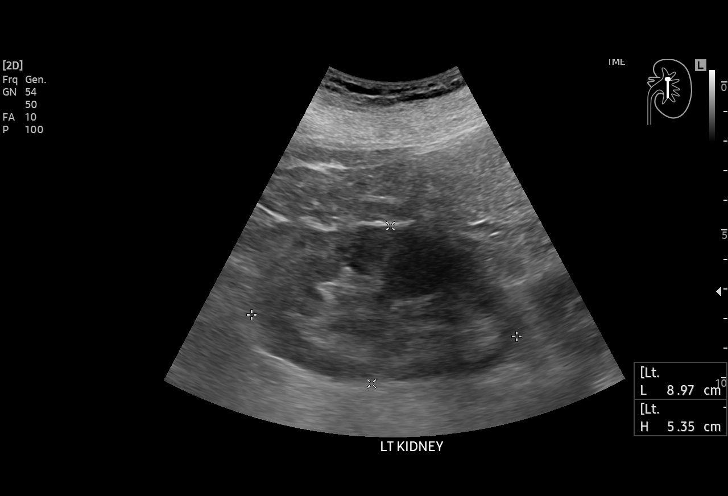
[im 16/26]
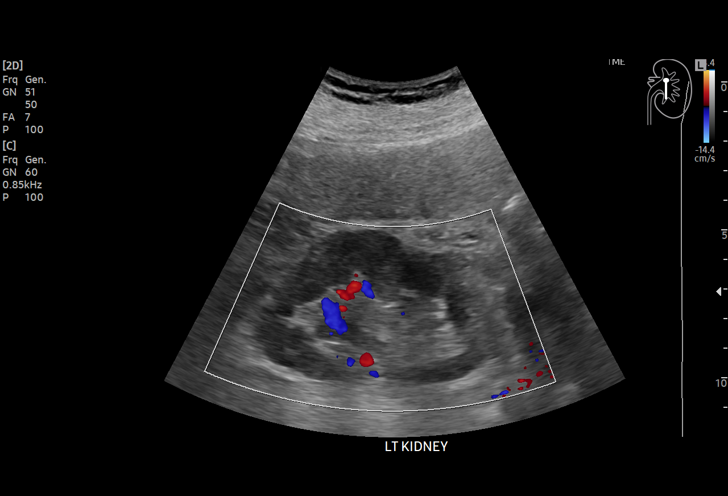
[im 18/26]
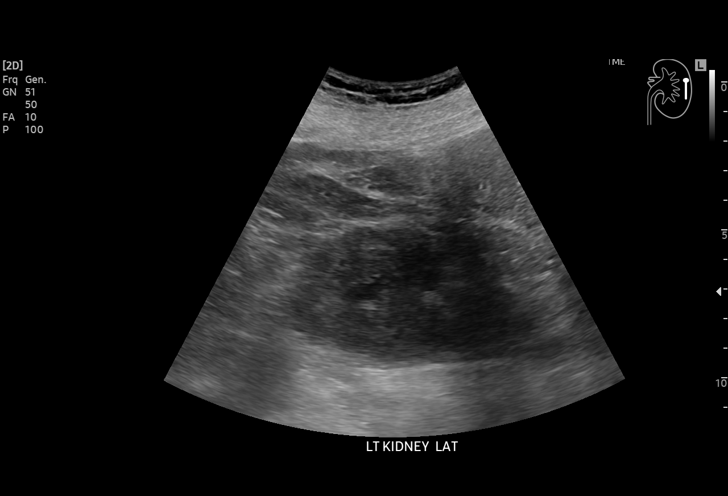
[im 20/26]
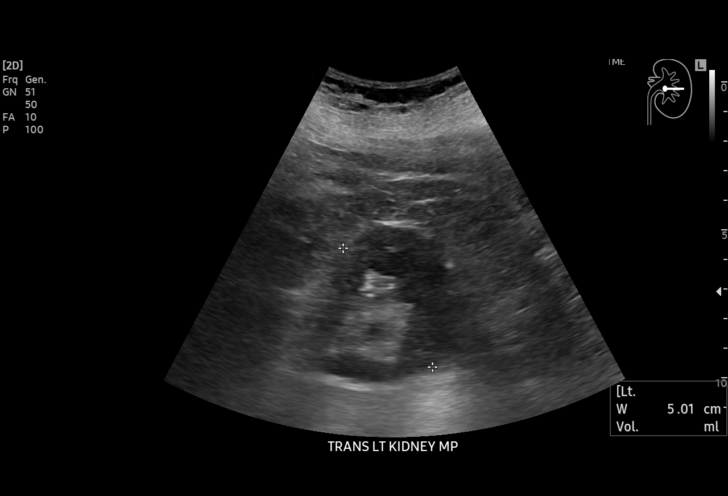
[im 21/26]
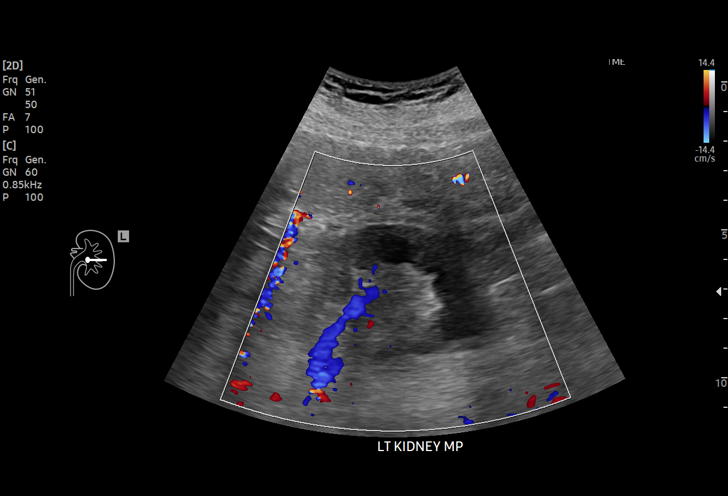
[im 23/26]
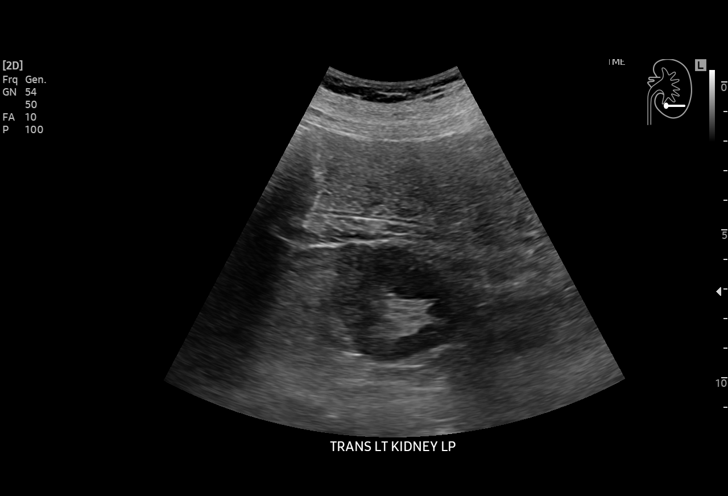
[im 26/26]
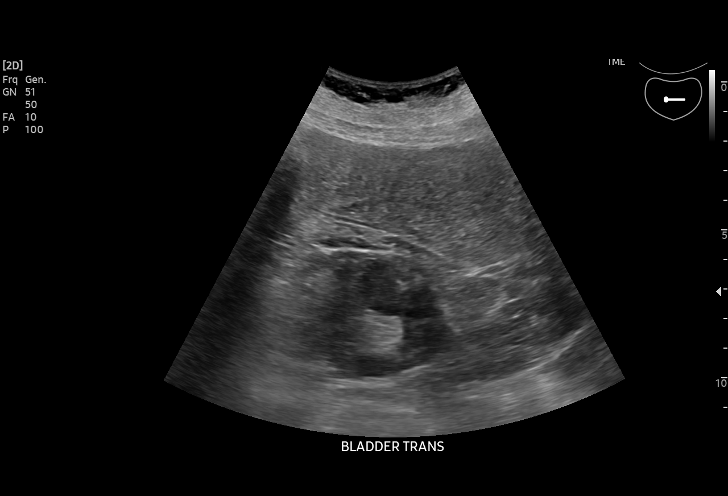

[15 of 25 positions shown; findings below may reference images not displayed]

FINDINGS: Right Kidney:

Renal measurements: 8.8 x 4.9 x 4.5 cm = volume: 100.2 mL. Renal
echogenicity within normal limits. No nephrolithiasis or
hydronephrosis. 1.8 x 1.7 x 1.8 cm simple cyst present at the
interpolar region.

Left Kidney:

Renal measurements: 9.0 x 5.4 x 5.0 cm = volume: 125.9 mL. Renal
echogenicity within normal limits. No nephrolithiasis or
hydronephrosis. No focal renal mass.

Bladder:

Decompressed with a Foley catheter in place and not well visualized.

Other:

None.
IMPRESSION: 1. No hydronephrosis or other acute abnormality.
2. 1.8 cm simple cyst at the interpolar right kidney.

## 2022-05-06 ENCOUNTER — Emergency Department
Admission: EM | Admit: 2022-05-06 | Discharge: 2022-05-06 | Disposition: A | Discharge status: Home / Self Care | Payer: 59 | Attending: Emergency Medicine | Admitting: Emergency Medicine

## 2022-05-06 ENCOUNTER — Other Ambulatory Visit: Payer: Self-pay

## 2022-05-06 DIAGNOSIS — W260XXD Contact with knife, subsequent encounter: Secondary | ICD-10-CM | POA: Diagnosis not present

## 2022-05-06 DIAGNOSIS — S61012D Laceration without foreign body of left thumb without damage to nail, subsequent encounter: Secondary | ICD-10-CM | POA: Diagnosis not present

## 2022-05-06 DIAGNOSIS — Z4802 Encounter for removal of sutures: Secondary | ICD-10-CM | POA: Diagnosis present

## 2022-05-06 NOTE — ED Triage Notes (Signed)
Pt to ED for suture removal to left thumb.

## 2022-05-06 NOTE — ED Provider Notes (Signed)
   [facility] Provider Note    Event Date/Time   First MD Initiated Contact with Patient 05/06/22 [phone]     (approximate)   History   Suture / Staple Removal   HPI  Melanie Lewis is a 88 y.o. female presents to the ED for suture removal.  Patient was seen in the emergency department after she cut her thumb on a paring knife.  She denies any difficulty or concerns of infection.     Physical Exam   Triage Vital Signs: ED Triage Vitals  Enc Vitals Group     BP 05/06/22 0822 (!) 159/93     Pulse Rate 05/06/22 0822 65     Resp 05/06/22 0822 18     Temp 05/06/22 0822 98.4 F (36.9 C)     Temp src --      SpO2 05/06/22 0822 97 %     Weight 05/06/22 0821 202 lb 13.2 oz (92 kg)     Height --      Head Circumference --      Peak Flow --      Pain Score 05/06/22 0821 0     Pain Loc --      Pain Edu? --      Excl. in GC? --     Most recent vital signs: Vitals:   05/06/22 0822  BP: (!) 159/93  Pulse: 65  Resp: 18  Temp: 98.4 F (36.9 C)  SpO2: 97%     General: Awake, no distress.  CV:  Good peripheral perfusion.  Resp:  Normal effort.  Abd:  No distention.  Other:  Inspection of the left thumb laceration site appears to be healing well without any signs of infection.  3 sutures are intact.   ED Results / Procedures / Treatments   Labs (all labs ordered are listed, but only abnormal results are displayed) Labs Reviewed - No data to display     PROCEDURES:  Critical Care performed:   Procedures   MEDICATIONS ORDERED IN ED: Medications - No data to display   IMPRESSION / MDM / ASSESSMENT AND PLAN / ED COURSE  I reviewed the triage vital signs and the nursing notes.   Differential diagnosis includes, but is not limited to,   88 year old female presents to the ED for suture removal.  No concerns for infection and area appears to be healing well without any erythema or drainage.  Sutures were removed by myself for total of  3.  Patient is to follow-up with her PCP if any continued problems or concerns.      Patient's presentation is most consistent with acute, uncomplicated illness.  FINAL CLINICAL IMPRESSION(S) / ED DIAGNOSES   Final diagnoses:  Encounter for removal of sutures     Rx / DC Orders   ED Discharge Orders     None        Note:  This document was prepared using Dragon voice recognition software and may include unintentional dictation errors.   Tommi Rumps, PA-C 05/06/22 0850    Georga Hacking, MD 05/06/22 [phone]

## 2022-05-06 NOTE — Discharge Instructions (Signed)
Keep the area clean and dry.  Steri-Strips were applied today will fall off on their own.

## 2022-05-17 IMAGING — CT CT HEAD W/O CM
4 of 5 series · 15 of 47 positions shown, 17 images · non-contrast
Comparison: Brain MRI 08/10/2019.

CLINICAL DATA: Mental status change, unknown cause. Additional
history provided: Atrial fibrillation with RVR and acute metabolic
encephalopathy.

EXAM:
CT HEAD WITHOUT CONTRAST
TECHNIQUE: Contiguous axial images were obtained from the base of the skull
through the vertex without intravenous contrast.

[Series 3: head wo · axial · 0.44mm/px · z∈[-184,-94]mm · 4 of 31 slices shown (1 of 2)]
[im 7/31  brain]
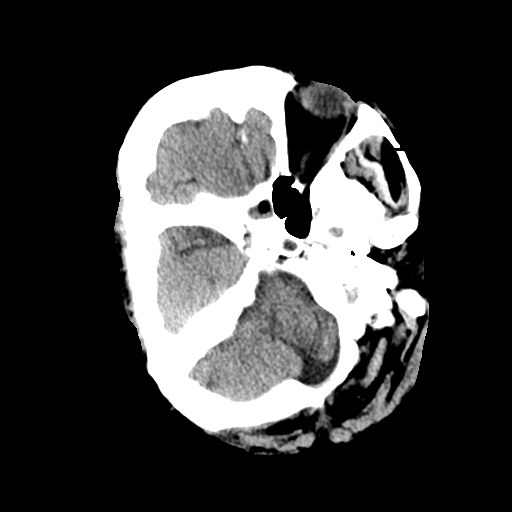
[im 13/31  brain]
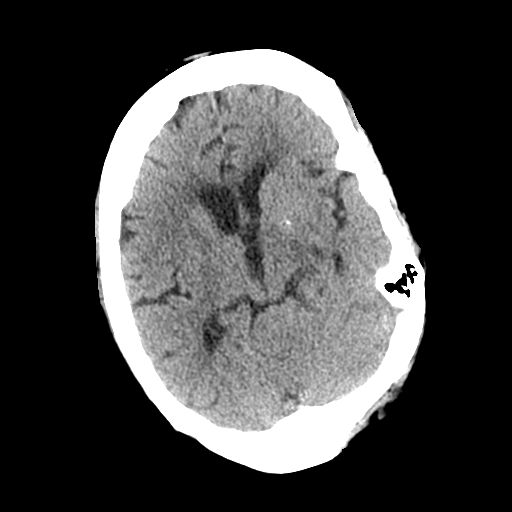
[im 19/31  brain]
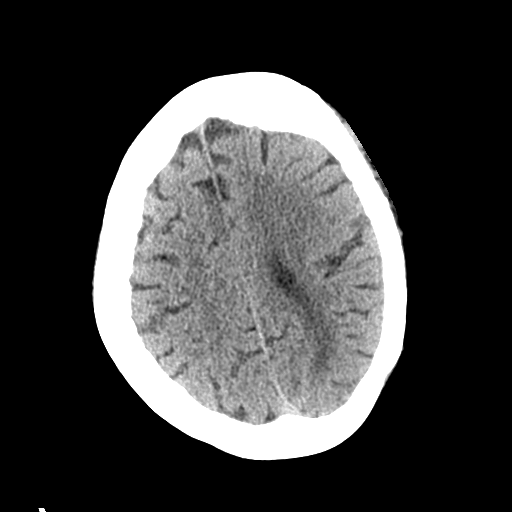
[im 25/31  brain]
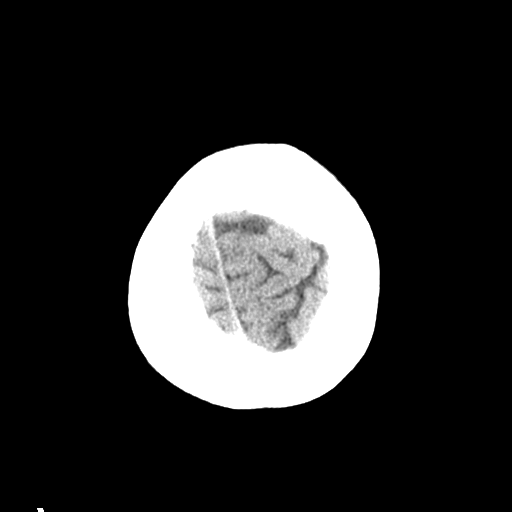

[Series 4: head wo · axial · 0.37mm/px · z∈[-267,-162]mm · 5 of 36 slices shown, 7 images (2 of 2)]
[im 6/36  brain]
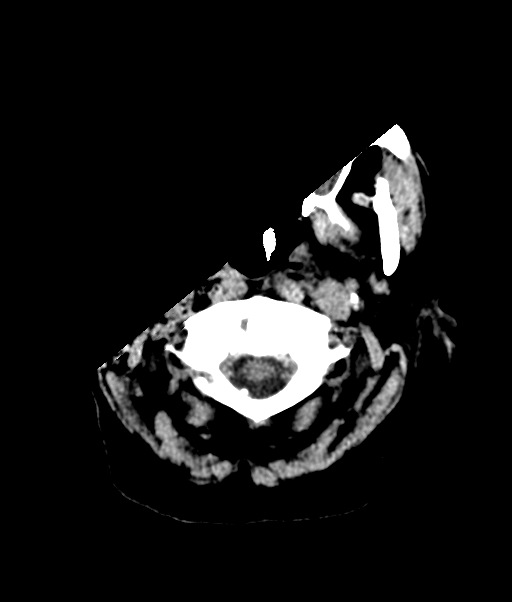
[im 6/36  bone]
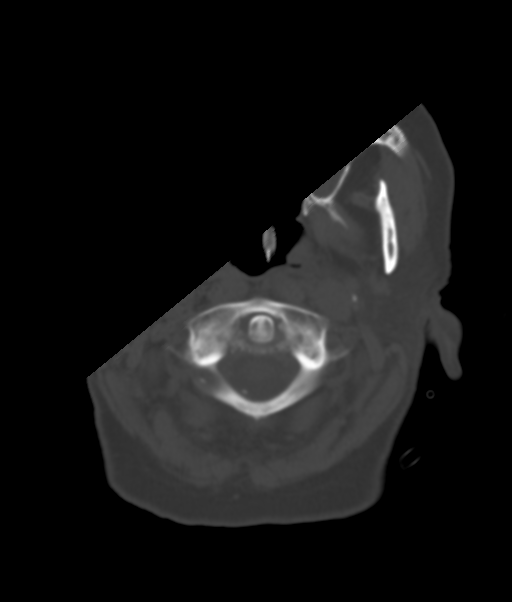
[im 12/36  brain]
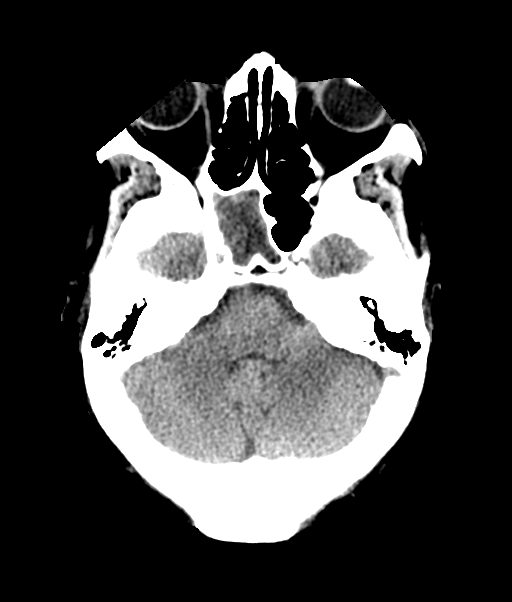
[im 18/36  brain]
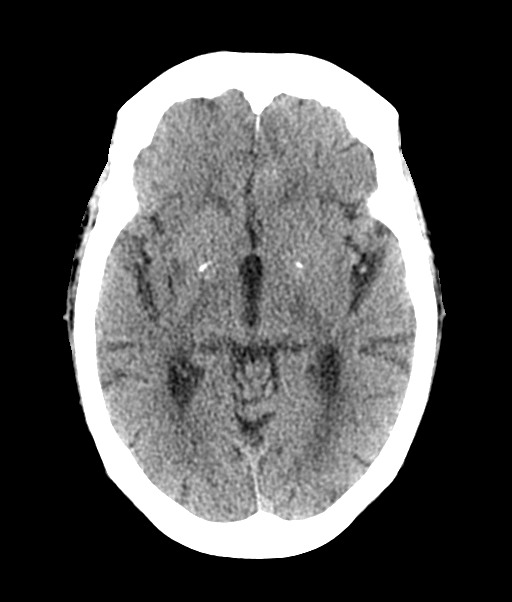
[im 24/36  brain]
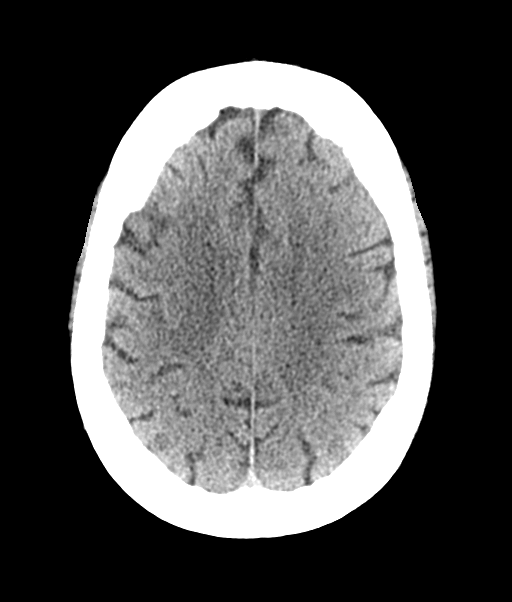
[im 30/36  brain]
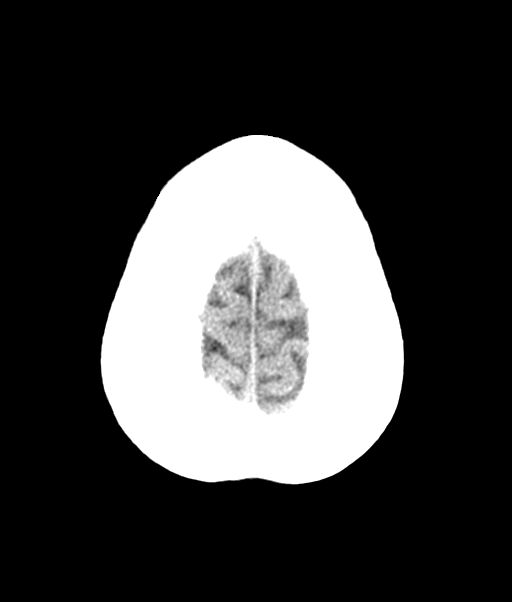
[im 30/36  bone]
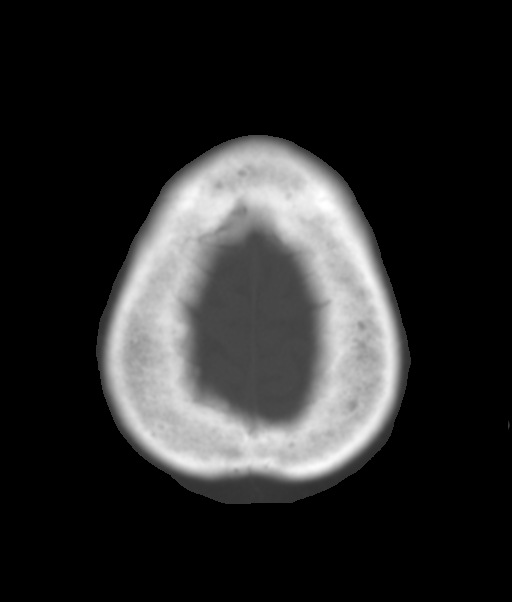

[Series 6: coronal soft tissue · coronal · 0.33mm/px · 3 of 65 slices shown]
[im 22/65  brain]
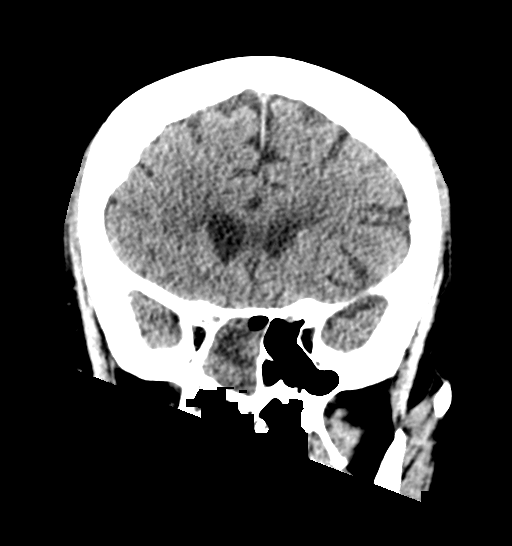
[im 29/65  brain]
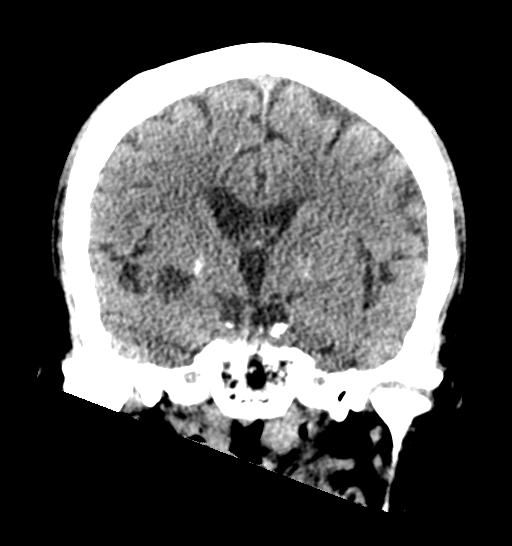
[im 36/65  brain]
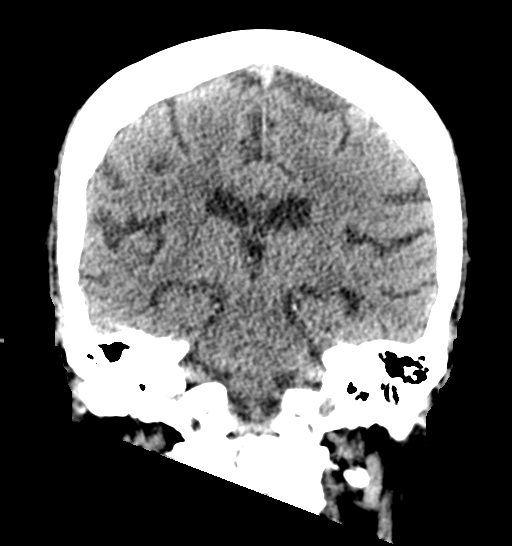

[Series 7: sagittal soft tissue · sagittal · 0.35mm/px · 3 of 50 slices shown]
[im 21/50  brain]
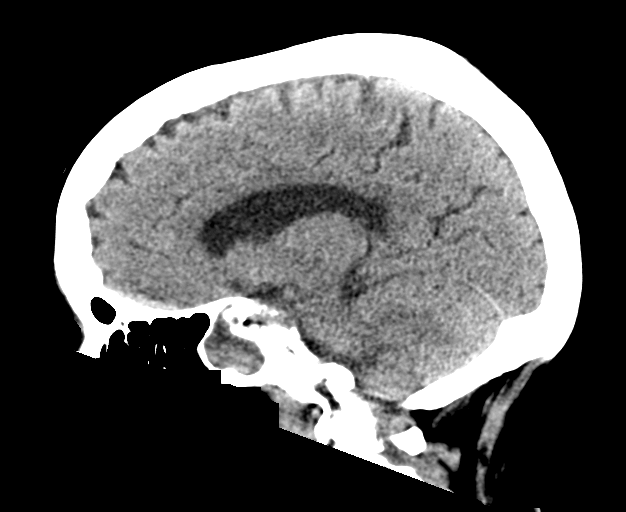
[im 25/50  brain]
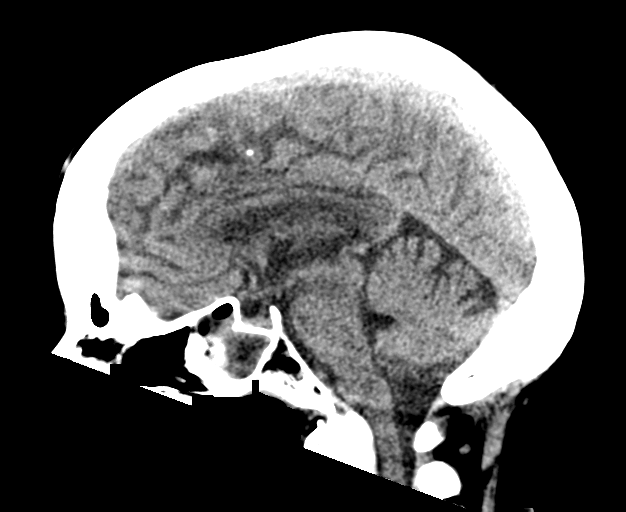
[im 29/50  brain]
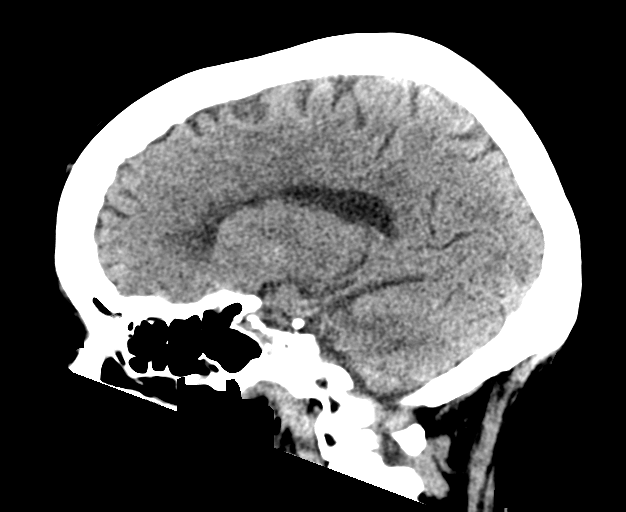

[15 of 47 positions shown; findings below may reference images not displayed]

FINDINGS: Brain:

Cerebral volume is normal for age.

There is no acute intracranial hemorrhage.

No demarcated cortical infarct is identified.

No extra-axial fluid collection.

No evidence of intracranial mass.

No midline shift.

Redemonstrated probable right choroid fissure cyst.

Vascular: No hyperdense vessel.  Atherosclerotic calcifications.

Skull: Normal. Negative for fracture or focal lesion.

Sinuses/Orbits: Visualized orbits show no acute finding. Paranasal
sinus disease. Most notably, there is near complete opacification of
the right sphenoid and left maxillary sinuses. No significant
mastoid effusion.
IMPRESSION: No CT evidence of acute intracranial abnormality.

Paranasal sinus disease as described. Most notably, there is near
complete opacification of the right sphenoid and left maxillary
sinuses.
